# Patient Record
Sex: Female | Born: 1950 | Race: White | Hispanic: No | Marital: Married | Smoking: Former smoker
Health system: Southern US, Community
[De-identification: ages and names within clinical notes are randomized; demographics above are authoritative.]

## PROBLEM LIST (undated history)

## (undated) DIAGNOSIS — J690 Pneumonitis due to inhalation of food and vomit: Secondary | ICD-10-CM

## (undated) DIAGNOSIS — J9601 Acute respiratory failure with hypoxia: Secondary | ICD-10-CM

## (undated) DIAGNOSIS — J309 Allergic rhinitis, unspecified: Secondary | ICD-10-CM

## (undated) DIAGNOSIS — R413 Other amnesia: Secondary | ICD-10-CM

## (undated) DIAGNOSIS — M8580 Other specified disorders of bone density and structure, unspecified site: Secondary | ICD-10-CM

## (undated) DIAGNOSIS — F329 Major depressive disorder, single episode, unspecified: Secondary | ICD-10-CM

## (undated) DIAGNOSIS — E079 Disorder of thyroid, unspecified: Secondary | ICD-10-CM

## (undated) DIAGNOSIS — F332 Major depressive disorder, recurrent severe without psychotic features: Secondary | ICD-10-CM

## (undated) DIAGNOSIS — F1011 Alcohol abuse, in remission: Secondary | ICD-10-CM

## (undated) DIAGNOSIS — D72829 Elevated white blood cell count, unspecified: Secondary | ICD-10-CM

## (undated) DIAGNOSIS — R519 Headache, unspecified: Secondary | ICD-10-CM

## (undated) DIAGNOSIS — K5792 Diverticulitis of intestine, part unspecified, without perforation or abscess without bleeding: Secondary | ICD-10-CM

## (undated) DIAGNOSIS — D649 Anemia, unspecified: Secondary | ICD-10-CM

## (undated) DIAGNOSIS — D62 Acute posthemorrhagic anemia: Secondary | ICD-10-CM

## (undated) HISTORY — DX: Allergic rhinitis, unspecified: J30.9

## (undated) HISTORY — DX: Elevated white blood cell count, unspecified: D72.829

## (undated) HISTORY — DX: Other specified disorders of bone density and structure, unspecified site: M85.80

## (undated) HISTORY — PX: APPENDECTOMY: SHX54

## (undated) HISTORY — DX: Diverticulitis of intestine, part unspecified, without perforation or abscess without bleeding: K57.92

## (undated) HISTORY — DX: Alcohol abuse, in remission: F10.11

## (undated) HISTORY — DX: Major depressive disorder, recurrent severe without psychotic features: F33.2

## (undated) HISTORY — DX: Other amnesia: R41.3

## (undated) HISTORY — PX: TONSILLECTOMY: SUR1361

## (undated) HISTORY — DX: Pneumonitis due to inhalation of food and vomit: J69.0

## (undated) HISTORY — PX: GANGLION CYST EXCISION: SHX1691

## (undated) HISTORY — DX: Acute respiratory failure with hypoxia: J96.01

## (undated) HISTORY — PX: COLON RESECTION: SHX5231

## (undated) HISTORY — DX: Headache, unspecified: R51.9

## (undated) HISTORY — DX: Disorder of thyroid, unspecified: E07.9

## (undated) HISTORY — PX: COLONOSCOPY: SHX174

## (undated) HISTORY — DX: Major depressive disorder, single episode, unspecified: F32.9

---

## 1997-12-21 ENCOUNTER — Ambulatory Visit (HOSPITAL_COMMUNITY): Admission: RE | Admit: 1997-12-21 | Discharge: 1997-12-21 | Payer: Self-pay | Admitting: *Deleted

## 1997-12-26 ENCOUNTER — Ambulatory Visit (HOSPITAL_COMMUNITY): Admission: RE | Admit: 1997-12-26 | Discharge: 1997-12-26 | Payer: Self-pay | Admitting: *Deleted

## 1999-01-05 ENCOUNTER — Ambulatory Visit (HOSPITAL_COMMUNITY): Admission: RE | Admit: 1999-01-05 | Discharge: 1999-01-05 | Payer: Self-pay | Admitting: *Deleted

## 1999-01-12 ENCOUNTER — Other Ambulatory Visit: Admission: RE | Admit: 1999-01-12 | Discharge: 1999-01-12 | Payer: Self-pay | Admitting: *Deleted

## 1999-08-17 ENCOUNTER — Ambulatory Visit (HOSPITAL_BASED_OUTPATIENT_CLINIC_OR_DEPARTMENT_OTHER): Admission: RE | Admit: 1999-08-17 | Discharge: 1999-08-17 | Payer: Self-pay | Admitting: Orthopedic Surgery

## 2000-01-07 ENCOUNTER — Encounter: Payer: Self-pay | Admitting: *Deleted

## 2000-01-07 ENCOUNTER — Ambulatory Visit (HOSPITAL_COMMUNITY): Admission: RE | Admit: 2000-01-07 | Discharge: 2000-01-07 | Payer: Self-pay | Admitting: *Deleted

## 2000-01-28 ENCOUNTER — Other Ambulatory Visit: Admission: RE | Admit: 2000-01-28 | Discharge: 2000-01-28 | Payer: Self-pay | Admitting: *Deleted

## 2001-01-08 ENCOUNTER — Ambulatory Visit (HOSPITAL_COMMUNITY): Admission: RE | Admit: 2001-01-08 | Discharge: 2001-01-08 | Payer: Self-pay | Admitting: *Deleted

## 2001-01-08 ENCOUNTER — Encounter: Payer: Self-pay | Admitting: *Deleted

## 2001-01-28 ENCOUNTER — Other Ambulatory Visit: Admission: RE | Admit: 2001-01-28 | Discharge: 2001-01-28 | Payer: Self-pay | Admitting: *Deleted

## 2001-01-29 ENCOUNTER — Encounter: Payer: Self-pay | Admitting: Family Medicine

## 2001-01-29 ENCOUNTER — Encounter: Admission: RE | Admit: 2001-01-29 | Discharge: 2001-01-29 | Payer: Self-pay | Admitting: Family Medicine

## 2002-02-02 ENCOUNTER — Encounter: Payer: Self-pay | Admitting: *Deleted

## 2002-02-02 ENCOUNTER — Ambulatory Visit (HOSPITAL_COMMUNITY): Admission: RE | Admit: 2002-02-02 | Discharge: 2002-02-02 | Payer: Self-pay | Admitting: *Deleted

## 2002-03-25 ENCOUNTER — Other Ambulatory Visit: Admission: RE | Admit: 2002-03-25 | Discharge: 2002-03-25 | Payer: Self-pay | Admitting: *Deleted

## 2002-03-31 ENCOUNTER — Encounter: Admission: RE | Admit: 2002-03-31 | Discharge: 2002-03-31 | Payer: Self-pay | Admitting: *Deleted

## 2002-03-31 ENCOUNTER — Encounter: Payer: Self-pay | Admitting: *Deleted

## 2002-07-21 ENCOUNTER — Encounter: Payer: Self-pay | Admitting: Gastroenterology

## 2002-07-21 ENCOUNTER — Encounter: Admission: RE | Admit: 2002-07-21 | Discharge: 2002-07-21 | Payer: Self-pay | Admitting: Gastroenterology

## 2003-02-04 ENCOUNTER — Ambulatory Visit (HOSPITAL_COMMUNITY): Admission: RE | Admit: 2003-02-04 | Discharge: 2003-02-04 | Payer: Self-pay | Admitting: *Deleted

## 2003-02-04 ENCOUNTER — Encounter: Payer: Self-pay | Admitting: *Deleted

## 2003-04-06 ENCOUNTER — Other Ambulatory Visit: Admission: RE | Admit: 2003-04-06 | Discharge: 2003-04-06 | Payer: Self-pay | Admitting: Family Medicine

## 2004-03-14 ENCOUNTER — Ambulatory Visit (HOSPITAL_COMMUNITY): Admission: RE | Admit: 2004-03-14 | Discharge: 2004-03-14 | Payer: Self-pay | Admitting: Family Medicine

## 2004-04-11 ENCOUNTER — Other Ambulatory Visit: Admission: RE | Admit: 2004-04-11 | Discharge: 2004-04-11 | Payer: Self-pay | Admitting: Family Medicine

## 2004-05-21 ENCOUNTER — Emergency Department (HOSPITAL_COMMUNITY): Admission: EM | Admit: 2004-05-21 | Discharge: 2004-05-21 | Payer: Self-pay | Admitting: Emergency Medicine

## 2004-06-19 ENCOUNTER — Ambulatory Visit (HOSPITAL_COMMUNITY): Admission: RE | Admit: 2004-06-19 | Discharge: 2004-06-19 | Payer: Self-pay | Admitting: Gastroenterology

## 2005-03-19 ENCOUNTER — Ambulatory Visit (HOSPITAL_COMMUNITY): Admission: RE | Admit: 2005-03-19 | Discharge: 2005-03-19 | Payer: Self-pay | Admitting: Family Medicine

## 2005-05-01 ENCOUNTER — Other Ambulatory Visit: Admission: RE | Admit: 2005-05-01 | Discharge: 2005-05-01 | Payer: Self-pay | Admitting: Family Medicine

## 2005-08-09 ENCOUNTER — Ambulatory Visit (HOSPITAL_COMMUNITY): Admission: RE | Admit: 2005-08-09 | Discharge: 2005-08-09 | Payer: Self-pay | Admitting: Family Medicine

## 2005-12-11 ENCOUNTER — Encounter: Payer: Self-pay | Admitting: *Deleted

## 2006-03-24 ENCOUNTER — Ambulatory Visit (HOSPITAL_COMMUNITY): Admission: RE | Admit: 2006-03-24 | Discharge: 2006-03-24 | Payer: Self-pay | Admitting: Family Medicine

## 2006-09-03 ENCOUNTER — Other Ambulatory Visit: Admission: RE | Admit: 2006-09-03 | Discharge: 2006-09-03 | Payer: Self-pay | Admitting: Family Medicine

## 2006-09-09 ENCOUNTER — Ambulatory Visit (HOSPITAL_COMMUNITY): Admission: RE | Admit: 2006-09-09 | Discharge: 2006-09-09 | Payer: Self-pay | Admitting: Family Medicine

## 2007-03-27 ENCOUNTER — Ambulatory Visit (HOSPITAL_COMMUNITY): Admission: RE | Admit: 2007-03-27 | Discharge: 2007-03-27 | Payer: Self-pay | Admitting: Family Medicine

## 2007-04-02 ENCOUNTER — Encounter: Admission: RE | Admit: 2007-04-02 | Discharge: 2007-04-02 | Payer: Self-pay | Admitting: Family Medicine

## 2007-09-10 ENCOUNTER — Other Ambulatory Visit: Admission: RE | Admit: 2007-09-10 | Discharge: 2007-09-10 | Payer: Self-pay | Admitting: Family Medicine

## 2008-04-05 ENCOUNTER — Ambulatory Visit (HOSPITAL_COMMUNITY): Admission: RE | Admit: 2008-04-05 | Discharge: 2008-04-05 | Payer: Self-pay | Admitting: Family Medicine

## 2008-04-15 ENCOUNTER — Encounter: Admission: RE | Admit: 2008-04-15 | Discharge: 2008-04-15 | Payer: Self-pay | Admitting: Family Medicine

## 2008-10-26 ENCOUNTER — Other Ambulatory Visit: Admission: RE | Admit: 2008-10-26 | Discharge: 2008-10-26 | Payer: Self-pay | Admitting: Family Medicine

## 2009-05-16 ENCOUNTER — Ambulatory Visit (HOSPITAL_COMMUNITY): Admission: RE | Admit: 2009-05-16 | Discharge: 2009-05-16 | Payer: Self-pay | Admitting: Family Medicine

## 2010-06-11 ENCOUNTER — Ambulatory Visit (HOSPITAL_COMMUNITY): Admission: RE | Admit: 2010-06-11 | Discharge: 2010-06-11 | Payer: Self-pay | Admitting: Family Medicine

## 2010-09-01 ENCOUNTER — Encounter: Payer: Self-pay | Admitting: Sports Medicine

## 2010-09-02 ENCOUNTER — Encounter: Payer: Self-pay | Admitting: Family Medicine

## 2010-09-03 ENCOUNTER — Encounter: Payer: Self-pay | Admitting: Family Medicine

## 2010-12-28 NOTE — Op Note (Signed)
Courtney Beard, Courtney Beard          ACCOUNT NO.:  [id number]   MEDICAL RECORD NO.:  [id number]          PATIENT TYPE:  AMB   LOCATION:  ENDO                         FACILITY:  [facility]   PHYSICIAN:  Petra Kuba, M.D.    DATE OF BIRTH:  03/02/1951   DATE OF PROCEDURE:  06/19/2004  DATE OF DISCHARGE:                                 OPERATIVE REPORT   PROCEDURE:  Colonoscopy.   INDICATIONS:  Screening. Consent was signed after risks, benefits, methods,  and options thoroughly discussed in the office.   MEDICINES USED:  Fentanyl 100 mcg, Versed 10 mg.   PROCEDURE NOTE:  Rectal inspection pertinent for external hemorrhoids.  Digital exam was negative. Pediatric video adjustable colonoscope was  inserted and with lots of difficulty due to tortuous sigmoid. Once through  this area, was easily to advance around the colon to the colon end. 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 No other ways of  advancing were seen. We rolled her on her back and even on her right side to  try to see if that would open up the terminal ileum but were unsuccessful,  although there was no obvious other loop or curve to advance to. The scope  was slowly withdrawn. No abnormalities were seen other than a right left  sided diverticula. We slowly withdrew back to the rectum, and there was no  signs of any other anastomosis. She did have a very tortuous sigmoid which  made visualization difficult in the area. Anorectal pull through and  retroflexion confirmed some small hemorrhoids. The scope was straightened.  Air was suctioned. Scope removed. The patient tolerated the procedure well.  There were no obvious immediate complications.   ENDOSCOPIC DIAGNOSES:  1.  Internal and external hemorrhoids.  2.  Rare left sided diverticula.  3.  Tortuous sigmoid.  4.  Otherwise within normal limits to the colon end, we  believe, probably      was anastomosis, seemed to be some small bowel mucosa.   PLAN:  Yearly rectals and guaiacs per Dr. Katrinka Blazing. Happy to see back p.r.n.  Repeat screening needed in 5 years. Might consider a virtual colonoscopy at  that junction to better delineate the curves.      MEM/MEDQ  D:  06/19/2004  T:  06/19/2004  Job:  [id number]   cc:   Dario Guardian, M.D.  [address]  [place]  [place] [address]  Fax: [phone]

## 2011-05-31 ENCOUNTER — Other Ambulatory Visit (HOSPITAL_COMMUNITY): Payer: Self-pay | Admitting: Family Medicine

## 2011-05-31 DIAGNOSIS — Z1231 Encounter for screening mammogram for malignant neoplasm of breast: Secondary | ICD-10-CM

## 2011-06-26 ENCOUNTER — Ambulatory Visit (HOSPITAL_COMMUNITY): Payer: Self-pay

## 2011-06-27 ENCOUNTER — Ambulatory Visit (HOSPITAL_COMMUNITY): Payer: Self-pay

## 2012-10-06 ENCOUNTER — Other Ambulatory Visit (HOSPITAL_COMMUNITY): Payer: Self-pay | Admitting: Family Medicine

## 2012-10-06 DIAGNOSIS — Z1231 Encounter for screening mammogram for malignant neoplasm of breast: Secondary | ICD-10-CM

## 2012-10-14 ENCOUNTER — Ambulatory Visit (HOSPITAL_COMMUNITY)
Admission: RE | Admit: 2012-10-14 | Discharge: 2012-10-14 | Disposition: A | Discharge status: Home / Self Care | Payer: 59 | Source: Ambulatory Visit | Attending: Family Medicine | Admitting: Family Medicine

## 2012-10-14 DIAGNOSIS — Z1231 Encounter for screening mammogram for malignant neoplasm of breast: Secondary | ICD-10-CM

## 2013-08-16 ENCOUNTER — Other Ambulatory Visit: Payer: Self-pay | Admitting: Family Medicine

## 2013-08-16 ENCOUNTER — Other Ambulatory Visit (HOSPITAL_COMMUNITY)
Admission: RE | Admit: 2013-08-16 | Discharge: 2013-08-16 | Disposition: A | Discharge status: Home / Self Care | Payer: Managed Care, Other (non HMO) | Source: Ambulatory Visit | Attending: Family Medicine | Admitting: Family Medicine

## 2013-08-16 DIAGNOSIS — Z0000 Encounter for general adult medical examination without abnormal findings: Secondary | ICD-10-CM | POA: Insufficient documentation

## 2013-12-06 ENCOUNTER — Other Ambulatory Visit (HOSPITAL_COMMUNITY): Payer: Self-pay | Admitting: Family Medicine

## 2013-12-06 DIAGNOSIS — Z1231 Encounter for screening mammogram for malignant neoplasm of breast: Secondary | ICD-10-CM

## 2013-12-15 ENCOUNTER — Ambulatory Visit (HOSPITAL_COMMUNITY)
Admission: RE | Admit: 2013-12-15 | Discharge: 2013-12-15 | Disposition: A | Discharge status: Home / Self Care | Payer: Managed Care, Other (non HMO) | Source: Ambulatory Visit | Attending: Family Medicine | Admitting: Family Medicine

## 2013-12-15 ENCOUNTER — Other Ambulatory Visit (HOSPITAL_COMMUNITY): Payer: Self-pay | Admitting: Family Medicine

## 2013-12-15 DIAGNOSIS — Z1231 Encounter for screening mammogram for malignant neoplasm of breast: Secondary | ICD-10-CM

## 2014-11-30 ENCOUNTER — Other Ambulatory Visit (HOSPITAL_COMMUNITY): Payer: Self-pay | Admitting: Family Medicine

## 2014-11-30 DIAGNOSIS — Z1231 Encounter for screening mammogram for malignant neoplasm of breast: Secondary | ICD-10-CM

## 2014-12-20 ENCOUNTER — Ambulatory Visit (HOSPITAL_COMMUNITY)
Admission: RE | Admit: 2014-12-20 | Discharge: 2014-12-20 | Disposition: A | Discharge status: Home / Self Care | Payer: Managed Care, Other (non HMO) | Source: Ambulatory Visit | Attending: Family Medicine | Admitting: Family Medicine

## 2014-12-20 DIAGNOSIS — Z1231 Encounter for screening mammogram for malignant neoplasm of breast: Secondary | ICD-10-CM

## 2015-12-07 ENCOUNTER — Other Ambulatory Visit: Payer: Self-pay

## 2015-12-07 DIAGNOSIS — Z1231 Encounter for screening mammogram for malignant neoplasm of breast: Secondary | ICD-10-CM

## 2015-12-21 ENCOUNTER — Ambulatory Visit
Admission: RE | Admit: 2015-12-21 | Discharge: 2015-12-21 | Disposition: A | Discharge status: Home / Self Care | Payer: Managed Care, Other (non HMO) | Source: Ambulatory Visit

## 2015-12-21 DIAGNOSIS — Z1231 Encounter for screening mammogram for malignant neoplasm of breast: Secondary | ICD-10-CM

## 2016-12-10 ENCOUNTER — Other Ambulatory Visit (HOSPITAL_COMMUNITY)
Admission: RE | Admit: 2016-12-10 | Discharge: 2016-12-10 | Disposition: A | Discharge status: Home / Self Care | Payer: Managed Care, Other (non HMO) | Source: Ambulatory Visit | Attending: Family Medicine | Admitting: Family Medicine

## 2016-12-10 ENCOUNTER — Other Ambulatory Visit: Payer: Self-pay | Admitting: Family Medicine

## 2016-12-10 DIAGNOSIS — Z124 Encounter for screening for malignant neoplasm of cervix: Secondary | ICD-10-CM | POA: Insufficient documentation

## 2016-12-12 LAB — CYTOLOGY - PAP: Diagnosis: NEGATIVE

## 2016-12-20 ENCOUNTER — Other Ambulatory Visit: Payer: Self-pay | Admitting: Family Medicine

## 2016-12-20 DIAGNOSIS — Z1231 Encounter for screening mammogram for malignant neoplasm of breast: Secondary | ICD-10-CM

## 2016-12-25 ENCOUNTER — Ambulatory Visit
Admission: RE | Admit: 2016-12-25 | Discharge: 2016-12-25 | Disposition: A | Discharge status: Home / Self Care | Payer: Managed Care, Other (non HMO) | Source: Ambulatory Visit | Attending: Family Medicine | Admitting: Family Medicine

## 2016-12-25 DIAGNOSIS — Z1231 Encounter for screening mammogram for malignant neoplasm of breast: Secondary | ICD-10-CM

## 2017-12-04 ENCOUNTER — Other Ambulatory Visit: Payer: Self-pay | Admitting: Family Medicine

## 2017-12-04 DIAGNOSIS — Z1231 Encounter for screening mammogram for malignant neoplasm of breast: Secondary | ICD-10-CM

## 2017-12-30 ENCOUNTER — Ambulatory Visit
Admission: RE | Admit: 2017-12-30 | Discharge: 2017-12-30 | Disposition: A | Discharge status: Home / Self Care | Payer: Medicare HMO | Source: Ambulatory Visit | Attending: Family Medicine | Admitting: Family Medicine

## 2017-12-30 DIAGNOSIS — Z1231 Encounter for screening mammogram for malignant neoplasm of breast: Secondary | ICD-10-CM | POA: Diagnosis not present

## 2018-01-20 DIAGNOSIS — F419 Anxiety disorder, unspecified: Secondary | ICD-10-CM | POA: Diagnosis not present

## 2018-01-20 DIAGNOSIS — R69 Illness, unspecified: Secondary | ICD-10-CM | POA: Diagnosis not present

## 2018-04-02 DIAGNOSIS — R69 Illness, unspecified: Secondary | ICD-10-CM | POA: Diagnosis not present

## 2018-04-02 DIAGNOSIS — Z1389 Encounter for screening for other disorder: Secondary | ICD-10-CM | POA: Diagnosis not present

## 2018-04-02 DIAGNOSIS — Z23 Encounter for immunization: Secondary | ICD-10-CM | POA: Diagnosis not present

## 2018-04-02 DIAGNOSIS — E7800 Pure hypercholesterolemia, unspecified: Secondary | ICD-10-CM | POA: Diagnosis not present

## 2018-04-02 DIAGNOSIS — M859 Disorder of bone density and structure, unspecified: Secondary | ICD-10-CM | POA: Diagnosis not present

## 2018-04-02 DIAGNOSIS — Z0000 Encounter for general adult medical examination without abnormal findings: Secondary | ICD-10-CM | POA: Diagnosis not present

## 2018-05-19 DIAGNOSIS — R69 Illness, unspecified: Secondary | ICD-10-CM | POA: Diagnosis not present

## 2018-05-23 ENCOUNTER — Inpatient Hospital Stay (HOSPITAL_COMMUNITY): Payer: Medicare HMO

## 2018-05-23 ENCOUNTER — Emergency Department (HOSPITAL_COMMUNITY): Payer: Medicare HMO

## 2018-05-23 ENCOUNTER — Other Ambulatory Visit: Payer: Self-pay

## 2018-05-23 ENCOUNTER — Inpatient Hospital Stay (HOSPITAL_COMMUNITY)
Admission: EM | Admit: 2018-05-23 | Discharge: 2018-06-19 | DRG: 917 | Disposition: A | Discharge status: Rehabilitation Facility | Payer: Medicare HMO | Attending: Internal Medicine | Admitting: Internal Medicine

## 2018-05-23 DIAGNOSIS — F13239 Sedative, hypnotic or anxiolytic dependence with withdrawal, unspecified: Secondary | ICD-10-CM | POA: Diagnosis not present

## 2018-05-23 DIAGNOSIS — A419 Sepsis, unspecified organism: Secondary | ICD-10-CM | POA: Diagnosis not present

## 2018-05-23 DIAGNOSIS — G92 Toxic encephalopathy: Secondary | ICD-10-CM | POA: Diagnosis present

## 2018-05-23 DIAGNOSIS — F419 Anxiety disorder, unspecified: Secondary | ICD-10-CM | POA: Diagnosis present

## 2018-05-23 DIAGNOSIS — J9600 Acute respiratory failure, unspecified whether with hypoxia or hypercapnia: Secondary | ICD-10-CM | POA: Diagnosis not present

## 2018-05-23 DIAGNOSIS — E874 Mixed disorder of acid-base balance: Secondary | ICD-10-CM | POA: Diagnosis present

## 2018-05-23 DIAGNOSIS — E875 Hyperkalemia: Secondary | ICD-10-CM | POA: Diagnosis present

## 2018-05-23 DIAGNOSIS — J690 Pneumonitis due to inhalation of food and vomit: Secondary | ICD-10-CM

## 2018-05-23 DIAGNOSIS — F329 Major depressive disorder, single episode, unspecified: Secondary | ICD-10-CM | POA: Diagnosis not present

## 2018-05-23 DIAGNOSIS — J984 Other disorders of lung: Secondary | ICD-10-CM | POA: Diagnosis not present

## 2018-05-23 DIAGNOSIS — Z781 Physical restraint status: Secondary | ICD-10-CM

## 2018-05-23 DIAGNOSIS — G9341 Metabolic encephalopathy: Secondary | ICD-10-CM | POA: Diagnosis not present

## 2018-05-23 DIAGNOSIS — T50902A Poisoning by unspecified drugs, medicaments and biological substances, intentional self-harm, initial encounter: Secondary | ICD-10-CM | POA: Diagnosis not present

## 2018-05-23 DIAGNOSIS — Z0189 Encounter for other specified special examinations: Secondary | ICD-10-CM

## 2018-05-23 DIAGNOSIS — J9690 Respiratory failure, unspecified, unspecified whether with hypoxia or hypercapnia: Secondary | ICD-10-CM | POA: Diagnosis not present

## 2018-05-23 DIAGNOSIS — R404 Transient alteration of awareness: Secondary | ICD-10-CM | POA: Diagnosis not present

## 2018-05-23 DIAGNOSIS — T510X2A Toxic effect of ethanol, intentional self-harm, initial encounter: Secondary | ICD-10-CM | POA: Diagnosis present

## 2018-05-23 DIAGNOSIS — I469 Cardiac arrest, cause unspecified: Secondary | ICD-10-CM

## 2018-05-23 DIAGNOSIS — R0689 Other abnormalities of breathing: Secondary | ICD-10-CM | POA: Diagnosis not present

## 2018-05-23 DIAGNOSIS — R1310 Dysphagia, unspecified: Secondary | ICD-10-CM | POA: Diagnosis not present

## 2018-05-23 DIAGNOSIS — R4020 Unspecified coma: Secondary | ICD-10-CM | POA: Diagnosis not present

## 2018-05-23 DIAGNOSIS — Z452 Encounter for adjustment and management of vascular access device: Secondary | ICD-10-CM

## 2018-05-23 DIAGNOSIS — Z4682 Encounter for fitting and adjustment of non-vascular catheter: Secondary | ICD-10-CM | POA: Diagnosis not present

## 2018-05-23 DIAGNOSIS — I468 Cardiac arrest due to other underlying condition: Secondary | ICD-10-CM | POA: Diagnosis present

## 2018-05-23 DIAGNOSIS — R6521 Severe sepsis with septic shock: Secondary | ICD-10-CM | POA: Diagnosis present

## 2018-05-23 DIAGNOSIS — T17908S Unspecified foreign body in respiratory tract, part unspecified causing other injury, sequela: Secondary | ICD-10-CM

## 2018-05-23 DIAGNOSIS — R69 Illness, unspecified: Secondary | ICD-10-CM | POA: Diagnosis not present

## 2018-05-23 DIAGNOSIS — R918 Other nonspecific abnormal finding of lung field: Secondary | ICD-10-CM | POA: Diagnosis not present

## 2018-05-23 DIAGNOSIS — T428X2A Poisoning by antiparkinsonism drugs and other central muscle-tone depressants, intentional self-harm, initial encounter: Secondary | ICD-10-CM | POA: Diagnosis not present

## 2018-05-23 DIAGNOSIS — G931 Anoxic brain damage, not elsewhere classified: Secondary | ICD-10-CM | POA: Diagnosis not present

## 2018-05-23 DIAGNOSIS — R1312 Dysphagia, oropharyngeal phase: Secondary | ICD-10-CM | POA: Diagnosis not present

## 2018-05-23 DIAGNOSIS — D72829 Elevated white blood cell count, unspecified: Secondary | ICD-10-CM | POA: Diagnosis not present

## 2018-05-23 DIAGNOSIS — R000 Tachycardia, unspecified: Secondary | ICD-10-CM | POA: Diagnosis not present

## 2018-05-23 DIAGNOSIS — Y92003 Bedroom of unspecified non-institutional (private) residence as the place of occurrence of the external cause: Secondary | ICD-10-CM | POA: Diagnosis not present

## 2018-05-23 DIAGNOSIS — F10239 Alcohol dependence with withdrawal, unspecified: Secondary | ICD-10-CM | POA: Diagnosis not present

## 2018-05-23 DIAGNOSIS — E1165 Type 2 diabetes mellitus with hyperglycemia: Secondary | ICD-10-CM | POA: Diagnosis not present

## 2018-05-23 DIAGNOSIS — T424X2A Poisoning by benzodiazepines, intentional self-harm, initial encounter: Secondary | ICD-10-CM | POA: Diagnosis not present

## 2018-05-23 DIAGNOSIS — J95851 Ventilator associated pneumonia: Secondary | ICD-10-CM | POA: Diagnosis not present

## 2018-05-23 DIAGNOSIS — F1010 Alcohol abuse, uncomplicated: Secondary | ICD-10-CM | POA: Diagnosis not present

## 2018-05-23 DIAGNOSIS — D649 Anemia, unspecified: Secondary | ICD-10-CM | POA: Diagnosis present

## 2018-05-23 DIAGNOSIS — Y903 Blood alcohol level of 60-79 mg/100 ml: Secondary | ICD-10-CM | POA: Diagnosis present

## 2018-05-23 DIAGNOSIS — T1491XA Suicide attempt, initial encounter: Secondary | ICD-10-CM | POA: Diagnosis not present

## 2018-05-23 DIAGNOSIS — J189 Pneumonia, unspecified organism: Secondary | ICD-10-CM | POA: Diagnosis not present

## 2018-05-23 DIAGNOSIS — T50904A Poisoning by unspecified drugs, medicaments and biological substances, undetermined, initial encounter: Secondary | ICD-10-CM | POA: Diagnosis not present

## 2018-05-23 DIAGNOSIS — F332 Major depressive disorder, recurrent severe without psychotic features: Secondary | ICD-10-CM | POA: Diagnosis not present

## 2018-05-23 DIAGNOSIS — E878 Other disorders of electrolyte and fluid balance, not elsewhere classified: Secondary | ICD-10-CM | POA: Diagnosis present

## 2018-05-23 DIAGNOSIS — Z9114 Patient's other noncompliance with medication regimen: Secondary | ICD-10-CM

## 2018-05-23 DIAGNOSIS — E870 Hyperosmolality and hypernatremia: Secondary | ICD-10-CM | POA: Diagnosis not present

## 2018-05-23 DIAGNOSIS — T17908A Unspecified foreign body in respiratory tract, part unspecified causing other injury, initial encounter: Secondary | ICD-10-CM | POA: Diagnosis not present

## 2018-05-23 DIAGNOSIS — D62 Acute posthemorrhagic anemia: Secondary | ICD-10-CM

## 2018-05-23 DIAGNOSIS — R197 Diarrhea, unspecified: Secondary | ICD-10-CM | POA: Diagnosis not present

## 2018-05-23 DIAGNOSIS — R0602 Shortness of breath: Secondary | ICD-10-CM | POA: Diagnosis not present

## 2018-05-23 DIAGNOSIS — Z4659 Encounter for fitting and adjustment of other gastrointestinal appliance and device: Secondary | ICD-10-CM

## 2018-05-23 DIAGNOSIS — F19939 Other psychoactive substance use, unspecified with withdrawal, unspecified: Secondary | ICD-10-CM | POA: Diagnosis not present

## 2018-05-23 DIAGNOSIS — J80 Acute respiratory distress syndrome: Secondary | ICD-10-CM

## 2018-05-23 DIAGNOSIS — Z9911 Dependence on respirator [ventilator] status: Secondary | ICD-10-CM

## 2018-05-23 DIAGNOSIS — R0989 Other specified symptoms and signs involving the circulatory and respiratory systems: Secondary | ICD-10-CM

## 2018-05-23 DIAGNOSIS — E876 Hypokalemia: Secondary | ICD-10-CM | POA: Diagnosis present

## 2018-05-23 DIAGNOSIS — J9601 Acute respiratory failure with hypoxia: Secondary | ICD-10-CM

## 2018-05-23 DIAGNOSIS — R4189 Other symptoms and signs involving cognitive functions and awareness: Secondary | ICD-10-CM | POA: Diagnosis not present

## 2018-05-23 DIAGNOSIS — G4700 Insomnia, unspecified: Secondary | ICD-10-CM | POA: Diagnosis not present

## 2018-05-23 DIAGNOSIS — J181 Lobar pneumonia, unspecified organism: Secondary | ICD-10-CM | POA: Diagnosis not present

## 2018-05-23 DIAGNOSIS — K5900 Constipation, unspecified: Secondary | ICD-10-CM | POA: Diagnosis not present

## 2018-05-23 DIAGNOSIS — F1011 Alcohol abuse, in remission: Secondary | ICD-10-CM

## 2018-05-23 DIAGNOSIS — G9340 Encephalopathy, unspecified: Secondary | ICD-10-CM | POA: Diagnosis not present

## 2018-05-23 DIAGNOSIS — T50902S Poisoning by unspecified drugs, medicaments and biological substances, intentional self-harm, sequela: Secondary | ICD-10-CM | POA: Diagnosis not present

## 2018-05-23 DIAGNOSIS — T6591XA Toxic effect of unspecified substance, accidental (unintentional), initial encounter: Secondary | ICD-10-CM | POA: Diagnosis present

## 2018-05-23 DIAGNOSIS — T50901A Poisoning by unspecified drugs, medicaments and biological substances, accidental (unintentional), initial encounter: Secondary | ICD-10-CM | POA: Diagnosis not present

## 2018-05-23 DIAGNOSIS — Z881 Allergy status to other antibiotic agents status: Secondary | ICD-10-CM

## 2018-05-23 DIAGNOSIS — Z79899 Other long term (current) drug therapy: Secondary | ICD-10-CM

## 2018-05-23 HISTORY — DX: Acute posthemorrhagic anemia: D62

## 2018-05-23 HISTORY — DX: Alcohol abuse, in remission: F10.11

## 2018-05-23 HISTORY — DX: Anemia, unspecified: D64.9

## 2018-05-23 HISTORY — DX: Major depressive disorder, single episode, unspecified: F32.9

## 2018-05-23 HISTORY — DX: Poisoning by unspecified drugs, medicaments and biological substances, accidental (unintentional), initial encounter: T50.901A

## 2018-05-23 LAB — POCT I-STAT 3, ART BLOOD GAS (G3+)
Acid-base deficit: 10 mmol/L — ABNORMAL HIGH (ref 0.0–2.0)
Bicarbonate: 18.1 mmol/L — ABNORMAL LOW (ref 20.0–28.0)
O2 Saturation: 96 %
PH ART: 7.213 — AB (ref 7.350–7.450)
TCO2: 20 mmol/L — AB (ref 22–32)
pCO2 arterial: 43.8 mmHg (ref 32.0–48.0)
pO2, Arterial: 88 mmHg (ref 83.0–108.0)

## 2018-05-23 LAB — URINALYSIS, ROUTINE W REFLEX MICROSCOPIC
BILIRUBIN URINE: NEGATIVE
Glucose, UA: NEGATIVE mg/dL
Ketones, ur: NEGATIVE mg/dL
Leukocytes, UA: NEGATIVE
NITRITE: NEGATIVE
PH: 5 (ref 5.0–8.0)
Protein, ur: NEGATIVE mg/dL
SPECIFIC GRAVITY, URINE: 1.016 (ref 1.005–1.030)

## 2018-05-23 LAB — I-STAT ARTERIAL BLOOD GAS, ED
ACID-BASE DEFICIT: 10 mmol/L — AB (ref 0.0–2.0)
Bicarbonate: 17.7 mmol/L — ABNORMAL LOW (ref 20.0–28.0)
O2 SAT: 100 %
TCO2: 19 mmol/L — AB (ref 22–32)
pCO2 arterial: 44.5 mmHg (ref 32.0–48.0)
pH, Arterial: 7.201 — ABNORMAL LOW (ref 7.350–7.450)
pO2, Arterial: 208 mmHg — ABNORMAL HIGH (ref 83.0–108.0)

## 2018-05-23 LAB — I-STAT TROPONIN, ED: TROPONIN I, POC: 0.04 ng/mL (ref 0.00–0.08)

## 2018-05-23 LAB — COMPREHENSIVE METABOLIC PANEL
ALT: 17 U/L (ref 0–44)
AST: 28 U/L (ref 15–41)
Albumin: 2.5 g/dL — ABNORMAL LOW (ref 3.5–5.0)
Alkaline Phosphatase: 50 U/L (ref 38–126)
Anion gap: 10 (ref 5–15)
BILIRUBIN TOTAL: 0.4 mg/dL (ref 0.3–1.2)
BUN: 13 mg/dL (ref 8–23)
CO2: 20 mmol/L — ABNORMAL LOW (ref 22–32)
Calcium: 7.5 mg/dL — ABNORMAL LOW (ref 8.9–10.3)
Chloride: 112 mmol/L — ABNORMAL HIGH (ref 98–111)
Creatinine, Ser: 0.81 mg/dL (ref 0.44–1.00)
Glucose, Bld: 111 mg/dL — ABNORMAL HIGH (ref 70–99)
POTASSIUM: 2.4 mmol/L — AB (ref 3.5–5.1)
Sodium: 142 mmol/L (ref 135–145)
TOTAL PROTEIN: 4.5 g/dL — AB (ref 6.5–8.1)

## 2018-05-23 LAB — CBC WITH DIFFERENTIAL/PLATELET
Abs Immature Granulocytes: 0 10*3/uL (ref 0.00–0.07)
Basophils Absolute: 0 10*3/uL (ref 0.0–0.1)
Basophils Relative: 0 %
EOS PCT: 1 %
Eosinophils Absolute: 0 10*3/uL (ref 0.0–0.5)
HEMATOCRIT: 30.9 % — AB (ref 36.0–46.0)
Hemoglobin: 9.7 g/dL — ABNORMAL LOW (ref 12.0–15.0)
Immature Granulocytes: 0 %
LYMPHS ABS: 0.5 10*3/uL — AB (ref 0.7–4.0)
Lymphocytes Relative: 17 %
MCH: 25.9 pg — AB (ref 26.0–34.0)
MCHC: 31.4 g/dL (ref 30.0–36.0)
MCV: 82.4 fL (ref 80.0–100.0)
MONO ABS: 0 10*3/uL — AB (ref 0.1–1.0)
MONOS PCT: 1 %
Neutro Abs: 2.3 10*3/uL (ref 1.7–7.7)
Neutrophils Relative %: 81 %
Platelets: 245 10*3/uL (ref 150–400)
RBC: 3.75 MIL/uL — ABNORMAL LOW (ref 3.87–5.11)
RDW: 16.7 % — AB (ref 11.5–15.5)
WBC: 2.8 10*3/uL — ABNORMAL LOW (ref 4.0–10.5)
nRBC: 0 % (ref 0.0–0.2)

## 2018-05-23 LAB — BASIC METABOLIC PANEL
ANION GAP: 6 (ref 5–15)
BUN: 13 mg/dL (ref 8–23)
CALCIUM: 7.1 mg/dL — AB (ref 8.9–10.3)
CO2: 22 mmol/L (ref 22–32)
CREATININE: 0.84 mg/dL (ref 0.44–1.00)
Chloride: 113 mmol/L — ABNORMAL HIGH (ref 98–111)
Glucose, Bld: 129 mg/dL — ABNORMAL HIGH (ref 70–99)
Potassium: 4.3 mmol/L (ref 3.5–5.1)
SODIUM: 141 mmol/L (ref 135–145)

## 2018-05-23 LAB — LACTIC ACID, PLASMA
Lactic Acid, Venous: 2.5 mmol/L (ref 0.5–1.9)
Lactic Acid, Venous: 1.4 mmol/L (ref 0.5–1.9)

## 2018-05-23 LAB — HIV ANTIBODY (ROUTINE TESTING W REFLEX): HIV SCREEN 4TH GENERATION: NONREACTIVE

## 2018-05-23 LAB — AMMONIA: Ammonia: 36 umol/L — ABNORMAL HIGH (ref 9–35)

## 2018-05-23 LAB — RAPID URINE DRUG SCREEN, HOSP PERFORMED
Amphetamines: NOT DETECTED
BARBITURATES: NOT DETECTED
Benzodiazepines: POSITIVE — AB
Cocaine: NOT DETECTED
Opiates: NOT DETECTED
Tetrahydrocannabinol: NOT DETECTED

## 2018-05-23 LAB — GLUCOSE, CAPILLARY
GLUCOSE-CAPILLARY: 89 mg/dL (ref 70–99)
GLUCOSE-CAPILLARY: 110 mg/dL — AB (ref 70–99)
Glucose-Capillary: 137 mg/dL — ABNORMAL HIGH (ref 70–99)
Glucose-Capillary: 137 mg/dL — ABNORMAL HIGH (ref 70–99)

## 2018-05-23 LAB — TROPONIN I
TROPONIN I: 0.03 ng/mL — AB (ref <0.03)
Troponin I: <0.03 ng/mL (ref <0.03)
Troponin I: <0.03 ng/mL (ref <0.03)

## 2018-05-23 LAB — I-STAT CG4 LACTIC ACID, ED: LACTIC ACID, VENOUS: 3.21 mmol/L — AB (ref 0.5–1.9)

## 2018-05-23 LAB — PROTIME-INR
INR: 1.06
PROTHROMBIN TIME: 13.7 s (ref 11.4–15.2)

## 2018-05-23 LAB — PROCALCITONIN

## 2018-05-23 LAB — ETHANOL: Alcohol, Ethyl (B): 63 mg/dL — ABNORMAL HIGH (ref <10)

## 2018-05-23 LAB — CK: CK TOTAL: 508 U/L — AB (ref 38–234)

## 2018-05-23 LAB — CORTISOL: Cortisol, Plasma: 17.2 ug/dL

## 2018-05-23 LAB — ACETAMINOPHEN LEVEL: Acetaminophen (Tylenol), Serum: <10 ug/mL — ABNORMAL LOW (ref 10–30)

## 2018-05-23 LAB — SALICYLATE LEVEL

## 2018-05-23 LAB — ECHOCARDIOGRAM COMPLETE
HEIGHTINCHES: 62 in
Weight: 2080 oz

## 2018-05-23 LAB — MRSA PCR SCREENING: MRSA by PCR: NEGATIVE

## 2018-05-23 LAB — MAGNESIUM: Magnesium: 1.4 mg/dL — ABNORMAL LOW (ref 1.7–2.4)

## 2018-05-23 MED ORDER — SUCCINYLCHOLINE CHLORIDE 20 MG/ML IJ SOLN
100.0000 mg | Freq: Once | INTRAMUSCULAR | Status: AC
  Administered 2018-05-23: 100 mg via INTRAVENOUS

## 2018-05-23 MED ORDER — HEPARIN SODIUM (PORCINE) 5000 UNIT/ML IJ SOLN
5000.0000 [IU] | Freq: Three times a day (TID) | INTRAMUSCULAR | Status: DC
  Administered 2018-05-23 – 2018-06-19 (×81): 5000 [IU] via SUBCUTANEOUS
  Filled 2018-05-23 (×82): qty 1

## 2018-05-23 MED ORDER — ACETAMINOPHEN 160 MG/5ML PO SOLN
650.0000 mg | Freq: Once | ORAL | Status: AC
  Administered 2018-05-23: 650 mg
  Filled 2018-05-23: qty 20.3

## 2018-05-23 MED ORDER — SODIUM BICARBONATE 8.4 % IV SOLN
50.0000 meq | Freq: Once | INTRAVENOUS | Status: AC
  Administered 2018-05-23: 50 meq via INTRAVENOUS

## 2018-05-23 MED ORDER — SODIUM BICARBONATE 8.4 % IV SOLN
INTRAVENOUS | Status: DC
  Administered 2018-05-23: 10:00:00 via INTRAVENOUS
  Filled 2018-05-23 (×2): qty 150

## 2018-05-23 MED ORDER — VECURONIUM BROMIDE 10 MG IV SOLR: 5.0000 mg | Freq: Once | INTRAVENOUS | Status: DC

## 2018-05-23 MED ORDER — ORAL CARE MOUTH RINSE
15.0000 mL | OROMUCOSAL | Status: DC
  Administered 2018-05-23 – 2018-06-08 (×152): 15 mL via OROMUCOSAL

## 2018-05-23 MED ORDER — VECURONIUM BROMIDE 10 MG IV SOLR: 20.0000 mg | Freq: Once | INTRAVENOUS | Status: DC

## 2018-05-23 MED ORDER — ETOMIDATE 2 MG/ML IV SOLN
20.0000 mg | Freq: Once | INTRAVENOUS | Status: AC
  Administered 2018-05-23: 20 mg via INTRAVENOUS

## 2018-05-23 MED ORDER — SODIUM CHLORIDE 0.9 % IV SOLN
INTRAVENOUS | Status: DC | PRN
  Administered 2018-05-27 – 2018-05-28 (×2): via INTRA_ARTERIAL

## 2018-05-23 MED ORDER — POTASSIUM CHLORIDE 20 MEQ/15ML (10%) PO SOLN: 40.0000 meq | Freq: Once | ORAL | Status: DC

## 2018-05-23 MED ORDER — SODIUM CHLORIDE 0.9 % IV BOLUS
500.0000 mL | Freq: Once | INTRAVENOUS | Status: AC
  Administered 2018-05-23: 500 mL via INTRAVENOUS

## 2018-05-23 MED ORDER — THIAMINE HCL 100 MG/ML IJ SOLN
100.0000 mg | Freq: Every day | INTRAMUSCULAR | Status: DC
  Administered 2018-05-23 – 2018-05-26 (×4): 100 mg via INTRAVENOUS
  Filled 2018-05-23 (×4): qty 2

## 2018-05-23 MED ORDER — CHLORHEXIDINE GLUCONATE 0.12% ORAL RINSE (MEDLINE KIT)
15.0000 mL | Freq: Two times a day (BID) | OROMUCOSAL | Status: DC
  Administered 2018-05-23 – 2018-06-08 (×32): 15 mL via OROMUCOSAL

## 2018-05-23 MED ORDER — MIDAZOLAM HCL 2 MG/2ML IJ SOLN
INTRAMUSCULAR | Status: AC
  Administered 2018-05-23: 2 mg via INTRAVENOUS
  Filled 2018-05-23: qty 2

## 2018-05-23 MED ORDER — PANTOPRAZOLE SODIUM 40 MG PO PACK
40.0000 mg | PACK | Freq: Every day | ORAL | Status: DC
  Administered 2018-05-23 – 2018-06-08 (×17): 40 mg
  Filled 2018-05-23 (×18): qty 20

## 2018-05-23 MED ORDER — VITAL HIGH PROTEIN PO LIQD: 1000.0000 mL | ORAL | Status: DC

## 2018-05-23 MED ORDER — SODIUM CHLORIDE 0.9 % IV BOLUS
1000.0000 mL | Freq: Once | INTRAVENOUS | Status: AC
  Administered 2018-05-23: 1000 mL via INTRAVENOUS

## 2018-05-23 MED ORDER — SODIUM CHLORIDE 0.9 % IV SOLN
250.0000 mL | INTRAVENOUS | Status: DC
  Administered 2018-05-23 – 2018-05-27 (×2): 250 mL via INTRAVENOUS

## 2018-05-23 MED ORDER — VITAL AF 1.2 CAL PO LIQD
1000.0000 mL | ORAL | Status: DC
  Administered 2018-05-23 – 2018-05-24 (×2): 1000 mL
  Administered 2018-05-29: 1500 mL
  Administered 2018-05-30: 55 mL
  Administered 2018-05-31 – 2018-06-07 (×4): 1000 mL
  Filled 2018-05-23 (×3): qty 1000

## 2018-05-23 MED ORDER — ETOMIDATE 2 MG/ML IV SOLN
INTRAVENOUS | Status: AC | PRN
  Administered 2018-05-23: 20 mg via INTRAVENOUS

## 2018-05-23 MED ORDER — SODIUM BICARBONATE 8.4 % IV SOLN
INTRAVENOUS | Status: AC
  Filled 2018-05-23: qty 50

## 2018-05-23 MED ORDER — NOREPINEPHRINE 4 MG/250ML-% IV SOLN
0.0000 ug/min | INTRAVENOUS | Status: DC
  Administered 2018-05-23: 10 ug/min via INTRAVENOUS

## 2018-05-23 MED ORDER — POTASSIUM CHLORIDE 10 MEQ/100ML IV SOLN
10.0000 meq | INTRAVENOUS | Status: AC
  Administered 2018-05-23 (×2): 10 meq via INTRAVENOUS
  Filled 2018-05-23 (×2): qty 100

## 2018-05-23 MED ORDER — FOLIC ACID 5 MG/ML IJ SOLN
1.0000 mg | Freq: Every day | INTRAMUSCULAR | Status: DC
  Administered 2018-05-23 – 2018-05-26 (×4): 1 mg via INTRAVENOUS
  Filled 2018-05-23 (×5): qty 0.2

## 2018-05-23 MED ORDER — POTASSIUM CHLORIDE 20 MEQ/15ML (10%) PO SOLN
40.0000 meq | ORAL | Status: AC
  Administered 2018-05-23 (×2): 40 meq
  Filled 2018-05-23 (×2): qty 30

## 2018-05-23 MED ORDER — MIDAZOLAM HCL 2 MG/2ML IJ SOLN: 1.0000 mg | INTRAMUSCULAR | Status: DC | PRN

## 2018-05-23 MED ORDER — MIDAZOLAM HCL 2 MG/2ML IJ SOLN
2.0000 mg | Freq: Once | INTRAMUSCULAR | Status: AC
  Administered 2018-05-23: 2 mg via INTRAVENOUS

## 2018-05-23 MED ORDER — MIDAZOLAM HCL 2 MG/2ML IJ SOLN
1.0000 mg | INTRAMUSCULAR | Status: DC | PRN
  Administered 2018-05-23 (×2): 1 mg via INTRAVENOUS
  Filled 2018-05-23: qty 2

## 2018-05-23 MED ORDER — NOREPINEPHRINE 4 MG/250ML-% IV SOLN
2.0000 ug/min | INTRAVENOUS | Status: DC
  Administered 2018-05-23 (×3): 10 ug/min via INTRAVENOUS
  Filled 2018-05-23 (×2): qty 250

## 2018-05-23 MED ORDER — SODIUM CHLORIDE 0.9 % IV BOLUS
2000.0000 mL | Freq: Once | INTRAVENOUS | Status: AC
  Administered 2018-05-23: 2000 mL via INTRAVENOUS

## 2018-05-23 MED ORDER — PIPERACILLIN-TAZOBACTAM 3.375 G IVPB
3.3750 g | Freq: Three times a day (TID) | INTRAVENOUS | Status: DC
  Administered 2018-05-23 – 2018-05-26 (×10): 3.375 g via INTRAVENOUS
  Filled 2018-05-23 (×11): qty 50

## 2018-05-23 MED ORDER — NOREPINEPHRINE 4 MG/250ML-% IV SOLN
0.0000 ug/min | Freq: Once | INTRAVENOUS | Status: AC
  Administered 2018-05-23: 2 ug/min via INTRAVENOUS

## 2018-05-23 MED ORDER — MAGNESIUM SULFATE 2 GM/50ML IV SOLN
2.0000 g | Freq: Once | INTRAVENOUS | Status: AC
  Administered 2018-05-23: 2 g via INTRAVENOUS
  Filled 2018-05-23: qty 50

## 2018-05-23 MED ORDER — FENTANYL 2500MCG IN NS 250ML (10MCG/ML) PREMIX INFUSION
0.0000 ug/h | INTRAVENOUS | Status: DC
  Administered 2018-05-23: 25 ug/h via INTRAVENOUS
  Administered 2018-05-24: 225 ug/h via INTRAVENOUS
  Administered 2018-05-25: 150 ug/h via INTRAVENOUS
  Administered 2018-05-26: 50 ug/h via INTRAVENOUS
  Administered 2018-05-27: 150 ug/h via INTRAVENOUS
  Administered 2018-05-28: 250 ug/h via INTRAVENOUS
  Administered 2018-05-28: 275 ug/h via INTRAVENOUS
  Filled 2018-05-23 (×7): qty 250

## 2018-05-23 MED ORDER — SODIUM CHLORIDE 0.9 % IV SOLN
INTRAVENOUS | Status: DC
  Administered 2018-05-23 – 2018-05-25 (×5): via INTRAVENOUS

## 2018-05-23 MED ORDER — LACTATED RINGERS IV BOLUS
1000.0000 mL | Freq: Once | INTRAVENOUS | Status: AC
  Administered 2018-05-23: 1000 mL via INTRAVENOUS

## 2018-05-23 MED ORDER — NOREPINEPHRINE 4 MG/250ML-% IV SOLN
0.0000 ug/min | INTRAVENOUS | Status: DC
  Administered 2018-05-23: 15 ug/min via INTRAVENOUS
  Administered 2018-05-24: 29 ug/min via INTRAVENOUS
  Administered 2018-05-24: 40 ug/min via INTRAVENOUS
  Administered 2018-05-24: 16 ug/min via INTRAVENOUS
  Filled 2018-05-23 (×2): qty 250

## 2018-05-23 MED ORDER — FENTANYL CITRATE (PF) 100 MCG/2ML IJ SOLN
200.0000 ug | Freq: Once | INTRAMUSCULAR | Status: AC
  Administered 2018-05-23: 200 ug via INTRAVENOUS
  Filled 2018-05-23: qty 4

## 2018-05-23 MED ORDER — SUCCINYLCHOLINE CHLORIDE 20 MG/ML IJ SOLN
INTRAMUSCULAR | Status: AC | PRN
  Administered 2018-05-23: 100 mg via INTRAVENOUS

## 2018-05-23 MED ORDER — MIDAZOLAM HCL 2 MG/2ML IJ SOLN
1.0000 mg | INTRAMUSCULAR | Status: DC | PRN
  Administered 2018-05-23 – 2018-05-24 (×6): 2 mg via INTRAVENOUS
  Filled 2018-05-23 (×8): qty 2

## 2018-05-23 NOTE — ED Triage Notes (Addendum)
[facility] reports the pt had no pulses on arrival by fire. Fire started CPR and fire got pulses back. EMS advised the husband states pt has suicidal ideations and drank ETOH this date. EMS had empty pill bottles with them, zenaflex and ativan, both recently filled. There were (30) 0.5mg  Clonazepam and (60) 4mg  Zanaflex missing from their bottles, both filled within the last day. EMS IV's as noted. EMS reports vital signs of 80's/40's. Pt has copious amounts of vomitus about her.

## 2018-05-23 NOTE — H&P (Addendum)
NAME:  Courtney Beard, MRN:  [id number], DOB:  [birth year], LOS: 0 ADMISSION DATE:  05/23/2018, CONSULTATION DATE:  10/12 REFERRING MD:  Dr. Blinda Leatherwood, CHIEF COMPLAINT:  Overdose    History Present Illness   67 year old female presents s/p cardiac arrest. Husband reports that patient went to the gym at 0830 10/11, he then went to work, after work he came home had a few shots and then went upstairs around 0200 when he found the patient covered in emesis unresponsive with no palpable pulse with empty bottles of Klonopin and Zanaflex. Husband got patient out of bed to the floor (reports she hit her head during this time) and started CPR. When EMS arrived patient had a pulse. BP 80/40. On arrival to ED patient remained unresponsive, was intubated with difficulty, swelling noted around vocal cords. Given 1L Bolus. Started on Levophed gtt. PCCM asked to admit.    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 To his knowledge she has never attempted or wanted to attempt to harm herself. However since retiring she has voiced feeling un-needed and useless.   Past Medical History  Anxiety, Depression, ETOH   Significant Hospital Events   10-12 > Presents to ED   Consults: date of consult/date signed off & final recs:  PCCM  Procedures (surgical and bedside):  ETT 10/12 >>  Significant Diagnostic Tests:  CXR 10/12 > 1. Endotracheal tube tip in the proximal right mainstem bronchus. Retraction by 4 cm recommended. 2. Nasogastric tube side port below the diaphragm but not clearly visualized. Dedicated abdominal radiograph may be helpful. 3. Large area of right parahilar and medial right lung apex consolidation which may indicate infection or neoplastic process. Chest CT or Followup PA and lateral chest X-ray is recommended in 3-4 weeks following trial of antibiotic therapy to ensure  resolution and exclude underlying malignancy. CT Head/C-Spine 10/12 > 1. No acute intracranial abnormality. 2. No acute fracture of the cervical spine. 3. Large area of right upper lobe consolidation, possibly secondary to aspiration.  Micro Data:  Blood 10/12 >> Sputum 10/12 >>  U/A 10/12 >>   Antimicrobials:  Zosyn 10/12 >>    Subjective:    Objective   Blood pressure 102/62, pulse 87, resp. rate (!) 30, height 5\' 2"  (1.575 m), weight 59 kg, SpO2 93 %.    Vent Mode: PRVC FiO2 (%):  [50 %-100 %] 50 % Set Rate:  [18 bmp-20 bmp] 20 bmp Vt Set:  [400 mL] 400 mL PEEP:  [5 cmH20] 5 cmH20   Intake/Output Summary (Last 24 hours) at 05/23/2018 0631 Last data filed at 05/23/2018 0600 Gross per 24 hour  Intake 3000 ml  Output 260 ml  Net 2740 ml   Filed Weights   05/23/18 0520  Weight: 59 kg    Examination: General: Adult female, no distress  HENT: ETT in place  Lungs: Coarse breath sounds to right, no wheeze/crackles  Cardiovascular: RRR, no MRG  Abdomen: soft, non-distended  Extremities: -edema  Neuro: Alert, following commands GU: Foley in place   Resolved Hospital Problem list     Assessment & Plan:   Acute Hypoxic Respiratory Distress in setting of Aspiration PNA CXR with large area of right parahilar and medial right lung apex consolidation  Plan  -Vent Support -Trend ABG/CXR -Pulmonary Hygiene  -VAP Bundle   Septic Shock  Questionable Cardiac Arrest due to Overdose  Prolonged QTC -Husband was unable to palpate pulse, when EMS arrived patient was hypotension with pulse  Plan  -Cardiac Monitoring  -Trend Troponin  -ECHO pending  -Titrate Levophed to maintain MAP >65  -Trend EKG   Hypokalemia  Non-Gap Metabolic Acidosis with Lactic Acidosis Plan  -Trend BMP -Trend LA  -Replace electrolytes as indicated  -Given 2L Bolus, Giving additional 1L Now, followed by NS @ 100 ml/hr   Aspiration PNA  Plan  -Trend WBC and Fever Curve -Trend PCT and  LA -PAN Culture -Zosyn   Overdose  -Acetaminophen <10 -Salicylate <7 -ETOH 63  -CT Head/C-Spine negative  H/O Anxiety/Depression, ETOH  Plan  -On arrival to ICU patient alert and following commands -Will need psych consult and safety sitter once extubated  -Follow up with poison control  -UDS pending  -Folic Acid/Thimaine  -Titrate Fentanyl gtt to achieve RASS 0/-1, PRN Versed   Disposition / Summary of Today's Plan 05/23/18   Follow up on labs. Wean Vent as able.     Diet: NPO Pain/Anxiety/Delirium protocol  VAP protocol DVT prophylaxis: Heparin  GI prophylaxis: PPI Hyperglycemia protocol Mobility: Bedrest  Code Status: Full Code  Family Communication: Husband updated at bedside   Labs   CBC: Recent Labs  Lab 05/23/18 0555  WBC 2.8*  NEUTROABS 2.3  HGB 9.7*  HCT 30.9*  MCV 82.4  PLT 245    Basic Metabolic Panel: No results for input(s): NA, K, CL, CO2, GLUCOSE, BUN, CREATININE, CALCIUM, MG, PHOS in the last 168 hours. GFR: CrCl cannot be calculated (No successful lab value found.). Recent Labs  Lab 05/23/18 0502 05/23/18 0555  WBC  --  2.8*  LATICACIDVEN 3.21*  --     Liver Function Tests: No results for input(s): AST, ALT, ALKPHOS, BILITOT, PROT, ALBUMIN in the last 168 hours. No results for input(s): LIPASE, AMYLASE in the last 168 hours. No results for input(s): AMMONIA in the last 168 hours.  ABG    Component Value Date/Time   PHART 7.201 (L) 05/23/2018 0513   PCO2ART 44.5 05/23/2018 0513   PO2ART 208.0 (H) 05/23/2018 0513   HCO3 17.7 (L) 05/23/2018 0513   TCO2 19 (L) 05/23/2018 0513   ACIDBASEDEF 10.0 (H) 05/23/2018 0513   O2SAT 100.0 05/23/2018 0513     Coagulation Profile: Recent Labs  Lab 05/23/18 0555  INR 1.06    Cardiac Enzymes: No results for input(s): CKTOTAL, CKMB, CKMBINDEX, TROPONINI in the last 168 hours.  HbA1C: No results found for: HGBA1C  CBG: No results for input(s): GLUCAP in the last 168  hours.  Admitting History of Present Illness.   As above   Review of Systems:   Unable to review as patient is intubated/sedated   Past Medical History  She,  has no past medical history on file.   Surgical History   No past surgical history on file.   Social History   Social History   Socioeconomic History  . Marital status: Married    Spouse name: Not on file  . Number of children: Not on file  . Years of education: Not on file  . Highest education level: Not on file  Occupational History  . Not on file  Social Needs  . Financial resource strain: Not on file  . Food insecurity:    Worry: Not on file    Inability: Not on file  . Transportation needs:    Medical: Not on file    Non-medical: Not on file  Tobacco Use  .  Smoking status: Not on file  Substance and Sexual Activity  . Alcohol use: Not on file  . Drug use: Not on file  . Sexual activity: Not on file  Lifestyle  . Physical activity:    Days per week: Not on file    Minutes per session: Not on file  . Stress: Not on file  Relationships  . Social connections:    Talks on phone: Not on file    Gets together: Not on file    Attends religious service: Not on file    Active member of club or organization: Not on file    Attends meetings of clubs or organizations: Not on file    Relationship status: Not on file  . Intimate partner violence:    Fear of current or ex partner: Not on file    Emotionally abused: Not on file    Physically abused: Not on file    Forced sexual activity: Not on file  Other Topics Concern  . Not on file  Social History Narrative  . Not on file  ,     Family History   Her family history is not on file.   Allergies Not on File   Home Medications  Prior to Admission medications   Not on File     Critical care time: 62 minutes     Jovita Kussmaul, AGACNP-BC [facility]  PCCM Pgr: [phone]

## 2018-05-23 NOTE — Progress Notes (Signed)
  Echocardiogram 2D Echocardiogram has been performed.  Courtney Beard 05/23/2018, 8:50 AM

## 2018-05-23 NOTE — Progress Notes (Signed)
Pharmacy Antibiotic Note  Courtney Beard is a 67 y.o. female admitted on 05/23/2018 with Overdose/possible aspiration PNA.  Pharmacy has been consulted for Zosyn dosing. WBC 2.8. Renal function OK.   Plan: Zosyn 3.375G IV q8h to be infused over 4 hours Trend WBC, temp, renal function  F/U infectious work-up  Height: 5\' 2"  (157.5 cm) Weight: 130 lb (59 kg) IBW/kg (Calculated) : 50.1  No data recorded.  Recent Labs  Lab 05/23/18 0502  LATICACIDVEN 3.21*    CrCl cannot be calculated (No successful lab value found.).    Allergies not on file   Abran Duke 05/23/2018 6:04 AM

## 2018-05-23 NOTE — ED Provider Notes (Signed)
[name] Provider Note   CSN: [id number] Arrival date & time: 05/23/18  0411     History   Chief Complaint Chief Complaint  Patient presents with  . Post Arrest    HPI Courtney Beard is a 67 y.o. female.  Patient brought to the emergency department after CPR arrest.  Patient's husband came home from work and found her in the bed, covered in vomit.  He had difficulty finding pulses.  He called 911 and they told him to initiate CPR.  First responders continued CPR at arrival and then EMS arrived 2 minutes later and found that she was spontaneously breathing and had return of circulation.  Downtime was estimated 5 to 10 minutes at most.  EMS attempted intubations multiple times.  Patient had vomited prior to their arrival and continued to vomit during transport.  Patient's husband husband was on the scene.  He checked the medications and found that her Zanaflex and Klonopin bottles were empty.  He estimates that they should have each had 25 to 30 tablets.   Level V Caveat due to acuity.     No past medical history on file.  Patient Active Problem List   Diagnosis Date Noted  . Overdose 05/23/2018    No past surgical history on file.   OB History   None      Home Medications    Prior to Admission medications   Not on File    Family History No family history on file.  Social History Social History   Tobacco Use  . Smoking status: Not on file  Substance Use Topics  . Alcohol use: Not on file  . Drug use: Not on file     Allergies   Patient has no allergy information on record.   Review of Systems Review of Systems  Unable to perform ROS: Acuity of condition     Physical Exam Updated Vital Signs BP 95/66 (BP Location: Right Arm)   Pulse 89   Temp (!) 94.3 F (34.6 C) (Bladder)   Resp (!) 28   Ht 5\' 2"  (1.575 m)   Wt 59 kg   SpO2 97%   BMI 23.78 kg/m   Physical Exam  Constitutional: She appears  well-developed and well-nourished.  HENT:  Head: Atraumatic.  Neck: Neck supple.  Cardiovascular: Normal rate and regular rhythm.  Pulmonary/Chest:  Patient being bagged via facemask.  Abdominal: Soft.  Musculoskeletal: She exhibits no edema or deformity.  Neurological: GCS eye subscore is 1. GCS verbal subscore is 1. GCS motor subscore is 4.  Skin: Skin is intact.  Psychiatric: She is noncommunicative.     ED Treatments / Results  Labs (all labs ordered are listed, but only abnormal results are displayed) Labs Reviewed  CBC WITH DIFFERENTIAL/PLATELET - Abnormal; Notable for the following components:      Result Value   WBC 2.8 (*)    RBC 3.75 (*)    Hemoglobin 9.7 (*)    HCT 30.9 (*)    MCH 25.9 (*)    RDW 16.7 (*)    Lymphs Abs 0.5 (*)    Monocytes Absolute 0.0 (*)    All other components within normal limits  COMPREHENSIVE METABOLIC PANEL - Abnormal; Notable for the following components:   Potassium 2.4 (*)    Chloride 112 (*)    CO2 20 (*)    Glucose, Bld 111 (*)    Calcium 7.5 (*)    Total Protein 4.5 (*)  Albumin 2.5 (*)    All other components within normal limits  ETHANOL - Abnormal; Notable for the following components:   Alcohol, Ethyl (B) 63 (*)    All other components within normal limits  AMMONIA - Abnormal; Notable for the following components:   Ammonia 36 (*)    All other components within normal limits  ACETAMINOPHEN LEVEL - Abnormal; Notable for the following components:   Acetaminophen (Tylenol), Serum <10 (*)    All other components within normal limits  LACTIC ACID, PLASMA - Abnormal; Notable for the following components:   Lactic Acid, Venous 2.5 (*)    All other components within normal limits  I-STAT CG4 LACTIC ACID, ED - Abnormal; Notable for the following components:   Lactic Acid, Venous 3.21 (*)    All other components within normal limits  I-STAT ARTERIAL BLOOD GAS, ED - Abnormal; Notable for the following components:   pH,  Arterial 7.201 (*)    pO2, Arterial 208.0 (*)    Bicarbonate 17.7 (*)    TCO2 19 (*)    Acid-base deficit 10.0 (*)    All other components within normal limits  CULTURE, RESPIRATORY  CULTURE, BLOOD (ROUTINE X 2)  CULTURE, BLOOD (ROUTINE X 2)  MRSA PCR SCREENING  SALICYLATE LEVEL  PROTIME-INR  TROPONIN I  URINALYSIS, ROUTINE W REFLEX MICROSCOPIC  RAPID URINE DRUG SCREEN, HOSP PERFORMED  HIV ANTIBODY (ROUTINE TESTING W REFLEX)  TROPONIN I  TROPONIN I  LACTIC ACID, PLASMA  PROCALCITONIN  CORTISOL  BLOOD GAS, ARTERIAL  I-STAT TROPONIN, ED    EKG None  Radiology Ct Head Wo Contrast  Result Date: 05/23/2018 CLINICAL DATA:  Pulseless arrest, status post CPR. EXAM: CT HEAD WITHOUT CONTRAST CT CERVICAL SPINE WITHOUT CONTRAST TECHNIQUE: Multidetector CT imaging of the head and cervical spine was performed following the standard protocol without intravenous contrast. Multiplanar CT image reconstructions of the cervical spine were also generated. COMPARISON:  None. FINDINGS: CT HEAD FINDINGS Brain: There is no mass, hemorrhage or extra-axial collection. The size and configuration of the ventricles and extra-axial CSF spaces are normal. There is no acute or chronic infarction. The brain parenchyma is normal. Vascular: No abnormal hyperdensity of the major intracranial arteries or dural venous sinuses. No intracranial atherosclerosis. Skull: The visualized skull base, calvarium and extracranial soft tissues are normal. Sinuses/Orbits: Fluid layering in the maxillary and sphenoid sinuses. The orbits are normal. CT CERVICAL SPINE FINDINGS Alignment: There is grade 1 anterolisthesis at C3-4, C4-5 and C5-6 secondary to facet hypertrophy. There is normal facet alignment. Lateral masses of C1 and C2 and the occipital condyles are aligned. Skull base and vertebrae: No acute fracture. Soft tissues and spinal canal: No prevertebral fluid or swelling. No visible canal hematoma. Disc levels: No advanced  spinal canal or neural foraminal stenosis. Upper chest: Large area of consolidation in the right upper lobe. Other: Patient is intubated with the endotracheal tube tip beyond the field of view. IMPRESSION: 1. No acute intracranial abnormality. 2. No acute fracture of the cervical spine. 3. Large area of right upper lobe consolidation, possibly secondary to aspiration. Electronically Signed   By: Deatra Robinson M.D.   On: 05/23/2018 06:34   Ct Cervical Spine Wo Contrast  Result Date: 05/23/2018 CLINICAL DATA:  Pulseless arrest, status post CPR. EXAM: CT HEAD WITHOUT CONTRAST CT CERVICAL SPINE WITHOUT CONTRAST TECHNIQUE: Multidetector CT imaging of the head and cervical spine was performed following the standard protocol without intravenous contrast. Multiplanar CT image reconstructions of the cervical spine  were also generated. COMPARISON:  None. FINDINGS: CT HEAD FINDINGS Brain: There is no mass, hemorrhage or extra-axial collection. The size and configuration of the ventricles and extra-axial CSF spaces are normal. There is no acute or chronic infarction. The brain parenchyma is normal. Vascular: No abnormal hyperdensity of the major intracranial arteries or dural venous sinuses. No intracranial atherosclerosis. Skull: The visualized skull base, calvarium and extracranial soft tissues are normal. Sinuses/Orbits: Fluid layering in the maxillary and sphenoid sinuses. The orbits are normal. CT CERVICAL SPINE FINDINGS Alignment: There is grade 1 anterolisthesis at C3-4, C4-5 and C5-6 secondary to facet hypertrophy. There is normal facet alignment. Lateral masses of C1 and C2 and the occipital condyles are aligned. Skull base and vertebrae: No acute fracture. Soft tissues and spinal canal: No prevertebral fluid or swelling. No visible canal hematoma. Disc levels: No advanced spinal canal or neural foraminal stenosis. Upper chest: Large area of consolidation in the right upper lobe. Other: Patient is intubated with  the endotracheal tube tip beyond the field of view. IMPRESSION: 1. No acute intracranial abnormality. 2. No acute fracture of the cervical spine. 3. Large area of right upper lobe consolidation, possibly secondary to aspiration. Electronically Signed   By: Deatra Robinson M.D.   On: 05/23/2018 06:34   Dg Chest Portable 1 View  Result Date: 05/23/2018 CLINICAL DATA:  Intubation EXAM: PORTABLE CHEST 1 VIEW COMPARISON:  None. FINDINGS: Endotracheal tube tip is within the proximal right mainstem bronchus. This should be retracted by 4.5 cm to place it at the level of the clavicular heads. There is a large area of right parahilar consolidation. The left lung is clear. No pleural effusion or pneumothorax. The nasogastric tube side port is below the diaphragm but not clearly visualized. A dedicated abdominal radiograph may be helpful. IMPRESSION: 1. Endotracheal tube tip in the proximal right mainstem bronchus. Retraction by 4 cm recommended. 2. Nasogastric tube side port below the diaphragm but not clearly visualized. Dedicated abdominal radiograph may be helpful. 3. Large area of right parahilar and medial right lung apex consolidation which may indicate infection or neoplastic process. Chest CT or Followup PA and lateral chest X-ray is recommended in 3-4 weeks following trial of antibiotic therapy to ensure resolution and exclude underlying malignancy. These results were called by telephone at the time of interpretation on 05/23/2018 at 5:18 am to Dr. Jaci Carrel , who verbally acknowledged these results. Electronically Signed   By: Deatra Robinson M.D.   On: 05/23/2018 05:18    Procedures .Critical Care Performed by: Gilda Crease, MD Authorized by: Gilda Crease, MD   Critical care provider statement:    Critical care time (minutes):  35   Critical care time was exclusive of:  Separately billable procedures and treating other patients   Critical care was necessary to treat or  prevent imminent or life-threatening deterioration of the following conditions:  CNS failure or compromise, respiratory failure and toxidrome   Critical care was time spent personally by me on the following activities:  Ordering and performing treatments and interventions, ordering and review of laboratory studies, ordering and review of radiographic studies, discussions with consultants, pulse oximetry, re-evaluation of patient's condition, review of old charts, evaluation of patient's response to treatment and examination of patient   (including critical care time)  Medications Ordered in ED Medications  fentaNYL in NS (43mcg/ml) infusion-PREMIX (100 mcg/hr Intravenous Transfusing/Transfer 05/23/18 0608)  heparin injection 5,000 Units (has no administration in time range)  pantoprazole sodium (  PROTONIX) 40 mg/20 mL oral suspension 40 mg (has no administration in time range)  folic acid injection 1 mg (has no administration in time range)  thiamine (B-1) injection 100 mg (has no administration in time range)  0.9 %  sodium chloride infusion ( Intravenous New Bag/Given 05/23/18 0655)  0.9 %  sodium chloride infusion (has no administration in time range)  midazolam (VERSED) injection 1 mg (has no administration in time range)  midazolam (VERSED) injection 1 mg (has no administration in time range)  sodium chloride 0.9 % bolus 1,000 mL (1,000 mLs Intravenous New Bag/Given 05/23/18 0630)  0.9 %  sodium chloride infusion (has no administration in time range)  norepinephrine (LEVOPHED) 4mg  in D5W premix infusion (10 mcg/min Intravenous New Bag/Given 05/23/18 0645)  etomidate (AMIDATE) injection (20 mg Intravenous Given 05/23/18 0418)  succinylcholine (ANECTINE) injection (100 mg Intravenous Given 05/23/18 0418)  norepinephrine (LEVOPHED) 4mg  in D5W premix infusion (0 mcg/min Intravenous Stopped 05/23/18 0534)  fentaNYL (SUBLIMAZE) injection 200 mcg (200 mcg Intravenous  Given 05/23/18 0441)  etomidate (AMIDATE) injection 20 mg (20 mg Intravenous Given 05/23/18 0418)  succinylcholine (ANECTINE) injection 100 mg (100 mg Intravenous Given 05/23/18 0418)  sodium chloride 0.9 % bolus 2,000 mL (0 mLs Intravenous Stopped 05/23/18 0600)  sodium bicarbonate injection 50 mEq (50 mEq Intravenous Given 05/23/18 0540)  sodium bicarbonate 1 mEq/mL injection (  Duplicate 05/23/18 0626)  midazolam (VERSED) injection 2 mg (2 mg Intravenous Given 05/23/18 1610)     Initial Impression / Assessment and Plan / ED Course  I have reviewed the triage vital signs and the nursing notes.  Pertinent labs & imaging results that were available during my care of the patient were reviewed by me and considered in my medical decision making (see chart for details).     Patient presented to the emergency department from home.  Patient was found in bed, unresponsive.  She had vomited and was in respiratory distress.  Multiple prescription medications with bottles were empty, presumed suicide attempt.  She had not been heard from by her husband in many hours prior to him coming home from work and finding her.  She did receive CPR briefly because her husband and first responders could not find pulses.  EMS reported ROSC at their arrival.  She was intubated at arrival to the ER.  Patient was hypotensive for EMS, they started an epinephrine drip.  Patient was switched to levo fed here in the ER and blood pressure has normalized.  She did get somewhat agitated after intubation, was moving all 4 extremities.  She was placed on fentanyl for sedation.  Critical care medicine was consulted and patient will be admitted to the ICU.  Final Clinical Impressions(s) / ED Diagnoses   Final diagnoses:  Drug overdose, undetermined intent, initial encounter  Acute respiratory failure, unspecified whether with hypoxia or hypercapnia [facility])    ED Discharge Orders    None       Gilda Crease,  MD 05/23/18 [phone]

## 2018-05-23 NOTE — Procedures (Signed)
Central Venous Catheter Insertion Procedure Note CALEDONIA ZOU [id number] February 16, 1951  Procedure: Insertion of Central Venous Catheter Indications: Assessment of intravascular volume, Drug and/or fluid administration and Frequent blood sampling  Procedure Details Consent: Risks of procedure as well as the alternatives and risks of each were explained to the (patient/caregiver).  Consent for procedure obtained. Time Out: Verified patient identification, verified procedure, site/side was marked, verified correct patient position, special equipment/implants available, medications/allergies/relevent history reviewed, required imaging and test results available.  Performed  Maximum sterile technique was used including antiseptics, cap, gloves, gown, hand hygiene, mask and sheet. Skin prep: Chlorhexidine; local anesthetic administered A antimicrobial bonded/coated triple lumen catheter was placed in the right internal jugular vein using the Seldinger technique.  Evaluation Blood flow good Complications: No apparent complications Patient did tolerate procedure well. Chest X-ray ordered to verify placement.  CXR: pending.  Jovita Kussmaul, AGACNP-BC [facility]  Pgr: [phone]  PCCM Pgr: [phone]

## 2018-05-23 NOTE — ED Notes (Signed)
Called main lab to inquire about lab results.  Per lab they did not receive any blood.  Per main lab blood may be lost.  I notified NP and recollected all labs.

## 2018-05-23 NOTE — Progress Notes (Signed)
Critical ABG results RBV by Kellie Shropshire, RN at [phone] on 05/23/2018 by Gertie Fey, RRT who stated that she will call MD.

## 2018-05-23 NOTE — Procedures (Signed)
Arterial Catheter Insertion Procedure Note Courtney Beard [id number] 1950-09-09  Procedure: Insertion of Arterial Catheter  Indications: Blood pressure monitoring and Frequent blood sampling  Procedure Details Consent: Risks of procedure as well as the alternatives and risks of each were explained to the (patient/caregiver).  Consent for procedure obtained. and Unable to obtain consent because of altered level of consciousness. Time Out: Verified patient identification, verified procedure, site/side was marked, verified correct patient position, special equipment/implants available, medications/allergies/relevent history reviewed, required imaging and test results available.  Performed  Maximum sterile technique was used including antiseptics, cap, gloves, gown, mask and sheet. Skin prep: Chlorhexidine; local anesthetic administered 20 gauge catheter was inserted into left radial artery using the Seldinger technique. ULTRASOUND GUIDANCE USED: YES Evaluation Blood flow good; BP tracing good. Complications: No apparent complications.   Courtney Beard 05/23/2018

## 2018-05-23 NOTE — Progress Notes (Signed)
Initial Nutrition Assessment  DOCUMENTATION CODES:  Not applicable  INTERVENTION:  Initiate TF via OGT with Vital AF 1.2 at goal rate of 55 ml/h (1320 ml per day)  to provide 1584 kcals, 99 gm protein, 1071 ml free water daily.  NUTRITION DIAGNOSIS:  Inadequate oral intake related to inability to eat as evidenced by NPO status.  GOAL:  Patient will meet greater than or equal to 90% of their needs  MONITOR:  Labs, Weight trends, Vent status, I & O's, TF tolerance  REASON FOR ASSESSMENT:  Ventilator    ASSESSMENT:  67 y/o female PMHx anxiety, depression, etoh use. Presents after husband found pt on floor, covered in emesis w/ empty bottles of klonopin and zanaflex. Questionable cardiac arrest. Intubated on arrival to ED. Pt developed septic shock w/ CXR showing aspiration PNA.    Patient intubated, responds to touch, but unable to communicate. No historians present. Unfortunately, there is also no prior chart history to review. Bed weight was 56.4 kg  Received V.O. To begin TF once placement of ogt confirmed.   Physical exam-thin, skin wrinkled. Largely no palpable areas of depletion though.   Patient is currently intubated on ventilator support MV: 10.5 L/min Temp (24hrs), Avg:97.6 F (36.4 C), Min:94.3 F (34.6 C), Max:100.9 F (38.3 C) Propofol: None  Labs: k:2.4, Glu:90-110, Albumin: 2.5, LA: 2.5 Meds: Folate, KCL, ppi, thiamin Pressor support: Levophed Sedation/analgesia: Fentanyl Other infusions: IV abx, IVF, bicarb  Recent Labs  Lab 05/23/18 0555  NA 142  K 2.4*  CL 112*  CO2 20*  BUN 13  CREATININE 0.81  CALCIUM 7.5*  GLUCOSE 111*   NUTRITION - FOCUSED PHYSICAL EXAM:   Most Recent Value  Orbital Region  No depletion  Upper Arm Region  No depletion  Thoracic and Lumbar Region  Mild depletion  Buccal Region  No depletion  Temple Region  No depletion  Clavicle Bone Region  Mild depletion  Clavicle and Acromion Bone Region  No depletion  Scapular  Bone Region  Unable to assess  Dorsal Hand  Unable to assess  Patellar Region  No depletion  Anterior Thigh Region  Unable to assess  Posterior Calf Region  No depletion     Diet Order:   Diet Order            Diet NPO time specified  Diet effective now             EDUCATION NEEDS:  No education needs have been identified at this time  Skin:  Skin Assessment: Reviewed RN Assessment  Last BM:  Unknown  Height:  Ht Readings from Last 1 Encounters:  05/23/18 5\' 2"  (1.575 m)   Weight:  Wt Readings from Last 1 Encounters:  05/23/18 56.4 kg  No further wt hx in chart  Ideal Body Weight:  50 kg  BMI:  Body mass index is 22.74 kg/m.  Estimated Nutritional Needs:  Kcal:  1535 kcals (psu 2003 b) Protein:  85-100g Pro (1.5-1.8g/kg bw) Fluid:  Per MD goals  Christophe Louis RD, LDN, CNSC Clinical Nutrition Available Tues-Sat via Pager: [phone] 05/23/2018 10:52 AM

## 2018-05-23 NOTE — Progress Notes (Signed)
   05/23/18 0500  Clinical Encounter Type  Visited With Family  Visit Type Initial  Referral From Nurse  Spiritual Encounters  Spiritual Needs Prayer;Emotional  Stress Factors  Family Stress Factors Loss of control;Exhausted;Family relationships   Responded to page from Nurse. PT was unresponsive and being cared for. I offered Ministry of presence. Nurse asked me to console husband in ER waiting room. Husband was tearful and very thankful for my visit with him. I offered husband a listening ear, words of encouragement and prayer. Chaplain available as needed.   Chaplain Orest Dikes  [phone]

## 2018-05-23 NOTE — ED Notes (Signed)
Chaplain paged for family in [place].

## 2018-05-23 NOTE — ED Provider Notes (Signed)
  Physical Exam    ED Course/Procedures     Procedure Name: Intubation Date/Time: 05/23/2018 5:57 AM Performed by: Roxy Horseman, PA-C Pre-anesthesia Checklist: Patient identified, Emergency Drugs available, Suction available, Patient being monitored and Timeout performed Oxygen Delivery Method: Ambu bag Preoxygenation: Pre-oxygenation with 100% oxygen Induction Type: IV induction Ventilation: Mask ventilation with difficulty Laryngoscope Size: Glidescope and 3 Grade View: Grade I Tube size: 7.0 mm Number of attempts: 2 (attempt 1 with 7.14mm failed) Airway Equipment and Method: Video-laryngoscopy Placement Confirmation: ETT inserted through vocal cords under direct vision,  Positive ETCO2,  CO2 detector and Breath sounds checked- equal and bilateral Tube secured with: ETT holder Dental Injury: Bloody posterior oropharynx  Difficulty Due To: Difficulty was anticipated, Difficult Airway- due to cervical collar, Difficult Airway- due to reduced neck mobility and Difficult Airway-  due to edematous airway          Roxy Horseman, PA-C 05/23/18 0600    Gilda Crease, MD 05/23/18 [phone]

## 2018-05-23 NOTE — Progress Notes (Signed)
NAME:  Courtney Beard, MRN:  [id number], DOB:  1950-11-21, LOS: 0 ADMISSION DATE:  05/23/2018, CONSULTATION DATE:  10/12 REFERRING MD:  Dr. Blinda Leatherwood, CHIEF COMPLAINT:  Overdose    History Present Illness   67 year old female presents s/p cardiac arrest. Husband reports that patient went to the gym at 0830 10/11, he then went to work, after work he came home had a few shots and then went upstairs around 0200 when he found the patient covered in emesis unresponsive with no palpable pulse with empty bottles of Klonopin and Zanaflex. Husband got patient out of bed to the floor (reports she hit her head during this time) and started CPR. When EMS arrived patient had a pulse. BP 80/40. On arrival to ED patient remained unresponsive, was intubated with difficulty, swelling noted around vocal cords. Given 1L Bolus. Started on Levophed gtt. PCCM asked to admit.    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 To his knowledge she has never attempted or wanted to attempt to harm herself. However since retiring she has voiced feeling un-needed and useless.   Past Medical History  Anxiety, Depression, ETOH   Significant Hospital Events   10-12 > Presents to ED   Consults: date of consult/date signed off & final recs:  PCCM  Procedures (surgical and bedside):  ETT 10/12 >>  Significant Diagnostic Tests:  CXR 10/12 > 1. Endotracheal tube tip in the proximal right mainstem bronchus. Retraction by 4 cm recommended. 2. Nasogastric tube side port below the diaphragm but not clearly visualized. Dedicated abdominal radiograph may be helpful. 3. Large area of right parahilar and medial right lung apex consolidation which may indicate infection or neoplastic process. Chest CT or Followup PA and lateral chest X-ray is recommended in 3-4 weeks following trial of antibiotic therapy to ensure  resolution and exclude underlying malignancy. CT Head/C-Spine 10/12 > 1. No acute intracranial abnormality. 2. No acute fracture of the cervical spine. 3. Large area of right upper lobe consolidation, possibly secondary to aspiration.  Micro Data:  Blood 10/12 >> Sputum 10/12 >>  U/A 10/12 >>  Resp 10/12 >>   Antimicrobials:  Zosyn 10/12 >>    Subjective:  Hemodynamically improved, remains on norepinephrine 7 Wakes and interacts on fentanyl 100  Objective   Blood pressure (!) 143/119, pulse (!) 109, temperature (!) 97 F (36.1 C), resp. rate (!) 30, height 5\' 2"  (1.575 m), weight 59 kg, SpO2 97 %.    Vent Mode: PRVC FiO2 (%):  [50 %-100 %] 80 % Set Rate:  [18 bmp-20 bmp] 20 bmp Vt Set:  [400 mL] 400 mL PEEP:  [5 cmH20] 5 cmH20   Intake/Output Summary (Last 24 hours) at 05/23/2018 0931 Last data filed at 05/23/2018 0900 Gross per 24 hour  Intake 3931.93 ml  Output 260 ml  Net 3671.93 ml   Filed Weights   05/23/18 0520  Weight: 59 kg    Examination: General: Ill-appearing adult woman, intubated HENT: ET tube in place, mild icterus Lungs: Coarse bilateral breath sounds, especially on the right, no wheezing Cardiovascular: Tachycardic, no murmur Abdomen: Soft, nondistended, positive bowel sounds Extremities: No significant edema Neuro: Wakes to voice, nods to questions, attempt to communicate, moves all extremities GU: Foley in place  Resolved Hospital Problem list     Assessment & Plan:   Acute Hypoxic Respiratory Distress in setting of toxic ingestion, aspiration  PNA CXR with large area of right parahilar and medial right lung apex consolidation  Plan  -Continue PRVC 8 cc/kg, wean FiO2 as able -Follow x-ray, ABG intermittently -VAP prevention orders  Septic Shock, improving Questionable Cardiac Arrest (PEA) due to Overdose  Prolonged QTC -Husband was unable to palpate pulse, when EMS arrived patient was hypotension with pulse  Plan  -Continue  telemetry monitoring -Wean norepinephrine, should be able to come to off.  Goal MAP > 65 -Follow troponin -Echocardiogram ordered and pending -Follow ECG  Hypokalemia  Non-Gap Metabolic Acidosis with coexisting lactic Acidosis-clearing Plan  -Follow BMP -Follow lactic acid for clearance -Start low-dose bicarbonate drip given the non-gap component of her acidosis, etiology unclear but consider ingested medications -Replace potassium and other electrolytes as indicated  Aspiration PNA  Plan  -Zosyn as ordered -Obtain respiratory culture 10/12, follow other cultures -Consider bronchoscopy if progression of right upper lobe volume loss  Overdose, benzo and Zanaflex plus alcohol -Acetaminophen <10 -Salicylate <7 -ETOH 63  -CT Head/C-Spine negative  -UDS >> benzos H/O Anxiety/Depression, ETOH  Plan  -Currently on fentanyl drip, may need to add low-dose sedation or Precedex -Will require psychiatry consult and safety sitter once extubated -Continue folic acid, thiamine   Disposition / Summary of Today's Plan 05/23/18   Add bicarbonate Wean norepinephrine Continue antibiotics for right upper lobe pneumonia Wean FiO2    Diet: NPO Pain/Anxiety/Delirium protocol : Fentanyl drip, Versed as needed VAP protocol: In place DVT prophylaxis: Heparin  GI prophylaxis: PPI Hyperglycemia protocol Mobility: Bedrest  Code Status: Full Code  Family Communication: Status and plans with husband at bedside on 10/12  Labs   CBC: Recent Labs  Lab 05/23/18 0555  WBC 2.8*  NEUTROABS 2.3  HGB 9.7*  HCT 30.9*  MCV 82.4  PLT 245    Basic Metabolic Panel: Recent Labs  Lab 05/23/18 0555  NA 142  K 2.4*  CL 112*  CO2 20*  GLUCOSE 111*  BUN 13  CREATININE 0.81  CALCIUM 7.5*   GFR: Estimated Creatinine Clearance: 54.8 mL/min (by C-G formula based on SCr of 0.81 mg/dL). Recent Labs  Lab 05/23/18 0502 05/23/18 0555  PROCALCITON  --  <0.10  WBC  --  2.8*  LATICACIDVEN 3.21*  2.5*    Liver Function Tests: Recent Labs  Lab 05/23/18 0555  AST 28  ALT 17  ALKPHOS 50  BILITOT 0.4  PROT 4.5*  ALBUMIN 2.5*   No results for input(s): LIPASE, AMYLASE in the last 168 hours. Recent Labs  Lab 05/23/18 0555  AMMONIA 36*    ABG    Component Value Date/Time   PHART 7.213 (L) 05/23/2018 0749   PCO2ART 43.8 05/23/2018 0749   PO2ART 88.0 05/23/2018 0749   HCO3 18.1 (L) 05/23/2018 0749   TCO2 20 (L) 05/23/2018 0749   ACIDBASEDEF 10.0 (H) 05/23/2018 0749   O2SAT 96.0 05/23/2018 0749     Coagulation Profile: Recent Labs  Lab 05/23/18 0555  INR 1.06    Cardiac Enzymes: Recent Labs  Lab 05/23/18 0555  TROPONINI <0.03    HbA1C: No results found for: HGBA1C  CBG: Recent Labs  Lab 05/23/18 0750  GLUCAP 89   Independent CC time 32 minutes  Levy Pupa, MD, PhD 05/23/2018, 9:40 AM [facility] [phone] or if no answer [phone]

## 2018-05-24 DIAGNOSIS — T50902S Poisoning by unspecified drugs, medicaments and biological substances, intentional self-harm, sequela: Secondary | ICD-10-CM

## 2018-05-24 LAB — CBC
HCT: 33.2 % — ABNORMAL LOW (ref 36.0–46.0)
Hemoglobin: 10.1 g/dL — ABNORMAL LOW (ref 12.0–15.0)
MCH: 25.6 pg — AB (ref 26.0–34.0)
MCHC: 30.4 g/dL (ref 30.0–36.0)
MCV: 84.1 fL (ref 80.0–100.0)
PLATELETS: 243 10*3/uL (ref 150–400)
RBC: 3.95 MIL/uL (ref 3.87–5.11)
RDW: 17.4 % — AB (ref 11.5–15.5)
WBC: 2.4 10*3/uL — ABNORMAL LOW (ref 4.0–10.5)
nRBC: 0 % (ref 0.0–0.2)

## 2018-05-24 LAB — BASIC METABOLIC PANEL
ANION GAP: 4 — AB (ref 5–15)
Anion gap: 5 (ref 5–15)
BUN: 13 mg/dL (ref 8–23)
BUN: 15 mg/dL (ref 8–23)
CALCIUM: 7.1 mg/dL — AB (ref 8.9–10.3)
CO2: 26 mmol/L (ref 22–32)
CO2: 26 mmol/L (ref 22–32)
CREATININE: 0.69 mg/dL (ref 0.44–1.00)
Calcium: 6.7 mg/dL — ABNORMAL LOW (ref 8.9–10.3)
Chloride: 109 mmol/L (ref 98–111)
Chloride: 105 mmol/L (ref 98–111)
Creatinine, Ser: 0.79 mg/dL (ref 0.44–1.00)
GFR calc Af Amer: >60 mL/min (ref >60)
GFR calc non Af Amer: >60 mL/min (ref >60)
GLUCOSE: 145 mg/dL — AB (ref 70–99)
Glucose, Bld: 185 mg/dL — ABNORMAL HIGH (ref 70–99)
POTASSIUM: 4.9 mmol/L (ref 3.5–5.1)
Potassium: 5.1 mmol/L (ref 3.5–5.1)
SODIUM: 136 mmol/L (ref 135–145)
Sodium: 139 mmol/L (ref 135–145)

## 2018-05-24 LAB — POCT I-STAT 3, ART BLOOD GAS (G3+)
ACID-BASE DEFICIT: 4 mmol/L — AB (ref 0.0–2.0)
ACID-BASE DEFICIT: 1 mmol/L (ref 0.0–2.0)
Acid-Base Excess: 1 mmol/L (ref 0.0–2.0)
Acid-base deficit: 2 mmol/L (ref 0.0–2.0)
BICARBONATE: 26.9 mmol/L (ref 20.0–28.0)
BICARBONATE: 25.3 mmol/L (ref 20.0–28.0)
Bicarbonate: 27.5 mmol/L (ref 20.0–28.0)
Bicarbonate: 27.8 mmol/L (ref 20.0–28.0)
O2 SAT: 98 %
O2 SAT: 90 %
O2 Saturation: 81 %
O2 Saturation: 97 %
PCO2 ART: 86.1 mmHg — AB (ref 32.0–48.0)
PCO2 ART: 58.4 mmHg — AB (ref 32.0–48.0)
PH ART: 7.216 — AB (ref 7.350–7.450)
PO2 ART: 62 mmHg — AB (ref 83.0–108.0)
PO2 ART: 126 mmHg — AB (ref 83.0–108.0)
Patient temperature: 98
Patient temperature: 37.8
Patient temperature: 37.7
TCO2: 29 mmol/L (ref 22–32)
TCO2: 27 mmol/L (ref 22–32)
TCO2: 30 mmol/L (ref 22–32)
TCO2: 29 mmol/L (ref 22–32)
pCO2 arterial: 68.3 mmHg (ref 32.0–48.0)
pCO2 arterial: 53.4 mmHg — ABNORMAL HIGH (ref 32.0–48.0)
pH, Arterial: 7.1 — CL (ref 7.350–7.450)
pH, Arterial: 7.248 — ABNORMAL LOW (ref 7.350–7.450)
pH, Arterial: 7.328 — ABNORMAL LOW (ref 7.350–7.450)
pO2, Arterial: 111 mmHg — ABNORMAL HIGH (ref 83.0–108.0)
pO2, Arterial: 67 mmHg — ABNORMAL LOW (ref 83.0–108.0)

## 2018-05-24 LAB — GLUCOSE, CAPILLARY
GLUCOSE-CAPILLARY: 145 mg/dL — AB (ref 70–99)
GLUCOSE-CAPILLARY: 155 mg/dL — AB (ref 70–99)
GLUCOSE-CAPILLARY: 155 mg/dL — AB (ref 70–99)
Glucose-Capillary: 130 mg/dL — ABNORMAL HIGH (ref 70–99)
Glucose-Capillary: 169 mg/dL — ABNORMAL HIGH (ref 70–99)
Glucose-Capillary: 119 mg/dL — ABNORMAL HIGH (ref 70–99)

## 2018-05-24 LAB — BLOOD GAS, ARTERIAL
ACID-BASE DEFICIT: 3.4 mmol/L — AB (ref 0.0–2.0)
BICARBONATE: 25.1 mmol/L (ref 20.0–28.0)
Drawn by: 51702
FIO2: 100
LHR: 20 {breaths}/min
O2 SAT: 96.3 %
PATIENT TEMPERATURE: 98.6
PCO2 ART: 82.8 mmHg — AB (ref 32.0–48.0)
PEEP/CPAP: 5 cmH2O
PH ART: 7.109 — AB (ref 7.350–7.450)
VT: 400 mL
pO2, Arterial: 93.4 mmHg (ref 83.0–108.0)

## 2018-05-24 LAB — PHOSPHORUS: Phosphorus: 3.8 mg/dL (ref 2.5–4.6)

## 2018-05-24 LAB — MAGNESIUM: Magnesium: 2.1 mg/dL (ref 1.7–2.4)

## 2018-05-24 LAB — PROCALCITONIN: Procalcitonin: 21.72 ng/mL

## 2018-05-24 MED ORDER — VASOPRESSIN 20 UNIT/ML IV SOLN
0.0300 [IU]/min | INTRAVENOUS | Status: DC
  Administered 2018-05-24 – 2018-05-25 (×2): 0.03 [IU]/min via INTRAVENOUS
  Filled 2018-05-24 (×3): qty 2

## 2018-05-24 MED ORDER — SODIUM CHLORIDE 0.9 % IV SOLN
3.0000 ug/kg/min | INTRAVENOUS | Status: DC
  Administered 2018-05-24: 3 ug/kg/min via INTRAVENOUS
  Administered 2018-05-25 (×2): 3.5 ug/kg/min via INTRAVENOUS
  Administered 2018-05-26: 3 ug/kg/min via INTRAVENOUS
  Administered 2018-05-27: 4 ug/kg/min via INTRAVENOUS
  Filled 2018-05-24 (×5): qty 20

## 2018-05-24 MED ORDER — ACETAMINOPHEN 160 MG/5ML PO SOLN: 650.0000 mg | Freq: Once | ORAL | Status: AC

## 2018-05-24 MED ORDER — SODIUM BICARBONATE 8.4 % IV SOLN
INTRAVENOUS | Status: DC
  Administered 2018-05-24: 07:00:00 via INTRAVENOUS
  Filled 2018-05-24 (×2): qty 100

## 2018-05-24 MED ORDER — ACETAMINOPHEN 325 MG PO TABS
650.0000 mg | ORAL_TABLET | Freq: Four times a day (QID) | ORAL | Status: DC | PRN
  Administered 2018-05-24 – 2018-06-19 (×24): 650 mg via ORAL
  Filled 2018-05-24 (×25): qty 2

## 2018-05-24 MED ORDER — MIDAZOLAM BOLUS VIA INFUSION
2.0000 mg | INTRAVENOUS | Status: DC | PRN
  Administered 2018-05-24 – 2018-05-28 (×11): 2 mg via INTRAVENOUS
  Filled 2018-05-24: qty 2

## 2018-05-24 MED ORDER — CISATRACURIUM BOLUS VIA INFUSION
2.5000 mg | Freq: Once | INTRAVENOUS | Status: AC
  Administered 2018-05-24: 2.5 mg via INTRAVENOUS
  Filled 2018-05-24: qty 3

## 2018-05-24 MED ORDER — SODIUM CHLORIDE 0.9 % IV SOLN
2.0000 mg/h | INTRAVENOUS | Status: DC
  Administered 2018-05-24 – 2018-05-25 (×2): 2 mg/h via INTRAVENOUS
  Administered 2018-05-25 – 2018-05-26 (×2): 3 mg/h via INTRAVENOUS
  Administered 2018-05-27: 2 mg/h via INTRAVENOUS
  Administered 2018-05-28: 5 mg/h via INTRAVENOUS
  Administered 2018-05-28 (×2): 6 mg/h via INTRAVENOUS
  Administered 2018-05-28: 8 mg/h via INTRAVENOUS
  Administered 2018-05-29: 7 mg/h via INTRAVENOUS
  Administered 2018-05-29: 6 mg/h via INTRAVENOUS
  Administered 2018-05-29 – 2018-05-30 (×2): 7 mg/h via INTRAVENOUS
  Filled 2018-05-24 (×13): qty 10

## 2018-05-24 MED ORDER — MIDAZOLAM HCL 2 MG/2ML IJ SOLN: 2.0000 mg | Freq: Once | INTRAMUSCULAR | Status: AC

## 2018-05-24 MED ORDER — VANCOMYCIN HCL IN DEXTROSE 1-5 GM/200ML-% IV SOLN
1000.0000 mg | Freq: Once | INTRAVENOUS | Status: AC
  Administered 2018-05-24: 1000 mg via INTRAVENOUS
  Filled 2018-05-24: qty 200

## 2018-05-24 MED ORDER — SODIUM BICARBONATE 8.4 % IV SOLN
INTRAVENOUS | Status: DC
  Administered 2018-05-24 – 2018-05-25 (×2): via INTRAVENOUS
  Filled 2018-05-24 (×4): qty 150

## 2018-05-24 MED ORDER — ARTIFICIAL TEARS OPHTHALMIC OINT
1.0000 "application " | TOPICAL_OINTMENT | Freq: Three times a day (TID) | OPHTHALMIC | Status: DC
  Administered 2018-05-24 – 2018-05-27 (×10): 1 via OPHTHALMIC
  Filled 2018-05-24: qty 3.5

## 2018-05-24 MED ORDER — SODIUM BICARBONATE 8.4 % IV SOLN
100.0000 meq | Freq: Once | INTRAVENOUS | Status: AC
  Administered 2018-05-24: 100 meq via INTRAVENOUS
  Filled 2018-05-24: qty 50

## 2018-05-24 MED ORDER — VANCOMYCIN HCL 500 MG IV SOLR
500.0000 mg | Freq: Two times a day (BID) | INTRAVENOUS | Status: DC
  Administered 2018-05-24 – 2018-05-25 (×2): 500 mg via INTRAVENOUS
  Filled 2018-05-24 (×2): qty 500

## 2018-05-24 MED ORDER — MIDAZOLAM HCL 2 MG/2ML IJ SOLN
2.0000 mg | Freq: Once | INTRAMUSCULAR | Status: DC | PRN
  Filled 2018-05-24: qty 2

## 2018-05-24 MED ORDER — NOREPINEPHRINE 16 MG/250ML-% IV SOLN
0.0000 ug/min | INTRAVENOUS | Status: DC
  Administered 2018-05-24: 21.333 ug/min via INTRAVENOUS
  Administered 2018-05-24: 2 ug/min via INTRAVENOUS
  Administered 2018-05-25: 30 ug/min via INTRAVENOUS
  Filled 2018-05-24 (×4): qty 250

## 2018-05-24 NOTE — Progress Notes (Signed)
Critical abg results RBV by Lynetta Mare, RN at 1135 on 05/24/2018 by Gertie Fey, RRT

## 2018-05-24 NOTE — Progress Notes (Signed)
NAME:  Courtney Beard, MRN:  [id number], DOB:  11/19/1950, LOS: 1 ADMISSION DATE:  05/23/2018, CONSULTATION DATE:  10/12 REFERRING MD:  Dr. Blinda Leatherwood, CHIEF COMPLAINT:  Overdose    History Present Illness   67 year old female presents s/p cardiac arrest. Husband reports that patient went to the gym at 0830 10/11, he then went to work, after work he came home had a few shots and then went upstairs around 0200 when he found the patient covered in emesis unresponsive with no palpable pulse with empty bottles of Klonopin and Zanaflex. Husband got patient out of bed to the floor (reports she hit her head during this time) and started CPR. When EMS arrived patient had a pulse. BP 80/40. On arrival to ED patient remained unresponsive, was intubated with difficulty, swelling noted around vocal cords. Given 1L Bolus. Started on Levophed gtt. PCCM asked to admit.    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 To his knowledge she has never attempted or wanted to attempt to harm herself. However since retiring she has voiced feeling un-needed and useless.   Past Medical History  Anxiety, Depression, ETOH   Significant Hospital Events   10-12 > Presents to ED   Consults: date of consult/date signed off & final recs:  PCCM  Procedures (surgical and bedside):  ETT 10/12 >> CVC 10/12 >>   Significant Diagnostic Tests:  CXR 10/12 > 1. Endotracheal tube tip in the proximal right mainstem bronchus. Retraction by 4 cm recommended. 2. Nasogastric tube side port below the diaphragm but not clearly visualized. Dedicated abdominal radiograph may be helpful. 3. Large area of right parahilar and medial right lung apex consolidation which may indicate infection or neoplastic process. Chest CT or Followup PA and lateral chest X-ray is recommended in 3-4 weeks following trial of antibiotic therapy to  ensure resolution and exclude underlying malignancy. CT Head/C-Spine 10/12 > 1. No acute intracranial abnormality. 2. No acute fracture of the cervical spine. 3. Large area of right upper lobe consolidation, possibly secondary to aspiration.  TTE 11/12 >> EF 60 to 65%, normal RV, estimated PASP 44 mmHg  Micro Data:  Blood 10/12 >> U/A 10/12 >>  Resp 10/12 >> GPC >>   Antimicrobials:  Zosyn 10/12 >>   Vanco 10/13 >>   Subjective:  Progressive hypoxemia and shock over the last 4 hours Note respiratory acidosis overnight, required single dose paralytic Vasopressin added, norepinephrine dose currently 33 Fentanyl up to 175  Objective   Blood pressure 94/70, pulse (!) 118, temperature 100.2 F (37.9 C), resp. rate (!) 30, height 5\' 2"  (1.575 m), weight 57.8 kg, SpO2 98 %. CVP:  [14 mmHg] 14 mmHg  Vent Mode: PRVC FiO2 (%):  [80 %-100 %] 80 % Set Rate:  [20 bmp-30 bmp] 30 bmp Vt Set:  [400 mL-500 mL] 500 mL PEEP:  [5 cmH20] 5 cmH20 Plateau Pressure:  [25 cmH20] 25 cmH20   Intake/Output Summary (Last 24 hours) at 05/24/2018 0838 Last data filed at 05/24/2018 0500 Gross per 24 hour  Intake 5082.09 ml  Output 1760 ml  Net 3322.09 ml   Filed Weights   05/23/18 0520 05/23/18 1048 05/24/18 0425  Weight: 59 kg 56.4 kg 57.8 kg    Examination: General: Acutely ill-appearing woman, intubated HENT: Mild icterus, ET tube in place, no oral lesions Lungs: Coarse bilaterally, crackles on the right, no wheeze Cardiovascular:  Tachycardic, no murmur Abdomen: Soft, nondistended with positive bowel sounds Extremities: No significant edema Neuro: Grimace with stimulation, attempt to open eyes, moves all extremities spontaneously but did not follow commands GU: Foley in place  Resolved Hospital Problem list     Assessment & Plan:   Acute Hypoxic Respiratory Distress in setting of toxic ingestion, aspiration PNA High risk ARDS although currently infiltrates are principally  unilateral CXR with large area of right parahilar and medial right lung apex consolidation  Plan  -Continue PRVC.  Her tidal volumes were increased to 500 cc 9/12 due to inability to adequately ventilate.  I believe we need to get her back down to at least 8 cc/kg.  She is at risk for ARDS and we may need to try and decrease further if ventilation will allow. -Will need to add PEEP in order to wean FiO2, 10/13 -Initiate paralysis protocol at least for 24 hours to improve ventilation and oxygenation, hopefully wean PEEP -Follow chest x-ray, ABG -VAP prevention orders  Septic Shock, progressively worse on 10/12 Questionable Cardiac Arrest (PEA) due to Overdose  Prolonged QTC -Husband was unable to palpate pulse, when EMS arrived patient was hypotension with pulse  -Echocardiogram reassuring Plan  -Continue telemetry monitoring -Norepinephrine needs worsened on 10/12, CBC placed, current dose -Should be room for further volume resuscitation -Follow ECG, QTC  Leukopenia, suspect ALPS - supportive care; unfortunately poor prognostic sign   Hypokalemia, improved actually a bit high 5.1 Non-Gap Metabolic Acidosis with coexisting lactic Acidosis-clearing, improving Plan  -Continue bicarbonate drip for now, non-gap metabolic and respiratory acidosis persist.  Attempt to decrease 10/14 -Follow BMP and replace electrolytes as indicated  Aspiration PNA, GPC on respiratory culture Plan  -Continue Zosyn -Add vancomycin 10/13 based on culture data -Follow respiratory cultures to completion and tailor antibiotics accordingly  Overdose, benzo and Zanaflex plus alcohol -Acetaminophen <10 -Salicylate <7 -ETOH 63  -CT Head/C-Spine negative  -UDS >> benzos H/O Anxiety/Depression, ETOH  Plan  -Continue fentanyl for sedation as ordered -Will require psychiatry consultation, safety sitter once she is progressing -Continue folic acid, thiamine   Disposition / Summary of Today's Plan 05/24/18    Add bicarbonate Wean norepinephrine Continue antibiotics for right upper lobe pneumonia Wean FiO2    Diet: NPO Pain/Anxiety/Delirium protocol : Fentanyl drip, Versed drip, add paralysis 10/13 VAP protocol: In place DVT prophylaxis: Heparin  GI prophylaxis: PPI Hyperglycemia protocol Mobility: Bedrest  Code Status: Full Code  Family Communication: Status and plans discussed with son, husband at bedside 10/13  Labs   CBC: Recent Labs  Lab 05/23/18 0555 05/24/18 0238  WBC 2.8* 2.4*  NEUTROABS 2.3  --   HGB 9.7* 10.1*  HCT 30.9* 33.2*  MCV 82.4 84.1  PLT 245 243    Basic Metabolic Panel: Recent Labs  Lab 05/23/18 0555 05/23/18 1052 05/23/18 1807 05/24/18 0238  NA 142  --  141 139  K 2.4*  --  4.3 5.1  CL 112*  --  113* 109  CO2 20*  --  22 26  GLUCOSE 111*  --  129* 145*  BUN 13  --  13 13  CREATININE 0.81  --  0.84 0.79  CALCIUM 7.5*  --  7.1* 7.1*  MG  --  1.4*  --  2.1  PHOS  --   --   --  3.8   GFR: Estimated Creatinine Clearance: 55.4 mL/min (by C-G formula based on SCr of 0.79 mg/dL). Recent Labs  Lab 05/23/18 0502 05/23/18 0555 05/23/18  1052 05/24/18 0238  PROCALCITON  --  <0.10  --  21.72  WBC  --  2.8*  --  2.4*  LATICACIDVEN 3.21* 2.5* 1.4  --     Liver Function Tests: Recent Labs  Lab 05/23/18 0555  AST 28  ALT 17  ALKPHOS 50  BILITOT 0.4  PROT 4.5*  ALBUMIN 2.5*   No results for input(s): LIPASE, AMYLASE in the last 168 hours. Recent Labs  Lab 05/23/18 0555  AMMONIA 36*    ABG    Component Value Date/Time   PHART 7.248 (L) 05/24/2018 0725   PCO2ART 58.4 (H) 05/24/2018 0725   PO2ART 126.0 (H) 05/24/2018 0725   HCO3 25.3 05/24/2018 0725   TCO2 27 05/24/2018 0725   ACIDBASEDEF 2.0 05/24/2018 0725   O2SAT 98.0 05/24/2018 0725     Coagulation Profile: Recent Labs  Lab 05/23/18 0555  INR 1.06    Cardiac Enzymes: Recent Labs  Lab 05/23/18 0555 05/23/18 1052 05/23/18 1807  CKTOTAL  --  508*  --   TROPONINI  <0.03 <0.03 0.03*    HbA1C: No results found for: HGBA1C  CBG: Recent Labs  Lab 05/23/18 1230 05/23/18 1756 05/23/18 2003 05/24/18 0103 05/24/18 0755  GLUCAP 110* 137* 137* 130* 169*   Independent CC time 35 minutes  Levy Pupa, MD, PhD 05/24/2018, 8:38 AM  [facility] [phone] or if no answer [phone]

## 2018-05-24 NOTE — Progress Notes (Addendum)
Pharmacy Antibiotic Note  Courtney Beard is a 67 y.o. female admitted on 05/23/2018 with suspected overdose and possible aspiration PNA. Currently on Zosyn with TA cx now growing few GPC. Pharmacy has now been consulted for vancomycin dosing. WBC down 2.4, current temp 100.4 (tmax 101.3). Scr stable < 1, estimated CrCl ~55 mL/min.  Plan: Continue Zosyn 3.375g IV q8h Vancomycin 1000mg  IV x1, followed by vancomycin 500mg  IV q12h Given negative MRSA PCR, MRSA PNA is unlikely - f/u cx results and de-escalate antibiotics as appropriate F/u clinical status, renal function, LOT, vancomycin levels as appropriate  Height: 5\' 2"  (157.5 cm) Weight: 127 lb 6.8 oz (57.8 kg) IBW/kg (Calculated) : 50.1  Temp (24hrs), Avg:99.5 F (37.5 C), Min:98.1 F (36.7 C), Max:101.3 F (38.5 C)  Recent Labs  Lab 05/23/18 0502 05/23/18 0555 05/23/18 1052 05/23/18 1807 05/24/18 0238  WBC  --  2.8*  --   --  2.4*  CREATININE  --  0.81  --  0.84 0.79  LATICACIDVEN 3.21* 2.5* 1.4  --   --     Estimated Creatinine Clearance: 55.4 mL/min (by C-G formula based on SCr of 0.79 mg/dL).   Not on File  Antimicrobials this admission: Zosyn 10/12 >> Vancomycin 10/13 >>  Microbiology results: 10/12 Bcx: NG x1 10/12 TA cx: GPC 10/12 MRSA PCR: neg  Thank you for allowing pharmacy to be a part of this patient's care.  Roderic Scarce Zigmund Daniel, PharmD PGY2 Infectious Diseases Pharmacy Resident Phone: [phone] 05/24/2018 9:46 AM

## 2018-05-24 NOTE — Progress Notes (Signed)
Dr. Delton Coombes was called with ABG results. Dr. Delton Coombes ordered to increase the RR to 35, wean fio2 for pao2 of 60 and repeat ABG at 1700.

## 2018-05-24 NOTE — Progress Notes (Signed)
eLink Physician-Brief Progress Note Patient Name: Courtney Beard DOB: 13-Jun-1951 MRN: [id number]   Date of Service  05/24/2018  HPI/Events of Note  Resp acidosis  eICU Interventions  Increase in TV and RR Since pH less than 7.2 additional bicarb and increase in bicarb gtt rate Recheck ABG in 2 hours     Intervention Category Major Interventions: Acid-Base disturbance - evaluation and management  Jaqulyn Chancellor 05/24/2018, 4:37 AM

## 2018-05-25 ENCOUNTER — Inpatient Hospital Stay (HOSPITAL_COMMUNITY): Payer: Medicare HMO

## 2018-05-25 DIAGNOSIS — T50902A Poisoning by unspecified drugs, medicaments and biological substances, intentional self-harm, initial encounter: Secondary | ICD-10-CM

## 2018-05-25 LAB — BASIC METABOLIC PANEL
ANION GAP: 6 (ref 5–15)
BUN: 14 mg/dL (ref 8–23)
CALCIUM: 6.9 mg/dL — AB (ref 8.9–10.3)
CHLORIDE: 104 mmol/L (ref 98–111)
CO2: 28 mmol/L (ref 22–32)
Creatinine, Ser: 0.76 mg/dL (ref 0.44–1.00)
GFR calc Af Amer: >60 mL/min (ref >60)
GFR calc non Af Amer: >60 mL/min (ref >60)
GLUCOSE: 164 mg/dL — AB (ref 70–99)
Potassium: 4.1 mmol/L (ref 3.5–5.1)
Sodium: 138 mmol/L (ref 135–145)

## 2018-05-25 LAB — POCT I-STAT 3, ART BLOOD GAS (G3+)
Acid-Base Excess: 7 mmol/L — ABNORMAL HIGH (ref 0.0–2.0)
Bicarbonate: 31.6 mmol/L — ABNORMAL HIGH (ref 20.0–28.0)
O2 SAT: 94 %
PCO2 ART: 49.8 mmHg — AB (ref 32.0–48.0)
PH ART: 7.415 (ref 7.350–7.450)
PO2 ART: 77 mmHg — AB (ref 83.0–108.0)
Patient temperature: 100.7
TCO2: 33 mmol/L — ABNORMAL HIGH (ref 22–32)

## 2018-05-25 LAB — CULTURE, RESPIRATORY: CULTURE: NORMAL

## 2018-05-25 LAB — PROCALCITONIN: Procalcitonin: 12.24 ng/mL

## 2018-05-25 LAB — MAGNESIUM: Magnesium: 2 mg/dL (ref 1.7–2.4)

## 2018-05-25 LAB — CBC
HCT: 26.1 % — ABNORMAL LOW (ref 36.0–46.0)
HEMOGLOBIN: 8.4 g/dL — AB (ref 12.0–15.0)
MCH: 26 pg (ref 26.0–34.0)
MCHC: 32.2 g/dL (ref 30.0–36.0)
MCV: 80.8 fL (ref 80.0–100.0)
Platelets: 208 10*3/uL (ref 150–400)
RBC: 3.23 MIL/uL — ABNORMAL LOW (ref 3.87–5.11)
RDW: 17.9 % — ABNORMAL HIGH (ref 11.5–15.5)
WBC: 6.8 10*3/uL (ref 4.0–10.5)
nRBC: 0 % (ref 0.0–0.2)

## 2018-05-25 LAB — PHOSPHORUS: Phosphorus: 2 mg/dL — ABNORMAL LOW (ref 2.5–4.6)

## 2018-05-25 LAB — GLUCOSE, CAPILLARY
GLUCOSE-CAPILLARY: 160 mg/dL — AB (ref 70–99)
GLUCOSE-CAPILLARY: 145 mg/dL — AB (ref 70–99)
GLUCOSE-CAPILLARY: 115 mg/dL — AB (ref 70–99)
Glucose-Capillary: 158 mg/dL — ABNORMAL HIGH (ref 70–99)
Glucose-Capillary: 114 mg/dL — ABNORMAL HIGH (ref 70–99)

## 2018-05-25 LAB — CULTURE, RESPIRATORY W GRAM STAIN

## 2018-05-25 MED ORDER — VANCOMYCIN HCL IN DEXTROSE 750-5 MG/150ML-% IV SOLN: 750.0000 mg | Freq: Two times a day (BID) | INTRAVENOUS | Status: DC

## 2018-05-25 MED ORDER — SODIUM CHLORIDE 0.9 % IV BOLUS
1000.0000 mL | Freq: Once | INTRAVENOUS | Status: AC
  Administered 2018-05-26: 1000 mL via INTRAVENOUS

## 2018-05-25 MED ORDER — LACTATED RINGERS IV SOLN
INTRAVENOUS | Status: DC
  Administered 2018-05-25: 11:00:00 via INTRAVENOUS

## 2018-05-25 MED ORDER — POTASSIUM & SODIUM PHOSPHATES 280-160-250 MG PO PACK
1.0000 | PACK | Freq: Three times a day (TID) | ORAL | Status: AC
  Administered 2018-05-25 (×2): 1 via ORAL
  Filled 2018-05-25 (×2): qty 1

## 2018-05-25 MED ORDER — SODIUM CHLORIDE 3 % IN NEBU
4.0000 mL | INHALATION_SOLUTION | Freq: Two times a day (BID) | RESPIRATORY_TRACT | Status: AC
  Administered 2018-05-25 – 2018-05-27 (×6): 4 mL via RESPIRATORY_TRACT
  Filled 2018-05-25 (×6): qty 4

## 2018-05-25 NOTE — Progress Notes (Signed)
RT NOTE: RT administered hypertonic saline via metaneb as ordered: patient did not tolerate well and had increase in HR and decrease in sats. Per MD metaneb will be discontinued for now. FIO2 increased to 80% as sats dropped to around 88-89%. RT will decrease FIO2 as patient recovers from derecruitment. Vitals are stable. RT will continue to monitor.

## 2018-05-25 NOTE — Progress Notes (Signed)
Baseline TOF unknown. Reassessed TOF "baseline" by starting with 15 amps receiving 0 twitches. Upped amps to 20 with 0 twitches. Received 4/4 twitches with 25 amps. We also repositioned the electrodes to the appropriate position according to clinical skills nursing procedures.  Darrold Span, RN   Precepting with Roslynn Amble.

## 2018-05-25 NOTE — Progress Notes (Signed)
Pharmacy Antibiotic Note  Courtney Beard is a 67 y.o. female admitted on 05/23/2018 with suspected overdose and possible aspiration PNA. Currently on Zosyn and vancomycin. WBC up 6.8, current temp 99.9 (tmax 101.3). Scr stable < 1, estimated CrCl ~62.5 mL/min, urine output increasing over the past 24 hours.   Plan: Continue Zosyn 3.375g IV q8h Increase vancomycin to 750 mg IV q12h Given negative MRSA PCR, MRSA PNA is unlikely - f/u cx results and de-escalate antibiotics as appropriate F/u clinical status, renal function, LOT, vancomycin levels as appropriate  Height: 5\' 2"  (157.5 cm) Weight: 145 lb 11.6 oz (66.1 kg) IBW/kg (Calculated) : 50.1  Temp (24hrs), Avg:100.2 F (37.9 C), Min:99.7 F (37.6 C), Max:101.1 F (38.4 C)  Recent Labs  Lab 05/23/18 0502 05/23/18 0555 05/23/18 1052 05/23/18 1807 05/24/18 0238 05/24/18 1743 05/25/18 0410  WBC  --  2.8*  --   --  2.4*  --  6.8  CREATININE  --  0.81  --  0.84 0.79 0.69 0.76  LATICACIDVEN 3.21* 2.5* 1.4  --   --   --   --     Estimated Creatinine Clearance: 62.5 mL/min (by C-G formula based on SCr of 0.76 mg/dL).   Not on File  Antimicrobials this admission: Zosyn 10/12 >> Vancomycin 10/13 >>  Microbiology results: 10/12 Bcx: NG x1 10/12 TA cx: GPC 10/12 MRSA PCR: neg  Thank you for allowing pharmacy to be a part of this patient's care.  Chauncey Mann, Pharmacy Student

## 2018-05-25 NOTE — Progress Notes (Signed)
NAME:  Courtney Beard, MRN:  [id number], DOB:  [birth year], LOS: 2 ADMISSION DATE:  05/23/2018, CONSULTATION DATE:  10/12 REFERRING MD:  Dr. Blinda Leatherwood, CHIEF COMPLAINT:  Overdose    History Present Illness   67 year old female presents s/p cardiac arrest on 10/12 following aspiration event found unresponsive, covered in emesis, empty bottle of Klonopin and Zanaflex.  Was pulseless, CPR initiated by husband, had blood pressure on EMS arrival  Past Medical History  Anxiety, Depression, ETOH   Significant Hospital Events   10-12: Presents to ED, mechanically ventilated, central access placed, placed on vasoactive drips, antibiotics initiated 10/13: Placed on neuromuscular blockade given escalating PEEP/FiO2 requirements.  Norepinephrine needs worsened.  Placed on bicarbonate infusion 10/14: Still on high FiO2 and PEEP.  Left a little better when reviewing chest x-ray but right side still with dense consolidation.  Still on high pressor requirements.  Hyperchloremia and acidosis improved.  Stopping bicarbonate infusion.  Adding hypertonic saline neb as well as MetaNeb  Consults: date of consult/date signed off & final recs:  PCCM  Procedures (surgical and bedside):  ETT 10/12 >> CVC 10/12 >>   Significant Diagnostic Tests:  CXR 10/12 > 1. Endotracheal tube tip in the proximal right mainstem bronchus. Retraction by 4 cm recommended. 2. Nasogastric tube side port below the diaphragm but not clearly visualized. Dedicated abdominal radiograph may be helpful. 3. Large area of right parahilar and medial right lung apex consolidation which may indicate infection or neoplastic process. Chest CT or Followup PA and lateral chest X-ray is recommended in 3-4 weeks following trial of antibiotic therapy to ensure resolution and exclude underlying malignancy. CT Head/C-Spine 10/12 > 1. No acute intracranial abnormality.2. No acute fracture of the cervical spine. 3. Large area of right upper lobe  consolidation, possibly secondary to aspiration. TTE 11/12 >> EF 60 to 65%, normal RV, estimated PASP 44 mmHg  Micro Data:  Blood 10/12 >> U/A 10/12 >>  Resp 10/12 >> GPC >>   Antimicrobials:  Zosyn 10/12 >>   Vanco 10/13 >>   Subjective:  Remains on full ventilator support.  Sedated so not able to get subjective assessment  Objective   Blood pressure 100/72, pulse (Abnormal) 103, temperature 100 F (37.8 C), resp. rate (Abnormal) 35, height 5\' 2"  (1.575 m), weight 66.1 kg, SpO2 94 %. CVP:  [14 mmHg-18 mmHg] 15 mmHg  Vent Mode: PRVC FiO2 (%):  [60 %-80 %] 60 % Set Rate:  [35 bmp] 35 bmp Vt Set:  [400 mL] 400 mL PEEP:  [12 cmH20] 12 cmH20 Plateau Pressure:  [21 cmH20-28 cmH20] 28 cmH20   Intake/Output Summary (Last 24 hours) at 05/25/2018 1105 Last data filed at 05/25/2018 1000 Gross per 24 hour  Intake 5394.29 ml  Output 1120 ml  Net 4274.29 ml   Filed Weights   05/23/18 1048 05/24/18 0425 05/25/18 0530  Weight: 56.4 kg 57.8 kg 66.1 kg    Examination: General: 67 year old critically ill female currently on full ventilator support heavily sedated while on neuromuscular blockade HEENT normocephalic atraumatic no jugular venous distention mucous membranes are moist Pulmonary: Scattered rhonchi right greater than left no accessory use Cardiac: Regular rate and rhythm Abdomen: Soft nontender Extremities: Diffuse anasarca strong pulses brisk capillary refill GU: Clear yellow Neuro: Heavily sedated GCS 3 on heavily sedating medications  Resolved Hospital Problem list   Lactic acidosis Hyperkalemia Non-anion gap metabolic acidosis  Assessment & Plan:   Acute Hypoxic Respiratory Distress in setting of toxic ingestion, aspiration PNA High  risk ARDS although currently infiltrates are principally unilateral  portable chest x-ray personally reviewed: Endotracheal tube and central line are in satisfactory position.  She has a predominantly right sided airspace disease with  dense consolidation on the right side which persists Plan  Continue full ventilator support VAP bundle Day #3 antibiotics, currently on vancomycin and Zosyn, likely discontinue vancomycin on 10/15 given clear MRSA PCR.  Reluctant to stop currently given ongoing shock state Continue another 24 hours of neuromuscular blockade Add MetaNeb and hypertonic saline Repeat chest x-ray in a.m. Continue RASS goal -5 given ongoing neuromuscular blockade  Septic shock Questionable Cardiac Arrest (PEA) due to Overdose  Prolonged QTC -Husband was unable to palpate pulse, when EMS arrived patient was hypotension with pulse  -Echocardiogram reassuring -Remains on high dose pressors Plan  Keep euvolemic at this point Antibiotics as above Wean norepinephrine and vasopressin as tolerated Holding stress dose steroids currently given IV steroids  Relative adrenal deficiency Plan If remains on high pressor dosing on 10/15 we will add stress dose steroids, planning on discontinuing neuromuscular blockade at that point  Leukopenia, suspect ALPS Plan Continue to monitor  At risk for fluid and electrolyte imbalance -non-Anion gap metabolic acidosis is resolved Plan Trend chemistry  Discontinue sodium bicarb infusion  Acute toxic metabolic encephalopathy following overdose, benzo and Zanaflex plus alcohol H/O Anxiety/Depression, ETOH  -Not clear if this was intentional or not Plan  PAD protocol as indicated for ventilatory needs Will need psychiatric evaluation after extubation Continue folic acid and thiamine   Disposition / Summary of Today's Plan 05/25/18      Diet: NPO now on full dose tube feeding Pain/Anxiety/Delirium protocol : Fentanyl drip, Versed drip, add paralysis 10/13 VAP protocol: In place DVT prophylaxis: Heparin  GI prophylaxis: PPI Hyperglycemia protocol Mobility: Bedrest  Code Status: Full Code  Family Communication: Son and husband updated at bedside  Labs    CBC: Recent Labs  Lab 05/23/18 0555 05/24/18 0238 05/25/18 0410  WBC 2.8* 2.4* 6.8  NEUTROABS 2.3  --   --   HGB 9.7* 10.1* 8.4*  HCT 30.9* 33.2* 26.1*  MCV 82.4 84.1 80.8  PLT 245 243 208    Basic Metabolic Panel: Recent Labs  Lab 05/23/18 0555 05/23/18 1052 05/23/18 1807 05/24/18 0238 05/24/18 1743 05/25/18 0410  NA 142  --  141 139 136 138  K 2.4*  --  4.3 5.1 4.9 4.1  CL 112*  --  113* 109 105 104  CO2 20*  --  22 26 26 28   GLUCOSE 111*  --  129* 145* 185* 164*  BUN 13  --  13 13 15 14   CREATININE 0.81  --  0.84 0.79 0.69 0.76  CALCIUM 7.5*  --  7.1* 7.1* 6.7* 6.9*  MG  --  1.4*  --  2.1  --  2.0  PHOS  --   --   --  3.8  --  2.0*   GFR: Estimated Creatinine Clearance: 62.5 mL/min (by C-G formula based on SCr of 0.76 mg/dL). Recent Labs  Lab 05/23/18 0502 05/23/18 0555 05/23/18 1052 05/24/18 0238 05/25/18 0410  PROCALCITON  --  <0.10  --  21.72 12.24  WBC  --  2.8*  --  2.4* 6.8  LATICACIDVEN 3.21* 2.5* 1.4  --   --     Liver Function Tests: Recent Labs  Lab 05/23/18 0555  AST 28  ALT 17  ALKPHOS 50  BILITOT 0.4  PROT 4.5*  ALBUMIN 2.5*  No results for input(s): LIPASE, AMYLASE in the last 168 hours. Recent Labs  Lab 05/23/18 0555  AMMONIA 36*    ABG    Component Value Date/Time   PHART 7.415 05/25/2018 0421   PCO2ART 49.8 (H) 05/25/2018 0421   PO2ART 77.0 (L) 05/25/2018 0421   HCO3 31.6 (H) 05/25/2018 0421   TCO2 33 (H) 05/25/2018 0421   ACIDBASEDEF 1.0 05/24/2018 1130   O2SAT 94.0 05/25/2018 0421     Coagulation Profile: Recent Labs  Lab 05/23/18 0555  INR 1.06    Cardiac Enzymes: Recent Labs  Lab 05/23/18 0555 05/23/18 1052 05/23/18 1807  CKTOTAL  --  508*  --   TROPONINI <0.03 <0.03 0.03*    HbA1C: No results found for: HGBA1C  CBG: Recent Labs  Lab 05/24/18 1549 05/24/18 1944 05/24/18 2331 05/25/18 0403 05/25/18 0722  GLUCAP 145* 155* 155* 160* 158*   Critical care x36 minutes Simonne Martinet  ACNP-BC [facility] Pulmonary/Critical Care Pager # [phone] OR # [phone] if no answer

## 2018-05-26 ENCOUNTER — Inpatient Hospital Stay (HOSPITAL_COMMUNITY): Payer: Medicare HMO

## 2018-05-26 DIAGNOSIS — R6521 Severe sepsis with septic shock: Secondary | ICD-10-CM

## 2018-05-26 DIAGNOSIS — J80 Acute respiratory distress syndrome: Secondary | ICD-10-CM

## 2018-05-26 DIAGNOSIS — A419 Sepsis, unspecified organism: Secondary | ICD-10-CM

## 2018-05-26 LAB — CBC
HEMATOCRIT: 27.2 % — AB (ref 36.0–46.0)
HEMOGLOBIN: 8.4 g/dL — AB (ref 12.0–15.0)
MCH: 25.1 pg — AB (ref 26.0–34.0)
MCHC: 30.9 g/dL (ref 30.0–36.0)
MCV: 81.4 fL (ref 80.0–100.0)
Platelets: 197 10*3/uL (ref 150–400)
RBC: 3.34 MIL/uL — ABNORMAL LOW (ref 3.87–5.11)
RDW: 18.1 % — ABNORMAL HIGH (ref 11.5–15.5)
WBC: 7.3 10*3/uL (ref 4.0–10.5)
nRBC: 0.3 % — ABNORMAL HIGH (ref 0.0–0.2)

## 2018-05-26 LAB — GLUCOSE, CAPILLARY
GLUCOSE-CAPILLARY: 103 mg/dL — AB (ref 70–99)
GLUCOSE-CAPILLARY: 116 mg/dL — AB (ref 70–99)
GLUCOSE-CAPILLARY: 133 mg/dL — AB (ref 70–99)
Glucose-Capillary: 113 mg/dL — ABNORMAL HIGH (ref 70–99)
Glucose-Capillary: 118 mg/dL — ABNORMAL HIGH (ref 70–99)
Glucose-Capillary: 120 mg/dL — ABNORMAL HIGH (ref 70–99)
Glucose-Capillary: 91 mg/dL (ref 70–99)

## 2018-05-26 LAB — BASIC METABOLIC PANEL
Anion gap: 8 (ref 5–15)
BUN: 20 mg/dL (ref 8–23)
CHLORIDE: 103 mmol/L (ref 98–111)
CO2: 29 mmol/L (ref 22–32)
Calcium: 8.2 mg/dL — ABNORMAL LOW (ref 8.9–10.3)
Creatinine, Ser: 0.59 mg/dL (ref 0.44–1.00)
GFR calc non Af Amer: >60 mL/min (ref >60)
GLUCOSE: 114 mg/dL — AB (ref 70–99)
Potassium: 3.8 mmol/L (ref 3.5–5.1)
Sodium: 140 mmol/L (ref 135–145)

## 2018-05-26 LAB — MAGNESIUM: MAGNESIUM: 1.9 mg/dL (ref 1.7–2.4)

## 2018-05-26 LAB — PHOSPHORUS: PHOSPHORUS: 2.3 mg/dL — AB (ref 2.5–4.6)

## 2018-05-26 MED ORDER — POTASSIUM CHLORIDE 20 MEQ/15ML (10%) PO SOLN
40.0000 meq | Freq: Once | ORAL | Status: AC
  Administered 2018-05-26: 40 meq
  Filled 2018-05-26: qty 30

## 2018-05-26 MED ORDER — FUROSEMIDE 10 MG/ML IJ SOLN
40.0000 mg | Freq: Once | INTRAMUSCULAR | Status: AC
  Administered 2018-05-26: 40 mg via INTRAVENOUS
  Filled 2018-05-26: qty 4

## 2018-05-26 MED ORDER — SODIUM CHLORIDE 0.9 % IV SOLN
1.0000 g | INTRAVENOUS | Status: DC
  Administered 2018-05-26 – 2018-05-30 (×5): 1 g via INTRAVENOUS
  Filled 2018-05-26 (×6): qty 10

## 2018-05-26 MED ORDER — POTASSIUM & SODIUM PHOSPHATES 280-160-250 MG PO PACK
1.0000 | PACK | Freq: Three times a day (TID) | ORAL | Status: AC
  Administered 2018-05-26 (×2): 1 via ORAL
  Filled 2018-05-26 (×2): qty 1

## 2018-05-26 MED ORDER — LABETALOL HCL 5 MG/ML IV SOLN
20.0000 mg | INTRAVENOUS | Status: DC | PRN
  Administered 2018-05-26: 20 mg via INTRAVENOUS
  Filled 2018-05-26 (×2): qty 4

## 2018-05-26 NOTE — Progress Notes (Addendum)
NAME:  Courtney Beard, MRN:  [id number], DOB:  23-Oct-1950, LOS: 3 ADMISSION DATE:  05/23/2018, CONSULTATION DATE:  10/12 REFERRING MD:  Dr. Blinda Leatherwood, CHIEF COMPLAINT:  Overdose    History Present Illness   67 year old female presents s/p cardiac arrest on 10/12 following aspiration event found unresponsive, covered in emesis, empty bottle of Klonopin and Zanaflex.  Was pulseless, CPR initiated by husband, had blood pressure on EMS arrival  Past Medical History  Anxiety, Depression, ETOH   Significant Hospital Events   10-12: Presents to ED, mechanically ventilated, central access placed, placed on vasoactive drips, antibiotics initiated 10/13: Placed on neuromuscular blockade given escalating PEEP/FiO2 requirements.  Norepinephrine needs worsened.  Placed on bicarbonate infusion 10/14: Still on high FiO2 and PEEP.  Left a little better when reviewing chest x-ray but right side still with dense consolidation.  Still on high pressor requirements.  Hyperchloremia and acidosis improved.  Stopping bicarbonate infusion.  Adding hypertonic saline neb as well as MetaNeb 10/15: She de-recruited during MetaNeb attempts so this was discontinued.  Shock resolved, BP elevated endotracheal tube at carina required repositioning chest x-ray worse requiring increased FiO2  Consults: date of consult/date signed off & final recs:  PCCM  Procedures (surgical and bedside):  ETT 10/12 >> CVC 10/12 >>   Significant Diagnostic Tests:  CXR 10/12 > 1. Endotracheal tube tip in the proximal right mainstem bronchus. Retraction by 4 cm recommended. 2. Nasogastric tube side port below the diaphragm but not clearly visualized. Dedicated abdominal radiograph may be helpful. 3. Large area of right parahilar and medial right lung apex consolidation which may indicate infection or neoplastic process. Chest CT or Followup PA and lateral chest X-ray is recommended in 3-4 weeks following trial of antibiotic therapy  to ensure resolution and exclude underlying malignancy. CT Head/C-Spine 10/12 > 1. No acute intracranial abnormality.2. No acute fracture of the cervical spine. 3. Large area of right upper lobe consolidation, possibly secondary to aspiration. TTE 11/12 >> EF 60 to 65%, normal RV, estimated PASP 44 mmHg  Micro Data:  Blood 10/12 >> U/A 10/12 >>  Resp 10/12: Normal flora  Antimicrobials:  Zosyn 10/12 >>   Vanco 10/13 >> 10/14  Subjective:  Still on full ventilator support, remains heavily sedated and on neuromuscular blockade  Objective   Blood pressure (Abnormal) 146/90, pulse (Abnormal) 112, temperature 98.1 F (36.7 C), resp. rate (Abnormal) 35, height 5\' 2"  (1.575 m), weight 69.1 kg, SpO2 96 %. CVP:  [12 mmHg-20 mmHg] 20 mmHg  Vent Mode: PRVC FiO2 (%):  [60 %-100 %] 80 % Set Rate:  [35 bmp] 35 bmp Vt Set:  [400 mL] 400 mL PEEP:  [12 cmH20] 12 cmH20 Plateau Pressure:  [26 cmH20-30 cmH20] 30 cmH20   Intake/Output Summary (Last 24 hours) at 05/26/2018 0932 Last data filed at 05/26/2018 0800 Gross per 24 hour  Intake 4197.32 ml  Output 1655 ml  Net 2542.32 ml   Filed Weights   05/24/18 0425 05/25/18 0530 05/26/18 0500  Weight: 57.8 kg 66.1 kg 69.1 kg    Examination: General: 67 year old female currently sedated and on neuromuscular blockade HEENT normocephalic atraumatic does have some scleral edema mucous membranes are moist orally intubated no JVD Pulmonary: Coarse, right greater than left.  Plateau pressures 30 cmH2O Cardiac: Regular rate and rhythm tachycardic Abdomen: Soft, nontender no organomegaly positive bowel sounds Extremities: Generalized anasarca some weeping at insertion catheter sites brisk capillary refill, strong pulses Neuro remains heavily sedated  Resolved Hospital Problem list  Lactic acidosis Hyperkalemia Non-anion gap metabolic acidosis Septic shock resolved 10/15 Assessment & Plan:   Acute Hypoxic Respiratory Distress in setting of  toxic ingestion, aspiration PNA Portable chest x-ray personally reviewed: This demonstrates slight worsening of the right side with increased consolidation and right-sided airspace disease accounting for rotation.  Also worsening left-sided airspace disease.  Her endotracheal tube is at the level of the carina, close to right mainstem.  This needs to be retracted. Plan  Pull endotracheal tube back 2 cm  Continue full ventilator support with ARDS protocol  Lasix today  We will reassess later this afternoon we may need to consider prone inpatient  We will continue another 24 hours of neuromuscular blockade given her high FiO2 requirements.   #4 antibiotics, as we have not isolated any organism we can change to monotherapy with Rocephin Continue VAP bundle Continue hypertonic saline Repeat chest x-ray in a.m. RASS goal negative for to -5 given neuromuscular blockade   Questionable Cardiac Arrest (PEA) due to Overdose  Prolonged QTC -Husband was unable to palpate pulse, when EMS arrived patient was hypotension with pulse  -Echocardiogram reassuring -Remains on high dose pressors Plan  Continue telemetry monitoring Aiming for negative would balance  Relative adrenal deficiency Plan Did not require stress dose steroids  Leukopenia, suspect ALPS Plan Continue to monitor  At risk for fluid and electrolyte imbalance: hypophosphatemia  -non-Anion gap metabolic acidosis is resolved Plan Trend chemistry Lasix today Replace phos   Acute toxic metabolic encephalopathy following overdose, benzo and Zanaflex plus alcohol H/O Anxiety/Depression, ETOH  -Not clear if this was intentional or not Plan  ED protocol as indicated for ventilatory needs  Continue thiamine and folic acid    Disposition / Summary of Today's Plan 05/26/18   Still requires high PEEP and FiO2.  On a good note she is off vasoactive drips.  Sputum cultures are negative, will change to monotherapy with ceftriaxone.  We  will initiate Lasix today.  We will see how she looks later this afternoon we may need to consider providing patient for 24 to 48 hours   Diet: NPO now on full dose tube feeding Pain/Anxiety/Delirium protocol : Fentanyl drip, Versed drip, add paralysis 10/13 VAP protocol: In place DVT prophylaxis: Heparin  GI prophylaxis: PPI Hyperglycemia protocol Mobility: Bedrest  Code Status: Full Code  Family Communication: Son and husband updated at bedside  Labs   CBC: Recent Labs  Lab 05/23/18 0555 05/24/18 0238 05/25/18 0410 05/26/18 0445  WBC 2.8* 2.4* 6.8 7.3  NEUTROABS 2.3  --   --   --   HGB 9.7* 10.1* 8.4* 8.4*  HCT 30.9* 33.2* 26.1* 27.2*  MCV 82.4 84.1 80.8 81.4  PLT 245 243 208 197    Basic Metabolic Panel: Recent Labs  Lab 05/23/18 1052 05/23/18 1807 05/24/18 0238 05/24/18 1743 05/25/18 0410 05/26/18 0445  NA  --  141 139 136 138 140  K  --  4.3 5.1 4.9 4.1 3.8  CL  --  113* 109 105 104 103  CO2  --  22 26 26 28 29   GLUCOSE  --  129* 145* 185* 164* 114*  BUN  --  13 13 15 14 20   CREATININE  --  0.84 0.79 0.69 0.76 0.59  CALCIUM  --  7.1* 7.1* 6.7* 6.9* 8.2*  MG 1.4*  --  2.1  --  2.0 1.9  PHOS  --   --  3.8  --  2.0* 2.3*   GFR: Estimated Creatinine Clearance:  63.9 mL/min (by C-G formula based on SCr of 0.59 mg/dL). Recent Labs  Lab 05/23/18 0502 05/23/18 0555 05/23/18 1052 05/24/18 0238 05/25/18 0410 05/26/18 0445  PROCALCITON  --  <0.10  --  21.72 12.24  --   WBC  --  2.8*  --  2.4* 6.8 7.3  LATICACIDVEN 3.21* 2.5* 1.4  --   --   --     Liver Function Tests: Recent Labs  Lab 05/23/18 0555  AST 28  ALT 17  ALKPHOS 50  BILITOT 0.4  PROT 4.5*  ALBUMIN 2.5*   No results for input(s): LIPASE, AMYLASE in the last 168 hours. Recent Labs  Lab 05/23/18 0555  AMMONIA 36*    ABG    Component Value Date/Time   PHART 7.415 05/25/2018 0421   PCO2ART 49.8 (H) 05/25/2018 0421   PO2ART 77.0 (L) 05/25/2018 0421   HCO3 31.6 (H) 05/25/2018 0421    TCO2 33 (H) 05/25/2018 0421   ACIDBASEDEF 1.0 05/24/2018 1130   O2SAT 94.0 05/25/2018 0421     Coagulation Profile: Recent Labs  Lab 05/23/18 0555  INR 1.06    Cardiac Enzymes: Recent Labs  Lab 05/23/18 0555 05/23/18 1052 05/23/18 1807  CKTOTAL  --  508*  --   TROPONINI <0.03 <0.03 0.03*    HbA1C: No results found for: HGBA1C  CBG: Recent Labs  Lab 05/25/18 1533 05/25/18 1944 05/26/18 0029 05/26/18 0455 05/26/18 0750  GLUCAP 115* 114* 118* 103* 116*  Care x35 minutes Simonne Martinet ACNP-BC [facility] Pulmonary/Critical Care Pager # [phone] OR # [phone] if no answer

## 2018-05-26 NOTE — Progress Notes (Signed)
eLink Physician-Brief Progress Note Patient Name: Courtney Beard DOB: Jun 17, 1951 MRN: [id number]   Date of Service  05/26/2018  HPI/Events of Note  Elevated HR - Rate = 131. Looks like sinus tachycardia on beside monitor. Not currently febrile. On NMB and sedation increased without improvement. Hgb = 8.4. CVP = 13 and LVEF = 60-65%.  eICU Interventions  Will bolus with 0.9 NaCl 1 liter IV over 1 hour now.      Intervention Category Major Interventions: Arrhythmia - evaluation and management  Sommer,Steven Eugene 05/26/2018, 12:00 AM

## 2018-05-26 NOTE — Progress Notes (Signed)
Pharmacy Antibiotic Note  Courtney Beard is a 67 y.o. female admitted on 05/23/2018 with suspected overdose and possible aspiration PNA. Patient was started on Zosyn and vancomycin. Vanc was d/c'ed yesterday. Now de-escalating from Zosyn to ceftriaxone.   WBC wnl, Afebrile. D#4 of antibiotics. Culture negative so far   Plan: Stop Zosyn. Start ceftriaxone 1 gm IV Q 24 hours Monitor cultures and clinical progress Pharmacy to sign off and follow peripherally since no further dosage adjustments necessary    Height: 5\' 2"  (157.5 cm) Weight: 152 lb 5.4 oz (69.1 kg) IBW/kg (Calculated) : 50.1  Temp (24hrs), Avg:99.2 F (37.3 C), Min:97.7 F (36.5 C), Max:100 F (37.8 C)  Recent Labs  Lab 05/23/18 0502  05/23/18 0555 05/23/18 1052 05/23/18 1807 05/24/18 0238 05/24/18 1743 05/25/18 0410 05/26/18 0445  WBC  --   --  2.8*  --   --  2.4*  --  6.8 7.3  CREATININE  --    < > 0.81  --  0.84 0.79 0.69 0.76 0.59  LATICACIDVEN 3.21*  --  2.5* 1.4  --   --   --   --   --    < > = values in this interval not displayed.    Estimated Creatinine Clearance: 63.9 mL/min (by C-G formula based on SCr of 0.59 mg/dL).   Not on File  Antimicrobials this admission: Zosyn 10/12 >> Vancomycin 10/13 >>  Microbiology results: 10/12 Bcx: NG x2 10/12 TA cx: GPC 10/12 MRSA PCR: neg  Thank you for allowing pharmacy to be a part of this patient's care.  Vinnie Level, PharmD., BCPS Clinical Pharmacist Clinical phone for 05/26/18 until 3:30pm: [phone] If after 3:30pm, please refer to [facility] for unit-specific pharmacist

## 2018-05-26 NOTE — Care Management Note (Signed)
Case Management Note Donn Pierini RN,BSN Transitions of Care Unit 85M - RN Case Manager [phone]  Patient Details  Name: Courtney Beard MRN: [id number] Date of Birth: 1950-12-29  Subjective/Objective:   Pt admitted s/p cardiac arrest with overdose after being found down at home. 10/15 remains on vent with acute hypoxic resp. Distress in setting of toxic ingestion and asp. Pna.                 Action/Plan: PTA pt lived at home with spouse, independent- CM and CSW to follow for transition of care needs  Expected Discharge Date:                  Expected Discharge Plan:     In-House Referral:  Clinical Social Work  Discharge planning Services  CM Consult  Post Acute Care Choice:    Choice offered to:     DME Arranged:    DME Agency:     HH Arranged:    HH Agency:     Status of Service:  In process, will continue to follow  If discussed at Long Length of Stay Meetings, dates discussed:    Discharge Disposition:   Additional Comments:  Darrold Span, RN 05/26/2018, 11:20 AM

## 2018-05-27 ENCOUNTER — Inpatient Hospital Stay (HOSPITAL_COMMUNITY): Payer: Medicare HMO

## 2018-05-27 LAB — CBC
HCT: 25.2 % — ABNORMAL LOW (ref 36.0–46.0)
HEMOGLOBIN: 8 g/dL — AB (ref 12.0–15.0)
MCH: 24.9 pg — AB (ref 26.0–34.0)
MCHC: 31.7 g/dL (ref 30.0–36.0)
MCV: 78.5 fL — ABNORMAL LOW (ref 80.0–100.0)
PLATELETS: 184 10*3/uL (ref 150–400)
RBC: 3.21 MIL/uL — AB (ref 3.87–5.11)
RDW: 17.4 % — ABNORMAL HIGH (ref 11.5–15.5)
WBC: 9.1 10*3/uL (ref 4.0–10.5)
nRBC: 0.7 % — ABNORMAL HIGH (ref 0.0–0.2)

## 2018-05-27 LAB — COMPREHENSIVE METABOLIC PANEL
ALBUMIN: 1.6 g/dL — AB (ref 3.5–5.0)
ALK PHOS: 80 U/L (ref 38–126)
ALT: 19 U/L (ref 0–44)
AST: 31 U/L (ref 15–41)
Anion gap: 7 (ref 5–15)
BUN: 23 mg/dL (ref 8–23)
CALCIUM: 8.6 mg/dL — AB (ref 8.9–10.3)
CHLORIDE: 102 mmol/L (ref 98–111)
CO2: 33 mmol/L — AB (ref 22–32)
CREATININE: 0.62 mg/dL (ref 0.44–1.00)
GFR calc Af Amer: >60 mL/min (ref >60)
GFR calc non Af Amer: >60 mL/min (ref >60)
GLUCOSE: 111 mg/dL — AB (ref 70–99)
Potassium: 3.8 mmol/L (ref 3.5–5.1)
SODIUM: 142 mmol/L (ref 135–145)
Total Bilirubin: 0.3 mg/dL (ref 0.3–1.2)
Total Protein: 4.8 g/dL — ABNORMAL LOW (ref 6.5–8.1)

## 2018-05-27 LAB — POCT I-STAT 3, ART BLOOD GAS (G3+)
Acid-Base Excess: 8 mmol/L — ABNORMAL HIGH (ref 0.0–2.0)
Bicarbonate: 33.1 mmol/L — ABNORMAL HIGH (ref 20.0–28.0)
O2 SAT: 97 %
PCO2 ART: 49.7 mmHg — AB (ref 32.0–48.0)
PO2 ART: 86 mmHg (ref 83.0–108.0)
Patient temperature: 98.6
TCO2: 35 mmol/L — AB (ref 22–32)
pH, Arterial: 7.432 (ref 7.350–7.450)

## 2018-05-27 LAB — GLUCOSE, CAPILLARY
GLUCOSE-CAPILLARY: 120 mg/dL — AB (ref 70–99)
Glucose-Capillary: 102 mg/dL — ABNORMAL HIGH (ref 70–99)
Glucose-Capillary: 89 mg/dL (ref 70–99)
Glucose-Capillary: 92 mg/dL (ref 70–99)
Glucose-Capillary: 94 mg/dL (ref 70–99)
Glucose-Capillary: 95 mg/dL (ref 70–99)

## 2018-05-27 LAB — PHOSPHORUS: PHOSPHORUS: 1.9 mg/dL — AB (ref 2.5–4.6)

## 2018-05-27 LAB — MAGNESIUM
Magnesium: 1.6 mg/dL — ABNORMAL LOW (ref 1.7–2.4)
Magnesium: 2.1 mg/dL (ref 1.7–2.4)

## 2018-05-27 MED ORDER — POLYETHYLENE GLYCOL 3350 17 G PO PACK
17.0000 g | PACK | Freq: Every day | ORAL | Status: DC
  Administered 2018-05-27 – 2018-05-29 (×3): 17 g
  Filled 2018-05-27 (×3): qty 1

## 2018-05-27 MED ORDER — VITAMIN B-1 100 MG PO TABS
100.0000 mg | ORAL_TABLET | Freq: Every day | ORAL | Status: DC
  Administered 2018-05-27 – 2018-06-19 (×24): 100 mg
  Filled 2018-05-27 (×24): qty 1

## 2018-05-27 MED ORDER — FUROSEMIDE 10 MG/ML IJ SOLN
40.0000 mg | Freq: Once | INTRAMUSCULAR | Status: AC
  Administered 2018-05-27: 40 mg via INTRAVENOUS
  Filled 2018-05-27: qty 4

## 2018-05-27 MED ORDER — POLYETHYLENE GLYCOL 3350 17 G PO PACK: 17.0000 g | PACK | Freq: Every day | ORAL | Status: DC

## 2018-05-27 MED ORDER — FOLIC ACID 1 MG PO TABS
1.0000 mg | ORAL_TABLET | Freq: Every day | ORAL | Status: DC
  Administered 2018-05-27 – 2018-06-19 (×24): 1 mg
  Filled 2018-05-27 (×24): qty 1

## 2018-05-27 MED ORDER — POTASSIUM PHOSPHATES 15 MMOLE/5ML IV SOLN
30.0000 mmol | Freq: Once | INTRAVENOUS | Status: AC
  Administered 2018-05-27: 30 mmol via INTRAVENOUS
  Filled 2018-05-27: qty 10

## 2018-05-27 MED ORDER — MAGNESIUM SULFATE 2 GM/50ML IV SOLN
2.0000 g | Freq: Once | INTRAVENOUS | Status: AC
  Administered 2018-05-27: 2 g via INTRAVENOUS
  Filled 2018-05-27: qty 50

## 2018-05-27 NOTE — Progress Notes (Signed)
NAME:  Courtney Beard, MRN:  [id number], DOB:  04/29/1951, LOS: 4 ADMISSION DATE:  05/23/2018, CONSULTATION DATE:  10/12 REFERRING MD:  Dr. Blinda Leatherwood, CHIEF COMPLAINT:  Overdose    History Present Illness   67 year old female presents s/p cardiac arrest on 10/12 following aspiration event found unresponsive, covered in emesis, empty bottle of Klonopin and Zanaflex.  Was pulseless, CPR initiated by husband, had blood pressure on EMS arrival  Past Medical History  Anxiety, Depression, ETOH   Significant Hospital Events   10-12: Presents to ED, mechanically ventilated, central access placed, placed on vasoactive drips, antibiotics initiated 10/13: Placed on neuromuscular blockade given escalating PEEP/FiO2 requirements.  Norepinephrine needs worsened.  Placed on bicarbonate infusion 10/14: Still on high FiO2 and PEEP.  Left a little better when reviewing chest x-ray but right side still with dense consolidation.  Still on high pressor requirements.  Hyperchloremia and acidosis improved.  Stopping bicarbonate infusion.  Adding hypertonic saline neb as well as MetaNeb 10/15: She de-recruited during MetaNeb attempts so this was discontinued.  Shock resolved, BP elevated endotracheal tube at carina required repositioning chest x-ray worse requiring increased FiO2 10/16>> continue attempts at diuresis.  Tolerating weaning peep/fiO2   Consults: date of consult/date signed off & final recs:  PCCM  Procedures (surgical and bedside):  ETT 10/12 >> CVC 10/12 >>   Significant Diagnostic Tests:  CXR 10/12 > 1. Endotracheal tube tip in the proximal right mainstem bronchus. Retraction by 4 cm recommended. 2. Nasogastric tube side port below the diaphragm but not clearly visualized. Dedicated abdominal radiograph may be helpful. 3. Large area of right parahilar and medial right lung apex consolidation which may indicate infection or neoplastic process. Chest CT or Followup PA and lateral chest  X-ray is recommended in 3-4 weeks following trial of antibiotic therapy to ensure resolution and exclude underlying malignancy. CT Head/C-Spine 10/12 > 1. No acute intracranial abnormality.2. No acute fracture of the cervical spine. 3. Large area of right upper lobe consolidation, possibly secondary to aspiration. TTE 11/12 >> EF 60 to 65%, normal RV, estimated PASP 44 mmHg  Micro Data:  Blood 10/12 >> U/A 10/12 >>  Resp 10/12>>Normal flora  Antimicrobials:  Zosyn 10/12 >> 10/15 Rocephin 10/15>>> Vanco 10/13 >> 10/14  Subjective/interval hx:  No acute change overnight.  Diuresed well yesterday  Peep 10, 40%   Objective   Blood pressure (!) 158/75, pulse (!) 101, temperature 100.2 F (37.9 C), resp. rate (!) 35, height 5\' 2"  (1.575 m), weight 67.2 kg, SpO2 (!) 89 %. CVP:  [14 mmHg-17 mmHg] 17 mmHg  Vent Mode: PRVC FiO2 (%):  [40 %-80 %] 40 % Set Rate:  [35 bmp] 35 bmp Vt Set:  [400 mL] 400 mL PEEP:  [12 cmH20] 12 cmH20 Plateau Pressure:  [28 cmH20-29 cmH20] 28 cmH20   Intake/Output Summary (Last 24 hours) at 05/27/2018 0910 Last data filed at 05/27/2018 0800 Gross per 24 hour  Intake 2216.89 ml  Output 3355 ml  Net -1138.11 ml   Filed Weights   05/25/18 0530 05/26/18 0500 05/27/18 0449  Weight: 66.1 kg 69.1 kg 67.2 kg    Examination: General: wdwn female, NAD on vent, sedated on NMB  HEENT mm moist, ETT, scleredema  Pulmonary: resps even non labored on vent on NMB, coarse R>L, few fine crackles  Cardiac: Regular rate and rhythm tachycardic Abdomen: Soft, nontender no organomegaly positive bowel sounds Extremities: warm and dry, generalized edema  Neuro remains heavily sedated on NMB  Resolved Hospital Problem list   Lactic acidosis Hyperkalemia Non-anion gap metabolic acidosis Septic shock resolved 10/15 Relative adrenal deficiency Assessment & Plan:   Acute Hypoxic Respiratory Distress in setting of toxic ingestion, aspiration PNA Portable chest x-ray  personally reviewed: slightly improved aeration R with ongoing diffuse airspace disease  Plan  Vent support - 8cc/kg  F/u CXR  F/u ABG ARDS protocol  If continues to tolerate less peep/Fi02 can consider trial off NMB  Repeat lasix today  D5/x abx - narrowed to rocephin 10/15 RASS goal negative for to -5 given neuromuscular blockade   Questionable Cardiac Arrest (PEA) due to Overdose  Prolonged QTC -Husband was unable to palpate pulse, when EMS arrived patient was hypotension with pulse  -Echocardiogram reassuring -shock resolved  Plan  Continue telemetry monitoring Repeat diuresis as above    Leukopenia, suspect ALPS Plan Continue to monitor  At risk for fluid and electrolyte imbalance: hypophosphatemia  -non-Anion gap metabolic acidosis is resolved Plan Trend chemistry Repeat lasix today  Replete K, Mg, Phos   Acute toxic metabolic encephalopathy following overdose, benzo and Zanaflex plus alcohol H/O Anxiety/Depression, ETOH  -Not clear if this was intentional or not Plan  ED protocol as indicated for ventilatory needs  Continue thiamine and folic acid    Disposition / Summary of Today's Plan 05/27/18   Vent needs improving slowly.  Much better than yesterday.  Remains off pressors.  Sputum culture negative.  Will repeat diuresis.  Consider trial off NMB.     Diet: NPO now on full dose tube feeding Pain/Anxiety/Delirium protocol : Fentanyl drip, Versed drip, add paralysis 10/13 VAP protocol: In place DVT prophylaxis: Heparin  GI prophylaxis: PPI Hyperglycemia protocol Mobility: Bedrest  Code Status: Full Code  Family Communication: Son and husband updated at bedside 10/16  Labs   CBC: Recent Labs  Lab 05/23/18 0555 05/24/18 0238 05/25/18 0410 05/26/18 0445 05/27/18 0450  WBC 2.8* 2.4* 6.8 7.3 9.1  NEUTROABS 2.3  --   --   --   --   HGB 9.7* 10.1* 8.4* 8.4* 8.0*  HCT 30.9* 33.2* 26.1* 27.2* 25.2*  MCV 82.4 84.1 80.8 81.4 78.5*  PLT 245 243 208 197  184    Basic Metabolic Panel: Recent Labs  Lab 05/23/18 1052  05/24/18 0238 05/24/18 1743 05/25/18 0410 05/26/18 0445 05/27/18 0450  NA  --    < > 139 136 138 140 142  K  --    < > 5.1 4.9 4.1 3.8 3.8  CL  --    < > 109 105 104 103 102  CO2  --    < > 26 26 28 29  33*  GLUCOSE  --    < > 145* 185* 164* 114* 111*  BUN  --    < > 13 15 14 20 23   CREATININE  --    < > 0.79 0.69 0.76 0.59 0.62  CALCIUM  --    < > 7.1* 6.7* 6.9* 8.2* 8.6*  MG 1.4*  --  2.1  --  2.0 1.9 1.6*  PHOS  --   --  3.8  --  2.0* 2.3* 1.9*   < > = values in this interval not displayed.   GFR: Estimated Creatinine Clearance: 63 mL/min (by C-G formula based on SCr of 0.62 mg/dL). Recent Labs  Lab 05/23/18 0502  05/23/18 0555 05/23/18 1052 05/24/18 0238 05/25/18 0410 05/26/18 0445 05/27/18 0450  PROCALCITON  --   --  <0.10  --  21.72  12.24  --   --   WBC  --    < > 2.8*  --  2.4* 6.8 7.3 9.1  LATICACIDVEN 3.21*  --  2.5* 1.4  --   --   --   --    < > = values in this interval not displayed.    Liver Function Tests: Recent Labs  Lab 05/23/18 0555 05/27/18 0450  AST 28 31  ALT 17 19  ALKPHOS 50 80  BILITOT 0.4 0.3  PROT 4.5* 4.8*  ALBUMIN 2.5* 1.6*   No results for input(s): LIPASE, AMYLASE in the last 168 hours. Recent Labs  Lab 05/23/18 0555  AMMONIA 36*    ABG    Component Value Date/Time   PHART 7.432 05/27/2018 0427   PCO2ART 49.7 (H) 05/27/2018 0427   PO2ART 86.0 05/27/2018 0427   HCO3 33.1 (H) 05/27/2018 0427   TCO2 35 (H) 05/27/2018 0427   ACIDBASEDEF 1.0 05/24/2018 1130   O2SAT 97.0 05/27/2018 0427     Coagulation Profile: Recent Labs  Lab 05/23/18 0555  INR 1.06    Cardiac Enzymes: Recent Labs  Lab 05/23/18 0555 05/23/18 1052 05/23/18 1807  CKTOTAL  --  508*  --   TROPONINI <0.03 <0.03 0.03*    HbA1C: No results found for: HGBA1C  CBG: Recent Labs  Lab 05/26/18 1634 05/26/18 1935 05/26/18 2327 05/27/18 0347 05/27/18 0721  GLUCAP 113* 120* 91  102* 95   Cc time   Dirk Dress, NP 05/27/2018  9:10 AM Pager: [phone] or [phone]

## 2018-05-27 NOTE — Progress Notes (Signed)
[facility] ADULT ICU REPLACEMENT PROTOCOL FOR AM LAB REPLACEMENT ONLY  The patient does apply for the [facility] Adult ICU Electrolyte Replacment Protocol based on the criteria listed below:   1. Is GFR >/= 40 ml/min? Yes.    Patient's GFR today is >60 2. Is urine output >/= 0.5 ml/kg/hr for the last 6 hours? Yes.   Patient's UOP is 1.26ml/kg/hr 3. Is BUN < 60 mg/dL? Yes.    Patient's BUN today is 23 4. Abnormal electrolyte(s): Mag -1.6 5. Ordered repletion with: per protocol 6. If a panic level lab has been reported, has the CCM MD in charge been notified? Yes.  .   Physician:  Dr. Janne Lab, Dixon Boos 05/27/2018 6:25 AM

## 2018-05-28 ENCOUNTER — Inpatient Hospital Stay (HOSPITAL_COMMUNITY): Payer: Medicare HMO

## 2018-05-28 DIAGNOSIS — T17908A Unspecified foreign body in respiratory tract, part unspecified causing other injury, initial encounter: Secondary | ICD-10-CM

## 2018-05-28 DIAGNOSIS — T50901A Poisoning by unspecified drugs, medicaments and biological substances, accidental (unintentional), initial encounter: Secondary | ICD-10-CM

## 2018-05-28 LAB — CBC
HCT: 21.9 % — ABNORMAL LOW (ref 36.0–46.0)
Hemoglobin: 7.4 g/dL — ABNORMAL LOW (ref 12.0–15.0)
MCH: 24.9 pg — ABNORMAL LOW (ref 26.0–34.0)
MCHC: 33.8 g/dL (ref 30.0–36.0)
MCV: 73.7 fL — ABNORMAL LOW (ref 80.0–100.0)
NRBC: 1.1 % — AB (ref 0.0–0.2)
Platelets: 183 10*3/uL (ref 150–400)
RBC: 2.97 MIL/uL — ABNORMAL LOW (ref 3.87–5.11)
RDW: 16.3 % — ABNORMAL HIGH (ref 11.5–15.5)
WBC: 11.4 10*3/uL — ABNORMAL HIGH (ref 4.0–10.5)

## 2018-05-28 LAB — BASIC METABOLIC PANEL
Anion gap: 5 (ref 5–15)
BUN: 24 mg/dL — AB (ref 8–23)
CO2: 33 mmol/L — ABNORMAL HIGH (ref 22–32)
CREATININE: 0.4 mg/dL — AB (ref 0.44–1.00)
Calcium: 8.1 mg/dL — ABNORMAL LOW (ref 8.9–10.3)
Chloride: 103 mmol/L (ref 98–111)
GFR calc Af Amer: >60 mL/min (ref >60)
GLUCOSE: 100 mg/dL — AB (ref 70–99)
POTASSIUM: 3 mmol/L — AB (ref 3.5–5.1)
SODIUM: 141 mmol/L (ref 135–145)

## 2018-05-28 LAB — CULTURE, BLOOD (ROUTINE X 2)
CULTURE: NO GROWTH
CULTURE: NO GROWTH
SPECIAL REQUESTS: ADEQUATE

## 2018-05-28 LAB — GLUCOSE, CAPILLARY
GLUCOSE-CAPILLARY: 97 mg/dL (ref 70–99)
Glucose-Capillary: 100 mg/dL — ABNORMAL HIGH (ref 70–99)
Glucose-Capillary: 104 mg/dL — ABNORMAL HIGH (ref 70–99)
Glucose-Capillary: 105 mg/dL — ABNORMAL HIGH (ref 70–99)
Glucose-Capillary: 108 mg/dL — ABNORMAL HIGH (ref 70–99)
Glucose-Capillary: 130 mg/dL — ABNORMAL HIGH (ref 70–99)

## 2018-05-28 LAB — MAGNESIUM: MAGNESIUM: 1.8 mg/dL (ref 1.7–2.4)

## 2018-05-28 LAB — PHOSPHORUS: Phosphorus: 2.6 mg/dL (ref 2.5–4.6)

## 2018-05-28 LAB — PROCALCITONIN: Procalcitonin: 2.22 ng/mL

## 2018-05-28 MED ORDER — FENTANYL CITRATE (PF) 2500 MCG/50ML IJ SOLN
0.0000 ug/h | Status: DC
  Administered 2018-05-28: 50 ug/h via INTRAVENOUS
  Administered 2018-05-29: 250 ug/h via INTRAVENOUS
  Administered 2018-05-30: 350 ug/h via INTRAVENOUS
  Administered 2018-05-30: 225 ug/h via INTRAVENOUS
  Administered 2018-05-31: 350 ug/h via INTRAVENOUS
  Administered 2018-05-31 – 2018-06-02 (×4): 400 ug/h via INTRAVENOUS
  Filled 2018-05-28 (×9): qty 100

## 2018-05-28 MED ORDER — QUETIAPINE FUMARATE 50 MG PO TABS
50.0000 mg | ORAL_TABLET | Freq: Two times a day (BID) | ORAL | Status: DC
  Administered 2018-05-28 – 2018-06-01 (×9): 50 mg
  Filled 2018-05-28 (×9): qty 1

## 2018-05-28 MED ORDER — FUROSEMIDE 10 MG/ML IJ SOLN
40.0000 mg | Freq: Two times a day (BID) | INTRAMUSCULAR | Status: DC
  Administered 2018-05-28 – 2018-05-30 (×6): 40 mg via INTRAVENOUS
  Filled 2018-05-28 (×6): qty 4

## 2018-05-28 MED ORDER — POTASSIUM CHLORIDE 20 MEQ/15ML (10%) PO SOLN
40.0000 meq | ORAL | Status: AC
  Administered 2018-05-28 (×3): 40 meq
  Filled 2018-05-28 (×3): qty 30

## 2018-05-28 NOTE — Progress Notes (Signed)
Nutrition Follow-up  DOCUMENTATION CODES:   Not applicable  INTERVENTION:   Increase TF via OGT with Vital AF 1.2 at goal rate of 60 ml/h (1440 ml per day) to provide 1728 kcals, 108 gm protein, 1168 ml free water daily.  NUTRITION DIAGNOSIS:   Inadequate oral intake related to inability to eat as evidenced by NPO status.  Ongoing  GOAL:   Patient will meet greater than or equal to 90% of their needs  Progressing  MONITOR:   Labs, Weight trends, Vent status, I & O's, TF tolerance  REASON FOR ASSESSMENT:   Ventilator    ASSESSMENT:   67 y/o female PMHx anxiety, depression, etoh use. Presents after husband found pt on floor, covered in emesis w/ empty bottles of klonopin and zanaflex. Questionable cardiac arrest. Intubated on arrival to ED. Pt developed septic shock w/ CXR showing aspiration PNA.   Patient remains intubated on ventilator support MV: 14.2 L/min Temp (24hrs), Avg:100.2 F (37.9 C), Min:98.2 F (36.8 C), Max:101.8 F (38.8 C)  Reviewed I/O's: -792 ml x 24 hours and +13 L since admission  Case discussed with RN, who reports pt is tolerating TF well. Vital AF 1.2 @ 55 ml/hr via OGT providing 1584 kcals, 99 grams protein, and 1071 ml free water daily (meeting 87% of estimated kcal needs and 100% of estimated protein needs)  Per MD notes, Chest x-ray personally reviewed shows mildly improved aeration of the right and persistent left-sided infiltrate. She will need a psych evaluation once medically improved.   Pt husband present at bedside. PTA pt had a good appetite. He describes pt as a very health-conscious person- she ate very healthfully (lots of fruits, vegetables, and yogurt, but has a weakness for ice cream and sweets). Pt is very active and goes to the gym or cross fit class 6 days per week. Pt husband denies that pt has lost weight.   Discussed with pt husband how pt is currently receiving nutrition.   Labs reviewed: K: 3.0, CBGS: 94-130.    NUTRITION - FOCUSED PHYSICAL EXAM:    Most Recent Value  Orbital Region  No depletion  Upper Arm Region  No depletion  Thoracic and Lumbar Region  Mild depletion  Buccal Region  No depletion  Temple Region  No depletion  Clavicle Bone Region  Mild depletion  Clavicle and Acromion Bone Region  No depletion  Scapular Bone Region  Unable to assess  Dorsal Hand  Unable to assess  Patellar Region  No depletion  Anterior Thigh Region  Unable to assess  Posterior Calf Region  No depletion       Diet Order:   Diet Order            Diet NPO time specified  Diet effective now              EDUCATION NEEDS:   No education needs have been identified at this time  Skin:  Skin Assessment: Reviewed RN Assessment  Last BM:  05/28/18  Height:   Ht Readings from Last 1 Encounters:  05/23/18 5\' 2"  (1.575 m)    Weight:   Wt Readings from Last 1 Encounters:  05/28/18 64.8 kg    Ideal Body Weight:  50 kg  BMI:  Body mass index is 26.13 kg/m.  Estimated Nutritional Needs:   Kcal:  1813  Protein:  95-110 grams  Fluid:  per MD    Millie Forde A. Mayford Knife, RD, LDN, CDE Pager: [phone] After hours Pager: [phone]

## 2018-05-28 NOTE — Progress Notes (Signed)
67 year old admitted 10/12 after being found unresponsive, presumed overdose with alcohol and benzodiazepines with severe aspiration pneumonia and ARDS  She remains critically ill and deeply sedated, paralysis was discontinued 10/16 and she seems to be tolerating.  Oxygenation is still poor, requiring FiO2 50% PEEP of 12, peak pressures 30 range. Decreased breath sounds bilateral, 1+ edema, soft nontender abdomen, S1-S2 distant.  Chest x-ray personally reviewed shows mildly improved aeration of the right and persistent left-sided infiltrate.  Labs show hypokalemia, no leukocytosis.  Impression/plan  Severe ARDS-keep PEEP at 12, lower FiO2 to 40% if saturations permit We will try to avoid proning here unless oxygenation worsens. Continue diuresis with Lasix for negative balance. Maintain deep sedation goal RA SS -5.  Septic shock resolved Aspiration pneumonia-cultures negative, continue ceftriaxone, persistent fevers but no leukocytosis, low threshold to broaden antibiotic.  Hypokalemia will be repleted.  Husband updated at bedside to expect prolonged course and guarded prognosis given.  She will need psychiatric evaluation eventually once medically improved.  My critical care time x 60m  Cyril Mourning MD. FCCP. [place] [facility] Pager [phone] If no response call 319 [phone]   05/28/2018

## 2018-05-28 NOTE — Progress Notes (Addendum)
NAME:  Courtney Beard, MRN:  [id number], DOB:  1951-07-08, LOS: 5 ADMISSION DATE:  05/23/2018, CONSULTATION DATE:  10/12 REFERRING MD:  Dr. Blinda Leatherwood, CHIEF COMPLAINT:  Overdose    History Present Illness   67 year old female presents s/p cardiac arrest on 10/12 following aspiration event found unresponsive, covered in emesis, empty bottle of Klonopin and Zanaflex.  Was pulseless, CPR initiated by husband, had blood pressure on EMS arrival  Past Medical History  Anxiety, Depression, ETOH   Significant Hospital Events   10-12: Presents to ED, mechanically ventilated, central access placed, placed on vasoactive drips, antibiotics initiated 10/13: Placed on neuromuscular blockade given escalating PEEP/FiO2 requirements.  Norepinephrine needs worsened.  Placed on bicarbonate infusion 10/14: Still on high FiO2 and PEEP.  Left a little better when reviewing chest x-ray but right side still with dense consolidation.  Still on high pressor requirements.  Hyperchloremia and acidosis improved.  Stopping bicarbonate infusion.  Adding hypertonic saline neb as well as MetaNeb 10/15: She de-recruited during MetaNeb attempts so this was discontinued.  Shock resolved, BP elevated endotracheal tube at carina required repositioning chest x-ray worse requiring increased FiO2 10/16>> continue attempts at diuresis.  Tolerating weaning peep/fiO2. neuromuscular blockade discontinued 10/17: Tolerating move from neuromuscular blockade.  Still on 12 of PEEP but room to diuresis, Lasix escalated to every 12.  Having low-grade fever, sputum culture sent.  Consults: date of consult/date signed off & final recs:  PCCM  Procedures (surgical and bedside):  ETT 10/12 >> CVC 10/12 >>   Significant Diagnostic Tests:  CXR 10/12 > 1. Endotracheal tube tip in the proximal right mainstem bronchus. Retraction by 4 cm recommended. 2. Nasogastric tube side port below the diaphragm but not clearly visualized. Dedicated  abdominal radiograph may be helpful. 3. Large area of right parahilar and medial right lung apex consolidation which may indicate infection or neoplastic process. Chest CT or Followup PA and lateral chest X-ray is recommended in 3-4 weeks following trial of antibiotic therapy to ensure resolution and exclude underlying malignancy. CT Head/C-Spine 10/12 > 1. No acute intracranial abnormality.2. No acute fracture of the cervical spine. 3. Large area of right upper lobe consolidation, possibly secondary to aspiration. TTE 11/12 >> EF 60 to 65%, normal RV, estimated PASP 44 mmHg  Micro Data:  Blood 10/12 >> U/A 10/12 >> negative Resp 10/12>>Normal flora Urine culture 10/17 >>> Antimicrobials:  Zosyn 10/12 >> 10/15 Rocephin 10/15>>> Vanco 10/13 >> 10/14  Subjective/interval hx:  Appears comfortable Objective   Blood pressure 131/62, pulse (Abnormal) 127, temperature (Abnormal) 101.7 F (38.7 C), resp. rate (Abnormal) 37, height 5\' 2"  (1.575 m), weight 64.8 kg, SpO2 92 %. CVP:  [7 mmHg-17 mmHg] 17 mmHg  Vent Mode: PRVC FiO2 (%):  [40 %-70 %] 50 % Set Rate:  [35 bmp] 35 bmp Vt Set:  [40 mL-400 mL] 400 mL PEEP:  [10 cmH20-12 cmH20] 12 cmH20 Plateau Pressure:  [23 cmH20-30 cmH20] 27 cmH20   Intake/Output Summary (Last 24 hours) at 05/28/2018 1040 Last data filed at 05/28/2018 1000 Gross per 24 hour  Intake 3161.7 ml  Output 3095 ml  Net 66.7 ml   Filed Weights   05/26/18 0500 05/27/18 0449 05/28/18 0500  Weight: 69.1 kg 67.2 kg 64.8 kg    Examination: General: 67 year old female currently sedated on ventilator HEENT normocephalic a traumatic she does have scleral edema Pulmonary: Coarse scattered rhonchi right greater than left Cardiac: Tachycardic regular rate and rhythm Abdomen:, Nontender.  Positive bowel sounds GU: Clear  yellow Extremities: Brisk capillary refill diffuse anasarca warm to touch strong pulses Neuro: Sedated however responds to stimulus, not following  commands, does attempt to localize  Resolved Hospital Problem list   Lactic acidosis Hyperkalemia Non-anion gap metabolic acidosis Septic shock resolved 10/15 Relative adrenal deficiency Leukopenia  Assessment & Plan:   Acute Hypoxic Respiratory Distress in setting of toxic ingestion, aspiration PNA Portable chest x-ray personally reviewed: Shows some improvement particularly on the left but also right base persistent right greater than left airspace disease support tubes and lines are in satisfactory position  Plan  Continue full ventilator support  PAD protocol  Continue to wean PEEP/FiO2  Aggressive diuresis  Antibiotic day #6 of 8 had been placed on Rocephin on the 15th; see below    Fever with mild leukocytosis Plan Sputum culture Procalcitonin algorithm We will hold off from broadening antibiotics as clinically I think she is improving  Questionable Cardiac Arrest (PEA) due to Overdose  Prolonged QTC -Husband was unable to palpate pulse, when EMS arrived patient was hypotension with pulse  -Echocardiogram reassuring -shock resolved  Plan  Telemetry   At risk for fluid and electrolyte imbalance: Hypokalemia -non-Anion gap metabolic acidosis is resolved Plan Continuing aggressive diuresis Electrolyte replacement as indicated  Acute toxic metabolic encephalopathy following overdose, benzo and Zanaflex plus alcohol H/O Anxiety/Depression, ETOH  -Not clear if this was intentional or not Plan  Continuing PAD protocol RASS goal -2 Will need psychiatric evaluation following extubation   Disposition / Summary of Today's Plan 05/28/18   Vent needs improving slowly.  Much better than yesterday.  Remains off pressors.  Sputum culture negative.  Will repeat diuresis.  Consider trial off NMB.     Diet: NPO now on full dose tube feeding Pain/Anxiety/Delirium protocol : Fentanyl drip, Versed drip RASS goal -2 VAP protocol: In place DVT prophylaxis: Heparin  GI  prophylaxis: PPI Hyperglycemia protocol Mobility: Bedrest  Code Status: Full Code  Family Communication: Son and husband updated at bedside 10/ 17  Labs   CBC: Recent Labs  Lab 05/23/18 0555 05/24/18 0238 05/25/18 0410 05/26/18 0445 05/27/18 0450 05/28/18 0426  WBC 2.8* 2.4* 6.8 7.3 9.1 11.4*  NEUTROABS 2.3  --   --   --   --   --   HGB 9.7* 10.1* 8.4* 8.4* 8.0* 7.4*  HCT 30.9* 33.2* 26.1* 27.2* 25.2* 21.9*  MCV 82.4 84.1 80.8 81.4 78.5* 73.7*  PLT 245 243 208 197 184 183    Basic Metabolic Panel: Recent Labs  Lab 05/24/18 0238 05/24/18 1743 05/25/18 0410 05/26/18 0445 05/27/18 0450 05/27/18 0828 05/28/18 0426  NA 139 136 138 140 142  --  141  K 5.1 4.9 4.1 3.8 3.8  --  3.0*  CL 109 105 104 103 102  --  103  CO2 26 26 28 29  33*  --  33*  GLUCOSE 145* 185* 164* 114* 111*  --  100*  BUN 13 15 14 20 23   --  24*  CREATININE 0.79 0.69 0.76 0.59 0.62  --  0.40*  CALCIUM 7.1* 6.7* 6.9* 8.2* 8.6*  --  8.1*  MG 2.1  --  2.0 1.9 1.6* 2.1 1.8  PHOS 3.8  --  2.0* 2.3* 1.9*  --  2.6   GFR: Estimated Creatinine Clearance: 62 mL/min (A) (by C-G formula based on SCr of 0.4 mg/dL (L)). Recent Labs  Lab 05/23/18 0502  05/23/18 0555 05/23/18 1052 05/24/18 0238 05/25/18 0410 05/26/18 0445 05/27/18 0450 05/28/18 0426  PROCALCITON  --   --  <  0.10  --  21.72 12.24  --   --   --   WBC  --    < > 2.8*  --  2.4* 6.8 7.3 9.1 11.4*  LATICACIDVEN 3.21*  --  2.5* 1.4  --   --   --   --   --    < > = values in this interval not displayed.    Liver Function Tests: Recent Labs  Lab 05/23/18 0555 05/27/18 0450  AST 28 31  ALT 17 19  ALKPHOS 50 80  BILITOT 0.4 0.3  PROT 4.5* 4.8*  ALBUMIN 2.5* 1.6*   No results for input(s): LIPASE, AMYLASE in the last 168 hours. Recent Labs  Lab 05/23/18 0555  AMMONIA 36*    ABG    Component Value Date/Time   PHART 7.432 05/27/2018 0427   PCO2ART 49.7 (H) 05/27/2018 0427   PO2ART 86.0 05/27/2018 0427   HCO3 33.1 (H) 05/27/2018  0427   TCO2 35 (H) 05/27/2018 0427   ACIDBASEDEF 1.0 05/24/2018 1130   O2SAT 97.0 05/27/2018 0427     Coagulation Profile: Recent Labs  Lab 05/23/18 0555  INR 1.06    Cardiac Enzymes: Recent Labs  Lab 05/23/18 0555 05/23/18 1052 05/23/18 1807  CKTOTAL  --  508*  --   TROPONINI <0.03 <0.03 0.03*    HbA1C: No results found for: HGBA1C  CBG: Recent Labs  Lab 05/27/18 1516 05/27/18 1935 05/27/18 2353 05/28/18 0338 05/28/18 0753  GLUCAP 89 92 94 100* 130*   Critical care x35 minutes  Simonne Martinet ACNP-BC [facility] Pulmonary/Critical Care Pager # [phone] OR # [phone] if no answer

## 2018-05-29 ENCOUNTER — Inpatient Hospital Stay (HOSPITAL_COMMUNITY): Payer: Medicare HMO

## 2018-05-29 LAB — POCT I-STAT 3, ART BLOOD GAS (G3+)
ACID-BASE EXCESS: 11 mmol/L — AB (ref 0.0–2.0)
BICARBONATE: 35.5 mmol/L — AB (ref 20.0–28.0)
O2 Saturation: 92 %
PCO2 ART: 50.8 mmHg — AB (ref 32.0–48.0)
PO2 ART: 65 mmHg — AB (ref 83.0–108.0)
Patient temperature: 38.1
TCO2: 37 mmol/L — AB (ref 22–32)
pH, Arterial: 7.456 — ABNORMAL HIGH (ref 7.350–7.450)

## 2018-05-29 LAB — COMPREHENSIVE METABOLIC PANEL
ALBUMIN: 1.5 g/dL — AB (ref 3.5–5.0)
ALK PHOS: 105 U/L (ref 38–126)
ALT: 23 U/L (ref 0–44)
AST: 28 U/L (ref 15–41)
Anion gap: 9 (ref 5–15)
BILIRUBIN TOTAL: 0.5 mg/dL (ref 0.3–1.2)
BUN: 25 mg/dL — ABNORMAL HIGH (ref 8–23)
CALCIUM: 8.6 mg/dL — AB (ref 8.9–10.3)
CO2: 33 mmol/L — AB (ref 22–32)
CREATININE: 0.67 mg/dL (ref 0.44–1.00)
Chloride: 100 mmol/L (ref 98–111)
GFR calc non Af Amer: >60 mL/min (ref >60)
GLUCOSE: 104 mg/dL — AB (ref 70–99)
Potassium: 3.8 mmol/L (ref 3.5–5.1)
Sodium: 142 mmol/L (ref 135–145)
TOTAL PROTEIN: 4.8 g/dL — AB (ref 6.5–8.1)

## 2018-05-29 LAB — GLUCOSE, CAPILLARY
GLUCOSE-CAPILLARY: 111 mg/dL — AB (ref 70–99)
Glucose-Capillary: 108 mg/dL — ABNORMAL HIGH (ref 70–99)
Glucose-Capillary: 103 mg/dL — ABNORMAL HIGH (ref 70–99)
Glucose-Capillary: 98 mg/dL (ref 70–99)
Glucose-Capillary: 102 mg/dL — ABNORMAL HIGH (ref 70–99)

## 2018-05-29 LAB — PHOSPHORUS: Phosphorus: 4.4 mg/dL (ref 2.5–4.6)

## 2018-05-29 LAB — CBC
HCT: 24.8 % — ABNORMAL LOW (ref 36.0–46.0)
Hemoglobin: 8.2 g/dL — ABNORMAL LOW (ref 12.0–15.0)
MCH: 24.7 pg — AB (ref 26.0–34.0)
MCHC: 33.1 g/dL (ref 30.0–36.0)
MCV: 74.7 fL — AB (ref 80.0–100.0)
PLATELETS: 247 10*3/uL (ref 150–400)
RBC: 3.32 MIL/uL — ABNORMAL LOW (ref 3.87–5.11)
RDW: 16.8 % — AB (ref 11.5–15.5)
WBC: 13.9 10*3/uL — ABNORMAL HIGH (ref 4.0–10.5)
nRBC: 0.6 % — ABNORMAL HIGH (ref 0.0–0.2)

## 2018-05-29 LAB — MAGNESIUM: MAGNESIUM: 2 mg/dL (ref 1.7–2.4)

## 2018-05-29 MED ORDER — POTASSIUM CHLORIDE 20 MEQ/15ML (10%) PO SOLN
40.0000 meq | ORAL | Status: AC
  Administered 2018-05-29 (×3): 40 meq
  Filled 2018-05-29 (×3): qty 30

## 2018-05-29 NOTE — Progress Notes (Signed)
240 cc fentanyl IV wasted; witnessed by Darlis Loan.

## 2018-05-29 NOTE — Progress Notes (Signed)
67 year old admitted 10/12 after being found unresponsive, presumed overdose with alcohol and benzodiazepines with severe aspiration pneumonia and ARDS  Paralytic was discontinued 10/16, she remains deeply sedated critically ill with severe ARDS with Versed and fentanyl drip. Oxygenation is marginally improved now down to 50%/PEEP of 10, peak pressures in the high 20s.  Exam decreased breath sounds bilateral, soft nontender abdomen, S1-S2 tacky, 1+ edema.  Chest x-ray personally reviewed by me shows dense right lower lobe infiltrate and slight improvement of aeration in the left side.  Labs show improved ventilation and ABG with compensated hypercarbia, normal electrolytes, mild leukocytosis and decreasing procalcitonin.  Impression/plan  Severe ARDS-ventilator settings were reviewed and adjusted, continue lung protective ventilation, lower PEEP to 10, does not need patrolling at present. Continue aggressive diuresis with Lasix to goal CVP 4-8 range. Maintain deep sedation until PEEP lowered further with Versed and fentanyl.  Aspiration pneumonia-continue ceftriaxone, cultures negative so far. Septic shock resolved  Intentional overdose of benzodiazepines-will need psychiatric evaluation eventually.  Husband updated at bedside and guarded prognosis given that she has made improvement over the last few days so I remain optimistic for a full recovery.  My critical care time x 9m  Cyril Mourning MD. FCCP. [place] [facility] Pager [phone] If no response call 319 [phone]   05/29/2018

## 2018-05-29 NOTE — Progress Notes (Addendum)
NAME:  Courtney Beard, MRN:  [id number], DOB:  04/25/1951, LOS: 6 ADMISSION DATE:  05/23/2018, CONSULTATION DATE:  10/12 REFERRING MD:  Dr. Blinda Leatherwood, CHIEF COMPLAINT:  Overdose    History Present Illness   67 year old female presents s/p cardiac arrest on 10/12 following aspiration event found unresponsive, covered in emesis, empty bottle of Klonopin and Zanaflex.  Was pulseless, CPR initiated by husband, had blood pressure on EMS arrival  Past Medical History  Anxiety, Depression, ETOH   Significant Hospital Events   10-12: Presents to ED, mechanically ventilated, central access placed, placed on vasoactive drips, antibiotics initiated 10/13: Placed on neuromuscular blockade given escalating PEEP/FiO2 requirements.  Norepinephrine needs worsened.  Placed on bicarbonate infusion 10/14: Still on high FiO2 and PEEP.  Left a little better when reviewing chest x-ray but right side still with dense consolidation.  Still on high pressor requirements.  Hyperchloremia and acidosis improved.  Stopping bicarbonate infusion.  Adding hypertonic saline neb as well as MetaNeb 10/15: She de-recruited during MetaNeb attempts so this was discontinued.  Shock resolved, BP elevated endotracheal tube at [place]ina required repositioning chest x-ray worse requiring increased FiO2 10/16>> continue attempts at diuresis.  Tolerating weaning peep/fiO2. neuromuscular blockade discontinued 10/17: Tolerating move from neuromuscular blockade.  Still on 12 of PEEP but room to diuresis, Lasix escalated to every 12.  Having low-grade fever, sputum culture sent. She 18: Ongoing diuresis, chest x-ray and PEEP/FiO2 needs continue to improve.  Continuing diuresis, awaiting sputum culture however procalcitonin continues to improve so continuing ceftriaxone for now  Consults: date of consult/date signed off & final recs:  PCCM  Procedures (surgical and bedside):  ETT 10/12 >> CVC 10/12 >>   Significant Diagnostic Tests:    CXR 10/12 > 1. Endotracheal tube tip in the proximal right mainstem bronchus. Retraction by 4 cm recommended. 2. Nasogastric tube side port below the diaphragm but not clearly visualized. Dedicated abdominal radiograph may be helpful. 3. Large area of right parahilar and medial right lung apex consolidation which may indicate infection or neoplastic process. Chest CT or Followup PA and lateral chest X-ray is recommended in 3-4 weeks following trial of antibiotic therapy to ensure resolution and exclude underlying malignancy. CT Head/C-Spine 10/12 > 1. No acute intracranial abnormality.2. No acute fracture of the cervical spine. 3. Large area of right upper lobe consolidation, possibly secondary to aspiration. TTE 11/12 >> EF 60 to 65%, normal RV, estimated PASP 44 mmHg  Micro Data:  Blood 10/12 >> U/A 10/12 >> negative Resp 10/12>>Normal flora Urine culture 10/17 >>> negative Sputum culture 10/17:>>> Antimicrobials:  Zosyn 10/12 >> 10/15 Rocephin 10/15>>> Vanco 10/13 >> 10/14  Subjective/interval hx:  Appears comfortable Objective   Blood pressure 117/66, pulse (Abnormal) 112, temperature 99.7 F (37.6 C), resp. rate (Abnormal) 35, height 5\' 2"  (1.575 m), weight 63.8 kg, SpO2 93 %. CVP:  [10 mmHg-17 mmHg] 12 mmHg  Vent Mode: PRVC FiO2 (%):  [40 %-50 %] 40 % Set Rate:  [35 bmp] 35 bmp Vt Set:  [400 mL] 400 mL PEEP:  [10 cmH20-12 cmH20] 10 cmH20 Plateau Pressure:  [21 cmH20-31 cmH20] 26 cmH20   Intake/Output Summary (Last 24 hours) at 05/29/2018 0955 Last data filed at 05/29/2018 0900 Gross per 24 hour  Intake 2052.71 ml  Output 4835 ml  Net -2782.29 ml   Filed Weights   05/27/18 0449 05/28/18 0500 05/29/18 0449  Weight: 67.2 kg 64.8 kg 63.8 kg    Examination: General: 67 year old female currently sedated on mechanical  ventilation HEENT normocephalic atraumatic no jugular venous distention orally intubated Pulmonary: Coarse scattered rhonchi right greater than  left Cardiac: Regular rate and rhythm Abdomen: Soft nontender Extremities: Diffuse anasarca but this is improving daily warm, dry, capillary refill GU: Clear yellow urine Neuro: Agitated at times but moves all extremities no focal deficits  Resolved Hospital Problem list   Lactic acidosis Hyperkalemia Non-anion gap metabolic acidosis Septic shock resolved 10/15 Relative adrenal deficiency Leukopenia  Assessment & Plan:   Acute Hypoxic Respiratory Distress in setting of toxic ingestion, aspiration PNA Portable chest x-ray personally reviewed: This shows support tubes and lines in satisfactory position, bilateral airspace disease right greater than left, she continues to demonstrate interval improvement in aeration -Down to 10 Plan  Continuing full ventilator support weaning PEEP and FiO2 for saturations greater than 88% Continue Lasix again today Day #7 of 8 antibiotics, Rocephin was started on the 15th, we are awaiting sputum culture given worsening leukocytosis and isolated fever however currently this remains pending Follow-up ABG and chest x-ray in a.m. PAD protocol RASS goal -2 Hopefully initiate weaning in the next 48 hours or so   Fever with mild leukocytosis -Procalcitonin continues to improve on current antimicrobial regimen Plan Follow-up sputum culture Hold off on widening antibiotics  Questionable Cardiac Arrest (PEA) due to Overdose  Prolonged QTC -Husband was unable to palpate pulse, when EMS arrived patient was hypotension with pulse  -Echocardiogram reassuring -shock resolved  Plan  Telemetry monitoring   At risk for fluid and electrolyte imbalance Plan Continuing aggressive diuresis Electrolyte replacement as indicated  Acute toxic metabolic encephalopathy following overdose, benzo and Zanaflex plus alcohol H/O Anxiety/Depression, ETOH  -Not clear if this was intentional or not Plan  Continue PAD protocol RASS goal -2 Once we have PEEP down to 8,  we will transition off Versed, and attempt to use Precedex with PRN fentanyl Will need psychiatry evaluation pending extubation   Disposition / Summary of Today's Plan 05/29/18   She continues to improve daily.  X-ray improved, aeration acceptable, will continue aggressive diuresis and wean PEEP/FiO2 for saturations greater than 88%    Diet: NPO now on full dose tube feeding Pain/Anxiety/Delirium protocol : Fentanyl drip, Versed drip RASS goal -2 VAP protocol: In place DVT prophylaxis: Heparin  GI prophylaxis: PPI Hyperglycemia protocol Mobility: Bedrest  Code Status: Full Code  Family Communication: Son and husband updated at bedside 10/ 17  Labs   CBC: Recent Labs  Lab 05/23/18 0555  05/25/18 0410 05/26/18 0445 05/27/18 0450 05/28/18 0426 05/29/18 0502  WBC 2.8*   < > 6.8 7.3 9.1 11.4* 13.9*  NEUTROABS 2.3  --   --   --   --   --   --   HGB 9.7*   < > 8.4* 8.4* 8.0* 7.4* 8.2*  HCT 30.9*   < > 26.1* 27.2* 25.2* 21.9* 24.8*  MCV 82.4   < > 80.8 81.4 78.5* 73.7* 74.7*  PLT 245   < > 208 197 184 183 247   < > = values in this interval not displayed.    Basic Metabolic Panel: Recent Labs  Lab 05/25/18 0410 05/26/18 0445 05/27/18 0450 05/27/18 0828 05/28/18 0426 05/29/18 0502  NA 138 140 142  --  141 142  K 4.1 3.8 3.8  --  3.0* 3.8  CL 104 103 102  --  103 100  CO2 28 29 33*  --  33* 33*  GLUCOSE 164* 114* 111*  --  100* 104*  BUN  14 20 23   --  24* 25*  CREATININE 0.76 0.59 0.62  --  0.40* 0.67  CALCIUM 6.9* 8.2* 8.6*  --  8.1* 8.6*  MG 2.0 1.9 1.6* 2.1 1.8 2.0  PHOS 2.0* 2.3* 1.9*  --  2.6 4.4   GFR: Estimated Creatinine Clearance: 61.5 mL/min (by C-G formula based on SCr of 0.67 mg/dL). Recent Labs  Lab 05/23/18 0502  05/23/18 0555 05/23/18 1052 05/24/18 0238 05/25/18 0410 05/26/18 0445 05/27/18 0450 05/28/18 0426 05/28/18 1300 05/29/18 0502  PROCALCITON  --   --  <0.10  --  21.72 12.24  --   --   --  2.22  --   WBC  --    < > 2.8*  --  2.4* 6.8  7.3 9.1 11.4*  --  13.9*  LATICACIDVEN 3.21*  --  2.5* 1.4  --   --   --   --   --   --   --    < > = values in this interval not displayed.    Liver Function Tests: Recent Labs  Lab 05/23/18 0555 05/27/18 0450 05/29/18 0502  AST 28 31 28   ALT 17 19 23   ALKPHOS 50 80 105  BILITOT 0.4 0.3 0.5  PROT 4.5* 4.8* 4.8*  ALBUMIN 2.5* 1.6* 1.5*   No results for input(s): LIPASE, AMYLASE in the last 168 hours. Recent Labs  Lab 05/23/18 0555  AMMONIA 36*    ABG    Component Value Date/Time   PHART 7.456 (H) 05/29/2018 0412   PCO2ART 50.8 (H) 05/29/2018 0412   PO2ART 65.0 (L) 05/29/2018 0412   HCO3 35.5 (H) 05/29/2018 0412   TCO2 37 (H) 05/29/2018 0412   ACIDBASEDEF 1.0 05/24/2018 1130   O2SAT 92.0 05/29/2018 0412     Coagulation Profile: Recent Labs  Lab 05/23/18 0555  INR 1.06    Cardiac Enzymes: Recent Labs  Lab 05/23/18 0555 05/23/18 1052 05/23/18 1807  CKTOTAL  --  508*  --   TROPONINI <0.03 <0.03 0.03*    HbA1C: No results found for: HGBA1C  CBG: Recent Labs  Lab 05/28/18 1541 05/28/18 2000 05/28/18 2341 05/29/18 0326 05/29/18 0815  GLUCAP 108* 105* 104* 108* 103*   Critical care x33 minutes  Simonne Martinet ACNP-BC [facility] Pulmonary/Critical Care Pager # [phone] OR # [phone] if no answer

## 2018-05-30 ENCOUNTER — Inpatient Hospital Stay (HOSPITAL_COMMUNITY): Payer: Medicare HMO

## 2018-05-30 LAB — GLUCOSE, CAPILLARY
GLUCOSE-CAPILLARY: 106 mg/dL — AB (ref 70–99)
GLUCOSE-CAPILLARY: 112 mg/dL — AB (ref 70–99)
GLUCOSE-CAPILLARY: 112 mg/dL — AB (ref 70–99)
Glucose-Capillary: 107 mg/dL — ABNORMAL HIGH (ref 70–99)
Glucose-Capillary: 112 mg/dL — ABNORMAL HIGH (ref 70–99)
Glucose-Capillary: 97 mg/dL (ref 70–99)

## 2018-05-30 LAB — BASIC METABOLIC PANEL
ANION GAP: 7 (ref 5–15)
ANION GAP: 9 (ref 5–15)
BUN: 32 mg/dL — ABNORMAL HIGH (ref 8–23)
BUN: 32 mg/dL — AB (ref 8–23)
CO2: 35 mmol/L — ABNORMAL HIGH (ref 22–32)
CO2: 34 mmol/L — ABNORMAL HIGH (ref 22–32)
Calcium: 8.5 mg/dL — ABNORMAL LOW (ref 8.9–10.3)
Calcium: 8.6 mg/dL — ABNORMAL LOW (ref 8.9–10.3)
Chloride: 99 mmol/L (ref 98–111)
Chloride: 99 mmol/L (ref 98–111)
Creatinine, Ser: 0.68 mg/dL (ref 0.44–1.00)
Creatinine, Ser: 0.69 mg/dL (ref 0.44–1.00)
GFR calc Af Amer: >60 mL/min (ref >60)
Glucose, Bld: 103 mg/dL — ABNORMAL HIGH (ref 70–99)
Glucose, Bld: 120 mg/dL — ABNORMAL HIGH (ref 70–99)
POTASSIUM: 6.2 mmol/L — AB (ref 3.5–5.1)
POTASSIUM: 4.5 mmol/L (ref 3.5–5.1)
SODIUM: 142 mmol/L (ref 135–145)
Sodium: 141 mmol/L (ref 135–145)

## 2018-05-30 LAB — CBC
HEMATOCRIT: 24.1 % — AB (ref 36.0–46.0)
HEMOGLOBIN: 7.8 g/dL — AB (ref 12.0–15.0)
MCH: 24.1 pg — ABNORMAL LOW (ref 26.0–34.0)
MCHC: 32.4 g/dL (ref 30.0–36.0)
MCV: 74.6 fL — ABNORMAL LOW (ref 80.0–100.0)
Platelets: 299 10*3/uL (ref 150–400)
RBC: 3.23 MIL/uL — ABNORMAL LOW (ref 3.87–5.11)
RDW: 16.7 % — AB (ref 11.5–15.5)
WBC: 13.9 10*3/uL — AB (ref 4.0–10.5)
nRBC: 0 % (ref 0.0–0.2)

## 2018-05-30 LAB — POCT I-STAT 3, ART BLOOD GAS (G3+)
Acid-Base Excess: 11 mmol/L — ABNORMAL HIGH (ref 0.0–2.0)
Bicarbonate: 36.7 mmol/L — ABNORMAL HIGH (ref 20.0–28.0)
O2 Saturation: 87 %
PCO2 ART: 53.8 mmHg — AB (ref 32.0–48.0)
PH ART: 7.442 (ref 7.350–7.450)
PO2 ART: 53 mmHg — AB (ref 83.0–108.0)
Patient temperature: 98.6
TCO2: 38 mmol/L — AB (ref 22–32)

## 2018-05-30 LAB — PHOSPHORUS: Phosphorus: 5 mg/dL — ABNORMAL HIGH (ref 2.5–4.6)

## 2018-05-30 LAB — MAGNESIUM: Magnesium: 2.4 mg/dL (ref 1.7–2.4)

## 2018-05-30 MED ORDER — DEXMEDETOMIDINE HCL IN NACL 400 MCG/100ML IV SOLN
0.4000 ug/kg/h | INTRAVENOUS | Status: DC
  Administered 2018-05-30: 0.5 ug/kg/h via INTRAVENOUS
  Administered 2018-05-30: 0.4 ug/kg/h via INTRAVENOUS
  Administered 2018-05-31: 1.2 ug/kg/h via INTRAVENOUS
  Administered 2018-05-31: 0.8 ug/kg/h via INTRAVENOUS
  Filled 2018-05-30 (×4): qty 100

## 2018-05-30 NOTE — Progress Notes (Signed)
67 year old admitted 10/12 after being found unresponsive, presumed overdose with alcohol and benzodiazepines with severe aspiration pneumonia and ARDS  Paralytic was discontinued 10/16, but she remains critically ill under deep sedation with RA SS -3, on 50%/PEEP of 10 , anasarca, decreased breath sounds bilateral, S1-S2 tacky,, 1+ edema.  Labs reviewed which shows hyperkalemia 6.2, mild leukocytosis and anemia.  ABG shows compensated hypercarbia with mild hypoxia. Chest x-ray personally reviewed which shows better aeration in the right lower lobe with consolidation.  Impression/plan  ARDS -marginal improvement in the chest x-ray and oxygenation compared to yesterday although much improved compared to admission, keep PEEP at 10 and continue to lower FiO2 to 40% range. She may have reached limit of diuresis with Lasix since BUN is now rising.  Hyperkalemia-recheck and treat only if persistently high.  Aspiration pneumonia-continue ceftriaxone, septic shock is resolved, cultures negative so far.  Intentional overdose of benzodiazepines-eventually need psychiatry evaluation.  Husband updated at bedside on a daily basis  My critical care timex 32 minutes  Cyril Mourning MD. FCCP. [place] [facility] Pager [phone] If no response call [phone]   05/30/2018

## 2018-05-30 NOTE — Progress Notes (Signed)
Wasted 22cc of Versed in "stericycle" container with [employer]

## 2018-05-30 NOTE — Progress Notes (Signed)
NAME:  Courtney Beard, MRN:  [id number], DOB:  08-17-1950, LOS: 7 ADMISSION DATE:  05/23/2018, CONSULTATION DATE:  10/12 REFERRING MD:  Dr. Blinda Leatherwood, CHIEF COMPLAINT:  Overdose    History Present Illness   67 year old female presents s/p cardiac arrest on 10/12 following aspiration event found unresponsive, covered in emesis, empty bottle of Klonopin and Zanaflex.  Was pulseless, CPR initiated by husband, had blood pressure on EMS arrival  Past Medical History  Anxiety, Depression, ETOH   Significant Hospital Events   10-12: Presents to ED, mechanically ventilated, central access placed, placed on vasoactive drips, antibiotics initiated 10/13: Placed on neuromuscular blockade given escalating PEEP/FiO2 requirements.  Norepinephrine needs worsened.  Placed on bicarbonate infusion 10/14: Still on high FiO2 and PEEP.  Left a little better when reviewing chest x-ray but right side still with dense consolidation.  Still on high pressor requirements.  Hyperchloremia and acidosis improved.  Stopping bicarbonate infusion.  Adding hypertonic saline neb as well as MetaNeb 10/15: She de-recruited during MetaNeb attempts so this was discontinued.  Shock resolved, BP elevated endotracheal tube at [place] required repositioning chest x-ray worse requiring increased FiO2 10/16>> continue attempts at diuresis.  Tolerating weaning peep/fiO2. neuromuscular blockade discontinued 10/17: Tolerating move from neuromuscular blockade.  Still on 12 of PEEP but room to diuresis, Lasix escalated to every 12.  Having low-grade fever, sputum culture sent. 10/18: Ongoing diuresis, chest x-ray and PEEP/FiO2 needs continue to improve.  Continuing diuresis, awaiting sputum culture however procalcitonin continues to improve so continuing ceftriaxone for now 10/19: Continues to improve, chest x-ray improving.  Stopping Versed infusion and starting Precedex  Consults: date of consult/date signed off & final recs:   PCCM  Procedures (surgical and bedside):  ETT 10/12 >> CVC 10/12 >>   Significant Diagnostic Tests:  CXR 10/12 > 1. Endotracheal tube tip in the proximal right mainstem bronchus. Retraction by 4 cm recommended. 2. Nasogastric tube side port below the diaphragm but not clearly visualized. Dedicated abdominal radiograph may be helpful. 3. Large area of right parahilar and medial right lung apex consolidation which may indicate infection or neoplastic process. Chest CT or Followup PA and lateral chest X-ray is recommended in 3-4 weeks following trial of antibiotic therapy to ensure resolution and exclude underlying malignancy. CT Head/C-Spine 10/12 > 1. No acute intracranial abnormality.2. No acute fracture of the cervical spine. 3. Large area of right upper lobe consolidation, possibly secondary to aspiration. TTE 11/12 >> EF 60 to 65%, normal RV, estimated PASP 44 mmHg  Micro Data:  Blood 10/12 >> U/A 10/12 >> negative Resp 10/12>>Normal flora Urine culture 10/17 >>> negative Sputum culture 10/17:>>> Antimicrobials:  Zosyn 10/12 >> 10/15 Rocephin 10/15>>> Vanco 10/13 >> 10/14  Subjective/interval hx:  Appears comfortable  Objective   Blood pressure 117/61, pulse (Abnormal) 118, temperature (Abnormal) 100.8 F (38.2 C), resp. rate (Abnormal) 31, height 5\' 2"  (1.575 m), weight 62.1 kg, SpO2 92 %. CVP:  [3 mmHg-14 mmHg] 3 mmHg  Vent Mode: PRVC FiO2 (%):  [40 %-50 %] 50 % Set Rate:  [30 bmp] 30 bmp Vt Set:  [400 mL] 400 mL PEEP:  [10 cmH20] 10 cmH20 Plateau Pressure:  [22 cmH20-25 cmH20] 22 cmH20   Intake/Output Summary (Last 24 hours) at 05/30/2018 0945 Last data filed at 05/30/2018 0800 Gross per 24 hour  Intake 1759.6 ml  Output 5140 ml  Net -3380.4 ml   Filed Weights   05/28/18 0500 05/29/18 0449 05/30/18 0353  Weight: 64.8 kg 63.8 kg  62.1 kg    Examination:  General: Sedated on ventilator no acute distress HEENT normocephalic atraumatic decreasing scleral  edema orally intubated mucous membranes moist no JVD right IJ catheter unremarkable Pulmonary: Scattered rhonchi diminished on the right no accessory use FiO2 and PEEP requirements continue to improve Cardiac: Tachycardia no murmur rub or gallop Abdomen: Soft nontender is having liquid stools today GU: Clear yellow Extremities: Decreased swelling/edema Neuro: Sedated on ventilator  Resolved Hospital Problem list   Lactic acidosis Hyperkalemia Non-anion gap metabolic acidosis Septic shock resolved 10/15 Relative adrenal deficiency Leukopenia   Assessment & Plan:   Acute Hypoxic Respiratory Distress in setting of toxic ingestion, aspiration PNA Audible chest x-ray reviewed: Continued improvement in aeration, does have right greater than left airspace disease but serial chest x-rays continue to improve Plan  Continuing full ventilator support weaning PEEP and FiO2 as able  Repeating Lasix again today as long as BUN and creatinine tolerate  Day #8 antibiotics, changed to ceftriaxone on the 15th, will hold off on discontinuing as we have culture still pending  PAD protocol changing RASS: -1 to -2 VAP bundle We will repeat chest x-ray on the 21st   Fever with mild leukocytosis -Procalcitonin continues to improve on current antimicrobial regimen Plan Follow-up pending sputum culture  Questionable Cardiac Arrest (PEA) due to Overdose  Prolonged QTC -Husband was unable to palpate pulse, when EMS arrived patient was hypotension with pulse  -Echocardiogram reassuring -shock resolved  Plan  Telemetry monitoring   At risk for fluid and electrolyte imbalance: Hyperkalemia, suspect hemolyzed Plan Continue serial chemistries replace as indicated  Acute toxic metabolic encephalopathy following overdose, benzo and Zanaflex plus alcohol H/O Anxiety/Depression, ETOH  -Not clear if this was intentional or not Plan  Changing RASS goal to -1 to -2 Stopping Versed, adding Precedex,  ideally transition to PRN fentanyl with continuous Precedex Will need psychiatric evaluation post extubation    Disposition / Summary of Today's Plan 05/30/18   Continues to improve, continue diuresis, hope to start weaning in the next 24-48 hrs.    Diet: NPO now on full dose tube feeding Pain/Anxiety/Delirium protocol : Fentanyl drip, ranging Versed to Precedex on 10/19, RASS goal -1 VAP protocol: In place DVT prophylaxis: Heparin  GI prophylaxis: PPI Hyperglycemia protocol Mobility: Bedrest  Code Status: Full Code  Family Communication: Son and husband updated at bedside 10/ 17  Labs   CBC: Recent Labs  Lab 05/26/18 0445 05/27/18 0450 05/28/18 0426 05/29/18 0502 05/30/18 0359  WBC 7.3 9.1 11.4* 13.9* 13.9*  HGB 8.4* 8.0* 7.4* 8.2* 7.8*  HCT 27.2* 25.2* 21.9* 24.8* 24.1*  MCV 81.4 78.5* 73.7* 74.7* 74.6*  PLT 197 184 183 247 299    Basic Metabolic Panel: Recent Labs  Lab 05/26/18 0445 05/27/18 0450 05/27/18 0828 05/28/18 0426 05/29/18 0502 05/30/18 0515  NA 140 142  --  141 142 141  K 3.8 3.8  --  3.0* 3.8 6.2*  CL 103 102  --  103 100 99  CO2 29 33*  --  33* 33* 35*  GLUCOSE 114* 111*  --  100* 104* 103*  BUN 20 23  --  24* 25* 32*  CREATININE 0.59 0.62  --  0.40* 0.67 0.68  CALCIUM 8.2* 8.6*  --  8.1* 8.6* 8.5*  MG 1.9 1.6* 2.1 1.8 2.0 2.4  PHOS 2.3* 1.9*  --  2.6 4.4 5.0*   GFR: Estimated Creatinine Clearance: 60.8 mL/min (by C-G formula based on SCr of 0.68 mg/dL). Recent Labs  Lab 05/23/18 1052  05/24/18 0238 05/25/18 0410  05/27/18 0450 05/28/18 0426 05/28/18 1300 05/29/18 0502 05/30/18 0359  PROCALCITON  --   --  21.72 12.24  --   --   --  2.22  --   --   WBC  --    < > 2.4* 6.8   < > 9.1 11.4*  --  13.9* 13.9*  LATICACIDVEN 1.4  --   --   --   --   --   --   --   --   --    < > = values in this interval not displayed.    Liver Function Tests: Recent Labs  Lab 05/27/18 0450 05/29/18 0502  AST 31 28  ALT 19 23  ALKPHOS 80 105   BILITOT 0.3 0.5  PROT 4.8* 4.8*  ALBUMIN 1.6* 1.5*   No results for input(s): LIPASE, AMYLASE in the last 168 hours. No results for input(s): AMMONIA in the last 168 hours.  ABG    Component Value Date/Time   PHART 7.442 05/30/2018 0409   PCO2ART 53.8 (H) 05/30/2018 0409   PO2ART 53.0 (L) 05/30/2018 0409   HCO3 36.7 (H) 05/30/2018 0409   TCO2 38 (H) 05/30/2018 0409   ACIDBASEDEF 1.0 05/24/2018 1130   O2SAT 87.0 05/30/2018 0409     Coagulation Profile: No results for input(s): INR, PROTIME in the last 168 hours.  Cardiac Enzymes: Recent Labs  Lab 05/23/18 1052 05/23/18 1807  CKTOTAL 508*  --   TROPONINI <0.03 0.03*    HbA1C: No results found for: HGBA1C  CBG: Recent Labs  Lab 05/29/18 1521 05/29/18 2047 05/30/18 0037 05/30/18 0408 05/30/18 0753  GLUCAP 102* 111* 107* 106* 112*   cct 37 min   Simonne Martinet ACNP-BC [facility] Pulmonary/Critical Care Pager # [phone] OR # [phone] if no answer

## 2018-05-30 NOTE — Progress Notes (Signed)
eLink Physician-Brief Progress Note Patient Name: RHIANON ZABAWA DOB: November 25, 1950 MRN: [id number]   Date of Service  05/30/2018  HPI/Events of Note  Copious diarrhoea  eICU Interventions  Order to insert [employer]gan 05/30/2018, 4:42 AM

## 2018-05-30 NOTE — Progress Notes (Signed)
Due to desat to 82%, RT bag lavaged patient and suctioned out a moderate amount of thick tan secretions with a few small plugs.  Pt placed back on full vent support, sats recovered to 92%.  Spouse and Rn at bedside.

## 2018-05-31 LAB — BLOOD GAS, ARTERIAL
Acid-Base Excess: 11.7 mmol/L — ABNORMAL HIGH (ref 0.0–2.0)
BICARBONATE: 36.5 mmol/L — AB (ref 20.0–28.0)
Drawn by: 24513
FIO2: 0.4
LHR: 25 {breaths}/min
MECHVT: 400 mL
O2 Saturation: 92.8 %
PATIENT TEMPERATURE: 98.6
PCO2 ART: 55.2 mmHg — AB (ref 32.0–48.0)
PEEP: 10 cmH2O
PH ART: 7.436 (ref 7.350–7.450)
PO2 ART: 69 mmHg — AB (ref 83.0–108.0)

## 2018-05-31 LAB — CBC
HEMATOCRIT: 25.6 % — AB (ref 36.0–46.0)
HEMOGLOBIN: 8.5 g/dL — AB (ref 12.0–15.0)
MCH: 24.6 pg — ABNORMAL LOW (ref 26.0–34.0)
MCHC: 33.2 g/dL (ref 30.0–36.0)
MCV: 74 fL — ABNORMAL LOW (ref 80.0–100.0)
NRBC: 0.1 % (ref 0.0–0.2)
Platelets: 468 10*3/uL — ABNORMAL HIGH (ref 150–400)
RBC: 3.46 MIL/uL — ABNORMAL LOW (ref 3.87–5.11)
RDW: 16.3 % — ABNORMAL HIGH (ref 11.5–15.5)
WBC: 17.2 10*3/uL — AB (ref 4.0–10.5)

## 2018-05-31 LAB — GLUCOSE, CAPILLARY
GLUCOSE-CAPILLARY: 127 mg/dL — AB (ref 70–99)
GLUCOSE-CAPILLARY: 120 mg/dL — AB (ref 70–99)
Glucose-Capillary: 109 mg/dL — ABNORMAL HIGH (ref 70–99)
Glucose-Capillary: 111 mg/dL — ABNORMAL HIGH (ref 70–99)
Glucose-Capillary: 123 mg/dL — ABNORMAL HIGH (ref 70–99)
Glucose-Capillary: 125 mg/dL — ABNORMAL HIGH (ref 70–99)
Glucose-Capillary: 155 mg/dL — ABNORMAL HIGH (ref 70–99)

## 2018-05-31 LAB — BASIC METABOLIC PANEL
Anion gap: 8 (ref 5–15)
BUN: 35 mg/dL — AB (ref 8–23)
CHLORIDE: 97 mmol/L — AB (ref 98–111)
CO2: 36 mmol/L — ABNORMAL HIGH (ref 22–32)
Calcium: 8.9 mg/dL (ref 8.9–10.3)
Creatinine, Ser: 0.63 mg/dL (ref 0.44–1.00)
GFR calc Af Amer: >60 mL/min (ref >60)
GFR calc non Af Amer: >60 mL/min (ref >60)
Glucose, Bld: 117 mg/dL — ABNORMAL HIGH (ref 70–99)
POTASSIUM: 4.3 mmol/L (ref 3.5–5.1)
SODIUM: 141 mmol/L (ref 135–145)

## 2018-05-31 MED ORDER — MIDAZOLAM HCL 2 MG/2ML IJ SOLN
1.0000 mg | INTRAMUSCULAR | Status: DC | PRN
  Administered 2018-05-31 – 2018-06-02 (×10): 2 mg via INTRAVENOUS
  Filled 2018-05-31: qty 2

## 2018-05-31 MED ORDER — MIDAZOLAM HCL 2 MG/2ML IJ SOLN
INTRAMUSCULAR | Status: AC
  Filled 2018-05-31: qty 2

## 2018-05-31 MED ORDER — FENTANYL BOLUS VIA INFUSION
50.0000 ug | INTRAVENOUS | Status: DC | PRN
  Administered 2018-05-31 – 2018-06-02 (×4): 50 ug via INTRAVENOUS
  Filled 2018-05-31: qty 50

## 2018-05-31 MED ORDER — SODIUM CHLORIDE 0.9 % IV SOLN
2.0000 mg/h | INTRAVENOUS | Status: DC
  Administered 2018-05-31: 2 mg/h via INTRAVENOUS
  Administered 2018-06-01: 4.5 mg/h via INTRAVENOUS
  Administered 2018-06-01 – 2018-06-02 (×2): 7 mg/h via INTRAVENOUS
  Filled 2018-05-31 (×5): qty 10

## 2018-05-31 MED ORDER — SODIUM CHLORIDE 0.9 % IV SOLN
2.0000 g | Freq: Two times a day (BID) | INTRAVENOUS | Status: DC
  Administered 2018-05-31 – 2018-06-05 (×11): 2 g via INTRAVENOUS
  Filled 2018-05-31 (×11): qty 2

## 2018-05-31 MED ORDER — FUROSEMIDE 10 MG/ML IJ SOLN
40.0000 mg | Freq: Every day | INTRAMUSCULAR | Status: DC
  Administered 2018-05-31 – 2018-06-06 (×7): 40 mg via INTRAVENOUS
  Filled 2018-05-31 (×7): qty 4

## 2018-05-31 MED ORDER — POTASSIUM CHLORIDE 20 MEQ/15ML (10%) PO SOLN
40.0000 meq | Freq: Once | ORAL | Status: AC
  Administered 2018-05-31: 40 meq
  Filled 2018-05-31: qty 30

## 2018-05-31 MED ORDER — DEXMEDETOMIDINE HCL IN NACL 400 MCG/100ML IV SOLN
0.4000 ug/kg/h | INTRAVENOUS | Status: DC
  Administered 2018-05-31 – 2018-06-04 (×15): 1.2 ug/kg/h via INTRAVENOUS
  Administered 2018-06-05: 1 ug/kg/h via INTRAVENOUS
  Administered 2018-06-05 (×2): 1.2 ug/kg/h via INTRAVENOUS
  Filled 2018-05-31 (×21): qty 100

## 2018-05-31 MED ORDER — VANCOMYCIN HCL 500 MG IV SOLR
500.0000 mg | Freq: Two times a day (BID) | INTRAVENOUS | Status: DC
  Administered 2018-05-31 – 2018-06-02 (×5): 500 mg via INTRAVENOUS
  Filled 2018-05-31 (×5): qty 500

## 2018-05-31 NOTE — Progress Notes (Signed)
67 year old admitted 10/12 after being found unresponsive, presumed overdose with alcohol and benzodiazepines with severe aspiration pneumonia and ARDS  She required paralysis for 72 hours and this was discontinued 10/16. She remains critically ill on 50%/PEEP of 10 with saturation around 89%.  She appears tachypneic to 35 on Precedex and fentanyl drip with RA SS -2 eyes half open but not following commands. Anasarca has decreased, decreased breath sounds bilateral , mild secretions mild pattern, S1-S2 tacky, no pallor or icterus.  Chest x-ray 10/19 personally reviewed which shows right lower lobe with consolidation with slightly better aeration and improved aeration on the left.  Labs reviewed which show normal electrolytes, mild anemia and increasing leukocytosis.  Impression/plan ARDS -has improved since admit although not months since yesterday.  Continue PEEP of 10 and lung protective ventilation. Continue diuresis with Lasix 42 maintain equal to slight negative balance.  Aspiration pneumonia -was on ceftriaxone alone but given fevers and new leukocytosis , will broaden to cefepime and vancomycin and repeat cultures for HCAP  Intentional overdose of benzodiazepines-she will need psychiatric evaluation once acute issues resolved.  But I feel that she will need extensive rehab to recover from this episode. Husband and son updated at the bedside.  My critical care time x 70m  Cyril Mourning MD. FCCP. [place] [facility] Pager [phone] If no response call [phone]   05/31/2018

## 2018-05-31 NOTE — Progress Notes (Signed)
NAME:  Courtney Beard, MRN:  [id number], DOB:  [birth year], LOS: 8 ADMISSION DATE:  05/23/2018, CONSULTATION DATE:  10/12 REFERRING MD:  Dr. Blinda Leatherwood, CHIEF COMPLAINT:  Overdose    History Present Illness   67 year old female presents s/p cardiac arrest on 10/12 following aspiration event found unresponsive, covered in emesis, empty bottle of Klonopin and Zanaflex.  Was pulseless, CPR initiated by husband, had blood pressure on EMS arrival  Past Medical History  Anxiety, Depression, ETOH   Significant Hospital Events   10-12: Presents to ED, mechanically ventilated, central access placed, placed on vasoactive drips, antibiotics initiated 10/13: Placed on neuromuscular blockade given escalating PEEP/FiO2 requirements.  Norepinephrine needs worsened.  Placed on bicarbonate infusion 10/14: Still on high FiO2 and PEEP.  Left a little better when reviewing chest x-ray but right side still with dense consolidation.  Still on high pressor requirements.  Hyperchloremia and acidosis improved.  Stopping bicarbonate infusion.  Adding hypertonic saline neb as well as MetaNeb 10/15: She de-recruited during MetaNeb attempts so this was discontinued.  Shock resolved, BP elevated endotracheal tube at carina required repositioning chest x-ray worse requiring increased  10/16 through 10/19>> continue attempts at diuresis.  Tolerating weaning peep/fiO2. neuromuscular blockade discontinued 17th.  Ongoing aggressive diuresis resulting in decreasing PEEP and FiO2 requirements and improving serial chest x-rays.  From the 17th to the 19th she went from almost 12 L positive down to just 4 L positive. 10/20: PEEP and FiO2 stuck at 10 and 40 respectively.  Spiking fever, white blood cell count up to 17, vancomycin and cefepime added for nosocomial coverage diuresis continued  Consults: date of consult/date signed off & final recs:  PCCM  Procedures (surgical and bedside):  ETT 10/12 >> CVC 10/12 >>   Significant  Diagnostic Tests:  CXR 10/12 > 1. Endotracheal tube tip in the proximal right mainstem bronchus. Retraction by 4 cm recommended. 2. Nasogastric tube side port below the diaphragm but not clearly visualized. Dedicated abdominal radiograph may be helpful. 3. Large area of right parahilar and medial right lung apex consolidation which may indicate infection or neoplastic process. Chest CT or Followup PA and lateral chest X-ray is recommended in 3-4 weeks following trial of antibiotic therapy to ensure resolution and exclude underlying malignancy. CT Head/C-Spine 10/12 > 1. No acute intracranial abnormality.2. No acute fracture of the cervical spine. 3. Large area of right upper lobe consolidation, possibly secondary to aspiration. TTE 11/12 >> EF 60 to 65%, normal RV, estimated PASP 44 mmHg  Micro Data:  Blood 10/12 >> U/A 10/12 >> negative Resp 10/12>>Normal flora Urine culture 10/17 >>> negative Sputum culture 10/17:>>> Few GPC few yeast>>> Repeat sputum culture 10/20>>> Antimicrobials:  Zosyn 10/12 >> 10/15 Rocephin 10/15>>> 10/20 Vanco 10/13 >> 10/14 Cefepime 10/20 Vancomycin 10/20  Subjective/interval hx:  Awake a little tachypneic Objective   Blood pressure (Abnormal) 168/74, pulse (Abnormal) 119, temperature 100.2 F (37.9 C), resp. rate (Abnormal) 27, height 5\' 2"  (1.575 m), weight 56.9 kg, SpO2 91 %. CVP:  [3 mmHg-8 mmHg] 7 mmHg  Vent Mode: PRVC FiO2 (%):  [40 %] 40 % Set Rate:  [25 bmp] 25 bmp Vt Set:  [400 mL] 400 mL PEEP:  [10 cmH20] 10 cmH20 Plateau Pressure:  [14 cmH20-23 cmH20] 14 cmH20   Intake/Output Summary (Last 24 hours) at 05/31/2018 0828 Last data filed at 05/31/2018 0700 Gross per 24 hour  Intake 1072.87 ml  Output 4665 ml  Net -3592.13 ml   [employer]  05/29/18 0449 05/30/18 0353 05/31/18 0401  Weight: 63.8 kg 62.1 kg 56.9 kg    Examination:  General: 67 year old female patient currently on full ventilator support HEENT normocephalic  atraumatic no jugular venous distention mucous membranes moist tongue is moist Pulmonary: Scattered rhonchi diminished on the right side Cardiac: Tachycardic regular rate and rhythm Abdomen: Soft nontender no organomegaly Extremities: Decreasing generalized edema brisk capillary refill strong pulses Neuro: More awake, nonfocal, moves extremities GU: Clear yellow  Resolved Hospital Problem list   Lactic acidosis Hyperkalemia Non-anion gap metabolic acidosis Septic shock resolved 10/15 Relative adrenal deficiency Leukopenia   Assessment & Plan:   Acute Hypoxic Respiratory Distress in setting of toxic ingestion, aspiration PNA -Rising white blood cell count, brown/yellow sputum and fever raising concern for hcap Plan  Continuing full ventilator support, holding PEEP at 10 today  Repeat Lasix again, we can change this to daily  PAD protocol as mentioned for RASS goal -1  VAP bundle  A.m. chest x-ray  Antibiotics for nosocomial coverage   Fever with mild leukocytosis, rule out hcap -White blood cell count and fever curve increased again Plan Add nosocomial coverage with vancomycin and cefepime Sputum culture  Questionable Cardiac Arrest (PEA) due to Overdose  Prolonged QTC -Husband was unable to palpate pulse, when EMS arrived patient was hypotension with pulse  -Echocardiogram reassuring -shock resolved  Plan  Telemetry   At risk for fluid and electrolyte imbalance: Hyperkalemia, suspect hemolyzed Plan Daily chemistries with diuresis  Acute toxic metabolic encephalopathy following overdose, benzo and Zanaflex plus alcohol H/O Anxiety/Depression, ETOH  -Not clear if this was intentional or not Plan  RASS goal -1  Check 12 lead; if < 0.5 will increase Seroquel to 75 bid  Continue Precedex and fentanyl  Plan for psychiatric evaluation post extubation    Disposition / Summary of Today's Plan 05/31/18   Seem to hit a plateau, a little concerned about hcap Will repeat  sputum, wide antibiotics, continue care as outlined above    Diet: NPO now on full dose tube feeding Pain/Anxiety/Delirium protocol : Fentanyl drip, ranging Versed to Precedex on 10/19, RASS goal -1 VAP protocol: In place DVT prophylaxis: Heparin  GI prophylaxis: PPI Hyperglycemia protocol Mobility: Bedrest  Code Status: Full Code  Family Communication: Son and husband updated at bedside 10/ 17  Labs   CBC: Recent Labs  Lab 05/27/18 0450 05/28/18 0426 05/29/18 0502 05/30/18 0359 05/31/18 0406  WBC 9.1 11.4* 13.9* 13.9* 17.2*  HGB 8.0* 7.4* 8.2* 7.8* 8.5*  HCT 25.2* 21.9* 24.8* 24.1* 25.6*  MCV 78.5* 73.7* 74.7* 74.6* 74.0*  PLT 184 183 247 299 468*    Basic Metabolic Panel: Recent Labs  Lab 05/26/18 0445 05/27/18 0450 05/27/18 0828 05/28/18 0426 05/29/18 0502 05/30/18 0515 05/30/18 1023 05/31/18 0406  NA 140 142  --  141 142 141 142 141  K 3.8 3.8  --  3.0* 3.8 6.2* 4.5 4.3  CL 103 102  --  103 100 99 99 97*  CO2 29 33*  --  33* 33* 35* 34* 36*  GLUCOSE 114* 111*  --  100* 104* 103* 120* 117*  BUN 20 23  --  24* 25* 32* 32* 35*  CREATININE 0.59 0.62  --  0.40* 0.67 0.68 0.69 0.63  CALCIUM 8.2* 8.6*  --  8.1* 8.6* 8.5* 8.6* 8.9  MG 1.9 1.6* 2.1 1.8 2.0 2.4  --   --   PHOS 2.3* 1.9*  --  2.6 4.4 5.0*  --   --  GFR: Estimated Creatinine Clearance: 55.4 mL/min (by C-G formula based on SCr of 0.63 mg/dL). Recent Labs  Lab 05/25/18 0410  05/28/18 0426 05/28/18 1300 05/29/18 0502 05/30/18 0359 05/31/18 0406  PROCALCITON 12.24  --   --  2.22  --   --   --   WBC 6.8   < > 11.4*  --  13.9* 13.9* 17.2*   < > = values in this interval not displayed.    Liver Function Tests: Recent Labs  Lab 05/27/18 0450 05/29/18 0502  AST 31 28  ALT 19 23  ALKPHOS 80 105  BILITOT 0.3 0.5  PROT 4.8* 4.8*  ALBUMIN 1.6* 1.5*   No results for input(s): LIPASE, AMYLASE in the last 168 hours. No results for input(s): AMMONIA in the last 168 hours.  ABG    Component  Value Date/Time   PHART 7.436 05/31/2018 0352   PCO2ART 55.2 (H) 05/31/2018 0352   PO2ART 69.0 (L) 05/31/2018 0352   HCO3 36.5 (H) 05/31/2018 0352   TCO2 38 (H) 05/30/2018 0409   ACIDBASEDEF 1.0 05/24/2018 1130   O2SAT 92.8 05/31/2018 0352     Coagulation Profile: No results for input(s): INR, PROTIME in the last 168 hours.  Cardiac Enzymes: No results for input(s): CKTOTAL, CKMB, CKMBINDEX, TROPONINI in the last 168 hours.  HbA1C: No results found for: HGBA1C  CBG: Recent Labs  Lab 05/30/18 1615 05/30/18 2029 05/31/18 0039 05/31/18 0414 05/31/18 0744  GLUCAP 112* 112* 111* 109* 123*   cct 37 min   Simonne Martinet ACNP-BC [facility] Pulmonary/Critical Care Pager # [phone] OR # [phone] if no answer

## 2018-05-31 NOTE — Progress Notes (Signed)
eLink Physician-Brief Progress Note Patient Name: Courtney Beard DOB: March 11, 1951 MRN: [id number]   Date of Service  05/31/2018  HPI/Events of Note  Severe agitation from ETOH and drug withdrawal.  Not controlled on precedex and fentanyl drips  eICU Interventions  Begin versed drip and attempt to titrate precedex off      Intervention Category Minor Interventions: Agitation / anxiety - evaluation and management  Henry Russel, P 05/31/2018, 9:01 PM

## 2018-05-31 NOTE — Progress Notes (Signed)
Pharmacy Antibiotic Note  Courtney Beard is a 67 y.o. female with concern for HCAP.  Plan: Dc ceftriaxone Add cefepime 2 g q12h Add vanc 500 q12h Monitor renal fx cx vt prn  Height: 5\' 2"  (157.5 cm) Weight: 125 lb 7.1 oz (56.9 kg) IBW/kg (Calculated) : 50.1  Temp (24hrs), Avg:100 F (37.8 C), Min:96.8 F (36 C), Max:101.5 F (38.6 C)  Recent Labs  Lab 05/27/18 0450 05/28/18 0426 05/29/18 0502 05/30/18 0359 05/30/18 0515 05/30/18 1023 05/31/18 0406  WBC 9.1 11.4* 13.9* 13.9*  --   --  17.2*  CREATININE 0.62 0.40* 0.67  --  0.68 0.69 0.63    Estimated Creatinine Clearance: 55.4 mL/min (by C-G formula based on SCr of 0.63 mg/dL).    Not on File  Isaac Bliss, PharmD, BCPS, BCCCP Clinical Pharmacist [phone]  Please check AMION for all [facility] numbers  05/31/2018 9:04 AM

## 2018-06-01 ENCOUNTER — Inpatient Hospital Stay: Payer: Self-pay

## 2018-06-01 ENCOUNTER — Inpatient Hospital Stay (HOSPITAL_COMMUNITY): Payer: Medicare HMO

## 2018-06-01 LAB — CBC
HEMATOCRIT: 22.8 % — AB (ref 36.0–46.0)
HEMOGLOBIN: 7.9 g/dL — AB (ref 12.0–15.0)
MCH: 25.5 pg — AB (ref 26.0–34.0)
MCHC: 34.6 g/dL (ref 30.0–36.0)
MCV: 73.5 fL — ABNORMAL LOW (ref 80.0–100.0)
Platelets: 529 10*3/uL — ABNORMAL HIGH (ref 150–400)
RBC: 3.1 MIL/uL — AB (ref 3.87–5.11)
RDW: 16.3 % — AB (ref 11.5–15.5)
WBC: 20.8 10*3/uL — ABNORMAL HIGH (ref 4.0–10.5)
nRBC: 0 % (ref 0.0–0.2)

## 2018-06-01 LAB — CULTURE, RESPIRATORY

## 2018-06-01 LAB — COMPREHENSIVE METABOLIC PANEL
ALBUMIN: 1.5 g/dL — AB (ref 3.5–5.0)
ALT: 40 U/L (ref 0–44)
ANION GAP: 4 — AB (ref 5–15)
AST: 40 U/L (ref 15–41)
Alkaline Phosphatase: 119 U/L (ref 38–126)
BILIRUBIN TOTAL: 0.5 mg/dL (ref 0.3–1.2)
BUN: 35 mg/dL — ABNORMAL HIGH (ref 8–23)
CHLORIDE: 102 mmol/L (ref 98–111)
CO2: 34 mmol/L — AB (ref 22–32)
Calcium: 8.3 mg/dL — ABNORMAL LOW (ref 8.9–10.3)
Creatinine, Ser: 0.52 mg/dL (ref 0.44–1.00)
GFR calc Af Amer: >60 mL/min (ref >60)
GFR calc non Af Amer: >60 mL/min (ref >60)
GLUCOSE: 119 mg/dL — AB (ref 70–99)
POTASSIUM: 3.7 mmol/L (ref 3.5–5.1)
SODIUM: 140 mmol/L (ref 135–145)
TOTAL PROTEIN: 5.1 g/dL — AB (ref 6.5–8.1)

## 2018-06-01 LAB — GLUCOSE, CAPILLARY
GLUCOSE-CAPILLARY: 100 mg/dL — AB (ref 70–99)
GLUCOSE-CAPILLARY: 103 mg/dL — AB (ref 70–99)
GLUCOSE-CAPILLARY: 109 mg/dL — AB (ref 70–99)
GLUCOSE-CAPILLARY: 113 mg/dL — AB (ref 70–99)
GLUCOSE-CAPILLARY: 98 mg/dL (ref 70–99)
GLUCOSE-CAPILLARY: 99 mg/dL (ref 70–99)

## 2018-06-01 LAB — PHOSPHORUS: Phosphorus: 3.3 mg/dL (ref 2.5–4.6)

## 2018-06-01 LAB — MAGNESIUM: Magnesium: 2.4 mg/dL (ref 1.7–2.4)

## 2018-06-01 LAB — CULTURE, RESPIRATORY W GRAM STAIN

## 2018-06-01 MED ORDER — QUETIAPINE FUMARATE 100 MG PO TABS
100.0000 mg | ORAL_TABLET | Freq: Two times a day (BID) | ORAL | Status: DC
  Administered 2018-06-01 – 2018-06-09 (×16): 100 mg
  Filled 2018-06-01 (×16): qty 1

## 2018-06-01 MED ORDER — SODIUM CHLORIDE 0.9% FLUSH
10.0000 mL | INTRAVENOUS | Status: DC | PRN
  Administered 2018-06-18 – 2018-06-19 (×2): 10 mL
  Filled 2018-06-01 (×2): qty 40

## 2018-06-01 MED ORDER — SODIUM CHLORIDE 0.9% FLUSH
10.0000 mL | Freq: Two times a day (BID) | INTRAVENOUS | Status: DC
  Administered 2018-06-01 – 2018-06-14 (×19): 10 mL
  Administered 2018-06-15: 20 mL
  Administered 2018-06-15: 10 mL
  Administered 2018-06-17: 20 mL
  Administered 2018-06-18: 10 mL

## 2018-06-01 MED ORDER — CLONAZEPAM 0.5 MG PO TBDP
0.5000 mg | ORAL_TABLET | Freq: Three times a day (TID) | ORAL | Status: DC
  Administered 2018-06-01 – 2018-06-06 (×16): 0.5 mg via ORAL
  Filled 2018-06-01 (×16): qty 1

## 2018-06-01 MED ORDER — CLONAZEPAM 0.5 MG PO TBDP: 1.0000 mg | ORAL_TABLET | Freq: Three times a day (TID) | ORAL | Status: DC

## 2018-06-01 MED ORDER — CHLORHEXIDINE GLUCONATE CLOTH 2 % EX PADS
6.0000 | MEDICATED_PAD | Freq: Every day | CUTANEOUS | Status: DC
  Administered 2018-06-01 – 2018-06-19 (×16): 6 via TOPICAL

## 2018-06-01 NOTE — Progress Notes (Signed)
Peripherally Inserted Central Catheter/Midline Placement  The IV Nurse has discussed with the patient and/or persons authorized to consent for the patient, the purpose of this procedure and the potential benefits and risks involved with this procedure.  The benefits include less needle sticks, lab draws from the catheter, and the patient may be discharged home with the catheter. Risks include, but not limited to, infection, bleeding, blood clot (thrombus formation), and puncture of an artery; nerve damage and irregular heartbeat and possibility to perform a PICC exchange if needed/ordered by physician.  Alternatives to this procedure were also discussed.  [employer] PICC patient education guide, fact sheet on infection prevention and patient information card has been provided to patient /or left at bedside.    PICC/Midline Placement Documentation  PICC Double Lumen 06/01/18 PICC Right Basilic 38 cm (Active)  Indication for Insertion or Continuance of Line Prolonged intravenous therapies 06/01/2018  2:00 PM  Site Assessment Clean;Dry;Intact 06/01/2018  2:00 PM  Lumen #1 Status Flushed;Blood return noted 06/01/2018  2:00 PM  Lumen #2 Status Flushed;Blood return noted 06/01/2018  2:00 PM  Dressing Type Transparent 06/01/2018  2:00 PM  Dressing Status Clean;Dry;Intact;Antimicrobial disc in place 06/01/2018  2:00 PM  Dressing Change Due 06/08/18 06/01/2018  2:00 PM       Stacie Glaze Horton 06/01/2018, 2:52 PM

## 2018-06-01 NOTE — Progress Notes (Signed)
Cori,RN is aware of the central line removal order and will remove the IJ CVC after the PICC is placed.  Penni Bombard Ruven Corradi,RN-VAST

## 2018-06-01 NOTE — Progress Notes (Signed)
Paged Dr. Kendrick Fries regarding his mention of a PICC insertion and he requests a double lumen PICC. We are to DC the right IJ CVC once PICC is placed. Verbal orders/consults have been placed in EPIC. Patient still receiving hyperosmolar solutions through IV.   Darrold Span, RN

## 2018-06-01 NOTE — Progress Notes (Signed)
NAME:  Courtney Beard, MRN:  [id number], DOB:  1951-04-28, LOS: 9 ADMISSION DATE:  05/23/2018, CONSULTATION DATE:  05/23/2018 REFERRING MD:  Blinda Leatherwood, CHIEF COMPLAINT:  Overdose   Brief History   67 y/o female admitted post cardiac arrest in setting of presumed intentional overdose with benzodiazepine and muscle relaxants.  Bystander CPR initiated, has aspiration pneumonia and ARDS.   Past Medical History  Anxiety, depression, EtOH  Significant Hospital Events   10-12: Presents to ED, mechanically ventilated, central access placed, placed on vasoactive drips, antibiotics initiated 10/13: Placed on neuromuscular blockade given escalating PEEP/FiO2 requirements.  Norepinephrine needs worsened.  Placed on bicarbonate infusion 10/14: Still on high FiO2 and PEEP.  Left a little better when reviewing chest x-ray but right side still with dense consolidation.  Still on high pressor requirements.  Hyperchloremia and acidosis improved.  Stopping bicarbonate infusion.  Adding hypertonic saline neb as well as MetaNeb 10/15: She de-recruited during MetaNeb attempts so this was discontinued.  Shock resolved, BP elevated endotracheal tube at carina required repositioning chest x-ray worse requiring increased  10/16 through 10/19>> continue attempts at diuresis.  Tolerating weaning peep/fiO2. neuromuscular blockade discontinued 17th.  Ongoing aggressive diuresis resulting in decreasing PEEP and FiO2 requirements and improving serial chest x-rays.  From the 17th to the 19th she went from almost 12 L positive down to just 4 L positive. 10/20: PEEP and FiO2 stuck at 10 and 40 respectively.  Spiking fever, white blood cell count up to 17, vancomycin and cefepime added for nosocomial coverage diuresis continued  Consults:     Procedures (surgical and bedside):  ETT 10/12 >> CVC 10/12 >>   Significant Diagnostic Tests:  CXR 10/12 > 1. Endotracheal tube tip in the proximal right mainstem  bronchus. Retraction by 4 cm recommended. 2. Nasogastric tube side port below the diaphragm but not clearly visualized. Dedicated abdominal radiograph may be helpful. 3. Large area of right parahilar and medial right lung apex consolidation which may indicate infection or neoplastic process. Chest CT or Followup PA and lateral chest X-ray is recommended in 3-4 weeks following trial of antibiotic therapy to ensure resolution and exclude underlying malignancy. CT Head/C-Spine 10/12 > 1. No acute intracranial abnormality.2. No acute fracture of the cervical spine. 3. Large area of right upper lobe consolidation, possibly secondary to aspiration. TTE 11/12 >> EF 60 to 65%, normal RV, estimated PASP 44 mmHg  Micro Data:  Blood 10/12 >> U/A 10/12 >> negative Resp 10/12>>Normal flora Urine culture 10/17 >>> negative Sputum culture 10/17:>>> Few GPC few yeast>>> Repeat sputum culture 10/20>>> GPC pairs, GNR  Antimicrobials:  Zosyn 10/12 >> 10/15 Rocephin 10/15>>> 10/20 Vanco 10/13 >> 10/14 Cefepime 10/20 Vancomycin 10/20   Subjective:  No acute events Oxygenation remains about the same   Objective   Blood pressure (!) 101/56, pulse 92, temperature 98.8 F (37.1 C), resp. rate (!) 32, height 5\' 2"  (1.575 m), weight 54.8 kg, SpO2 96 %. CVP:  [2 mmHg-11 mmHg] 11 mmHg  Vent Mode: PRVC FiO2 (%):  [40 %] 40 % Set Rate:  [32 bmp] 32 bmp Vt Set:  [400 mL] 400 mL PEEP:  [10 cmH20] 10 cmH20 Plateau Pressure:  [17 cmH20-24 cmH20] 17 cmH20   Intake/Output Summary (Last 24 hours) at 06/01/2018 0853 Last data filed at 06/01/2018 0800 Gross per 24 hour  Intake 2374.2 ml  Output 3830 ml  Net -1455.8 ml   [employer]   05/30/18 0353 05/31/18 0401 06/01/18 0112  Weight: 62.1 kg 56.9  kg 54.8 kg    Examination:  General:  In bed on vent HENT: NCAT ETT in place PULM: CTA B, vent supported breathing CV: RRR, no mgr GI: BS+, soft, nontender MSK: normal bulk and tone Neuro: sedated  on vent    Resolved Hospital Problem list   Septic shock 10/15  Assessment & Plan:  ARDS: oxygenation about the same, I think the fever and leukocytosis are due to the fibrotic phase of ARDS, suspect oxygenation will take weeks to recover Continue full vent support Goal paO2 55-65, titrate PEEP/FiO2 for that ABG in AM VAP prevention Continue lasix  HCAP:  Continue broad spectrum antibiotics, f/u culture  PEA arrest: continue current management  Acute toxic metabolic encephalopathy For now needs high dose sedation for vent synchrony, wean as her oxygenation improves this week   Disposition / Summary of Today's Plan 06/01/18   Read above     Diet: tube feeding Pain/Anxiety/Delirium protocol (if indicated):fentanyl, precedex, versed per PAD protocol VAP protocol (if indicated): yes DVT prophylaxis: sub q hep GI prophylaxis: famotidine Hyperglycemia protocol: monitor glucose Mobility: hold PT consult until oxygenation improves Code Status: full Family Communication: updated husband at length on 10/21  Labs   CBC: Recent Labs  Lab 05/28/18 0426 05/29/18 0502 05/30/18 0359 05/31/18 0406 06/01/18 0349  WBC 11.4* 13.9* 13.9* 17.2* 20.8*  HGB 7.4* 8.2* 7.8* 8.5* 7.9*  HCT 21.9* 24.8* 24.1* 25.6* 22.8*  MCV 73.7* 74.7* 74.6* 74.0* 73.5*  PLT 183 247 299 468* 529*    Basic Metabolic Panel: Recent Labs  Lab 05/27/18 0450 05/27/18 0828 05/28/18 0426 05/29/18 0502 05/30/18 0515 05/30/18 1023 05/31/18 0406 06/01/18 0349  NA 142  --  141 142 141 142 141 140  K 3.8  --  3.0* 3.8 6.2* 4.5 4.3 3.7  CL 102  --  103 100 99 99 97* 102  CO2 33*  --  33* 33* 35* 34* 36* 34*  GLUCOSE 111*  --  100* 104* 103* 120* 117* 119*  BUN 23  --  24* 25* 32* 32* 35* 35*  CREATININE 0.62  --  0.40* 0.67 0.68 0.69 0.63 0.52  CALCIUM 8.6*  --  8.1* 8.6* 8.5* 8.6* 8.9 8.3*  MG 1.6* 2.1 1.8 2.0 2.4  --   --  2.4  PHOS 1.9*  --  2.6 4.4 5.0*  --   --  3.3   GFR: Estimated  Creatinine Clearance: 55.4 mL/min (by C-G formula based on SCr of 0.52 mg/dL). Recent Labs  Lab 05/28/18 1300 05/29/18 0502 05/30/18 0359 05/31/18 0406 06/01/18 0349  PROCALCITON 2.22  --   --   --   --   WBC  --  13.9* 13.9* 17.2* 20.8*    Liver Function Tests: Recent Labs  Lab 05/27/18 0450 05/29/18 0502 06/01/18 0349  AST 31 28 40  ALT 19 23 40  ALKPHOS 80 105 119  BILITOT 0.3 0.5 0.5  PROT 4.8* 4.8* 5.1*  ALBUMIN 1.6* 1.5* 1.5*   No results for input(s): LIPASE, AMYLASE in the last 168 hours. No results for input(s): AMMONIA in the last 168 hours.  ABG    Component Value Date/Time   PHART 7.436 05/31/2018 0352   PCO2ART 55.2 (H) 05/31/2018 0352   PO2ART 69.0 (L) 05/31/2018 0352   HCO3 36.5 (H) 05/31/2018 0352   TCO2 38 (H) 05/30/2018 0409   ACIDBASEDEF 1.0 05/24/2018 1130   O2SAT 92.8 05/31/2018 0352     Coagulation Profile: No results for input(s): INR,  PROTIME in the last 168 hours.  Cardiac Enzymes: No results for input(s): CKTOTAL, CKMB, CKMBINDEX, TROPONINI in the last 168 hours.  HbA1C: No results found for: HGBA1C  CBG: Recent Labs  Lab 05/31/18 1618 05/31/18 1945 05/31/18 2312 06/01/18 0357 06/01/18 0746  GLUCAP 127* 120* 125* 99 109*   My cc time 35 minutes  Heber [place], MD [place] P[facility]M Pager: [phone] Cell: [phone] After 3pm or if no response, call [phone]

## 2018-06-01 NOTE — Progress Notes (Signed)
Called in reference to sedation needs.  Pt currently maxed out on fentanyl 455mcg/hr, precedex 1.2 mcg/kg/hr, and versed 7mg /hr and continues to intermittently bite ETT with agitation.   She is currently synchronous with vent.  QTc 0.39  P:  Increased seroquel from 50 to 100mg  BID klonopin 0.5mg  TID started in attempts to wean precedex/ versed.     Courtney Beard, AGACNP-BC [place] Pulmonary & Critical Care Pgr: [phone] or if no answer [phone] 06/01/2018, 8:01 PM

## 2018-06-01 NOTE — Progress Notes (Signed)
Spoke with RN Denyse Amass about the placement of a PICC line. At this time, a PIV will be put on hold until PICC is placed and then if the patient still needs a PIV then one will be placed.

## 2018-06-01 NOTE — Progress Notes (Signed)
Concern for aspiration of tube feedings, increased secretions suctioned from ETT, appear to be possible tube feed. Tube feedings stopped and E link notified, MD to place orders. Will continue to closely monitor.

## 2018-06-01 NOTE — Progress Notes (Signed)
   06/01/18 1200  Clinical Encounter Type  Visited With Patient and family together  Visit Type Follow-up  Referral From Other (Comment)  Spiritual Encounters  Spiritual Needs Emotional  Stress Factors  Family Stress Factors None identified   Followed up on initial visit. PT was not able to talk and husband was at bedside. Husband was thankful for my visit and for the time I spend with them both times. I offered spiritual care with ministry of presence and words of encouragement. Chaplain available as needed.   Chaplain Orest Dikes  [phone]

## 2018-06-01 NOTE — Progress Notes (Signed)
VAST RN to unit to speak with pt RN as phone system is malfunctioning. Spoke with Kandee Keen, RN who stated pt needs 3 separate lines and physician wants IJ dc'd and PICC placed. Cory to speak to physician about a triple lumen PICC and will place appropriate order/consult once decision made.

## 2018-06-02 ENCOUNTER — Inpatient Hospital Stay (HOSPITAL_COMMUNITY): Payer: Medicare HMO

## 2018-06-02 LAB — POCT I-STAT 3, ART BLOOD GAS (G3+)
ACID-BASE EXCESS: 9 mmol/L — AB (ref 0.0–2.0)
Acid-Base Excess: 10 mmol/L — ABNORMAL HIGH (ref 0.0–2.0)
BICARBONATE: 32.9 mmol/L — AB (ref 20.0–28.0)
BICARBONATE: 34.3 mmol/L — AB (ref 20.0–28.0)
O2 SAT: 96 %
O2 SAT: 95 %
PCO2 ART: 15.3 mmHg — AB (ref 32.0–48.0)
PO2 ART: 16 mmHg — AB (ref 83.0–108.0)
Patient temperature: 13
Pressure control: 1 cmH2O
TCO2: 34 mmol/L — AB (ref 22–32)
TCO2: 36 mmol/L — AB (ref 22–32)
pCO2 arterial: 48 mmHg (ref 32.0–48.0)
pH, Arterial: 7.849 (ref 7.350–7.450)
pH, Arterial: 7.463 — ABNORMAL HIGH (ref 7.350–7.450)
pO2, Arterial: 75 mmHg — ABNORMAL LOW (ref 83.0–108.0)

## 2018-06-02 LAB — BASIC METABOLIC PANEL
ANION GAP: 8 (ref 5–15)
BUN: 31 mg/dL — AB (ref 8–23)
CHLORIDE: 101 mmol/L (ref 98–111)
CO2: 31 mmol/L (ref 22–32)
Calcium: 8.3 mg/dL — ABNORMAL LOW (ref 8.9–10.3)
Creatinine, Ser: 0.49 mg/dL (ref 0.44–1.00)
Glucose, Bld: 106 mg/dL — ABNORMAL HIGH (ref 70–99)
POTASSIUM: 3.2 mmol/L — AB (ref 3.5–5.1)
SODIUM: 140 mmol/L (ref 135–145)

## 2018-06-02 LAB — GLUCOSE, CAPILLARY
GLUCOSE-CAPILLARY: 127 mg/dL — AB (ref 70–99)
GLUCOSE-CAPILLARY: 99 mg/dL (ref 70–99)
Glucose-Capillary: 102 mg/dL — ABNORMAL HIGH (ref 70–99)
Glucose-Capillary: 88 mg/dL (ref 70–99)
Glucose-Capillary: 95 mg/dL (ref 70–99)
Glucose-Capillary: 93 mg/dL (ref 70–99)

## 2018-06-02 LAB — CULTURE, RESPIRATORY W GRAM STAIN

## 2018-06-02 LAB — CULTURE, RESPIRATORY

## 2018-06-02 MED ORDER — LORAZEPAM 2 MG/ML IJ SOLN
0.5000 mg/h | INTRAVENOUS | Status: DC
  Administered 2018-06-02: 0.5 mg/h via INTRAVENOUS
  Administered 2018-06-02: 2.75 mg/h via INTRAVENOUS
  Administered 2018-06-04 (×2): 2.25 mg/h via INTRAVENOUS
  Filled 2018-06-02 (×4): qty 25

## 2018-06-02 MED ORDER — POTASSIUM CHLORIDE 20 MEQ/15ML (10%) PO SOLN
40.0000 meq | Freq: Two times a day (BID) | ORAL | Status: DC
  Administered 2018-06-03 – 2018-06-18 (×31): 40 meq
  Filled 2018-06-02 (×34): qty 30

## 2018-06-02 MED ORDER — POTASSIUM CHLORIDE 20 MEQ/15ML (10%) PO SOLN
30.0000 meq | ORAL | Status: AC
  Administered 2018-06-02 (×2): 30 meq
  Filled 2018-06-02 (×2): qty 30

## 2018-06-02 MED ORDER — LORAZEPAM 2 MG/ML IJ SOLN
INTRAMUSCULAR | Status: AC
  Administered 2018-06-02: 2 mg via INTRAVENOUS
  Filled 2018-06-02: qty 1

## 2018-06-02 MED ORDER — HYDROMORPHONE HCL 2 MG/ML IJ SOLN
4.0000 mg | Freq: Once | INTRAMUSCULAR | Status: AC
  Administered 2018-06-02: 4 mg via INTRAVENOUS
  Filled 2018-06-02: qty 2

## 2018-06-02 MED ORDER — POLYETHYLENE GLYCOL 3350 17 G PO PACK
17.0000 g | PACK | Freq: Every day | ORAL | Status: DC | PRN
  Administered 2018-06-02: 17 g via ORAL
  Filled 2018-06-02 (×2): qty 1

## 2018-06-02 MED ORDER — LORAZEPAM 2 MG/ML IJ SOLN
2.0000 mg | Freq: Once | INTRAMUSCULAR | Status: AC
  Administered 2018-06-02: 2 mg via INTRAVENOUS

## 2018-06-02 MED ORDER — SODIUM CHLORIDE 0.9 % IV SOLN
0.5000 mg/h | INTRAVENOUS | Status: DC
  Administered 2018-06-02: 0.5 mg/h via INTRAVENOUS
  Administered 2018-06-03: 3 mg/h via INTRAVENOUS
  Administered 2018-06-04: 2 mg/h via INTRAVENOUS
  Filled 2018-06-02 (×3): qty 5

## 2018-06-02 MED ORDER — HYDROMORPHONE HCL 2 MG/ML IJ SOLN: 4.0000 mg | Freq: Once | INTRAMUSCULAR | Status: DC

## 2018-06-02 MED ORDER — SENNOSIDES 8.8 MG/5ML PO SYRP
5.0000 mL | ORAL_SOLUTION | Freq: Every day | ORAL | Status: DC | PRN
  Administered 2018-06-03: 5 mL
  Filled 2018-06-02: qty 5

## 2018-06-02 MED ORDER — BISACODYL 10 MG RE SUPP
10.0000 mg | Freq: Every day | RECTAL | Status: DC | PRN
  Administered 2018-06-03: 10 mg via RECTAL
  Filled 2018-06-02: qty 1

## 2018-06-02 NOTE — Progress Notes (Signed)
Spoke with MD, Dr. Kendrick Fries, regarding soft BPs, systolic 83 and MAP 62 for the past 1-2 hrs. Attempting to decrease sedation medications to help stabilize BP. MD verbalized to contact if MAP <55. Continue to monitor.   Darrold Span, RN

## 2018-06-02 NOTE — Progress Notes (Signed)
10 mg (5 mL) of IV lorazepam were left over from the compounding of the IV drip. This was wasted in the cleanroom's sharps container. Courtney Cox, CPhT, was witness.

## 2018-06-02 NOTE — Progress Notes (Signed)
Wasted 100 mL of quad strength fentanyl & 20 mL of versed with Saralyn Pilar in waste disposal.  Darrold Span, RN

## 2018-06-02 NOTE — Progress Notes (Signed)
..  [facility] ADULT ICU REPLACEMENT PROTOCOL FOR AM LAB REPLACEMENT ONLY  The patient does apply for the [facility] Adult ICU Electrolyte Replacment Protocol based on the criteria listed below:   1. Is GFR >/= 40 ml/min? Yes.    Patient's GFR today is >60 2. Is urine output >/= 0.5 ml/kg/hr for the last 6 hours? Yes.   Patient's UOP is 1.14 ml/kg/hr 3. Is BUN < 60 mg/dL? Yes.    Patient's BUN today is 31 4. Abnormal electrolyte(s): K 3.2  5. Ordered repletion with: protocol 6. If a panic level lab has been reported, has the CCM MD in charge been notified? Yes.  .   Physician:  Ples Specter 06/02/2018 6:22 AM

## 2018-06-02 NOTE — Progress Notes (Signed)
RT pulled ABG and ran on ISTAT.  RT put in the wrong temp for pt causing the result to be incorrect.  Form sent to POC for correction. Oncoming RT and RN aware.

## 2018-06-02 NOTE — Progress Notes (Signed)
CPT held at this time as pt is very agitated. RN made aware

## 2018-06-02 NOTE — Progress Notes (Signed)
7 mL (14 mg) of IV lorazepam was wasted in the cleanroom's sharps container during the compounding of the ptnt's IV. Samiria Swinton, CPhT, was witness.

## 2018-06-02 NOTE — Progress Notes (Signed)
NAME:  Courtney Beard, MRN:  [id number], DOB:  [birth year], LOS: 10 ADMISSION DATE:  05/23/2018, CONSULTATION DATE:  05/23/2018 REFERRING MD:  Blinda Leatherwood, CHIEF COMPLAINT:  Overdose   Brief History   67 y/o female admitted post cardiac arrest in setting of presumed intentional overdose with benzodiazepine and muscle relaxants.  Bystander CPR initiated, has aspiration pneumonia and ARDS.   Past Medical History  Anxiety, depression, EtOH  Significant Hospital Events   10-12: Presents to ED, mechanically ventilated, central access placed, placed on vasoactive drips, antibiotics initiated 10/13: Placed on neuromuscular blockade given escalating PEEP/FiO2 requirements.  Norepinephrine needs worsened.  Placed on bicarbonate infusion 10/14: Still on high FiO2 and PEEP.  Left a little better when reviewing chest x-ray but right side still with dense consolidation.  Still on high pressor requirements.  Hyperchloremia and acidosis improved.  Stopping bicarbonate infusion.  Adding hypertonic saline neb as well as MetaNeb 10/15: She de-recruited during MetaNeb attempts so this was discontinued.  Shock resolved, BP elevated endotracheal tube at [place] required repositioning chest x-ray worse requiring increased  10/16 through 10/19>> continue attempts at diuresis.  Tolerating weaning peep/fiO2. neuromuscular blockade discontinued 17th.  Ongoing aggressive diuresis resulting in decreasing PEEP and FiO2 requirements and improving serial chest x-rays.  From the 17th to the 19th she went from almost 12 L positive down to just 4 L positive. 10/20: PEEP and FiO2 stuck at 10 and 40 respectively.  Spiking fever, white blood cell count up to 17, vancomycin and cefepime added for nosocomial coverage diuresis continued  Consults:     Procedures (surgical and bedside):  ETT 10/12 >> CVC 10/12 >> 10/21 PICC 10/21 >>  Significant Diagnostic Tests:  CXR 10/12 > 1. Endotracheal tube tip in the proximal right  mainstem bronchus. Retraction by 4 cm recommended. 2. Nasogastric tube side port below the diaphragm but not clearly visualized. Dedicated abdominal radiograph may be helpful. 3. Large area of right parahilar and medial right lung apex consolidation which may indicate infection or neoplastic process. Chest CT or Followup PA and lateral chest X-ray is recommended in 3-4 weeks following trial of antibiotic therapy to ensure resolution and exclude underlying malignancy. CT Head/C-Spine 10/12 > 1. No acute intracranial abnormality.2. No acute fracture of the cervical spine. 3. Large area of right upper lobe consolidation, possibly secondary to aspiration. TTE 11/12 >> EF 60 to 65%, normal RV, estimated PASP 44 mmHg  Micro Data:  Blood 10/12 >> U/A 10/12 >> negative Resp 10/12>>Normal flora Urine culture 10/17 >>> negative Sputum culture 10/17:>>> Few GPC few yeast>>> Repeat sputum culture 10/20>>> GPC pairs, GNR  Antimicrobials:  Zosyn 10/12 >> 10/15 Rocephin 10/15>>> 10/20 Vanco 10/13 >> 10/14 Cefepime 10/20 Vancomycin 10/20 > 10/22  Subjective:  Concern for suctioning tube feedings from ETT overnight, tube feedings held Some agitation, seroquel increased   Objective   Blood pressure 97/63, pulse 86, temperature 100 F (37.8 C), resp. rate (!) 32, height 5\' 2"  (1.575 m), weight 55.8 kg, SpO2 93 %. CVP:  [12 mmHg-20 mmHg] 20 mmHg  Vent Mode: PRVC FiO2 (%):  [40 %] 40 % Set Rate:  [32 bmp] 32 bmp Vt Set:  [400 mL] 400 mL PEEP:  [10 cmH20] 10 cmH20 Plateau Pressure:  [22 cmH20-24 cmH20] 24 cmH20   Intake/Output Summary (Last 24 hours) at 06/02/2018 1017 Last data filed at 06/02/2018 0900 Gross per 24 hour  Intake 2109.99 ml  Output 2825 ml  Net -715.01 ml   Filed Weights   05/31/18  0401 06/01/18 0112 06/02/18 0259  Weight: 56.9 kg 54.8 kg 55.8 kg    Examination:  General:  In bed on vent HENT: NCAT ETT in place PULM: Crackles bases B, vent supported  breathing CV: RRR, no mgr GI: BS+, soft, nontender MSK: normal bulk and tone Neuro: sedated on vent   Resolved Hospital Problem list   Septic shock 10/15  Assessment & Plan:  ARDS: oxygenation slightly better, mildly alkalotic, improving dead space > decrease PEEP 8, RR 25 > hopeful for SBT on 10/23 > VAP prevention > continue full fent support > goal paO2 55-65 > continue lasix  Belly distension/constipation > start miralax, dulcolax and senna  HCAP:  > stop vanc > set stop date for cefepime for 5 days  PEA arrest:  > continue current managment  Acute toxic metabolic encephalopathy/drug tachyphylaxis on 10/22 > change versed/fentanyl to dilaudid/ativan > RASS goal -3 > monitor QTc check 12 lead > continue clonazepam > continue seroquel  Disposition / Summary of Today's Plan 06/02/18   Read above     Diet: tube feeding Pain/Anxiety/Delirium protocol (if indicated):dilaudid, ativan, precedex VAP protocol (if indicated): yes DVT prophylaxis: sub q hep GI prophylaxis: famotidine Hyperglycemia protocol: monitor glucose Mobility: hold PT consult until oxygenation improves Code Status: full Family Communication: updated husband at length on 10/22  Labs   CBC: Recent Labs  Lab 05/28/18 0426 05/29/18 0502 05/30/18 0359 05/31/18 0406 06/01/18 0349  WBC 11.4* 13.9* 13.9* 17.2* 20.8*  HGB 7.4* 8.2* 7.8* 8.5* 7.9*  HCT 21.9* 24.8* 24.1* 25.6* 22.8*  MCV 73.7* 74.7* 74.6* 74.0* 73.5*  PLT 183 247 299 468* 529*    Basic Metabolic Panel: Recent Labs  Lab 05/27/18 0450 05/27/18 0828 05/28/18 0426 05/29/18 0502 05/30/18 0515 05/30/18 1023 05/31/18 0406 06/01/18 0349 06/02/18 0436  NA 142  --  141 142 141 142 141 140 140  K 3.8  --  3.0* 3.8 6.2* 4.5 4.3 3.7 3.2*  CL 102  --  103 100 99 99 97* 102 101  CO2 33*  --  33* 33* 35* 34* 36* 34* 31  GLUCOSE 111*  --  100* 104* 103* 120* 117* 119* 106*  BUN 23  --  24* 25* 32* 32* 35* 35* 31*  CREATININE  0.62  --  0.40* 0.67 0.68 0.69 0.63 0.52 0.49  CALCIUM 8.6*  --  8.1* 8.6* 8.5* 8.6* 8.9 8.3* 8.3*  MG 1.6* 2.1 1.8 2.0 2.4  --   --  2.4  --   PHOS 1.9*  --  2.6 4.4 5.0*  --   --  3.3  --    GFR: Estimated Creatinine Clearance: 55.4 mL/min (by C-G formula based on SCr of 0.49 mg/dL). Recent Labs  Lab 05/28/18 1300 05/29/18 0502 05/30/18 0359 05/31/18 0406 06/01/18 0349  PROCALCITON 2.22  --   --   --   --   WBC  --  13.9* 13.9* 17.2* 20.8*    Liver Function Tests: Recent Labs  Lab 05/27/18 0450 05/29/18 0502 06/01/18 0349  AST 31 28 40  ALT 19 23 40  ALKPHOS 80 105 119  BILITOT 0.3 0.5 0.5  PROT 4.8* 4.8* 5.1*  ALBUMIN 1.6* 1.5* 1.5*   No results for input(s): LIPASE, AMYLASE in the last 168 hours. No results for input(s): AMMONIA in the last 168 hours.  ABG    Component Value Date/Time   PHART 7.463 (H) 06/02/2018 0654   PCO2ART 48.0 06/02/2018 0654   PO2ART 75.0 (L)  06/02/2018 0654   HCO3 34.3 (H) 06/02/2018 0654   TCO2 36 (H) 06/02/2018 0654   ACIDBASEDEF 1.0 05/24/2018 1130   O2SAT 95.0 06/02/2018 0654     Coagulation Profile: No results for input(s): INR, PROTIME in the last 168 hours.  Cardiac Enzymes: No results for input(s): CKTOTAL, CKMB, CKMBINDEX, TROPONINI in the last 168 hours.  HbA1C: No results found for: HGBA1C  CBG: Recent Labs  Lab 06/01/18 1517 06/01/18 1951 06/01/18 2333 06/02/18 0408 06/02/18 0837  GLUCAP 113* 98 100* 102* 88   My cc time 32 minutes  Heber Floris, MD [facility] Pager: [phone] Cell: [phone] After 3pm or if no response, call [phone]

## 2018-06-03 ENCOUNTER — Inpatient Hospital Stay (HOSPITAL_COMMUNITY): Payer: Medicare HMO

## 2018-06-03 LAB — BASIC METABOLIC PANEL
ANION GAP: 9 (ref 5–15)
BUN: 35 mg/dL — AB (ref 8–23)
CHLORIDE: 104 mmol/L (ref 98–111)
CO2: 30 mmol/L (ref 22–32)
Calcium: 8.6 mg/dL — ABNORMAL LOW (ref 8.9–10.3)
Creatinine, Ser: 0.69 mg/dL (ref 0.44–1.00)
GFR calc Af Amer: >60 mL/min (ref >60)
GFR calc non Af Amer: >60 mL/min (ref >60)
GLUCOSE: 132 mg/dL — AB (ref 70–99)
POTASSIUM: 3.9 mmol/L (ref 3.5–5.1)
Sodium: 143 mmol/L (ref 135–145)

## 2018-06-03 LAB — GLUCOSE, CAPILLARY
GLUCOSE-CAPILLARY: 106 mg/dL — AB (ref 70–99)
GLUCOSE-CAPILLARY: 113 mg/dL — AB (ref 70–99)
GLUCOSE-CAPILLARY: 124 mg/dL — AB (ref 70–99)
GLUCOSE-CAPILLARY: 90 mg/dL (ref 70–99)
GLUCOSE-CAPILLARY: 126 mg/dL — AB (ref 70–99)
Glucose-Capillary: 115 mg/dL — ABNORMAL HIGH (ref 70–99)

## 2018-06-03 LAB — CBC WITH DIFFERENTIAL/PLATELET
ABS IMMATURE GRANULOCYTES: 0.72 10*3/uL — AB (ref 0.00–0.07)
BASOS PCT: 0 %
Basophils Absolute: 0.1 10*3/uL (ref 0.0–0.1)
Eosinophils Absolute: 0.2 10*3/uL (ref 0.0–0.5)
Eosinophils Relative: 1 %
HCT: 22.9 % — ABNORMAL LOW (ref 36.0–46.0)
HEMOGLOBIN: 7.3 g/dL — AB (ref 12.0–15.0)
IMMATURE GRANULOCYTES: 4 %
LYMPHS ABS: 1.6 10*3/uL (ref 0.7–4.0)
LYMPHS PCT: 8 %
MCH: 24.9 pg — ABNORMAL LOW (ref 26.0–34.0)
MCHC: 31.9 g/dL (ref 30.0–36.0)
MCV: 78.2 fL — ABNORMAL LOW (ref 80.0–100.0)
MONOS PCT: 4 %
Monocytes Absolute: 0.9 10*3/uL (ref 0.1–1.0)
NEUTROS ABS: 15.8 10*3/uL — AB (ref 1.7–7.7)
NEUTROS PCT: 83 %
Platelets: 750 10*3/uL — ABNORMAL HIGH (ref 150–400)
RBC: 2.93 MIL/uL — ABNORMAL LOW (ref 3.87–5.11)
RDW: 16.7 % — ABNORMAL HIGH (ref 11.5–15.5)
WBC: 19.2 10*3/uL — ABNORMAL HIGH (ref 4.0–10.5)
nRBC: 0 % (ref 0.0–0.2)

## 2018-06-03 NOTE — Progress Notes (Signed)
NAME:  Courtney Beard, MRN:  [id number], DOB:  1951/05/26, LOS: 11 ADMISSION DATE:  05/23/2018, CONSULTATION DATE:  05/23/2018 REFERRING MD:  Blinda Leatherwood, CHIEF COMPLAINT:  Overdose   Brief History   67 y/o female admitted post cardiac arrest in setting of presumed intentional overdose with benzodiazepine and muscle relaxants.  Bystander CPR initiated, has aspiration pneumonia and ARDS.   Past Medical History  Anxiety, depression, EtOH  Significant Hospital Events   10-12: Presents to ED, mechanically ventilated, central access placed, placed on vasoactive drips, antibiotics initiated 10/13: Placed on neuromuscular blockade given escalating PEEP/FiO2 requirements.  Norepinephrine needs worsened.  Placed on bicarbonate infusion 10/14: Still on high FiO2 and PEEP.  Left a little better when reviewing chest x-ray but right side still with dense consolidation.  Still on high pressor requirements.  Hyperchloremia and acidosis improved.  Stopping bicarbonate infusion.  Adding hypertonic saline neb as well as MetaNeb 10/15: She de-recruited during MetaNeb attempts so this was discontinued.  Shock resolved, BP elevated endotracheal tube at [place] required repositioning chest x-ray worse requiring increased  10/16 through 10/19>> continue attempts at diuresis.  Tolerating weaning peep/fiO2. neuromuscular blockade discontinued 17th.  Ongoing aggressive diuresis resulting in decreasing PEEP and FiO2 requirements and improving serial chest x-rays.  From the 17th to the 19th she went from almost 12 L positive down to just 4 L positive. 10/20: PEEP and FiO2 stuck at 10 and 40 respectively.  Spiking fever, white blood cell count up to 17, vancomycin and cefepime added for nosocomial coverage diuresis continued  Consults:     Procedures (surgical and bedside):  ETT 10/12 >> CVC 10/12 >> 10/21 PICC 10/21 >>  Significant Diagnostic Tests:  CXR 10/12 > 1. Endotracheal tube tip in the proximal right  mainstem bronchus. Retraction by 4 cm recommended. 2. Nasogastric tube side port below the diaphragm but not clearly visualized. Dedicated abdominal radiograph may be helpful. 3. Large area of right parahilar and medial right lung apex consolidation which may indicate infection or neoplastic process. Chest CT or Followup PA and lateral chest X-ray is recommended in 3-4 weeks following trial of antibiotic therapy to ensure resolution and exclude underlying malignancy. CT Head/C-Spine 10/12 > 1. No acute intracranial abnormality.2. No acute fracture of the cervical spine. 3. Large area of right upper lobe consolidation, possibly secondary to aspiration. TTE 11/12 >> EF 60 to 65%, normal RV, estimated PASP 44 mmHg  Micro Data:  Blood 10/12 >> U/A 10/12 >> negative Resp 10/12>>Normal flora Urine culture 10/17 >>> negative Sputum culture 10/17:>>> Few GPC few yeast>>> Repeat sputum culture 10/20>>> GPC pairs, GNR  Antimicrobials:  Zosyn 10/12 >> 10/15 Rocephin 10/15>>> 10/20 Vanco 10/13 >> 10/14 Cefepime 10/20 Vancomycin 10/20 > 10/22  Subjective:  Sedation is decreased Tolerated spontaneous breathing trial   Objective   Blood pressure (!) 87/51, pulse 89, temperature 99.7 F (37.6 C), resp. rate (!) 25, height 5\' 2"  (1.575 m), weight 57.6 kg, SpO2 93 %.    Vent Mode: PRVC FiO2 (%):  [40 %] 40 % Set Rate:  [25 bmp] 25 bmp Vt Set:  [400 mL] 400 mL PEEP:  [4 cmH20-8 cmH20] 5 cmH20 Plateau Pressure:  [11 cmH20-23 cmH20] 11 cmH20   Intake/Output Summary (Last 24 hours) at 06/03/2018 0937 Last data filed at 06/03/2018 0800 Gross per 24 hour  Intake 1039.39 ml  Output 2350 ml  Net -1310.61 ml   Filed Weights   06/01/18 0112 06/02/18 0259 06/03/18 0500  Weight: 54.8 kg 55.8 kg  57.6 kg    Examination:  General: Frail female who is on moderate sedation opens eyes but does not follow commands HEENT: Tracheal tube in place orogastric tube in place with tube feedings Neuro:  Opens eyes withdrawals CV: s1s2 rrr, no m/r/g, QTC on 1022 is 450 PULM: even/non-labored, lungs bilaterally rhonchi GI: Soft, positive bowel sounds, rectocele in place with some liquid drainage Extremities: warm/dry, negative edema  Skin: no rashes or lesions    Resolved Hospital Problem list   Septic shock 10/15  Assessment & Plan:  ARDS: oxygenation slightly better, mildly alkalotic, improving dead space.  Tolerating pressure support ventilation. Currently on pressure support 10 5 of PEEP 40% FiO2 moving tidal volumes of 610 cc with a rate of 18.  Continue to wean as tolerated Continue spontaneous breathing trial She may be weanable Continue negative I&O as tolerated  Belly distension/constipation Bowel regimen Discontinue Flexi-Seal  HCAP:  Vancomycin discontinued 06/02/2018 Continue cefepime for 4 more days  PEA arrest:  Continue current treatment  Acute toxic metabolic encephalopathy/drug tachyphylaxis  Continue Dilaudid Ativan and Precedex and wean as tolerated RA SS goal 0 to -1  Disposition / Summary of Today's Plan 06/03/18   Continue wean sedation as tolerated Continue spontaneous breathing trial as tolerated We may need to wean and extubate on sedation. Continue antibiotics for presumed right lower lobe pneumonia     Diet: tube feeding Pain/Anxiety/Delirium protocol (if indicated):dilaudid, ativan, precedex VAP protocol (if indicated): yes DVT prophylaxis: sub q hep GI prophylaxis: famotidine Hyperglycemia protocol: monitor glucose Mobility: hold PT consult until oxygenation improves Code Status: full Family Communication: 06/03/2018 is been updated at bedside.  Labs   CBC: Recent Labs  Lab 05/29/18 0502 05/30/18 0359 05/31/18 0406 06/01/18 0349 06/03/18 0443  WBC 13.9* 13.9* 17.2* 20.8* 19.2*  NEUTROABS  --   --   --   --  15.8*  HGB 8.2* 7.8* 8.5* 7.9* 7.3*  HCT 24.8* 24.1* 25.6* 22.8* 22.9*  MCV 74.7* 74.6* 74.0* 73.5* 78.2*  PLT 247 299  468* 529* 750*    Basic Metabolic Panel: Recent Labs  Lab 05/28/18 0426 05/29/18 0502 05/30/18 0515 05/30/18 1023 05/31/18 0406 06/01/18 0349 06/02/18 0436 06/03/18 0443  NA 141 142 141 142 141 140 140 143  K 3.0* 3.8 6.2* 4.5 4.3 3.7 3.2* 3.9  CL 103 100 99 99 97* 102 101 104  CO2 33* 33* 35* 34* 36* 34* 31 30  GLUCOSE 100* 104* 103* 120* 117* 119* 106* 132*  BUN 24* 25* 32* 32* 35* 35* 31* 35*  CREATININE 0.40* 0.67 0.68 0.69 0.63 0.52 0.49 0.69  CALCIUM 8.1* 8.6* 8.5* 8.6* 8.9 8.3* 8.3* 8.6*  MG 1.8 2.0 2.4  --   --  2.4  --   --   PHOS 2.6 4.4 5.0*  --   --  3.3  --   --    GFR: Estimated Creatinine Clearance: 55.4 mL/min (by C-G formula based on SCr of 0.69 mg/dL). Recent Labs  Lab 05/28/18 1300  05/30/18 0359 05/31/18 0406 06/01/18 0349 06/03/18 0443  PROCALCITON 2.22  --   --   --   --   --   WBC  --    < > 13.9* 17.2* 20.8* 19.2*   < > = values in this interval not displayed.    Liver Function Tests: Recent Labs  Lab 05/29/18 0502 06/01/18 0349  AST 28 40  ALT 23 40  ALKPHOS 105 119  BILITOT 0.5 0.5  PROT 4.8*  5.1*  ALBUMIN 1.5* 1.5*   No results for input(s): LIPASE, AMYLASE in the last 168 hours. No results for input(s): AMMONIA in the last 168 hours.  ABG    Component Value Date/Time   PHART 7.463 (H) 06/02/2018 0654   PCO2ART 48.0 06/02/2018 0654   PO2ART 75.0 (L) 06/02/2018 0654   HCO3 34.3 (H) 06/02/2018 0654   TCO2 36 (H) 06/02/2018 0654   ACIDBASEDEF 1.0 05/24/2018 1130   O2SAT 95.0 06/02/2018 0654     Coagulation Profile: No results for input(s): INR, PROTIME in the last 168 hours.  Cardiac Enzymes: No results for input(s): CKTOTAL, CKMB, CKMBINDEX, TROPONINI in the last 168 hours.  HbA1C: No results found for: HGBA1C  CBG: Recent Labs  Lab 06/02/18 1528 06/02/18 1942 06/02/18 2341 06/03/18 0325 06/03/18 0749  GLUCAP 93 99 127* 106* 113*   App cct 30 min  Brett Canales Malik Paar ACNP [facility] Pager [phone] till 1  pm If no answer page [phone] 06/03/2018, 9:37 AM

## 2018-06-03 NOTE — Progress Notes (Addendum)
Pharmacy Antibiotic Note  Courtney Beard is a 67 y.o. female with concern for HCAP. Pharmacy has been consulted for cefepime dosing.   Currently, patient is afebrile, but temperature did peak to 102F overnight. Renal function is stable and WBC is down to 19.2.  Plan: Continue cefepime 2 g IV q12h Monitor renal fx, cx, LOT, fever curve   Height: 5\' 2"  (157.5 cm) Weight: 126 lb 15.8 oz (57.6 kg) IBW/kg (Calculated) : 50.1  Temp (24hrs), Avg:99.8 F (37.7 C), Min:97.9 F (36.6 C), Max:102 F (38.9 C)  Recent Labs  Lab 05/29/18 0502 05/30/18 0359  05/30/18 1023 05/31/18 0406 06/01/18 0349 06/02/18 0436 06/03/18 0443  WBC 13.9* 13.9*  --   --  17.2* 20.8*  --  19.2*  CREATININE 0.67  --    < > 0.69 0.63 0.52 0.49 0.69   < > = values in this interval not displayed.    Estimated Creatinine Clearance: 55.4 mL/min (by C-G formula based on SCr of 0.69 mg/dL).    Microbiology Results  10/12 Bcx: NGF 10/12 TA cx: normal flora  10/12 MRSA PCR: neg 10/17: TA Cx: few candida albicans 10/20 TA cx: few candida albicans   Antibiotics This Admission Zosyn 10/12 >> 10/15 Vanc 10/13 >> 10/14; 10/20 >> 10/22 Ceftriaxone 10/15 >> 10/20 Cefepime 10/20 >>    Chauncey Mann, Pharmacy Student   Chelsea Aus D. Laney Potash, PharmD, BCPS, BCCCP 06/03/2018, 11:50 AM

## 2018-06-04 ENCOUNTER — Encounter (HOSPITAL_COMMUNITY): Payer: Self-pay

## 2018-06-04 ENCOUNTER — Other Ambulatory Visit: Payer: Self-pay

## 2018-06-04 ENCOUNTER — Inpatient Hospital Stay (HOSPITAL_COMMUNITY): Payer: Medicare HMO

## 2018-06-04 LAB — CBC WITH DIFFERENTIAL/PLATELET
Abs Immature Granulocytes: 0.48 10*3/uL — ABNORMAL HIGH (ref 0.00–0.07)
BASOS ABS: 0.1 10*3/uL (ref 0.0–0.1)
Basophils Relative: 0 %
EOS PCT: 1 %
Eosinophils Absolute: 0.3 10*3/uL (ref 0.0–0.5)
HEMATOCRIT: 22.8 % — AB (ref 36.0–46.0)
Hemoglobin: 7.2 g/dL — ABNORMAL LOW (ref 12.0–15.0)
Immature Granulocytes: 2 %
LYMPHS ABS: 1.7 10*3/uL (ref 0.7–4.0)
Lymphocytes Relative: 7 %
MCH: 25 pg — AB (ref 26.0–34.0)
MCHC: 31.6 g/dL (ref 30.0–36.0)
MCV: 79.2 fL — ABNORMAL LOW (ref 80.0–100.0)
MONO ABS: 1 10*3/uL (ref 0.1–1.0)
Monocytes Relative: 4 %
NRBC: 0 % (ref 0.0–0.2)
Neutro Abs: 19.2 10*3/uL — ABNORMAL HIGH (ref 1.7–7.7)
Neutrophils Relative %: 86 %
Platelets: 798 10*3/uL — ABNORMAL HIGH (ref 150–400)
RBC: 2.88 MIL/uL — ABNORMAL LOW (ref 3.87–5.11)
RDW: 16.3 % — AB (ref 11.5–15.5)
WBC: 22.7 10*3/uL — ABNORMAL HIGH (ref 4.0–10.5)

## 2018-06-04 LAB — BASIC METABOLIC PANEL
Anion gap: 6 (ref 5–15)
BUN: 30 mg/dL — AB (ref 8–23)
CALCIUM: 8.7 mg/dL — AB (ref 8.9–10.3)
CO2: 33 mmol/L — AB (ref 22–32)
CREATININE: 0.59 mg/dL (ref 0.44–1.00)
Chloride: 106 mmol/L (ref 98–111)
GFR calc non Af Amer: >60 mL/min (ref >60)
Glucose, Bld: 111 mg/dL — ABNORMAL HIGH (ref 70–99)
Potassium: 4.3 mmol/L (ref 3.5–5.1)
Sodium: 145 mmol/L (ref 135–145)

## 2018-06-04 LAB — GLUCOSE, CAPILLARY
GLUCOSE-CAPILLARY: 110 mg/dL — AB (ref 70–99)
GLUCOSE-CAPILLARY: 136 mg/dL — AB (ref 70–99)
Glucose-Capillary: 117 mg/dL — ABNORMAL HIGH (ref 70–99)
Glucose-Capillary: 124 mg/dL — ABNORMAL HIGH (ref 70–99)
Glucose-Capillary: 128 mg/dL — ABNORMAL HIGH (ref 70–99)

## 2018-06-04 LAB — PHOSPHORUS: PHOSPHORUS: 3.8 mg/dL (ref 2.5–4.6)

## 2018-06-04 LAB — MAGNESIUM: Magnesium: 2.3 mg/dL (ref 1.7–2.4)

## 2018-06-04 MED ORDER — LORAZEPAM 2 MG/ML IJ SOLN
0.5000 mg | Freq: Two times a day (BID) | INTRAMUSCULAR | Status: DC | PRN
  Administered 2018-06-04 – 2018-06-07 (×4): 1 mg via INTRAVENOUS
  Administered 2018-06-08: 0.5 mg via INTRAVENOUS
  Administered 2018-06-09 – 2018-06-12 (×3): 1 mg via INTRAVENOUS
  Administered 2018-06-13: 0.5 mg via INTRAVENOUS
  Administered 2018-06-13 – 2018-06-15 (×2): 1 mg via INTRAVENOUS
  Filled 2018-06-04 (×12): qty 1

## 2018-06-04 MED ORDER — HYDROMORPHONE HCL 1 MG/ML IJ SOLN
1.0000 mg | INTRAMUSCULAR | Status: DC | PRN
  Administered 2018-06-04 – 2018-06-10 (×25): 2 mg via INTRAVENOUS
  Filled 2018-06-04 (×13): qty 2
  Filled 2018-06-04: qty 1
  Filled 2018-06-04 (×12): qty 2

## 2018-06-04 NOTE — Progress Notes (Signed)
NAME:  Courtney Beard, MRN:  [id number], DOB:  [birth year], LOS: 12 ADMISSION DATE:  05/23/2018, CONSULTATION DATE:  05/23/2018 REFERRING MD:  Blinda Leatherwood, CHIEF COMPLAINT:  Overdose   Brief History   67 y/o female admitted post cardiac arrest in setting of presumed intentional overdose with benzodiazepine and muscle relaxants.  Bystander CPR initiated, has aspiration pneumonia and ARDS.   Past Medical History  Anxiety, depression, EtOH  Significant Hospital Events   10-12: Presents to ED, mechanically ventilated, central access placed, placed on vasoactive drips, antibiotics initiated 10/13: Placed on neuromuscular blockade given escalating PEEP/FiO2 requirements.  Norepinephrine needs worsened.  Placed on bicarbonate infusion 10/14: Still on high FiO2 and PEEP.  Left a little better when reviewing chest x-ray but right side still with dense consolidation.  Still on high pressor requirements.  Hyperchloremia and acidosis improved.  Stopping bicarbonate infusion.  Adding hypertonic saline neb as well as MetaNeb 10/15: She de-recruited during MetaNeb attempts so this was discontinued.  Shock resolved, BP elevated endotracheal tube at [place] required repositioning chest x-ray worse requiring increased  10/16 through 10/19>> continue attempts at diuresis.  Tolerating weaning peep/fiO2. neuromuscular blockade discontinued 17th.  Ongoing aggressive diuresis resulting in decreasing PEEP and FiO2 requirements and improving serial chest x-rays.  From the 17th to the 19th she went from almost 12 L positive down to just 4 L positive. 10/20: PEEP and FiO2 stuck at 10 and 40 respectively.  Spiking fever, white blood cell count up to 17, vancomycin and cefepime added for nosocomial coverage diuresis continued  Consults:     Procedures (surgical and bedside):  ETT 10/12 >> CVC 10/12 >> 10/21 PICC 10/21 >>  Significant Diagnostic Tests:  CXR 10/12 > 1. Endotracheal tube tip in the proximal right  mainstem bronchus. Retraction by 4 cm recommended. 2. Nasogastric tube side port below the diaphragm but not clearly visualized. Dedicated abdominal radiograph may be helpful. 3. Large area of right parahilar and medial right lung apex consolidation which may indicate infection or neoplastic process. Chest CT or Followup PA and lateral chest X-ray is recommended in 3-4 weeks following trial of antibiotic therapy to ensure resolution and exclude underlying malignancy. CT Head/C-Spine 10/12 > 1. No acute intracranial abnormality.2. No acute fracture of the cervical spine. 3. Large area of right upper lobe consolidation, possibly secondary to aspiration. TTE 11/12 >> EF 60 to 65%, normal RV, estimated PASP 44 mmHg  Micro Data:  Blood 10/12 >> U/A 10/12 >> negative Resp 10/12>>Normal flora Urine culture 10/17 >>> negative Sputum culture 10/17:>>> Few GPC few yeast>>> Repeat sputum culture 10/20>>> GPC pairs, GNR 06/04/2018 sputum culture>> 06/04/2018 blood cultures x2>>>   Antimicrobials:  Zosyn 10/12 >> 10/15 Rocephin 10/15>>> 10/20 Vanco 10/13 >> 10/14 Cefepime 10/20>> Vancomycin 10/20 > 10/22  Subjective:  Currently on pressure support ventilation Epley sedated   Objective   Blood pressure (!) 102/58, pulse 98, temperature 98.2 F (36.8 C), resp. rate 10, height 5\' 2"  (1.575 m), weight 58.6 kg, SpO2 98 %. CVP:  [19 mmHg] 19 mmHg  Vent Mode: PRVC FiO2 (%):  [40 %] 40 % Set Rate:  [25 bmp] 25 bmp Vt Set:  [400 mL] 400 mL PEEP:  [5 cmH20] 5 cmH20 Pressure Support:  [10 cmH20] 10 cmH20 Plateau Pressure:  [14 cmH20-21 cmH20] 21 cmH20   Intake/Output Summary (Last 24 hours) at 06/04/2018 1104 Last data filed at 06/04/2018 0800 Gross per 24 hour  Intake 1298.79 ml  Output 2970 ml  Net -1671.21 ml  Filed Weights   06/02/18 0259 06/03/18 0500 06/04/18 0500  Weight: 55.8 kg 57.6 kg 58.6 kg    Examination:  General: Frail female who is heavily sedated at time of  examination HEENT: Endotracheal tube in place Neuro: Fully sedated CV: s1s2 rrr, no m/r/g PULM: even/non-labored, lungs bilaterally creased to base RU:EAVW, non-tender, bsx4 active  Extremities: warm/dry, negative edema  Skin: no rashes or lesions     Resolved Hospital Problem list   Septic shock 10/15  Assessment & Plan:  ARDS: oxygenation slightly better, mildly alkalotic, improving dead space.  Tolerating pressure support ventilation. Tolerating pressure support of 8 Wean as tolerated Change sedation medications from continuous drips to IV as needed  Belly distension/constipation Continue bowel regimen  HCAP:  Vancomycin was DC'd on 1022 Spiked fever of 103 06/03/2018 Pancultured 06/04/2018 but did not resume vancomycin at this time as her temperature is 97  PEA arrest:  Continue current treatment  Acute toxic metabolic encephalopathy/drug tachyphylaxis  Wean Dilaudid Ativan and Precedex as able RA SS score of 0 to -1  Disposition / Summary of Today's Plan 06/04/18   Continue incentive sedation Attempt spontaneous breathing trial wean as tolerated Note spiked a fever 103 during the night plan to panculture 06/04/2018 but will not add vancomycin at this time.  Should she spike a fever we will pull the trigger on vancomycin.     Diet: tube feeding Pain/Anxiety/Delirium protocol (if indicated):dilaudid, ativan, precedex VAP protocol (if indicated): yes DVT prophylaxis: sub q hep GI prophylaxis: famotidine Hyperglycemia protocol: monitor glucose Mobility: hold PT consult until oxygenation improves Code Status: full Family Communication: 06/04/2018 husband updated at bedside  Labs   CBC: Recent Labs  Lab 05/30/18 0359 05/31/18 0406 06/01/18 0349 06/03/18 0443 06/04/18 0342  WBC 13.9* 17.2* 20.8* 19.2* 22.7*  NEUTROABS  --   --   --  15.8* 19.2*  HGB 7.8* 8.5* 7.9* 7.3* 7.2*  HCT 24.1* 25.6* 22.8* 22.9* 22.8*  MCV 74.6* 74.0* 73.5* 78.2* 79.2*  PLT 299  468* 529* 750* 798*    Basic Metabolic Panel: Recent Labs  Lab 05/29/18 0502 05/30/18 0515  05/31/18 0406 06/01/18 0349 06/02/18 0436 06/03/18 0443 06/04/18 0342  NA 142 141   < > 141 140 140 143 145  K 3.8 6.2*   < > 4.3 3.7 3.2* 3.9 4.3  CL 100 99   < > 97* 102 101 104 106  CO2 33* 35*   < > 36* 34* 31 30 33*  GLUCOSE 104* 103*   < > 117* 119* 106* 132* 111*  BUN 25* 32*   < > 35* 35* 31* 35* 30*  CREATININE 0.67 0.68   < > 0.63 0.52 0.49 0.69 0.59  CALCIUM 8.6* 8.5*   < > 8.9 8.3* 8.3* 8.6* 8.7*  MG 2.0 2.4  --   --  2.4  --   --  2.3  PHOS 4.4 5.0*  --   --  3.3  --   --  3.8   < > = values in this interval not displayed.   GFR: Estimated Creatinine Clearance: 55.4 mL/min (by C-G formula based on SCr of 0.59 mg/dL). Recent Labs  Lab 05/28/18 1300  05/31/18 0406 06/01/18 0349 06/03/18 0443 06/04/18 0342  PROCALCITON 2.22  --   --   --   --   --   WBC  --    < > 17.2* 20.8* 19.2* 22.7*   < > = values in this interval not displayed.  Liver Function Tests: Recent Labs  Lab 05/29/18 0502 06/01/18 0349  AST 28 40  ALT 23 40  ALKPHOS 105 119  BILITOT 0.5 0.5  PROT 4.8* 5.1*  ALBUMIN 1.5* 1.5*   No results for input(s): LIPASE, AMYLASE in the last 168 hours. No results for input(s): AMMONIA in the last 168 hours.  ABG    Component Value Date/Time   PHART 7.463 (H) 06/02/2018 0654   PCO2ART 48.0 06/02/2018 0654   PO2ART 75.0 (L) 06/02/2018 0654   HCO3 34.3 (H) 06/02/2018 0654   TCO2 36 (H) 06/02/2018 0654   ACIDBASEDEF 1.0 05/24/2018 1130   O2SAT 95.0 06/02/2018 0654     Coagulation Profile: No results for input(s): INR, PROTIME in the last 168 hours.  Cardiac Enzymes: No results for input(s): CKTOTAL, CKMB, CKMBINDEX, TROPONINI in the last 168 hours.  HbA1C: No results found for: HGBA1C  CBG: Recent Labs  Lab 06/03/18 1520 06/03/18 1932 06/03/18 2349 06/04/18 0329 06/04/18 0807  GLUCAP 90 126* 115* 110* 117*   App cct 30 min  Brett Canales  Andriea Hasegawa ACNP [facility] Pager [phone] till 1 pm If no answer page [phone] 06/04/2018, 11:04 AM

## 2018-06-04 NOTE — Progress Notes (Signed)
Nutrition Follow-up  DOCUMENTATION CODES:   Not applicable  INTERVENTION:   Tube Feeding:  Vital AF 1.2 @ 55 ml/hr Provide 99 g of protein, 1584 kcals, 1069 mL of free water Provides 96% calorie needs, 100% protein needs  NUTRITION DIAGNOSIS:   Inadequate oral intake related to inability to eat as evidenced by NPO status.  Being addressed via TF   GOAL:   Patient will meet greater than or equal to 90% of their needs  Met  MONITOR:   Labs, Weight trends, Vent status, I & O's, TF tolerance  REASON FOR ASSESSMENT:   Ventilator    ASSESSMENT:   67 y/o female PMHx anxiety, depression, etoh use. Presents after husband found pt on floor, covered in emesis w/ empty bottles of klonopin and zanaflex. Questionable cardiac arrest. Intubated on arrival to ED. Pt developed septic shock w/ CXR showing aspiration PNA.   Patient is currently intubated on ventilator support, on precedex only, not waking up, weaning MV: 9 L/min Temp (24hrs), Avg:99.2 F (37.3 C), Min:97.7 F (36.5 C), Max:102.9 F (39.4 C)  Vital AF 1.2 @ 55 ml/hr via OG tube  Utilizing 55 kg as EDW; net negative 2.8 L since admission, Current wt 58.6 kg  Labs: reviewed Meds: lasix, precedex, folic acid, lasix, KCl, thiamine  Diet Order:   Diet Order            Diet NPO time specified  Diet effective now              EDUCATION NEEDS:   No education needs have been identified at this time  Skin:  Skin Assessment: Skin Integrity Issues: Skin Integrity Issues:: Other (Comment) Other: MASD: sacrum  Last BM:  10/24  Height:   Ht Readings from Last 1 Encounters:  05/23/18 '5\' 2"'  (1.575 m)    Weight:   Wt Readings from Last 1 Encounters:  06/04/18 58.6 kg    Ideal Body Weight:  50 kg  BMI:  Body mass index is 23.63 kg/m.  Estimated Nutritional Needs:   Kcal:  1657 kcals   Protein:  85-105   Fluid:  per MD    Kerman Passey MS, RD, LDN, CNSC [phone] Pager  [phone]  Weekend/On-Call Pager

## 2018-06-05 ENCOUNTER — Inpatient Hospital Stay (HOSPITAL_COMMUNITY): Payer: Medicare HMO

## 2018-06-05 LAB — BASIC METABOLIC PANEL
Anion gap: 8 (ref 5–15)
BUN: 28 mg/dL — ABNORMAL HIGH (ref 8–23)
CALCIUM: 9.2 mg/dL (ref 8.9–10.3)
CO2: 31 mmol/L (ref 22–32)
Chloride: 105 mmol/L (ref 98–111)
Creatinine, Ser: 0.63 mg/dL (ref 0.44–1.00)
Glucose, Bld: 118 mg/dL — ABNORMAL HIGH (ref 70–99)
Potassium: 4.2 mmol/L (ref 3.5–5.1)
SODIUM: 144 mmol/L (ref 135–145)

## 2018-06-05 LAB — CBC WITH DIFFERENTIAL/PLATELET
Abs Immature Granulocytes: 0.48 10*3/uL — ABNORMAL HIGH (ref 0.00–0.07)
BASOS ABS: 0.1 10*3/uL (ref 0.0–0.1)
BASOS PCT: 0 %
EOS ABS: 0.3 10*3/uL (ref 0.0–0.5)
Eosinophils Relative: 1 %
HCT: 25.1 % — ABNORMAL LOW (ref 36.0–46.0)
Hemoglobin: 7.7 g/dL — ABNORMAL LOW (ref 12.0–15.0)
IMMATURE GRANULOCYTES: 2 %
LYMPHS ABS: 2.7 10*3/uL (ref 0.7–4.0)
Lymphocytes Relative: 10 %
MCH: 24.6 pg — ABNORMAL LOW (ref 26.0–34.0)
MCHC: 30.7 g/dL (ref 30.0–36.0)
MCV: 80.2 fL (ref 80.0–100.0)
Monocytes Absolute: 1.3 10*3/uL — ABNORMAL HIGH (ref 0.1–1.0)
Monocytes Relative: 5 %
NEUTROS PCT: 82 %
NRBC: 0 % (ref 0.0–0.2)
Neutro Abs: 23.1 10*3/uL — ABNORMAL HIGH (ref 1.7–7.7)
PLATELETS: 984 10*3/uL — AB (ref 150–400)
RBC: 3.13 MIL/uL — ABNORMAL LOW (ref 3.87–5.11)
RDW: 16.6 % — AB (ref 11.5–15.5)
WBC: 28 10*3/uL — ABNORMAL HIGH (ref 4.0–10.5)

## 2018-06-05 LAB — GLUCOSE, CAPILLARY
GLUCOSE-CAPILLARY: 126 mg/dL — AB (ref 70–99)
GLUCOSE-CAPILLARY: 135 mg/dL — AB (ref 70–99)
Glucose-Capillary: 108 mg/dL — ABNORMAL HIGH (ref 70–99)
Glucose-Capillary: 118 mg/dL — ABNORMAL HIGH (ref 70–99)
Glucose-Capillary: 128 mg/dL — ABNORMAL HIGH (ref 70–99)
Glucose-Capillary: 135 mg/dL — ABNORMAL HIGH (ref 70–99)

## 2018-06-05 LAB — MAGNESIUM: Magnesium: 2.4 mg/dL (ref 1.7–2.4)

## 2018-06-05 LAB — PHOSPHORUS: PHOSPHORUS: 3.7 mg/dL (ref 2.5–4.6)

## 2018-06-05 MED ORDER — DEXMEDETOMIDINE HCL IN NACL 400 MCG/100ML IV SOLN
0.0000 ug/kg/h | INTRAVENOUS | Status: DC
  Administered 2018-06-05 – 2018-06-06 (×3): 1.2 ug/kg/h via INTRAVENOUS
  Filled 2018-06-05 (×4): qty 100

## 2018-06-05 MED ORDER — CLONIDINE HCL 0.2 MG PO TABS
0.2000 mg | ORAL_TABLET | Freq: Four times a day (QID) | ORAL | Status: DC
  Administered 2018-06-05 – 2018-06-08 (×11): 0.2 mg
  Filled 2018-06-05 (×11): qty 1

## 2018-06-05 MED ORDER — ORAL CARE MOUTH RINSE: 15.0000 mL | OROMUCOSAL | Status: DC

## 2018-06-05 MED ORDER — CHLORHEXIDINE GLUCONATE 0.12% ORAL RINSE (MEDLINE KIT): 15.0000 mL | Freq: Two times a day (BID) | OROMUCOSAL | Status: DC

## 2018-06-05 MED ORDER — VANCOMYCIN HCL 500 MG IV SOLR
500.0000 mg | Freq: Two times a day (BID) | INTRAVENOUS | Status: DC
  Administered 2018-06-06 – 2018-06-07 (×3): 500 mg via INTRAVENOUS
  Filled 2018-06-05 (×4): qty 500

## 2018-06-05 MED ORDER — VANCOMYCIN HCL IN DEXTROSE 1-5 GM/200ML-% IV SOLN
1000.0000 mg | Freq: Once | INTRAVENOUS | Status: AC
  Administered 2018-06-05: 1000 mg via INTRAVENOUS
  Filled 2018-06-05: qty 200

## 2018-06-05 MED ORDER — SODIUM CHLORIDE 0.9 % IV SOLN
1.0000 g | Freq: Three times a day (TID) | INTRAVENOUS | Status: AC
  Administered 2018-06-05 – 2018-06-11 (×20): 1 g via INTRAVENOUS
  Filled 2018-06-05 (×20): qty 1

## 2018-06-05 NOTE — Progress Notes (Signed)
CRITICAL VALUE ALERT  Critical Value:  Platelets 984  Date & Time Notied:  10/25/219 0650   Provider Notified: Pola Corn  Orders Received/Actions taken: awaiting orders

## 2018-06-05 NOTE — Progress Notes (Signed)
90 mL of Dilaudid wasted in black box 40 mL of Ativan wasted in black box Witnessed by [employer]

## 2018-06-05 NOTE — Progress Notes (Signed)
NAME:  Courtney Beard, MRN:  [id number], DOB:  03-26-1951, LOS: 13 ADMISSION DATE:  05/23/2018, CONSULTATION DATE:  05/23/2018 REFERRING MD:  Blinda Leatherwood, CHIEF COMPLAINT:  Overdose   Brief History   67 y/o female admitted post cardiac arrest in setting of presumed intentional overdose with benzodiazepine and muscle relaxants.  Bystander CPR initiated, has aspiration pneumonia and ARDS.   Past Medical History  Anxiety, depression, EtOH  Significant Hospital Events   10-12: Presents to ED, mechanically ventilated, central access placed, placed on vasoactive drips, antibiotics initiated 10/13: Placed on neuromuscular blockade given escalating PEEP/FiO2 requirements.  Norepinephrine needs worsened.  Placed on bicarbonate infusion 10/14: Still on high FiO2 and PEEP.  Left a little better when reviewing chest x-ray but right side still with dense consolidation.  Still on high pressor requirements.  Hyperchloremia and acidosis improved.  Stopping bicarbonate infusion.  Adding hypertonic saline neb as well as MetaNeb 10/15: She de-recruited during MetaNeb attempts so this was discontinued.  Shock resolved, BP elevated endotracheal tube at carina required repositioning chest x-ray worse requiring increased  10/16 through 10/19>> continue attempts at diuresis.  Tolerating weaning peep/fiO2. neuromuscular blockade discontinued 17th.  Ongoing aggressive diuresis resulting in decreasing PEEP and FiO2 requirements and improving serial chest x-rays.  From the 17th to the 19th she went from almost 12 L positive down to just 4 L positive. 10/20: PEEP and FiO2 stuck at 10 and 40 respectively.  Spiking fever, white blood cell count up to 17, vancomycin and cefepime added for nosocomial coverage diuresis continued  Consults:     Procedures (surgical and bedside):  ETT 10/12 >> CVC 10/12 >> 10/21 PICC 10/21 >>  Significant Diagnostic Tests:  CXR 10/12 > 1. Endotracheal tube tip in the proximal right  mainstem bronchus. Retraction by 4 cm recommended. 2. Nasogastric tube side port below the diaphragm but not clearly visualized. Dedicated abdominal radiograph may be helpful. 3. Large area of right parahilar and medial right lung apex consolidation which may indicate infection or neoplastic process. Chest CT or Followup PA and lateral chest X-ray is recommended in 3-4 weeks following trial of antibiotic therapy to ensure resolution and exclude underlying malignancy. CT Head/C-Spine 10/12 > 1. No acute intracranial abnormality.2. No acute fracture of the cervical spine. 3. Large area of right upper lobe consolidation, possibly secondary to aspiration. TTE 11/12 >> EF 60 to 65%, normal RV, estimated PASP 44 mmHg  Micro Data:  Blood 10/12 >> U/A 10/12 >> negative Resp 10/12>>Normal flora Urine culture 10/17 >>> negative Sputum culture 10/17:>>> Few GPC few yeast>>> Repeat sputum culture 10/20>>> GPC pairs, GNR  Antimicrobials:  Zosyn 10/12 >> 10/15 Rocephin 10/15>>> 10/20 Vanco 10/13 >> 10/14 Cefepime 10/20 > 10/25 Vancomycin 10/20 > 10/22 Vanc 10/25 >  Mero 10/25 >   Subjective:  Fever Awake but not following commands  Objective   Blood pressure 140/71, pulse (!) 130, temperature (!) 97.2 F (36.2 C), temperature source Axillary, resp. rate (!) 30, height 5\' 2"  (1.575 m), weight 51.1 kg, SpO2 96 %. CVP:  [2 mmHg-6 mmHg] 6 mmHg  Vent Mode: CPAP;PSV FiO2 (%):  [40 %] 40 % Set Rate:  [25 bmp] 25 bmp Vt Set:  [400 mL] 400 mL PEEP:  [5 cmH20] 5 cmH20 Pressure Support:  [8 cmH20] 8 cmH20 Plateau Pressure:  [12 cmH20-16 cmH20] 13 cmH20   Intake/Output Summary (Last 24 hours) at 06/05/2018 1108 Last data filed at 06/05/2018 0700 Gross per 24 hour  Intake 1605.48 ml  Output 4135  ml  Net -2529.52 ml   Filed Weights   06/03/18 0500 06/04/18 0500 06/05/18 0500  Weight: 57.6 kg 58.6 kg 51.1 kg    Examination:  General:  In bed on vent HENT: NCAT ETT in place PULM: CTA  B, vent supported breathing CV: RRR, no mgr GI: BS+, soft, nontender MSK: normal bulk and tone Neuro: sedated on vent    Resolved Hospital Problem list   Septic shock 10/15  Assessment & Plan:  ARDS: oxygenation slightly better, mildly alkalotic, improving dead space > pressure support as long as tolerated > VAP prevention > diurese again   HCAP? I think fevers are due to ARDS but with rising WBC fill compelled to cover until repeat culture back > stop cefepime > add back vanc, mero  Fever, rising WBC with loose stool> C. Diff? > check c diff per protocol  Acute toxic metaboli encephalopathy/drug tachyphylaxis on 10/22; concern for precedex withdrawal > continue precedex > clonidine start today > conitnue prn dilaudid, ativan > continue seroquel   Disposition / Summary of Today's Plan 06/05/18   Read above     Diet: tube feeding Pain/Anxiety/Delirium protocol (if indicated):precedex, clonidine, ativan, dilaudid VAP protocol (if indicated): yes DVT prophylaxis: sub q hep GI prophylaxis: famotidine Hyperglycemia protocol: monitor glucose Mobility: hold PT consult until oxygenation improves Code Status: full Family Communication: updated husband at length on 10/22  Labs   CBC: Recent Labs  Lab 05/31/18 0406 06/01/18 0349 06/03/18 0443 06/04/18 0342 06/05/18 0530  WBC 17.2* 20.8* 19.2* 22.7* 28.0*  NEUTROABS  --   --  15.8* 19.2* 23.1*  HGB 8.5* 7.9* 7.3* 7.2* 7.7*  HCT 25.6* 22.8* 22.9* 22.8* 25.1*  MCV 74.0* 73.5* 78.2* 79.2* 80.2  PLT 468* 529* 750* 798* 984*    Basic Metabolic Panel: Recent Labs  Lab 05/30/18 0515  06/01/18 0349 06/02/18 0436 06/03/18 0443 06/04/18 0342 06/05/18 0530  NA 141   < > 140 140 143 145 144  K 6.2*   < > 3.7 3.2* 3.9 4.3 4.2  CL 99   < > 102 101 104 106 105  CO2 35*   < > 34* 31 30 33* 31  GLUCOSE 103*   < > 119* 106* 132* 111* 118*  BUN 32*   < > 35* 31* 35* 30* 28*  CREATININE 0.68   < > 0.52 0.49 0.69 0.59  0.63  CALCIUM 8.5*   < > 8.3* 8.3* 8.6* 8.7* 9.2  MG 2.4  --  2.4  --   --  2.3 2.4  PHOS 5.0*  --  3.3  --   --  3.8 3.7   < > = values in this interval not displayed.   GFR: Estimated Creatinine Clearance: 55.4 mL/min (by C-G formula based on SCr of 0.63 mg/dL). Recent Labs  Lab 06/01/18 0349 06/03/18 0443 06/04/18 0342 06/05/18 0530  WBC 20.8* 19.2* 22.7* 28.0*    Liver Function Tests: Recent Labs  Lab 06/01/18 0349  AST 40  ALT 40  ALKPHOS 119  BILITOT 0.5  PROT 5.1*  ALBUMIN 1.5*   No results for input(s): LIPASE, AMYLASE in the last 168 hours. No results for input(s): AMMONIA in the last 168 hours.  ABG    Component Value Date/Time   PHART 7.463 (H) 06/02/2018 0654   PCO2ART 48.0 06/02/2018 0654   PO2ART 75.0 (L) 06/02/2018 0654   HCO3 34.3 (H) 06/02/2018 0654   TCO2 36 (H) 06/02/2018 0654   ACIDBASEDEF 1.0 05/24/2018 1130  O2SAT 95.0 06/02/2018 0654     Coagulation Profile: No results for input(s): INR, PROTIME in the last 168 hours.  Cardiac Enzymes: No results for input(s): CKTOTAL, CKMB, CKMBINDEX, TROPONINI in the last 168 hours.  HbA1C: No results found for: HGBA1C  CBG: Recent Labs  Lab 06/04/18 1603 06/04/18 2001 06/05/18 0040 06/05/18 0449 06/05/18 0747  GLUCAP 136* 128* 128* 118* 108*   My cc time 32 minutes  Heber , MD [facility] Pager: [phone] Cell: [phone] After 3pm or if no response, call [phone]

## 2018-06-05 NOTE — Progress Notes (Addendum)
Pharmacy Antibiotic Note  Courtney Beard is a 67 y.o. female with concern for HCAP. Pharmacy has been consulted broaden antibiotics to vancomycin and meropenem.  Currently, patient is afebrile, but temperature did peak to 101.9 and WBC is up to 28. Renal function is stable.   Plan: Dc cefepime Initiate meropenem 1g IV q8h Initiate vancomycin 1000 mg IV once, then 500 mg IV q12h Monitor renal fx, cx, vt prn, and fever curve  Height: 5\' 2"  (157.5 cm) Weight: 112 lb 10.5 oz (51.1 kg) IBW/kg (Calculated) : 50.1  Temp (24hrs), Avg:100.2 F (37.9 C), Min:97.2 F (36.2 C), Max:102.4 F (39.1 C)  Recent Labs  Lab 05/31/18 0406 06/01/18 0349 06/02/18 0436 06/03/18 0443 06/04/18 0342 06/05/18 0530  WBC 17.2* 20.8*  --  19.2* 22.7* 28.0*  CREATININE 0.63 0.52 0.49 0.69 0.59 0.63    Estimated Creatinine Clearance: 55.4 mL/min (by C-G formula based on SCr of 0.63 mg/dL).     Microbiology Results 10/12 Bcx: NegF 10/12 TA cx: normal flora  10/12 MRSA PCR: neg 10/17: TA Cx: few candida albicans 10/20 TA cx: few candida albicans 10/24 Ta: 10/24 Bcx:   Antibiotics This Admission   Zosyn 10/12 >> 10/15 Vanc 10/13 >> 10/14; 10/20 >> 10/22; 10/25>> Ceftriaxone 10/15 >> 10/20 Cefepime 10/20 >> 10/25 Meropenem 10/25>>  Chauncey Mann, Pharmacy Student  Chelsea Aus D. Laney Potash, PharmD, BCPS, BCCCP 06/05/2018, 1:10 PM

## 2018-06-06 ENCOUNTER — Inpatient Hospital Stay (HOSPITAL_COMMUNITY): Payer: Medicare HMO

## 2018-06-06 LAB — CBC WITH DIFFERENTIAL/PLATELET
BASOS ABS: 0.2 10*3/uL — AB (ref 0.0–0.1)
BASOS PCT: 1 %
Eosinophils Absolute: 0 10*3/uL (ref 0.0–0.5)
Eosinophils Relative: 0 %
HCT: 24.8 % — ABNORMAL LOW (ref 36.0–46.0)
Hemoglobin: 7.6 g/dL — ABNORMAL LOW (ref 12.0–15.0)
Lymphocytes Relative: 9 %
Lymphs Abs: 2.2 10*3/uL (ref 0.7–4.0)
MCH: 24.2 pg — ABNORMAL LOW (ref 26.0–34.0)
MCHC: 30.6 g/dL (ref 30.0–36.0)
MCV: 79 fL — ABNORMAL LOW (ref 80.0–100.0)
Monocytes Absolute: 1 10*3/uL (ref 0.1–1.0)
Monocytes Relative: 4 %
Myelocytes: 1 %
NEUTROS PCT: 85 %
NRBC: 0 % (ref 0.0–0.2)
NRBC: 0 /100{WBCs}
Neutro Abs: 21.2 10*3/uL — ABNORMAL HIGH (ref 1.7–7.7)
PLATELETS: 1042 10*3/uL — AB (ref 150–400)
RBC: 3.14 MIL/uL — ABNORMAL LOW (ref 3.87–5.11)
RDW: 16.5 % — ABNORMAL HIGH (ref 11.5–15.5)
WBC: 24.7 10*3/uL — AB (ref 4.0–10.5)

## 2018-06-06 LAB — CULTURE, RESPIRATORY W GRAM STAIN: Special Requests: NORMAL

## 2018-06-06 LAB — BASIC METABOLIC PANEL
Anion gap: 10 (ref 5–15)
BUN: 35 mg/dL — ABNORMAL HIGH (ref 8–23)
CALCIUM: 9.5 mg/dL (ref 8.9–10.3)
CO2: 30 mmol/L (ref 22–32)
Chloride: 107 mmol/L (ref 98–111)
Creatinine, Ser: 0.6 mg/dL (ref 0.44–1.00)
Glucose, Bld: 122 mg/dL — ABNORMAL HIGH (ref 70–99)
Potassium: 4.1 mmol/L (ref 3.5–5.1)
Sodium: 147 mmol/L — ABNORMAL HIGH (ref 135–145)

## 2018-06-06 LAB — CULTURE, RESPIRATORY

## 2018-06-06 LAB — GLUCOSE, CAPILLARY
GLUCOSE-CAPILLARY: 113 mg/dL — AB (ref 70–99)
GLUCOSE-CAPILLARY: 132 mg/dL — AB (ref 70–99)
GLUCOSE-CAPILLARY: 139 mg/dL — AB (ref 70–99)
Glucose-Capillary: 121 mg/dL — ABNORMAL HIGH (ref 70–99)
Glucose-Capillary: 124 mg/dL — ABNORMAL HIGH (ref 70–99)
Glucose-Capillary: 125 mg/dL — ABNORMAL HIGH (ref 70–99)

## 2018-06-06 LAB — TRIGLYCERIDES: TRIGLYCERIDES: 556 mg/dL — AB (ref <150)

## 2018-06-06 MED ORDER — PROPOFOL 1000 MG/100ML IV EMUL
5.0000 ug/kg/min | INTRAVENOUS | Status: DC
  Administered 2018-06-06: 70 ug/kg/min via INTRAVENOUS
  Administered 2018-06-06: 5 ug/kg/min via INTRAVENOUS
  Administered 2018-06-07 – 2018-06-08 (×7): 80 ug/kg/min via INTRAVENOUS
  Filled 2018-06-06 (×9): qty 100

## 2018-06-06 MED ORDER — PROPOFOL 1000 MG/100ML IV EMUL
INTRAVENOUS | Status: AC
  Filled 2018-06-06: qty 100

## 2018-06-06 MED ORDER — VALPROATE SODIUM 500 MG/5ML IV SOLN
250.0000 mg | Freq: Two times a day (BID) | INTRAVENOUS | Status: DC
  Administered 2018-06-06 (×2): 250 mg via INTRAVENOUS
  Filled 2018-06-06 (×3): qty 2.5

## 2018-06-06 NOTE — Progress Notes (Signed)
NAME:  Courtney Beard, MRN:  [id number], DOB:  11/04/1950, LOS: 14 ADMISSION DATE:  05/23/2018, CONSULTATION DATE:  05/23/2018 REFERRING MD:  Blinda Leatherwood, CHIEF COMPLAINT:  Overdose   Brief History   67 y/o female admitted post cardiac arrest in setting of presumed intentional overdose with benzodiazepine and muscle relaxants.  Bystander CPR initiated, has aspiration pneumonia and ARDS.   Past Medical History  Anxiety, depression, EtOH  Significant Hospital Events   10-12: Presents to ED, mechanically ventilated, central access placed, placed on vasoactive drips, antibiotics initiated 10/13: Placed on neuromuscular blockade given escalating PEEP/FiO2 requirements.  Norepinephrine needs worsened.  Placed on bicarbonate infusion 10/14: Still on high FiO2 and PEEP.  Left a little better when reviewing chest x-ray but right side still with dense consolidation.  Still on high pressor requirements.  Hyperchloremia and acidosis improved.  Stopping bicarbonate infusion.  Adding hypertonic saline neb as well as MetaNeb 10/15: She de-recruited during MetaNeb attempts so this was discontinued.  Shock resolved, BP elevated endotracheal tube at carina required repositioning chest x-ray worse requiring increased  10/16 through 10/19>> continue attempts at diuresis.  Tolerating weaning peep/fiO2. neuromuscular blockade discontinued 17th.  Ongoing aggressive diuresis resulting in decreasing PEEP and FiO2 requirements and improving serial chest x-rays.  From the 17th to the 19th she went from almost 12 L positive down to just 4 L positive. 10/20: PEEP and FiO2 stuck at 10 and 40 respectively.  Spiking fever, white blood cell count up to 17, vancomycin and cefepime added for nosocomial coverage diuresis continued 10/22 PEEP/FiO2 down significantly, O2 improved, stopped vanc, weaning well but not waking up 10/23 weaning on pressure support several hours d/c continuous IV benzo/dilaudid infusion 10/24 weaning on  pressure support several hours 10/25 > fever to 103, added back vanc/change to mero, still weaning, started clonidine  Consults:     Procedures (surgical and bedside):  ETT 10/12 >> CVC 10/12 >> 10/21 PICC 10/21 >>  Significant Diagnostic Tests:  CXR 10/12 > 1. Endotracheal tube tip in the proximal right mainstem bronchus. Retraction by 4 cm recommended. 2. Nasogastric tube side port below the diaphragm but not clearly visualized. Dedicated abdominal radiograph may be helpful. 3. Large area of right parahilar and medial right lung apex consolidation which may indicate infection or neoplastic process. Chest CT or Followup PA and lateral chest X-ray is recommended in 3-4 weeks following trial of antibiotic therapy to ensure resolution and exclude underlying malignancy. CT Head/C-Spine 10/12 > 1. No acute intracranial abnormality.2. No acute fracture of the cervical spine. 3. Large area of right upper lobe consolidation, possibly secondary to aspiration. TTE 11/12 >> EF 60 to 65%, normal RV, estimated PASP 44 mmHg  Micro Data:  Blood 10/12 >> U/A 10/12 >> negative Resp 10/12>>Normal flora Urine culture 10/17 >>> negative Sputum culture 10/17:>>> Few GPC few yeast>>> candida Resp culture 10/20 > candida resp culture 10/24 >> Blood culture 10/24 >   Antimicrobials:  Zosyn 10/12 >> 10/15 Rocephin 10/15>>> 10/20 Vanco 10/13 >> 10/14 Cefepime 10/20 > 10/25 Vancomycin 10/20 > 10/22 Vanc 10/25 >  Mero 10/25 >   Subjective:  Temperature curve down some WBC down Started clonidine Weaned 6-8 hours Very agitated, no diarrhea    Objective   Blood pressure (!) 142/84, pulse (!) 119, temperature 98.5 F (36.9 C), temperature source Axillary, resp. rate (!) 30, height 5\' 2"  (1.575 m), weight 48.2 kg, SpO2 99 %. CVP:  [6 mmHg] 6 mmHg  Vent Mode: CPAP;PSV FiO2 (%):  [40 %]  40 % Set Rate:  [24 bmp] 24 bmp Vt Set:  [400 mL] 400 mL PEEP:  [5 cmH20] 5 cmH20 Pressure Support:  [8  cmH20] 8 cmH20 Plateau Pressure:  [18 cmH20] 18 cmH20   Intake/Output Summary (Last 24 hours) at 06/06/2018 0826 Last data filed at 06/06/2018 0600 Gross per 24 hour  Intake 2227.33 ml  Output 4000 ml  Net -1772.67 ml   Filed Weights   06/04/18 0500 06/05/18 0500 06/06/18 0500  Weight: 58.6 kg 51.1 kg 48.2 kg    Examination:  General:  In bed on vent HENT: NCAT ETT in place PULM: CTA B, vent supported breathing CV: RRR, no mgr GI: BS+, soft, nontender MSK: normal bulk and tone Neuro: sedated on vent, will wake up and follow commands   Resolved Hospital Problem list   Septic shock 10/15  Assessment & Plan:  ARDS: oxygenation slightly better, mildly alkalotic, improving dead space > pressure support as long as tolerate > need mental status to improve prior to extubation > lasix again  > repeat CXR in AM > VAP presention  Thrombocytosis> reactive > monitor  HCAP? I think fevers are due to ARDS but with rising WBC fill compelled to cover until repeat culture back > continue vanc/mero > monitor cultures  Acute toxic metabolic encephalopathy/drug tachyphylaxis on 10/22, improved with rotation of narcotics/benzo; concern for precedex withdrawal > continue precedex > add depakote > continue clonidine for ?precedex withdrawal > continue prn dilaudid and ativan > continue seroquel   Disposition / Summary of Today's Plan 06/06/18   Read above     Diet: tube feeding Pain/Anxiety/Delirium protocol (if indicated):precedex, clonidine, prn ativan, prn dilaudid VAP protocol (if indicated): yes DVT prophylaxis: sub q hep GI prophylaxis: famotidine Hyperglycemia protocol: monitor glucose Mobility: hold PT consult until oxygenation improves Code Status: full Family Communication: updated husband at length on 10/26  Labs   CBC: Recent Labs  Lab 06/01/18 0349 06/03/18 0443 06/04/18 0342 06/05/18 0530 06/06/18 0504  WBC 20.8* 19.2* 22.7* 28.0* 24.7*  NEUTROABS   --  15.8* 19.2* 23.1* 21.2*  HGB 7.9* 7.3* 7.2* 7.7* 7.6*  HCT 22.8* 22.9* 22.8* 25.1* 24.8*  MCV 73.5* 78.2* 79.2* 80.2 79.0*  PLT 529* 750* 798* 984* 1,042*    Basic Metabolic Panel: Recent Labs  Lab 06/01/18 0349 06/02/18 0436 06/03/18 0443 06/04/18 0342 06/05/18 0530 06/06/18 0504  NA 140 140 143 145 144 147*  K 3.7 3.2* 3.9 4.3 4.2 4.1  CL 102 101 104 106 105 107  CO2 34* 31 30 33* 31 30  GLUCOSE 119* 106* 132* 111* 118* 122*  BUN 35* 31* 35* 30* 28* 35*  CREATININE 0.52 0.49 0.69 0.59 0.63 0.60  CALCIUM 8.3* 8.3* 8.6* 8.7* 9.2 9.5  MG 2.4  --   --  2.3 2.4  --   PHOS 3.3  --   --  3.8 3.7  --    GFR: Estimated Creatinine Clearance: 53.3 mL/min (by C-G formula based on SCr of 0.6 mg/dL). Recent Labs  Lab 06/03/18 0443 06/04/18 0342 06/05/18 0530 06/06/18 0504  WBC 19.2* 22.7* 28.0* 24.7*    Liver Function Tests: Recent Labs  Lab 06/01/18 0349  AST 40  ALT 40  ALKPHOS 119  BILITOT 0.5  PROT 5.1*  ALBUMIN 1.5*   No results for input(s): LIPASE, AMYLASE in the last 168 hours. No results for input(s): AMMONIA in the last 168 hours.  ABG    Component Value Date/Time   PHART 7.463 (H)  06/02/2018 0654   PCO2ART 48.0 06/02/2018 0654   PO2ART 75.0 (L) 06/02/2018 0654   HCO3 34.3 (H) 06/02/2018 0654   TCO2 36 (H) 06/02/2018 0654   ACIDBASEDEF 1.0 05/24/2018 1130   O2SAT 95.0 06/02/2018 0654     Coagulation Profile: No results for input(s): INR, PROTIME in the last 168 hours.  Cardiac Enzymes: No results for input(s): CKTOTAL, CKMB, CKMBINDEX, TROPONINI in the last 168 hours.  HbA1C: No results found for: HGBA1C  CBG: Recent Labs  Lab 06/05/18 1541 06/05/18 2003 06/06/18 0012 06/06/18 0356 06/06/18 0753  GLUCAP 126* 135* 139* 121* 124*   My cc time 33 minutes  Heber [place], MD [facility] Pager: [phone] Cell: [phone] After 3pm or if no response, call [phone]

## 2018-06-06 NOTE — Plan of Care (Signed)
  Problem: Skin Integrity: Goal: Risk for impaired skin integrity will decrease 06/06/2018 0553 by Burnett Harry A, RN Outcome: Progressing Note:  Patient repeatedly knocking knees into railings and grinding heels into bed- prevalon boots ordered and placed to prevent skin damage. Also placed pillows surrounding patient to protect.  06/06/2018 0553 by Noe Gens, RN Outcome: Progressing   Problem: Coping: Goal: Level of anxiety will decrease 06/06/2018 0553 by Noe Gens, RN Outcome: Not Progressing Note:  Without any improvement in agitation/ comfort/ restlessness.  06/06/2018 0553 by Noe Gens, RN Outcome: Not Progressing   Problem: Pain Managment: Goal: General experience of comfort will improve 06/06/2018 0553 by Noe Gens, RN Outcome: Not Progressing Note:  Patient with order for Dilaudid 2mg  every hour as needed. Patient given Dilaudid on multiple occasions without any improvement in agitation/ comfort. 06/06/2018 0553 by Noe Gens, RN Outcome: Not Progressing

## 2018-06-06 NOTE — Progress Notes (Signed)
Pt with sustained agitation, all PRNs given. Patient continuously throwing all limbs around in bed, over the side of the bed, turning self side ways in bed. Ventilator tubing repeatedly coming disconnected.  Dr. Kendrick Fries paged. Orders received for propofol. Precedex discontinued. Patient tolerating propofol, desired therapeutic effects achieved. Will continue to monitor. Asher Muir Gomer [place],RN

## 2018-06-07 ENCOUNTER — Inpatient Hospital Stay (HOSPITAL_COMMUNITY): Payer: Medicare HMO

## 2018-06-07 DIAGNOSIS — G9340 Encephalopathy, unspecified: Secondary | ICD-10-CM

## 2018-06-07 LAB — CBC WITH DIFFERENTIAL/PLATELET
ABS IMMATURE GRANULOCYTES: 0.13 10*3/uL — AB (ref 0.00–0.07)
Abs Immature Granulocytes: 0.12 10*3/uL — ABNORMAL HIGH (ref 0.00–0.07)
BASOS ABS: 0.1 10*3/uL (ref 0.0–0.1)
BASOS ABS: 0.1 10*3/uL (ref 0.0–0.1)
BASOS PCT: 0 %
Basophils Relative: 0 %
EOS ABS: 0.5 10*3/uL (ref 0.0–0.5)
EOS PCT: 3 %
Eosinophils Absolute: 0.5 10*3/uL (ref 0.0–0.5)
Eosinophils Relative: 3 %
HCT: 22.8 % — ABNORMAL LOW (ref 36.0–46.0)
HEMATOCRIT: 23.5 % — AB (ref 36.0–46.0)
Hemoglobin: 7.6 g/dL — ABNORMAL LOW (ref 12.0–15.0)
Hemoglobin: 7.3 g/dL — ABNORMAL LOW (ref 12.0–15.0)
Immature Granulocytes: 1 %
Immature Granulocytes: 1 %
LYMPHS ABS: 1.8 10*3/uL (ref 0.7–4.0)
LYMPHS PCT: 11 %
Lymphocytes Relative: 13 %
Lymphs Abs: 1.9 10*3/uL (ref 0.7–4.0)
MCH: 25.9 pg — ABNORMAL LOW (ref 26.0–34.0)
MCH: 25.8 pg — ABNORMAL LOW (ref 26.0–34.0)
MCHC: 32.3 g/dL (ref 30.0–36.0)
MCHC: 32 g/dL (ref 30.0–36.0)
MCV: 79.9 fL — ABNORMAL LOW (ref 80.0–100.0)
MCV: 80.6 fL (ref 80.0–100.0)
Monocytes Absolute: 1.1 10*3/uL — ABNORMAL HIGH (ref 0.1–1.0)
Monocytes Absolute: 1 10*3/uL (ref 0.1–1.0)
Monocytes Relative: 6 %
Monocytes Relative: 7 %
NEUTROS ABS: 13.3 10*3/uL — AB (ref 1.7–7.7)
NRBC: 0 % (ref 0.0–0.2)
NRBC: 0 % (ref 0.0–0.2)
Neutro Abs: 11.3 10*3/uL — ABNORMAL HIGH (ref 1.7–7.7)
Neutrophils Relative %: 79 %
Neutrophils Relative %: 76 %
PLATELETS: 871 10*3/uL — AB (ref 150–400)
Platelets: 950 10*3/uL (ref 150–400)
RBC: 2.94 MIL/uL — AB (ref 3.87–5.11)
RBC: 2.83 MIL/uL — AB (ref 3.87–5.11)
RDW: 16.5 % — ABNORMAL HIGH (ref 11.5–15.5)
RDW: 16.5 % — AB (ref 11.5–15.5)
WBC: 16.8 10*3/uL — AB (ref 4.0–10.5)
WBC: 14.9 10*3/uL — AB (ref 4.0–10.5)

## 2018-06-07 LAB — BASIC METABOLIC PANEL
Anion gap: 9 (ref 5–15)
BUN: 38 mg/dL — AB (ref 8–23)
CHLORIDE: 104 mmol/L (ref 98–111)
CO2: 31 mmol/L (ref 22–32)
Calcium: 9.8 mg/dL (ref 8.9–10.3)
Creatinine, Ser: 0.59 mg/dL (ref 0.44–1.00)
GFR calc Af Amer: >60 mL/min (ref >60)
GFR calc non Af Amer: >60 mL/min (ref >60)
Glucose, Bld: 133 mg/dL — ABNORMAL HIGH (ref 70–99)
POTASSIUM: 3.4 mmol/L — AB (ref 3.5–5.1)
Sodium: 144 mmol/L (ref 135–145)

## 2018-06-07 LAB — GLUCOSE, CAPILLARY
GLUCOSE-CAPILLARY: 104 mg/dL — AB (ref 70–99)
GLUCOSE-CAPILLARY: 113 mg/dL — AB (ref 70–99)
GLUCOSE-CAPILLARY: 118 mg/dL — AB (ref 70–99)
GLUCOSE-CAPILLARY: 181 mg/dL — AB (ref 70–99)
Glucose-Capillary: 98 mg/dL (ref 70–99)
Glucose-Capillary: 112 mg/dL — ABNORMAL HIGH (ref 70–99)
Glucose-Capillary: 103 mg/dL — ABNORMAL HIGH (ref 70–99)

## 2018-06-07 LAB — VANCOMYCIN, TROUGH: VANCOMYCIN TR: 10 ug/mL — AB (ref 15–20)

## 2018-06-07 MED ORDER — VALPROATE SODIUM 500 MG/5ML IV SOLN
500.0000 mg | Freq: Two times a day (BID) | INTRAVENOUS | Status: DC
  Administered 2018-06-07 – 2018-06-17 (×22): 500 mg via INTRAVENOUS
  Filled 2018-06-07 (×25): qty 5

## 2018-06-07 MED ORDER — CLONAZEPAM 0.5 MG PO TBDP
1.0000 mg | ORAL_TABLET | Freq: Three times a day (TID) | ORAL | Status: DC
  Administered 2018-06-07 – 2018-06-08 (×4): 1 mg via ORAL
  Filled 2018-06-07 (×5): qty 2

## 2018-06-07 MED ORDER — METHADONE HCL 5 MG PO TABS
10.0000 mg | ORAL_TABLET | Freq: Two times a day (BID) | ORAL | Status: DC
  Administered 2018-06-07 – 2018-06-08 (×3): 10 mg
  Filled 2018-06-07 (×3): qty 2

## 2018-06-07 MED ORDER — VANCOMYCIN HCL IN DEXTROSE 750-5 MG/150ML-% IV SOLN
750.0000 mg | Freq: Two times a day (BID) | INTRAVENOUS | Status: AC
  Administered 2018-06-08 – 2018-06-11 (×8): 750 mg via INTRAVENOUS
  Filled 2018-06-07 (×8): qty 150

## 2018-06-07 NOTE — Progress Notes (Signed)
Pharmacy Antibiotic Note  Courtney Beard is a 67 y.o. female with concern for HCAP. On D#4 of vancomycin and meropenem. WBC down to 14.9. Afebrile.   A 10.5 hr vancomycin level was subtherapeutic at 10.   Plan: Continue meropenem 1g IV q8h Increase vancomycin to 750 mg IV Q 12 hours Monitor renal fx, cx, vt prn, and fever curve  Height: 5\' 2"  (157.5 cm) Weight: 106 lb 14.8 oz (48.5 kg) IBW/kg (Calculated) : 50.1  Temp (24hrs), Avg:97.8 F (36.6 C), Min:97.8 F (36.6 C), Max:97.9 F (36.6 C)  Recent Labs  Lab 06/03/18 0443 06/04/18 0342 06/05/18 0530 06/06/18 0504 06/07/18 0235 06/07/18 1157  WBC 19.2* 22.7* 28.0* 24.7* 16.8* 14.9*  CREATININE 0.69 0.59 0.63 0.60 0.59  --   VANCOTROUGH  --   --   --   --   --  10*    Estimated Creatinine Clearance: 53.7 mL/min (by C-G formula based on SCr of 0.59 mg/dL).     Microbiology Results 10/12 Bcx: NegF 10/12 TA cx: normal flora  10/12 MRSA PCR: neg 10/17: TA Cx: few candida albicans 10/20 TA cx: few candida albicans 10/24 Ta: few yeast 10/24 Bcx: ngtd   Antibiotics This Admission   Zosyn 10/12 >> 10/15 Vanc 10/13 >> 10/14; 10/20 >> 10/22; 10/25>> Ceftriaxone 10/15 >> 10/20 Cefepime 10/20 >> 10/25 Meropenem 10/25>>  Vinnie Level, PharmD., BCPS Clinical Pharmacist Clinical phone for 06/07/18 until 3:30pm: [phone] If after 3:30pm, please refer to [facility] for unit-specific pharmacist

## 2018-06-07 NOTE — Progress Notes (Signed)
CRITICAL VALUE ALERT  Critical Value: Platelets    Date & Time Notied: 06/07/2018   Provider Notified: Pola Corn RN will notify MD   Orders Received/Actions taken: continue to monitor.

## 2018-06-07 NOTE — Progress Notes (Signed)
NAME:  Courtney Beard, MRN:  [id number], DOB:  [birth year], LOS: 15 ADMISSION DATE:  05/23/2018, CONSULTATION DATE:  05/23/2018 REFERRING MD:  Blinda Leatherwood, CHIEF COMPLAINT:  Overdose   Brief History   67 y/o female admitted post cardiac arrest in setting of presumed intentional overdose with benzodiazepine and muscle relaxants.  Bystander CPR initiated, has aspiration pneumonia and ARDS.   Past Medical History  Anxiety, depression, EtOH  Significant Hospital Events   10-12: Presents to ED, mechanically ventilated, central access placed, placed on vasoactive drips, antibiotics initiated 10/13: Placed on neuromuscular blockade given escalating PEEP/FiO2 requirements.  Norepinephrine needs worsened.  Placed on bicarbonate infusion 10/14: Still on high FiO2 and PEEP.  Left a little better when reviewing chest x-ray but right side still with dense consolidation.  Still on high pressor requirements.  Hyperchloremia and acidosis improved.  Stopping bicarbonate infusion.  Adding hypertonic saline neb as well as MetaNeb 10/15: She de-recruited during MetaNeb attempts so this was discontinued.  Shock resolved, BP elevated endotracheal tube at carina required repositioning chest x-ray worse requiring increased  10/16 through 10/19>> continue attempts at diuresis.  Tolerating weaning peep/fiO2. neuromuscular blockade discontinued 17th.  Ongoing aggressive diuresis resulting in decreasing PEEP and FiO2 requirements and improving serial chest x-rays.  From the 17th to the 19th she went from almost 12 L positive down to just 4 L positive. 10/20: PEEP and FiO2 stuck at 10 and 40 respectively.  Spiking fever, white blood cell count up to 17, vancomycin and cefepime added for nosocomial coverage diuresis continued 10/22 PEEP/FiO2 down significantly, O2 improved, stopped vanc, weaning well but not waking up 10/23 weaning on pressure support several hours d/c continuous IV benzo/dilaudid infusion 10/24 weaning on  pressure support several hours 10/25 > fever to 103, added back vanc/change to mero, still weaning, started clonidine 10/26 weaning 8/5, severely agitated, started propofol  Consults:     Procedures (surgical and bedside):  ETT 10/12 >> CVC 10/12 >> 10/21 PICC 10/21 >>  Significant Diagnostic Tests:  CXR 10/12 > 1. Endotracheal tube tip in the proximal right mainstem bronchus. Retraction by 4 cm recommended. 2. Nasogastric tube side port below the diaphragm but not clearly visualized. Dedicated abdominal radiograph may be helpful. 3. Large area of right parahilar and medial right lung apex consolidation which may indicate infection or neoplastic process. Chest CT or Followup PA and lateral chest X-ray is recommended in 3-4 weeks following trial of antibiotic therapy to ensure resolution and exclude underlying malignancy. CT Head/C-Spine 10/12 > 1. No acute intracranial abnormality.2. No acute fracture of the cervical spine. 3. Large area of right upper lobe consolidation, possibly secondary to aspiration. TTE 11/12 >> EF 60 to 65%, normal RV, estimated PASP 44 mmHg  Micro Data:  Blood 10/12 >> U/A 10/12 >> negative Resp 10/12>>Normal flora Urine culture 10/17 >>> negative Sputum culture 10/17:>>> Few GPC few yeast>>> candida Resp culture 10/20 > candida resp culture 10/24 >> 10/24 Blood culture 10/24 >   Antimicrobials:  Zosyn 10/12 >> 10/15 Rocephin 10/15>>> 10/20 Vanco 10/13 >> 10/14 Cefepime 10/20 > 10/25 Vancomycin 10/20 > 10/22 Vanc 10/25 >  Mero 10/25 >   Subjective:  Severe agitation yesterday, started propofol Weaned on 8/5   Objective   Blood pressure 96/61, pulse (!) 115, temperature 97.8 F (36.6 C), temperature source Axillary, resp. rate (!) 32, height 5\' 2"  (1.575 m), weight 48.5 kg, SpO2 98 %.    Vent Mode: PRVC FiO2 (%):  [40 %] 40 % Set Rate:  [  24 bmp] 24 bmp Vt Set:  [400 mL] 400 mL PEEP:  [5 cmH20] 5 cmH20 Plateau Pressure:  [10 cmH20-21  cmH20] 12 cmH20   Intake/Output Summary (Last 24 hours) at 06/07/2018 1610 Last data filed at 06/07/2018 0100 Gross per 24 hour  Intake 1651.11 ml  Output 2615 ml  Net -963.89 ml   Filed Weights   06/05/18 0500 06/06/18 0500 06/07/18 0446  Weight: 51.1 kg 48.2 kg 48.5 kg    Examination:  General:  In bed on vent HENT: NCAT ETT in place PULM: CTA B, vent supported breathing CV: Tachy, regular, no mgr GI: BS+, soft, nontender MSK: normal bulk and tone Neuro: awake, agitated, not following commands, moves all four extremities   Resolved Hospital Problem list   Septic shock 10/15  Assessment & Plan:  ARDS: oxygenation slightly better, mildly alkalotic, improving dead space, mental status not allowing adequate assessment of respiratory mechanics Family desires to avoid trach if possible but understand it may be medically necessary  > pressure support again > hold lasix today, goal net even > VAP prevention > goal is to get mental status to a point where she can perform spontaneous breathing trial while not severely agitated  Thrombocytosis> reactive, improving > monitor   HCAP > continue vanc/mero 7 day course > monitor cultures  Candida in sputum > if seen in another source (urine/blood) treat for dissemenated candidiasis > at this point hold treatment as oxygenation and CXR are improving without antifungal treatment  Acute toxic metabolic encephalopathy/drug tachyphylaxis on 10/22, improved with rotation of narcotics/benzo; concern for precedex withdrawal > remains severe > wean off propofol > d/c precedex > continue clonidine > continue seroquel > increase clonazepam > add methadone > increase depakote > continue prn ativan/dilaudid   Disposition / Summary of Today's Plan 06/07/18   Read above     Diet: tube feeding Pain/Anxiety/Delirium protocol (if indicated):propfol, depakote, clonazepam, methadone, clonidine, prn ativan, prn dilaudid VAP protocol (if  indicated): yes DVT prophylaxis: sub q hep GI prophylaxis: famotidine Hyperglycemia protocol: monitor glucose Mobility: hold PT consult until oxygenation improves Code Status: full Family Communication: updated husband at length on 10/27  Labs   CBC: Recent Labs  Lab 06/03/18 0443 06/04/18 0342 06/05/18 0530 06/06/18 0504 06/07/18 0235  WBC 19.2* 22.7* 28.0* 24.7* 16.8*  NEUTROABS 15.8* 19.2* 23.1* 21.2* 13.3*  HGB 7.3* 7.2* 7.7* 7.6* 7.6*  HCT 22.9* 22.8* 25.1* 24.8* 23.5*  MCV 78.2* 79.2* 80.2 79.0* 79.9*  PLT 750* 798* 984* 1,042* 950*    Basic Metabolic Panel: Recent Labs  Lab 06/01/18 0349  06/03/18 0443 06/04/18 0342 06/05/18 0530 06/06/18 0504 06/07/18 0235  NA 140   < > 143 145 144 147* 144  K 3.7   < > 3.9 4.3 4.2 4.1 3.4*  CL 102   < > 104 106 105 107 104  CO2 34*   < > 30 33* 31 30 31   GLUCOSE 119*   < > 132* 111* 118* 122* 133*  BUN 35*   < > 35* 30* 28* 35* 38*  CREATININE 0.52   < > 0.69 0.59 0.63 0.60 0.59  CALCIUM 8.3*   < > 8.6* 8.7* 9.2 9.5 9.8  MG 2.4  --   --  2.3 2.4  --   --   PHOS 3.3  --   --  3.8 3.7  --   --    < > = values in this interval not displayed.   GFR: Estimated Creatinine  Clearance: 53.7 mL/min (by C-G formula based on SCr of 0.59 mg/dL). Recent Labs  Lab 06/04/18 0342 06/05/18 0530 06/06/18 0504 06/07/18 0235  WBC 22.7* 28.0* 24.7* 16.8*    Liver Function Tests: Recent Labs  Lab 06/01/18 0349  AST 40  ALT 40  ALKPHOS 119  BILITOT 0.5  PROT 5.1*  ALBUMIN 1.5*   No results for input(s): LIPASE, AMYLASE in the last 168 hours. No results for input(s): AMMONIA in the last 168 hours.  ABG    Component Value Date/Time   PHART 7.463 (H) 06/02/2018 0654   PCO2ART 48.0 06/02/2018 0654   PO2ART 75.0 (L) 06/02/2018 0654   HCO3 34.3 (H) 06/02/2018 0654   TCO2 36 (H) 06/02/2018 0654   ACIDBASEDEF 1.0 05/24/2018 1130   O2SAT 95.0 06/02/2018 0654     Coagulation Profile: No results for input(s): INR, PROTIME in  the last 168 hours.  Cardiac Enzymes: No results for input(s): CKTOTAL, CKMB, CKMBINDEX, TROPONINI in the last 168 hours.  HbA1C: No results found for: HGBA1C  CBG: Recent Labs  Lab 06/06/18 1301 06/06/18 1601 06/06/18 2012 06/07/18 0031 06/07/18 0407  GLUCAP 125* 113* 132* 181* 118*   My cc time 35 minutes  Heber Ryder, MD [facility] Pager: [phone] Cell: [phone] After 3pm or if no response, call [phone]

## 2018-06-08 ENCOUNTER — Inpatient Hospital Stay (HOSPITAL_COMMUNITY): Payer: Medicare HMO

## 2018-06-08 LAB — GLUCOSE, CAPILLARY
GLUCOSE-CAPILLARY: 100 mg/dL — AB (ref 70–99)
GLUCOSE-CAPILLARY: 109 mg/dL — AB (ref 70–99)
GLUCOSE-CAPILLARY: 119 mg/dL — AB (ref 70–99)
Glucose-Capillary: 86 mg/dL (ref 70–99)

## 2018-06-08 LAB — BASIC METABOLIC PANEL
Anion gap: 3 — ABNORMAL LOW (ref 5–15)
BUN: 32 mg/dL — ABNORMAL HIGH (ref 8–23)
CALCIUM: 8.9 mg/dL (ref 8.9–10.3)
CO2: 29 mmol/L (ref 22–32)
CREATININE: 0.61 mg/dL (ref 0.44–1.00)
Chloride: 110 mmol/L (ref 98–111)
GFR calc Af Amer: >60 mL/min (ref >60)
GFR calc non Af Amer: >60 mL/min (ref >60)
GLUCOSE: 102 mg/dL — AB (ref 70–99)
Potassium: 4.1 mmol/L (ref 3.5–5.1)
Sodium: 142 mmol/L (ref 135–145)

## 2018-06-08 LAB — TRIGLYCERIDES: Triglycerides: 220 mg/dL — ABNORMAL HIGH (ref <150)

## 2018-06-08 MED ORDER — JEVITY 1.2 CAL PO LIQD
1000.0000 mL | ORAL | Status: DC
  Administered 2018-06-08 – 2018-06-14 (×5): 1000 mL
  Filled 2018-06-08 (×13): qty 1000

## 2018-06-08 MED ORDER — DEXMEDETOMIDINE HCL IN NACL 400 MCG/100ML IV SOLN
0.4000 ug/kg/h | INTRAVENOUS | Status: DC
  Administered 2018-06-08 – 2018-06-09 (×2): 0.4 ug/kg/h via INTRAVENOUS
  Filled 2018-06-08 (×2): qty 100

## 2018-06-08 MED ORDER — FREE WATER
30.0000 mL | Status: DC
  Administered 2018-06-08 – 2018-06-12 (×22): 30 mL

## 2018-06-08 MED ORDER — CLONIDINE HCL 0.1 MG PO TABS: 0.1000 mg | ORAL_TABLET | Freq: Four times a day (QID) | ORAL | Status: DC

## 2018-06-08 MED ORDER — ALBUTEROL SULFATE (2.5 MG/3ML) 0.083% IN NEBU
2.5000 mg | INHALATION_SOLUTION | RESPIRATORY_TRACT | Status: DC | PRN
  Administered 2018-06-08 – 2018-06-09 (×2): 2.5 mg via RESPIRATORY_TRACT
  Filled 2018-06-08 (×2): qty 3

## 2018-06-08 MED ORDER — ORAL CARE MOUTH RINSE
15.0000 mL | Freq: Two times a day (BID) | OROMUCOSAL | Status: DC
  Administered 2018-06-08 – 2018-06-19 (×20): 15 mL via OROMUCOSAL

## 2018-06-08 NOTE — Progress Notes (Signed)
eLink Physician-Brief Progress Note Patient Name: Courtney Beard DOB: Oct 28, 1950 MRN: [id number]   Date of Service  06/08/2018  HPI/Events of Note  Delirium - Patient attempting to get out of bed and remove PICC line.   eICU Interventions  Will order: 1. Recruitment consultant. 2. Low dose Precedex IV infusion. Titrate to RASS = 0.      Intervention Category Major Interventions: Delirium, psychosis, severe agitation - evaluation and management  [name] 06/08/2018, 9:53 PM

## 2018-06-08 NOTE — Procedures (Signed)
Extubation Procedure Note  Patient Details:   Name: Courtney Beard DOB: 1951/08/09 MRN: [id number]   Airway Documentation:    Vent end date: 06/08/18 Vent end time: 1210   Evaluation  O2 sats: stable throughout Complications: No apparent complications Patient did tolerate procedure well. Bilateral Breath Sounds: Rhonchi   Yes  Toula Moos 06/08/2018, 12:20 PM

## 2018-06-08 NOTE — Progress Notes (Signed)
Nutrition Follow-up  DOCUMENTATION CODES:   Not applicable  INTERVENTION:   Jevity 1.2 @ goal rate of 55 ml/hr- Initiate at 55m/hr and increase by 174mhr every 8 hours until goal rate is reached  Free water flushes 3043m 4 hours  Regimen provides 1583kcal/day, 73g/day protein, 24g/day fiber, 1245m38my free water  NUTRITION DIAGNOSIS:   Inadequate oral intake related to inability to eat as evidenced by NPO status.  Being addressed via TF   GOAL:   Patient will meet greater than or equal to 90% of their needs  Met  MONITOR:   Labs, Weight trends, Vent status, I & O's, TF tolerance  ASSESSMENT:   67 y93 female PMHx anxiety, depression, etoh use. Presents after husband found pt on floor, covered in emesis w/ empty bottles of klonopin and zanaflex. Questionable cardiac arrest. Intubated on arrival to ED. Pt developed septic shock w/ CXR showing aspiration PNA.    Pt extubated today; tolerating well. Plan for cortrak tube placement and continue tube feeds. Will change tube feed formula and recalculate needs now that pt off vent. RD will monitor for SLP evaluation and diet advancement. Per chart, pt with 14lb weight loss since admit; pt is noted to have edema.   Medications reviewed and include: folic acid, heparin, protonix, KCl, thiamine, meropenem, vancomycin   Labs reviewed: K 4.1 wnl, BUN 32(H) Triglycerides 220(H) Wbc- 14.9(H), Hgb 7.3(L), Hct 2.8(L) cbgs- 100, 109 x 24 hrs  Diet Order:   Diet Order            Diet NPO time specified  Diet effective now             EDUCATION NEEDS:   No education needs have been identified at this time  Skin:  Skin Assessment: Skin Integrity Issues: Skin Integrity Issues:: Other (Comment) Other: MASD: sacrum  Last BM:  10/28- type 6  Height:   Ht Readings from Last 1 Encounters:  06/06/18 '5\' 2"'  (1.575 m)    Weight:   Wt Readings from Last 1 Encounters:  06/08/18 49.9 kg    Ideal Body Weight:  50 kg  BMI:   Body mass index is 20.12 kg/m.  Estimated Nutritional Needs:   Kcal:  1400-1600kcal/day   Protein:  65-75g/day   Fluid:  >1.4L/day   CaseKoleen Distance RD, LDN Pager #- [phone]#- [phone] Hours Pager: [phone]

## 2018-06-08 NOTE — Progress Notes (Signed)
eLink Physician-Brief Progress Note Patient Name: [name] DOB: [birth year] MRN: [id number]   Date of Service  06/08/2018  HPI/Events of Note  Foley catheter leaking.   eICU Interventions  Will order: 1. Insert new Foley Catheter.      Intervention Category Major Interventions: Other:  Terresa Marlett Dennard Nip 06/08/2018, 6:10 AM

## 2018-06-08 NOTE — Procedures (Signed)
Cortrak  Person Inserting Tube:  Betsey Holiday, RD Tube Type:  Cortrak - 43 inches Tube Location:  Left nare Initial Placement:  Postpyloric Secured by: Bridle Technique Used to Measure Tube Placement:  Documented cm marking at nare/ corner of mouth Cortrak Secured At:  75 cm Procedure Comments:  Cortrak Tube Team Note:  Consult received to place a Cortrak feeding tube.   X-ray is required, abdominal x-ray has been ordered by the Cortrak team. Please confirm tube placement before using the Cortrak tube.   If the tube becomes dislodged please keep the tube and contact the Cortrak team at [email or web address] (password [email or web address]) for replacement.  If after hours and replacement cannot be delayed, place a NG tube and confirm placement with an abdominal x-ray.  Betsey Holiday MS, RD, LDN Pager #- [phone] Office#- [phone] After Hours Pager: [phone]

## 2018-06-08 NOTE — Progress Notes (Signed)
NAME:  Courtney Beard, MRN:  [id number], DOB:  1950-11-06, LOS: 16 ADMISSION DATE:  05/23/2018, CONSULTATION DATE:  05/23/2018 REFERRING MD:  Blinda Leatherwood, CHIEF COMPLAINT:  Overdose   Brief History   67 y/o female admitted post cardiac arrest in setting of presumed intentional overdose with benzodiazepine and muscle relaxants.  Bystander CPR initiated, has aspiration pneumonia and ARDS.   Past Medical History  Anxiety, depression, EtOH  Significant Hospital Events   10-12: Presents to ED, mechanically ventilated, central access placed, placed on vasoactive drips, antibiotics initiated 10/13: Placed on neuromuscular blockade given escalating PEEP/FiO2 requirements.  Norepinephrine needs worsened.  Placed on bicarbonate infusion 10/14: Still on high FiO2 and PEEP.  Left a little better when reviewing chest x-ray but right side still with dense consolidation.  Still on high pressor requirements.  Hyperchloremia and acidosis improved.  Stopping bicarbonate infusion.  Adding hypertonic saline neb as well as MetaNeb 10/15: She de-recruited during MetaNeb attempts so this was discontinued.  Shock resolved, BP elevated endotracheal tube at carina required repositioning chest x-ray worse requiring increased  10/16 through 10/19>> continue attempts at diuresis.  Tolerating weaning peep/fiO2. neuromuscular blockade discontinued 17th.  Ongoing aggressive diuresis resulting in decreasing PEEP and FiO2 requirements and improving serial chest x-rays.  From the 17th to the 19th she went from almost 12 L positive down to just 4 L positive. 10/20: PEEP and FiO2 stuck at 10 and 40 respectively.  Spiking fever, white blood cell count up to 17, vancomycin and cefepime added for nosocomial coverage diuresis continued 10/22 PEEP/FiO2 down significantly, O2 improved, stopped vanc, weaning well but not waking up 10/23 weaning on pressure support several hours d/c continuous IV benzo/dilaudid infusion 10/24 weaning on  pressure support several hours 10/25 > fever to 103, added back vanc/change to mero, still weaning, started clonidine 10/26 weaning 8/5, severely agitated, started propofol  Consults:     Procedures (surgical and bedside):  ETT 10/12 >> CVC 10/12 >> 10/21 PICC 10/21 >>  Significant Diagnostic Tests:  CXR 10/12 > 1. Endotracheal tube tip in the proximal right mainstem bronchus. Retraction by 4 cm recommended. 2. Nasogastric tube side port below the diaphragm but not clearly visualized. Dedicated abdominal radiograph may be helpful. 3. Large area of right parahilar and medial right lung apex consolidation which may indicate infection or neoplastic process. Chest CT or Followup PA and lateral chest X-ray is recommended in 3-4 weeks following trial of antibiotic therapy to ensure resolution and exclude underlying malignancy. CT Head/C-Spine 10/12 > 1. No acute intracranial abnormality.2. No acute fracture of the cervical spine. 3. Large area of right upper lobe consolidation, possibly secondary to aspiration. TTE 11/12 >> EF 60 to 65%, normal RV, estimated PASP 44 mmHg  Micro Data:  Blood 10/12 >> neg U/A 10/12 >> negative Resp 10/12>>Normal flora Urine culture 10/17 >>> negative Sputum culture 10/17:>>> Few GPC few yeast>>> candida Resp culture 10/20 > candida resp culture 10/24 >> 10/24 Blood culture 10/24 >   Antimicrobials:  Zosyn 10/12 >> 10/15 Rocephin 10/15>>> 10/20 Vanco 10/13 >> 10/14 Cefepime 10/20 > 10/25 Vancomycin 10/20 > 10/22 Vanc 10/25 >  Mero 10/25 >   Subjective:  On propofol/depacon Still prvc on vent  Objective   Blood pressure 106/62, pulse (!) 109, temperature 98 F (36.7 C), temperature source Axillary, resp. rate (!) 22, height 5\' 2"  (1.575 m), weight 49.9 kg, SpO2 100 %.    Vent Mode: PRVC FiO2 (%):  [40 %] 40 % Set Rate:  [24  bmp] 24 bmp Vt Set:  [400 mL] 400 mL PEEP:  [5 cmH20] 5 cmH20 Plateau Pressure:  [12 cmH20-20 cmH20] 20 cmH20    Intake/Output Summary (Last 24 hours) at 06/08/2018 0858 Last data filed at 06/08/2018 0800 Gross per 24 hour  Intake 2039.72 ml  Output 1360 ml  Net 679.72 ml   Filed Weights   06/06/18 0500 06/07/18 0446 06/08/18 0443  Weight: 48.2 kg 48.5 kg 49.9 kg    Examination: General:  In bed on vent, rass -1 HENT: NCAT ETT in place PULM: course breath sounds, diffuse air movement, vent supported breathing CV: tachy, regular rhythm, no murmur GI: BS+, soft, nontender MSK: frail Neuro: awake with some response to commans, moves all four extremities   Resolved Hospital Problem list   Septic shock 10/15  Assessment & Plan:  ARDS: sats 99-100 on vent, improving dead space, mental status not allowing adequate assessment of respiratory mechanics Family desires to avoid trach if possible but understand it may be medically necessary  > pressure support again > VAP prevention > goal is to get mental status to a point where she can perform spontaneous breathing trial while not severely agitated  Thrombocytosis> reactive, improving > monitor   HCAP > continue vanc/mero 7 day course (now day 4 on 10/28) > monitor cultures  Candida in sputum > if seen in another source (urine/blood) treat for dissemenated candidiasis > at this point hold treatment as oxygenation and CXR are improving without antifungal treatment  Acute toxic metabolic encephalopathy/drug tachyphylaxis on 10/22, improved with rotation of narcotics/benzo; concern for precedex withdrawal. remains severe > wean off propofol as tolerated (down to 11 10/28) > off precedex > cont clonidine > cont seroquel > cont clonazepam > cont methadone > cont depakote > cont prn ativan/dilaudid  Disposition / Summary of Today's Plan 06/08/18   Read above    Diet: tube feeding Pain/Anxiety/Delirium protocol (if indicated):propfol, depakote, clonazepam, methadone, clonidine, prn ativan, prn dilaudid VAP protocol (if indicated):  yes DVT prophylaxis: sub q hep GI prophylaxis: famotidine Hyperglycemia protocol: monitor glucose Mobility: hold PT consult until oxygenation improves Code Status: full Family Communication: updated husband at length on 10/27  Labs   CBC: Recent Labs  Lab 06/04/18 0342 06/05/18 0530 06/06/18 0504 06/07/18 0235 06/07/18 1157  WBC 22.7* 28.0* 24.7* 16.8* 14.9*  NEUTROABS 19.2* 23.1* 21.2* 13.3* 11.3*  HGB 7.2* 7.7* 7.6* 7.6* 7.3*  HCT 22.8* 25.1* 24.8* 23.5* 22.8*  MCV 79.2* 80.2 79.0* 79.9* 80.6  PLT 798* 984* 1,042* 950* 871*    Basic Metabolic Panel: Recent Labs  Lab 06/04/18 0342 06/05/18 0530 06/06/18 0504 06/07/18 0235 06/08/18 0404  NA 145 144 147* 144 142  K 4.3 4.2 4.1 3.4* 4.1  CL 106 105 107 104 110  CO2 33* 31 30 31 29   GLUCOSE 111* 118* 122* 133* 102*  BUN 30* 28* 35* 38* 32*  CREATININE 0.59 0.63 0.60 0.59 0.61  CALCIUM 8.7* 9.2 9.5 9.8 8.9  MG 2.3 2.4  --   --   --   PHOS 3.8 3.7  --   --   --    GFR: Estimated Creatinine Clearance: 55.2 mL/min (by C-G formula based on SCr of 0.61 mg/dL). Recent Labs  Lab 06/05/18 0530 06/06/18 0504 06/07/18 0235 06/07/18 1157  WBC 28.0* 24.7* 16.8* 14.9*    Liver Function Tests: No results for input(s): AST, ALT, ALKPHOS, BILITOT, PROT, ALBUMIN in the last 168 hours. No results for input(s): LIPASE, AMYLASE in the  last 168 hours. No results for input(s): AMMONIA in the last 168 hours.  ABG    Component Value Date/Time   PHART 7.463 (H) 06/02/2018 0654   PCO2ART 48.0 06/02/2018 0654   PO2ART 75.0 (L) 06/02/2018 0654   HCO3 34.3 (H) 06/02/2018 0654   TCO2 36 (H) 06/02/2018 0654   ACIDBASEDEF 1.0 05/24/2018 1130   O2SAT 95.0 06/02/2018 0654     Coagulation Profile: No results for input(s): INR, PROTIME in the last 168 hours.  Cardiac Enzymes: No results for input(s): CKTOTAL, CKMB, CKMBINDEX, TROPONINI in the last 168 hours.  HbA1C: No results found for: HGBA1C  CBG: Recent Labs  Lab  06/07/18 1158 06/07/18 1608 06/07/18 1936 06/07/18 2309 06/08/18 0736  GLUCAP 113* 98 112* 103* 100*    Marthenia Rolling, DO  PGY2 [facility] 06/08/2018 9:17 AM

## 2018-06-09 LAB — CBC
HEMATOCRIT: 24.1 % — AB (ref 36.0–46.0)
HEMOGLOBIN: 7.4 g/dL — AB (ref 12.0–15.0)
MCH: 24.2 pg — AB (ref 26.0–34.0)
MCHC: 30.7 g/dL (ref 30.0–36.0)
MCV: 78.8 fL — AB (ref 80.0–100.0)
NRBC: 0 % (ref 0.0–0.2)
PLATELETS: 917 10*3/uL — AB (ref 150–400)
RBC: 3.06 MIL/uL — ABNORMAL LOW (ref 3.87–5.11)
RDW: 15.7 % — ABNORMAL HIGH (ref 11.5–15.5)
WBC: 19.3 10*3/uL — ABNORMAL HIGH (ref 4.0–10.5)

## 2018-06-09 LAB — CULTURE, BLOOD (ROUTINE X 2)
CULTURE: NO GROWTH
Culture: NO GROWTH
Special Requests: ADEQUATE

## 2018-06-09 LAB — PATHOLOGIST SMEAR REVIEW

## 2018-06-09 LAB — BASIC METABOLIC PANEL
ANION GAP: 9 (ref 5–15)
BUN: 24 mg/dL — AB (ref 8–23)
CHLORIDE: 107 mmol/L (ref 98–111)
CO2: 29 mmol/L (ref 22–32)
Calcium: 9.4 mg/dL (ref 8.9–10.3)
Creatinine, Ser: 0.66 mg/dL (ref 0.44–1.00)
GFR calc Af Amer: >60 mL/min (ref >60)
GLUCOSE: 141 mg/dL — AB (ref 70–99)
POTASSIUM: 3.3 mmol/L — AB (ref 3.5–5.1)
Sodium: 145 mmol/L (ref 135–145)

## 2018-06-09 LAB — GLUCOSE, CAPILLARY
GLUCOSE-CAPILLARY: 119 mg/dL — AB (ref 70–99)
Glucose-Capillary: 127 mg/dL — ABNORMAL HIGH (ref 70–99)
Glucose-Capillary: 131 mg/dL — ABNORMAL HIGH (ref 70–99)
Glucose-Capillary: 135 mg/dL — ABNORMAL HIGH (ref 70–99)

## 2018-06-09 MED ORDER — FAMOTIDINE 40 MG/5ML PO SUSR
20.0000 mg | Freq: Every day | ORAL | Status: DC
  Administered 2018-06-09 – 2018-06-19 (×11): 20 mg via ORAL
  Filled 2018-06-09 (×11): qty 2.5

## 2018-06-09 MED ORDER — QUETIAPINE FUMARATE 25 MG PO TABS
50.0000 mg | ORAL_TABLET | Freq: Two times a day (BID) | ORAL | Status: DC
  Administered 2018-06-09 – 2018-06-17 (×15): 50 mg
  Filled 2018-06-09: qty 1
  Filled 2018-06-09 (×7): qty 2
  Filled 2018-06-09: qty 1
  Filled 2018-06-09: qty 2
  Filled 2018-06-09: qty 1
  Filled 2018-06-09 (×2): qty 2
  Filled 2018-06-09: qty 1
  Filled 2018-06-09 (×3): qty 2

## 2018-06-09 NOTE — Care Management Note (Signed)
Case Management Note Donn Pierini RN,BSN Transitions of Care Unit 43M - RN Case Manager [phone]  Patient Details  Name: Courtney Beard MRN: [id number] Date of Birth: 1950-09-25  Subjective/Objective:   Pt admitted s/p cardiac arrest with overdose after being found down at home. 10/15 remains on vent with acute hypoxic resp. Distress in setting of toxic ingestion and asp. Pna.                 Action/Plan: PTA pt lived at home with spouse, independent- CM and CSW to follow for transition of care needs  Expected Discharge Date:                  Expected Discharge Plan:     In-House Referral:  Clinical Social Work  Discharge planning Services  CM Consult  Post Acute Care Choice:    Choice offered to:     DME Arranged:    DME Agency:     HH Arranged:    HH Agency:     Status of Service:  In process, will continue to follow  If discussed at Long Length of Stay Meetings, dates discussed:    Discharge Disposition:   Additional Comments:  06/09/18- 1430- Donn Pierini RN, CM- pt remains with delirium- on IV precedex. Extubated 10/28- CSW and CM continue to follow for transition of care needs   Darrold Span, RN 06/09/2018, 2:38 PM

## 2018-06-09 NOTE — Progress Notes (Signed)
Oral and nasotracheal suctioned patient due to Rhonchi and increasing secretions that she could not generate.  Patient tolerated with the help of another RT's assistance.

## 2018-06-09 NOTE — Progress Notes (Signed)
NAME:  Courtney Beard, MRN:  [id number], DOB:  January 24, 1951, LOS: 17 ADMISSION DATE:  05/23/2018, CONSULTATION DATE:  05/23/2018 REFERRING MD:  Blinda Leatherwood, CHIEF COMPLAINT:  Overdose   Brief History   67 y/o female admitted post cardiac arrest in setting of presumed intentional overdose with benzodiazepine and muscle relaxants.  Bystander CPR initiated, has aspiration pneumonia and ARDS.   Past Medical History  Anxiety, depression, EtOH  Significant Hospital Events   10-12: Presents to ED, mechanically ventilated, central access placed, placed on vasoactive drips, antibiotics initiated 10/13: Placed on neuromuscular blockade given escalating PEEP/FiO2 requirements.  Norepinephrine needs worsened.  Placed on bicarbonate infusion 10/14: Still on high FiO2 and PEEP.  Left a little better when reviewing chest x-ray but right side still with dense consolidation.  Still on high pressor requirements.  Hyperchloremia and acidosis improved.  Stopping bicarbonate infusion.  Adding hypertonic saline neb as well as MetaNeb 10/15: She de-recruited during MetaNeb attempts so this was discontinued.  Shock resolved, BP elevated endotracheal tube at [place] required repositioning chest x-ray worse requiring increased  10/16 through 10/19>> continue attempts at diuresis.  Tolerating weaning peep/fiO2. neuromuscular blockade discontinued 17th.  Ongoing aggressive diuresis resulting in decreasing PEEP and FiO2 requirements and improving serial chest x-rays.  From the 17th to the 19th she went from almost 12 L positive down to just 4 L positive. 10/20: PEEP and FiO2 stuck at 10 and 40 respectively.  Spiking fever, white blood cell count up to 17, vancomycin and cefepime added for nosocomial coverage diuresis continued 10/22 PEEP/FiO2 down significantly, O2 improved, stopped vanc, weaning well but not waking up 10/23 weaning on pressure support several hours d/c continuous IV benzo/dilaudid infusion 10/24 weaning on  pressure support several hours 10/25 > fever to 103, added back vanc/change to mero, still weaning, started clonidine 10/26 weaning 8/5, severely agitated, started propofol  Consults:     Procedures (surgical and bedside):  ETT 10/12 >> 10/28 CVC 10/12 >> 10/21 PICC 10/21 >>  Significant Diagnostic Tests:  CXR 10/12 > 1. Endotracheal tube tip in the proximal right mainstem bronchus. Retraction by 4 cm recommended. 2. Nasogastric tube side port below the diaphragm but not clearly visualized. Dedicated abdominal radiograph may be helpful. 3. Large area of right parahilar and medial right lung apex consolidation which may indicate infection or neoplastic process. Chest CT or Followup PA and lateral chest X-ray is recommended in 3-4 weeks following trial of antibiotic therapy to ensure resolution and exclude underlying malignancy. CT Head/C-Spine 10/12 > 1. No acute intracranial abnormality.2. No acute fracture of the cervical spine. 3. Large area of right upper lobe consolidation, possibly secondary to aspiration. TTE 11/12 >> EF 60 to 65%, normal RV, estimated PASP 44 mmHg  Micro Data:  Blood 10/12 >> neg U/A 10/12 >> negative Resp 10/12>>Normal flora Urine culture 10/17 >>> negative Sputum culture 10/17:>>> Few GPC few yeast>>> candida Resp culture 10/20 > candida resp culture 10/24 >> 10/24 Blood culture 10/24 >   Antimicrobials:  Zosyn 10/12 >> 10/15 Rocephin 10/15>>> 10/20 Vanco 10/13 >> 10/14 Cefepime 10/20 > 10/25 Vancomycin 10/20 > 10/22 Vanc 10/25 >  Mero 10/25 >   Subjective:  Delirium overnight 10/28, precedex ceiling was increased, still with depakote, prn ativan First full day off vent  Objective   Blood pressure 121/63, pulse (!) 117, temperature 98.1 F (36.7 C), temperature source Axillary, resp. rate 16, height 5\' 2"  (1.575 m), weight 49.5 kg, SpO2 96 %.    On [place] 4L  Intake/Output Summary (Last 24 hours) at 06/09/2018 0752 Last data filed at  06/09/2018 0700 Gross per 24 hour  Intake 1788.88 ml  Output 2445 ml  Net -656.12 ml   Filed Weights   06/07/18 0446 06/08/18 0443 06/09/18 0413  Weight: 48.5 kg 49.9 kg 49.5 kg    Examination: General:  In bed on [place] 4L, awake and spontaneously moving but not respondign to commands HENT: [place], EOMI, making eye contact PULM: course breath sounds, diffuse air movement on [place] CV: tachy, no new murmur GI: soft, no pain response MSK: frail Neuro: awake and moving, not obeying commands  Resolved Hospital Problem list   Septic shock 10/15  Assessment & Plan:  ARDS 2/2 hcap: sats 99-100 on [place] 4L, still with delirium.  Family desires to avoid trach if possible but understand it may be medically necessary if she fails extubation > continue vanc/mero 7 day course (now day 4 on 10/28) > monitor cultures >titrate down O2 if satting >92%  Thrombocytosis> reactive, improving, 10/29 pending > monitor   Candida in sputum. Blood cx pending > if seen in another source (urine/blood) treat for dissemenated candidiasis > at this point hold treatment as oxygenation and CXR are improving without antifungal treatment  Acute toxic metabolic encephalopathy/drug tachyphylaxis on 10/22, improved with rotation of narcotics/benzo; concern for precedex withdrawal. remains severe > off propofol  > precedex ceiling raised overnight > d/c clonidine > cont seroquel > cont clonazepam > d/c methadone > cont depakote > cont prn ativan/dilaudid  Disposition / Summary of Today's Plan 06/09/18   Read above    Diet: tube feeding via coretrak Pain/Anxiety/Delirium protocol (if indicated):precedex, depakote, clonazepam, methadone, seroquel,  prn ativan, prn dilaudid VAP protocol (if indicated): yes DVT prophylaxis: sub q hep GI prophylaxis: famotidine Hyperglycemia protocol: monitor glucose Mobility: hold PT consult until oxygenation improves Code Status: full Family Communication: updated husband at  length on 10/28  Labs   CBC: Recent Labs  Lab 06/04/18 0342 06/05/18 0530 06/06/18 0504 06/07/18 0235 06/07/18 1157  WBC 22.7* 28.0* 24.7* 16.8* 14.9*  NEUTROABS 19.2* 23.1* 21.2* 13.3* 11.3*  HGB 7.2* 7.7* 7.6* 7.6* 7.3*  HCT 22.8* 25.1* 24.8* 23.5* 22.8*  MCV 79.2* 80.2 79.0* 79.9* 80.6  PLT 798* 984* 1,042* 950* 871*    Basic Metabolic Panel: Recent Labs  Lab 06/04/18 0342 06/05/18 0530 06/06/18 0504 06/07/18 0235 06/08/18 0404  NA 145 144 147* 144 142  K 4.3 4.2 4.1 3.4* 4.1  CL 106 105 107 104 110  CO2 33* 31 30 31 29   GLUCOSE 111* 118* 122* 133* 102*  BUN 30* 28* 35* 38* 32*  CREATININE 0.59 0.63 0.60 0.59 0.61  CALCIUM 8.7* 9.2 9.5 9.8 8.9  MG 2.3 2.4  --   --   --   PHOS 3.8 3.7  --   --   --    GFR: Estimated Creatinine Clearance: 54.8 mL/min (by C-G formula based on SCr of 0.61 mg/dL). Recent Labs  Lab 06/05/18 0530 06/06/18 0504 06/07/18 0235 06/07/18 1157  WBC 28.0* 24.7* 16.8* 14.9*    Liver Function Tests: No results for input(s): AST, ALT, ALKPHOS, BILITOT, PROT, ALBUMIN in the last 168 hours. No results for input(s): LIPASE, AMYLASE in the last 168 hours. No results for input(s): AMMONIA in the last 168 hours.  ABG    Component Value Date/Time   PHART 7.463 (H) 06/02/2018 0654   PCO2ART 48.0 06/02/2018 0654   PO2ART 75.0 (L) 06/02/2018 0654   HCO3 34.3 (  H) 06/02/2018 0654   TCO2 36 (H) 06/02/2018 0654   ACIDBASEDEF 1.0 05/24/2018 1130   O2SAT 95.0 06/02/2018 0654     Coagulation Profile: No results for input(s): INR, PROTIME in the last 168 hours.  Cardiac Enzymes: No results for input(s): CKTOTAL, CKMB, CKMBINDEX, TROPONINI in the last 168 hours.  HbA1C: No results found for: HGBA1C  CBG: Recent Labs  Lab 06/08/18 0736 06/08/18 1117 06/08/18 1518 06/08/18 2029 06/09/18 0014  GLUCAP 100* 109* 86 119* 127*    Marthenia Rolling, DO  PGY2 [facility] 06/09/2018 7:52 AM

## 2018-06-09 NOTE — Progress Notes (Signed)
eLink Physician-Brief Progress Note Patient Name: Courtney Beard DOB: Jan 07, 1951 MRN: [id number]   Date of Service  06/09/2018  HPI/Events of Note  Delirium - Request for bilateral soft wrist restraints and increase ceiling on Precedex.   eICU Interventions  Will order: 1. Bilateral soft wrist restraints.  2. Ceiling on Precedex IV infusion increased to 1.2 mcg/kg/hour.      Intervention Category Major Interventions: Delirium, psychosis, severe agitation - evaluation and management  Shaune Malacara Eugene 06/09/2018, 5:45 AM

## 2018-06-10 ENCOUNTER — Inpatient Hospital Stay (HOSPITAL_COMMUNITY): Payer: Medicare HMO

## 2018-06-10 LAB — CBC
HCT: 24 % — ABNORMAL LOW (ref 36.0–46.0)
Hemoglobin: 7.3 g/dL — ABNORMAL LOW (ref 12.0–15.0)
MCH: 24.7 pg — AB (ref 26.0–34.0)
MCHC: 30.4 g/dL (ref 30.0–36.0)
MCV: 81.1 fL (ref 80.0–100.0)
PLATELETS: 767 10*3/uL — AB (ref 150–400)
RBC: 2.96 MIL/uL — ABNORMAL LOW (ref 3.87–5.11)
RDW: 15.7 % — ABNORMAL HIGH (ref 11.5–15.5)
WBC: 22.7 10*3/uL — AB (ref 4.0–10.5)
nRBC: 0 % (ref 0.0–0.2)

## 2018-06-10 LAB — BASIC METABOLIC PANEL
Anion gap: 7 (ref 5–15)
BUN: 26 mg/dL — AB (ref 8–23)
CHLORIDE: 111 mmol/L (ref 98–111)
CO2: 31 mmol/L (ref 22–32)
CREATININE: 0.59 mg/dL (ref 0.44–1.00)
Calcium: 9.3 mg/dL (ref 8.9–10.3)
GFR calc non Af Amer: >60 mL/min (ref >60)
Glucose, Bld: 125 mg/dL — ABNORMAL HIGH (ref 70–99)
Potassium: 4 mmol/L (ref 3.5–5.1)
SODIUM: 149 mmol/L — AB (ref 135–145)

## 2018-06-10 MED ORDER — LIP MEDEX EX OINT
TOPICAL_OINTMENT | CUTANEOUS | Status: DC | PRN
  Administered 2018-06-10: 12:00:00 via TOPICAL
  Administered 2018-06-12: 1 via TOPICAL
  Filled 2018-06-10 (×2): qty 7

## 2018-06-10 MED ORDER — GUAIFENESIN 100 MG/5ML PO SOLN
10.0000 mL | ORAL | Status: DC | PRN
  Administered 2018-06-16: 200 mg
  Filled 2018-06-10 (×2): qty 10

## 2018-06-10 MED ORDER — SODIUM CHLORIDE 0.9 % IV SOLN
250.0000 mL | INTRAVENOUS | Status: DC
  Administered 2018-06-10 – 2018-06-11 (×2): 250 mL via INTRAVENOUS

## 2018-06-10 MED ORDER — HYDROMORPHONE HCL 1 MG/ML IJ SOLN
1.0000 mg | INTRAMUSCULAR | Status: DC | PRN
  Administered 2018-06-14: 1 mg via INTRAVENOUS
  Filled 2018-06-10: qty 1

## 2018-06-10 NOTE — Progress Notes (Signed)
Pt required nasotracheal suctioning with assistance from RN as RT retrieved secretions.  Patient had a great cough generated once catheter was in place and generated a large amount of secretions from lungs.  Patient has been requiring this 2 x's a day and it has been helping her tremendously.  After suctioning patient was breathing easier and seemed comfortable.

## 2018-06-10 NOTE — Progress Notes (Signed)
NAME:  Courtney Beard, MRN:  [id number], DOB:  April 27, 1951, LOS: 18 ADMISSION DATE:  05/23/2018, CONSULTATION DATE:  05/23/2018 REFERRING MD:  Blinda Leatherwood, CHIEF COMPLAINT:  Overdose   Brief History   67 y/o female admitted post cardiac arrest in setting of presumed intentional overdose with benzodiazepine and muscle relaxants.  Bystander CPR initiated, has aspiration pneumonia and ARDS.  Extubated 10/28.  Past Medical History  Anxiety, depression, EtOH  Significant Hospital Events   10-12: Presents to ED, mechanically ventilated, central access placed, placed on vasoactive drips, antibiotics initiated 10/13: Placed on neuromuscular blockade given escalating PEEP/FiO2 requirements.  Norepinephrine needs worsened.  Placed on bicarbonate infusion 10/14: Still on high FiO2 and PEEP.  Left a little better when reviewing chest x-ray but right side still with dense consolidation.  Still on high pressor requirements.  Hyperchloremia and acidosis improved.  Stopping bicarbonate infusion.  Adding hypertonic saline neb as well as MetaNeb 10/15: She de-recruited during MetaNeb attempts so this was discontinued.  Shock resolved, BP elevated endotracheal tube at [place] required repositioning chest x-ray worse requiring increased  10/16 through 10/19>> continue attempts at diuresis.  Tolerating weaning peep/fiO2. neuromuscular blockade discontinued 17th.  Ongoing aggressive diuresis resulting in decreasing PEEP and FiO2 requirements and improving serial chest x-rays.  From the 17th to the 19th she went from almost 12 L positive down to just 4 L positive. 10/20: PEEP and FiO2 stuck at 10 and 40 respectively.  Spiking fever, white blood cell count up to 17, vancomycin and cefepime added for nosocomial coverage diuresis continued 10/22 PEEP/FiO2 down significantly, O2 improved, stopped vanc, weaning well but not waking up 10/23 weaning on pressure support several hours d/c continuous IV benzo/dilaudid  infusion 10/24 weaning on pressure support several hours 10/25 > fever to 103, added back vanc/change to mero, still weaning, started clonidine 10/26 weaning 8/5, severely agitated, started propofol 10/28 extubated  Consults:     Procedures (surgical and bedside):  ETT 10/12 >> 10/28 CVC 10/12 >> 10/21 PICC 10/21 >>  Significant Diagnostic Tests:  CXR 10/12 > 1. Endotracheal tube tip in the proximal right mainstem bronchus. Retraction by 4 cm recommended. 2. Nasogastric tube side port below the diaphragm but not clearly visualized. Dedicated abdominal radiograph may be helpful. 3. Large area of right parahilar and medial right lung apex consolidation which may indicate infection or neoplastic process. Chest CT or Followup PA and lateral chest X-ray is recommended in 3-4 weeks following trial of antibiotic therapy to ensure resolution and exclude underlying malignancy. CT Head/C-Spine 10/12 > 1. No acute intracranial abnormality.2. No acute fracture of the cervical spine. 3. Large area of right upper lobe consolidation, possibly secondary to aspiration. TTE 11/12 >> EF 60 to 65%, normal RV, estimated PASP 44 mmHg  Micro Data:  Blood 10/12 >> neg U/A 10/12 >> negative Resp 10/12>>Normal flora Urine culture 10/17 >>> negative Sputum culture 10/17:>>> Few GPC few yeast>>> candida Resp culture 10/20 > candida resp culture 10/24 >> 10/24 Blood culture 10/24 > neg  Antimicrobials:  Zosyn 10/12 >> 10/15 Rocephin 10/15>>> 10/20 Vanco 10/13 >> 10/14 Cefepime 10/20 > 10/25 Vancomycin 10/20 > 10/22 Vanc 10/25 >  Mero 10/25 >   Subjective:  Per RN, a lot of movement/agitation, has been on restraints but consistently trying to move  Objective   Blood pressure (!) 147/99, pulse (!) 133, temperature 98.2 F (36.8 C), temperature source Oral, resp. rate (!) 21, height 5\' 2"  (1.575 m), weight 47.3 kg, SpO2 97 %.  On [place] 4L  Intake/Output Summary (Last 24 hours) at 06/10/2018  0738 Last data filed at 06/10/2018 0609 Gross per 24 hour  Intake 2238.69 ml  Output 1710 ml  Net 528.69 ml   Filed Weights   06/08/18 0443 06/09/18 0413 06/10/18 0500  Weight: 49.9 kg 49.5 kg 47.3 kg    Examination:  General:  In bed on [place], awake and spontaneously moving but not respondign to commands, does seem to have some reaction to jokes about the hospital HENT: EOMI, making eye contact intermittently PULM: good airmovement diffusely w/ rhonchi CV: tachy to 120s, strip shows sinus rhythm GI: soft, non-tender MSK: frail Neuro: very able to move all limbs at will, does not seem to be consistently understanding verbal communication  Resolved Hospital Problem list   Septic shock 10/15  Assessment & Plan:  ARDS 2/2 hcap: sats 96-100 on [place], still with delirium vs new baseline encephalopathy. Blood cx neg > continue vanc/mero 7 day course (now day 6 on 10/30)  Thrombocytosis> reactive, improving, 10/29 pending > monitor   Candida in sputum. Blood cx pending, blood cx neg > at this point hold treatment as oxygenation and CXR are improving without antifungal treatment  Acute toxic metabolic encephalopathy/drug tachyphylaxis on 10/22, weaning off sedating/altering medicines to try to establish true status > cont on decreased seroquel (down to 50BID 10/29) > cont depakote > cont prn ativan > decrease prn dilaudid to 1mg  Q4 from 1-2mg  Q1  Tachycardia: 120s/130s, sinus tach.  Agitation could be contribution -cont to monitor strip -goal is that patient's mental status will improve and in turn assist return to normal rate -ecg/cxr  Hypernatremia:149 Will monitor  Anemia: Hgb 7.3, stable from prior 4 days.  Stable BP, no obvious site of bleeding -continue to monitor, trheasholed <7  Disposition / Summary of Today's Plan 06/10/18   Read above    Diet: tube feeding via coretrak Pain/Anxiety/Delirium protocol (if indicated):depakote, seroquel,  prn ativan, prn dilaudid VAP  protocol (if indicated): no DVT prophylaxis: sub q hep GI prophylaxis: famotidine Hyperglycemia protocol: monitor glucose Mobility: hold PT consult until agitation improves Code Status: full Family Communication: update husband whenever he is bedside  Labs   CBC: Recent Labs  Lab 06/04/18 0342 06/05/18 0530 06/06/18 0504 06/07/18 0235 06/07/18 1157 06/09/18 0857 06/10/18 0412  WBC 22.7* 28.0* 24.7* 16.8* 14.9* 19.3* 22.7*  NEUTROABS 19.2* 23.1* 21.2* 13.3* 11.3*  --   --   HGB 7.2* 7.7* 7.6* 7.6* 7.3* 7.4* 7.3*  HCT 22.8* 25.1* 24.8* 23.5* 22.8* 24.1* 24.0*  MCV 79.2* 80.2 79.0* 79.9* 80.6 78.8* 81.1  PLT 798* 984* 1,042* 950* 871* 917* 767*    Basic Metabolic Panel: Recent Labs  Lab 06/04/18 0342 06/05/18 0530 06/06/18 0504 06/07/18 0235 06/08/18 0404 06/09/18 0857 06/10/18 0412  NA 145 144 147* 144 142 145 149*  K 4.3 4.2 4.1 3.4* 4.1 3.3* 4.0  CL 106 105 107 104 110 107 111  CO2 33* 31 30 31 29 29 31   GLUCOSE 111* 118* 122* 133* 102* 141* 125*  BUN 30* 28* 35* 38* 32* 24* 26*  CREATININE 0.59 0.63 0.60 0.59 0.61 0.66 0.59  CALCIUM 8.7* 9.2 9.5 9.8 8.9 9.4 9.3  MG 2.3 2.4  --   --   --   --   --   PHOS 3.8 3.7  --   --   --   --   --    GFR: Estimated Creatinine Clearance: 52.4 mL/min (by C-G formula based  on SCr of 0.59 mg/dL). Recent Labs  Lab 06/07/18 0235 06/07/18 1157 06/09/18 0857 06/10/18 0412  WBC 16.8* 14.9* 19.3* 22.7*    Liver Function Tests: No results for input(s): AST, ALT, ALKPHOS, BILITOT, PROT, ALBUMIN in the last 168 hours. No results for input(s): LIPASE, AMYLASE in the last 168 hours. No results for input(s): AMMONIA in the last 168 hours.  ABG    Component Value Date/Time   PHART 7.463 (H) 06/02/2018 0654   PCO2ART 48.0 06/02/2018 0654   PO2ART 75.0 (L) 06/02/2018 0654   HCO3 34.3 (H) 06/02/2018 0654   TCO2 36 (H) 06/02/2018 0654   ACIDBASEDEF 1.0 05/24/2018 1130   O2SAT 95.0 06/02/2018 0654     Coagulation  Profile: No results for input(s): INR, PROTIME in the last 168 hours.  Cardiac Enzymes: No results for input(s): CKTOTAL, CKMB, CKMBINDEX, TROPONINI in the last 168 hours.  HbA1C: No results found for: HGBA1C  CBG: Recent Labs  Lab 06/08/18 2029 06/09/18 0014 06/09/18 0815 06/09/18 1225 06/09/18 1635  GLUCAP 119* 127* 135* 131* 119*    Marthenia Rolling, DO  PGY2 [facility] 06/10/2018 7:38 AM

## 2018-06-10 NOTE — Progress Notes (Signed)
Patient now in 4pt soft restraints.  Patient has not slept all night.  Multiple deep suction attempts with RT.  Upper resp. still wet and raspy with heavy ronchi.  Patient continuously in motion sliding down bed and weaving side to side even in 4pt.  Patient does not follow commands and tried biting off her mitten.  Patients behavior appears indicative of neuro injury.  Sitter at bedside.  Patient remains safe at this time.

## 2018-06-10 NOTE — Progress Notes (Signed)
Pt started to have increasing secretions again after several hours since last NTS.  Again oral and nasotracheal suctioned patient retrieved a lot and yellowish tan secretions and caused patient to generate good cough for oral suctioning.

## 2018-06-10 NOTE — Progress Notes (Signed)
Patient remains sinus tach, 130bpm, despite multiple doses of hydromorphone IVP given as per MD order.

## 2018-06-10 NOTE — Progress Notes (Signed)
Transfer of care:  Patient with good respiratory function on room air.  Still with significant agitation vs delirium as we wean sedating/altering medications.  Plan was to continue on current dosing for 10/30 to give full day on this regimen for evaluation.  [facility] paged for transfer and accepted  -Dr. Parke Simmers

## 2018-06-11 DIAGNOSIS — J9600 Acute respiratory failure, unspecified whether with hypoxia or hypercapnia: Secondary | ICD-10-CM

## 2018-06-11 LAB — CBC
HEMATOCRIT: 25.5 % — AB (ref 36.0–46.0)
Hemoglobin: 7.7 g/dL — ABNORMAL LOW (ref 12.0–15.0)
MCH: 24.3 pg — ABNORMAL LOW (ref 26.0–34.0)
MCHC: 30.2 g/dL (ref 30.0–36.0)
MCV: 80.4 fL (ref 80.0–100.0)
Platelets: 895 10*3/uL — ABNORMAL HIGH (ref 150–400)
RBC: 3.17 MIL/uL — AB (ref 3.87–5.11)
RDW: 15.5 % (ref 11.5–15.5)
WBC: 22 10*3/uL — AB (ref 4.0–10.5)
nRBC: 0 % (ref 0.0–0.2)

## 2018-06-11 LAB — BASIC METABOLIC PANEL
Anion gap: 10 (ref 5–15)
BUN: 26 mg/dL — ABNORMAL HIGH (ref 8–23)
CALCIUM: 9.4 mg/dL (ref 8.9–10.3)
CO2: 27 mmol/L (ref 22–32)
Chloride: 111 mmol/L (ref 98–111)
Creatinine, Ser: 0.66 mg/dL (ref 0.44–1.00)
GFR calc non Af Amer: >60 mL/min (ref >60)
GLUCOSE: 153 mg/dL — AB (ref 70–99)
POTASSIUM: 3.5 mmol/L (ref 3.5–5.1)
Sodium: 148 mmol/L — ABNORMAL HIGH (ref 135–145)

## 2018-06-11 NOTE — Progress Notes (Signed)
Patient transferred to 5w bed 11. Husband at bedside.

## 2018-06-11 NOTE — Progress Notes (Signed)
PROGRESS NOTE    Courtney Beard  JXB:[id number] DOB: [birth year] DOA: 05/23/2018 PCP: Merri Brunette, MD  Outpatient Specialists:   Brief Narrative:  Patient is a 67 year old lady with history of depression, who suffered a cardiac arrest complicated by aspiration pneumonia and ARDS due to presumed intentional overdose of benzodiazepines and muscle relaxants.  Patient was intubated on October 12 and extubated on the 28th.  Following extubation, patient had significant secretions.  Patient is on IV vancomycin and meropenem.  Secretions and mental status/confusion seem to be improving.   Assessment & Plan:   Active Problems:   Overdose   Acute hypoxemic respiratory failure (HCC)   Aspiration pneumonia of right lung due to gastric secretions (HCC)  Acute hypoxemic respiratory failure/ARDS: Resolved significantly. Patient has been extubated. Secretions are improving. Complete course of antibiotics for possible pneumonia/tracheitis.  Pneumonia/tracheitis: Complete course of antibiotics. Patient seems to be improving clinically.  Acute encephalopathy, likely combined toxic and metabolic: Continue to treat the primary cause  Drug overdose: Consult psychiatry when patient is medically optimized.    DVT prophylaxis: Subcu Lovenox Code Status: Full code Family Communication:  Disposition Plan: Will depend on hospital course   Consultants:   Transfer from the ICU team  Procedures:   Intubation and extubation  Antimicrobials:   IV vancomycin  IV meropenem   Subjective: No significant history from the patient Intermittent episodes of confusion persist  Objective: Vitals:   06/11/18 1225 06/11/18 1230 06/11/18 1300 06/11/18 1330  BP:  137/73 126/86 136/81  Pulse:  (!) 116 (!) 116 (!) 115  Resp:  (!) 27 (!) 21 (!) 29  Temp: 99.5 F (37.5 C)     TempSrc: Oral     SpO2:  96% 97% 97%  Weight:      Height:        Intake/Output Summary (Last 24 hours) at  06/11/2018 1420 Last data filed at 06/11/2018 1300 Gross per 24 hour  Intake 2615.19 ml  Output 1550 ml  Net 1065.19 ml   Filed Weights   06/09/18 0413 06/10/18 0500 06/11/18 0500  Weight: 49.5 kg 47.3 kg 46.9 kg    Examination:  General exam: Appears calm and comfortable.  Cachectic.  Intermittent episodes of confusion.  Seems weak. Respiratory system: Clear to auscultation. Respiratory effort normal. Cardiovascular system: S1 & S2 heard Gastrointestinal system: Abdomen is nondistended, soft and nontender. No organomegaly or masses felt. Normal bowel sounds heard. Central nervous system: Awake.  Patient moves all limbs.   Extremities: No leg edema.    Data Reviewed: I have personally reviewed following labs and imaging studies  CBC: Recent Labs  Lab 06/05/18 0530 06/06/18 0504 06/07/18 0235 06/07/18 1157 06/09/18 0857 06/10/18 0412 06/11/18 0519  WBC 28.0* 24.7* 16.8* 14.9* 19.3* 22.7* 22.0*  NEUTROABS 23.1* 21.2* 13.3* 11.3*  --   --   --   HGB 7.7* 7.6* 7.6* 7.3* 7.4* 7.3* 7.7*  HCT 25.1* 24.8* 23.5* 22.8* 24.1* 24.0* 25.5*  MCV 80.2 79.0* 79.9* 80.6 78.8* 81.1 80.4  PLT 984* 1,042* 950* 871* 917* 767* 895*   Basic Metabolic Panel: Recent Labs  Lab 06/05/18 0530  06/07/18 0235 06/08/18 0404 06/09/18 0857 06/10/18 0412 06/11/18 0519  NA 144   < > 144 142 145 149* 148*  K 4.2   < > 3.4* 4.1 3.3* 4.0 3.5  CL 105   < > 104 110 107 111 111  CO2 31   < > 31 29 29 31 27   GLUCOSE 118*   < >  133* 102* 141* 125* 153*  BUN 28*   < > 38* 32* 24* 26* 26*  CREATININE 0.63   < > 0.59 0.61 0.66 0.59 0.66  CALCIUM 9.2   < > 9.8 8.9 9.4 9.3 9.4  MG 2.4  --   --   --   --   --   --   PHOS 3.7  --   --   --   --   --   --    < > = values in this interval not displayed.   GFR: Estimated Creatinine Clearance: 51.9 mL/min (by C-G formula based on SCr of 0.66 mg/dL). Liver Function Tests: No results for input(s): AST, ALT, ALKPHOS, BILITOT, PROT, ALBUMIN in the last 168  hours. No results for input(s): LIPASE, AMYLASE in the last 168 hours. No results for input(s): AMMONIA in the last 168 hours. Coagulation Profile: No results for input(s): INR, PROTIME in the last 168 hours. Cardiac Enzymes: No results for input(s): CKTOTAL, CKMB, CKMBINDEX, TROPONINI in the last 168 hours. BNP (last 3 results) No results for input(s): PROBNP in the last 8760 hours. HbA1C: No results for input(s): HGBA1C in the last 72 hours. CBG: Recent Labs  Lab 06/08/18 2029 06/09/18 0014 06/09/18 0815 06/09/18 1225 06/09/18 1635  GLUCAP 119* 127* 135* 131* 119*   Lipid Profile: No results for input(s): CHOL, HDL, LDLCALC, TRIG, CHOLHDL, LDLDIRECT in the last 72 hours. Thyroid Function Tests: No results for input(s): TSH, T4TOTAL, FREET4, T3FREE, THYROIDAB in the last 72 hours. Anemia Panel: No results for input(s): VITAMINB12, FOLATE, FERRITIN, TIBC, IRON, RETICCTPCT in the last 72 hours. Urine analysis:    Component Value Date/Time   COLORURINE YELLOW 05/23/2018 0719   APPEARANCEUR CLEAR 05/23/2018 0719   LABSPEC 1.016 05/23/2018 0719   PHURINE 5.0 05/23/2018 0719   GLUCOSEU NEGATIVE 05/23/2018 0719   HGBUR SMALL (A) 05/23/2018 0719   BILIRUBINUR NEGATIVE 05/23/2018 0719   KETONESUR NEGATIVE 05/23/2018 0719   PROTEINUR NEGATIVE 05/23/2018 0719   NITRITE NEGATIVE 05/23/2018 0719   LEUKOCYTESUR NEGATIVE 05/23/2018 0719   Sepsis Labs: @LABRCNTIP (procalcitonin:4,lacticidven:4)  ) Recent Results (from the past 240 hour(s))  Culture, blood (Routine X 2) w Reflex to ID Panel     Status: None   Collection Time: 06/04/18 11:15 AM  Result Value Ref Range Status   Specimen Description BLOOD LEFT ARM  Final   Special Requests   Final    BOTTLES DRAWN AEROBIC ONLY Blood Culture adequate volume   Culture   Final    NO GROWTH 5 DAYS Performed at [facility], [address] [place], [place] [address]    Report Status 06/09/2018 FINAL  Final  Culture, blood  (Routine X 2) w Reflex to ID Panel     Status: None   Collection Time: 06/04/18 11:23 AM  Result Value Ref Range Status   Specimen Description BLOOD LEFT ANTECUBITAL  Final   Special Requests   Final    BOTTLES DRAWN AEROBIC ONLY Blood Culture results may not be optimal due to an inadequate volume of blood received in culture bottles   Culture   Final    NO GROWTH 5 DAYS Performed at [facility], [address] [place], [place] [address]    Report Status 06/09/2018 FINAL  Final  Culture, respiratory (non-expectorated)     Status: None   Collection Time: 06/04/18 11:54 AM  Result Value Ref Range Status   Specimen Description TRACHEAL ASPIRATE  Final  Special Requests Normal  Final   Gram Stain   Final    MODERATE WBC PRESENT, PREDOMINANTLY PMN NO ORGANISMS SEEN Performed at [facility], [address] [place], [place] [address]    Culture FEW CANDIDA ALBICANS  Final   Report Status 06/06/2018 FINAL  Final         Radiology Studies: Dg Chest Port 1 View  Result Date: 06/10/2018 CLINICAL DATA:  Fall pneumonia EXAM: PORTABLE CHEST 1 VIEW COMPARISON:  06/08/2018 FINDINGS: Right-sided PICC line is again noted and stable. The endotracheal tube is been removed in the interval. The nasogastric catheter has been exchanged for a feeding tube. Patchy infiltrates are again noted bilaterally right greater than left. No bony abnormality is noted. IMPRESSION: Stable patchy infiltrates. Electronically Signed   By: Alcide Clever M.D.   On: 06/10/2018 10:55        Scheduled Meds: . Chlorhexidine Gluconate Cloth  6 each Topical Daily  . famotidine  20 mg Oral Daily  . folic acid  1 mg Per Tube Daily  . free water  30 mL Per Tube Q4H  . heparin  5,000 Units Subcutaneous Q8H  . mouth rinse  15 mL Mouth Rinse BID  . potassium chloride  40 mEq Per Tube BID  . QUEtiapine  50 mg Per Tube BID  . sodium chloride flush  10-40 mL Intracatheter Q12H  . thiamine  100 mg Per Tube  Daily   Continuous Infusions: . sodium chloride Stopped (05/31/18 2234)  . sodium chloride Stopped (06/11/18 1241)  . feeding supplement (JEVITY 1.2 CAL) 55 mL/hr at 06/11/18 0600  . lactated ringers Stopped (05/27/18 2000)  . meropenem (MERREM) IV Stopped (06/11/18 1206)  . valproate sodium 500 mg (06/11/18 0938)     LOS: 19 days    Time spent: 35 minutes    Berton Mount, MD  [facility] Pager #: [phone] 7PM-7AM contact night coverage as above

## 2018-06-11 NOTE — Evaluation (Signed)
Clinical/Bedside Swallow Evaluation Patient Details  Name: Courtney Beard MRN: [id number] Date of Birth: 1951/07/23  Today's Date: 06/11/2018 Time: SLP Start Time (ACUTE ONLY): 1451 SLP Stop Time (ACUTE ONLY): 1501 SLP Time Calculation (min) (ACUTE ONLY): 10 min  Past Medical History: History reviewed. No pertinent past medical history. Past Surgical History: History reviewed. No pertinent surgical history. HPI:  67 y/o female admitted 05/23/18 due to post cardiac arrest in setting of presumed intentional overdose with benzodiazepine and muscle relaxants.  Bystander CPR initiated, has aspiration pneumonia and ARDS. ETT 10/12-28. Has cortrak.  Since extubation she has remained confused and periodically agitated.  She has copious secretions but has difficulty expectorating them effectively and has required frequent suctioning NT suctioning.    Assessment / Plan / Recommendation Clinical Impression  Pt presents with a significant post-extubation dysphagia - voice is aphonic, cough is weak, she is having difficulty managing secretions and requires frequent NT suctioning.  Follows basic commands; no oral motor deficits; confusion is a barrier.  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  Pt has cortrak for nutrition.  Continue NPO; SLP will follow for readiness for instrumental swallow study.  D/W RN.  SLP Visit Diagnosis: Dysphagia, unspecified (R13.10)    Aspiration Risk  Severe aspiration risk    Diet Recommendation    NPO; FEES when appropriate      Other  Recommendations Oral Care Recommendations: Oral care QID   Follow up Recommendations        Frequency and Duration min 3x week  2 weeks       Prognosis Prognosis for Safe Diet Advancement: Good      Swallow Study   General Date of Onset: 05/23/18 HPI: 67 y/o female admitted 05/23/18 due to post cardiac arrest in setting of presumed intentional overdose with  benzodiazepine and muscle relaxants.  Bystander CPR initiated, has aspiration pneumonia and ARDS. ETT 10/12-28. Has cortrak.  Since extubation she has remained confused and periodically agitated.  She has copious secretions but has difficulty expectorating them effectively and has required frequent suctioning NT suctioning.  Type of Study: Bedside Swallow Evaluation Previous Swallow Assessment: no Diet Prior to this Study: NPO;NG Tube Temperature Spikes Noted: No Respiratory Status: Nasal cannula History of Recent Intubation: Yes Length of Intubations (days): 16 days Date extubated: 06/08/18 Behavior/Cognition: Alert;Confused Oral Cavity Assessment: Within Functional Limits Oral Care Completed by SLP: Recent completion by staff Oral Cavity - Dentition: Adequate natural dentition Vision: Functional for self-feeding Self-Feeding Abilities: Needs assist Patient Positioning: Upright in bed Baseline Vocal Quality: Aphonic Volitional Cough: Weak;Congested Volitional Swallow: Unable to elicit    Oral/Motor/Sensory Function Overall Oral Motor/Sensory Function: Within functional limits   Ice Chips Ice chips: Impaired Pharyngeal Phase Impairments: Throat Clearing - Immediate;Cough - Immediate   Thin Liquid Thin Liquid: Not tested    Nectar Thick Nectar Thick Liquid: Not tested   Honey Thick Honey Thick Liquid: Not tested   Puree Puree: Not tested   Solid     Solid: Not tested      Blenda Mounts Laurice 06/11/2018,3:06 PM    Marchelle Folks L. Samson Frederic, MA CCC/SLP Acute Rehabilitation Services Office number [phone] Pager [phone]

## 2018-06-12 DIAGNOSIS — R1310 Dysphagia, unspecified: Secondary | ICD-10-CM

## 2018-06-12 DIAGNOSIS — D62 Acute posthemorrhagic anemia: Secondary | ICD-10-CM

## 2018-06-12 DIAGNOSIS — E870 Hyperosmolality and hypernatremia: Secondary | ICD-10-CM

## 2018-06-12 HISTORY — DX: Acute posthemorrhagic anemia: D62

## 2018-06-12 LAB — BASIC METABOLIC PANEL
Anion gap: 6 (ref 5–15)
BUN: 33 mg/dL — AB (ref 8–23)
CHLORIDE: 116 mmol/L — AB (ref 98–111)
CO2: 30 mmol/L (ref 22–32)
CREATININE: 0.73 mg/dL (ref 0.44–1.00)
Calcium: 9.5 mg/dL (ref 8.9–10.3)
GFR calc Af Amer: >60 mL/min (ref >60)
GFR calc non Af Amer: >60 mL/min (ref >60)
GLUCOSE: 125 mg/dL — AB (ref 70–99)
Potassium: 3.2 mmol/L — ABNORMAL LOW (ref 3.5–5.1)
Sodium: 152 mmol/L — ABNORMAL HIGH (ref 135–145)

## 2018-06-12 LAB — CBC
HCT: 25.6 % — ABNORMAL LOW (ref 36.0–46.0)
HEMOGLOBIN: 7.6 g/dL — AB (ref 12.0–15.0)
MCH: 24 pg — AB (ref 26.0–34.0)
MCHC: 29.7 g/dL — AB (ref 30.0–36.0)
MCV: 80.8 fL (ref 80.0–100.0)
Platelets: 893 10*3/uL — ABNORMAL HIGH (ref 150–400)
RBC: 3.17 MIL/uL — ABNORMAL LOW (ref 3.87–5.11)
RDW: 15.6 % — ABNORMAL HIGH (ref 11.5–15.5)
WBC: 16 10*3/uL — ABNORMAL HIGH (ref 4.0–10.5)
nRBC: 0 % (ref 0.0–0.2)

## 2018-06-12 MED ORDER — SODIUM CHLORIDE 0.45 % IV SOLN
INTRAVENOUS | Status: DC
  Administered 2018-06-12 (×2): 1000 mL via INTRAVENOUS
  Administered 2018-06-14 – 2018-06-18 (×3): via INTRAVENOUS

## 2018-06-12 MED ORDER — FREE WATER
100.0000 mL | Status: DC
  Administered 2018-06-12 – 2018-06-13 (×6): 100 mL

## 2018-06-12 NOTE — Progress Notes (Signed)
PROGRESS NOTE        PATIENT DETAILS Name: Courtney Beard Age: 67 y.o. Sex: female Date of Birth: 12/15/1950 Admit Date: 05/23/2018 Admitting Physician Reyes Ivan, MD ZOX:[id number], Sonny Masters, MD  Brief Narrative: Patient is a 67 y.o. female with history of EtOH use, anxiety/depression presented with a cardiac arrest in the setting of overdose and aspiration pneumonia resulting in ARDS and prolonged ventilator dependence.  Per H&P, patient's husband found the patient on day of admission unresponsive-covered in emesis with no palpable pulse- with empty bottles of Klonopin and Zanaflex.  Patient was managed in the ICU, was on broad-spectrum antimicrobial therapy-finally extubated on 10/28.  Further hospital course has been complicated by delirium, dysphagia requiring CorPak tube placement.  See below for further details.  Subjective: Seems to be much more alert this morning than compared to the past few days.  Follows commands.  Assessment/Plan: Septic shock with acute hypoxic respiratory failure with ARDS secondary to aspiration pneumonia/HCAP: Overall improved, sepsis pathophysiology has markedly improved-required prolonged ventilator dependence-extubated on 10/28.  Has completed a course of antimicrobial therapy.  Although Candida seen in sputum cultures-this is thought to be a colonization/contamination rather than a true infection.  Supportive care-titrate off oxygen as tolerated.  Acute toxic/metabolic encephalopathy: Multifactorial-secondary to hypoxia/sedatives/narcotics-slowly improving.  Continue Depakote/Seroquel-slowly minimize narcotics and benzodiazepines as much as possible.  Sinus tachycardia: Probably secondary to acute illness-numerous physiological stressors-anemia-mildly tachycardic this morning-supportive care.  Monitor in telemetry.  Hypernatremia: Appears to be mild-increase free water flushes-change to half-normal saline-recheck electrolytes  tomorrow.  Hypokalemia: Replete and recheck.  Dysphagia: Related to prolonged ventilator dependence/ET tube placement-remains n.p.o-cortak tube in place-speech therapy following.  Will await further recommendations.  Anemia: Secondary to critical illness-no evidence of blood loss-follow.  Transfuse if significant drop in hemoglobin.  Check anemia panel.  Thrombocytosis: Suspect reactive-but will check for iron deficiency.  Suspected intentional drug overdose: When we will optimize-we will consult psychiatry-bedside sitter remains in place.  DVT Prophylaxis: Prophylactic Heparin  Code Status: Full code  Family Communication: None at bedside  Disposition Plan: Remain inpatient  Antimicrobial agents: Anti-infectives (From admission, onward)   Start     Dose/Rate Route Frequency Ordered Stop   06/08/18 0100  vancomycin (VANCOCIN) IVPB 750 mg/150 ml premix     750 mg 150 mL/hr over 60 Minutes Intravenous Every 12 hours 06/07/18 1307 06/11/18 1341   06/06/18 0100  vancomycin (VANCOCIN) 500 mg in sodium chloride 0.9 % 100 mL IVPB  Status:  Discontinued     500 mg 100 mL/hr over 60 Minutes Intravenous Every 12 hours 06/05/18 1119 06/07/18 1307   06/05/18 1200  meropenem (MERREM) 1 g in sodium chloride 0.9 % 100 mL IVPB     1 g 200 mL/hr over 30 Minutes Intravenous Every 8 hours 06/05/18 1119 06/11/18 2229   06/05/18 1130  vancomycin (VANCOCIN) IVPB 1000 mg/200 mL premix     1,000 mg 200 mL/hr over 60 Minutes Intravenous  Once 06/05/18 1119 06/05/18 1330   05/31/18 1000  ceFEPIme (MAXIPIME) 2 g in sodium chloride 0.9 % 100 mL IVPB  Status:  Discontinued     2 g 200 mL/hr over 30 Minutes Intravenous Every 12 hours 05/31/18 0903 06/05/18 1109   05/31/18 1000  vancomycin (VANCOCIN) 500 mg in sodium chloride 0.9 % 100 mL IVPB  Status:  Discontinued  500 mg 100 mL/hr over 60 Minutes Intravenous Every 12 hours 05/31/18 0903 06/02/18 1039   05/26/18 1300  cefTRIAXone (ROCEPHIN) 1 g in  sodium chloride 0.9 % 100 mL IVPB  Status:  Discontinued     1 g 200 mL/hr over 30 Minutes Intravenous Every 24 hours 05/26/18 1031 05/31/18 0840   05/25/18 2200  vancomycin (VANCOCIN) IVPB 750 mg/150 ml premix  Status:  Discontinued     750 mg 150 mL/hr over 60 Minutes Intravenous Every 12 hours 05/25/18 1136 05/25/18 1348   05/24/18 2200  vancomycin (VANCOCIN) 500 mg in sodium chloride 0.9 % 100 mL IVPB  Status:  Discontinued     500 mg 100 mL/hr over 60 Minutes Intravenous Every 12 hours 05/24/18 0953 05/25/18 1136   05/24/18 1100  vancomycin (VANCOCIN) IVPB 1000 mg/200 mL premix     1,000 mg 200 mL/hr over 60 Minutes Intravenous  Once 05/24/18 0953 05/24/18 1109   05/23/18 0715  piperacillin-tazobactam (ZOSYN) IVPB 3.375 g  Status:  Discontinued     3.375 g 12.5 mL/hr over 240 Minutes Intravenous Every 8 hours 05/23/18 0706 05/26/18 0943      Procedures: ETT 10/12>>10/28 PICC 10/21  CONSULTS:  pulmonary/intensive care  Time spent: 35 minutes-Greater than 50% of this time was spent in counseling, explanation of diagnosis, planning of further management, and coordination of care.  MEDICATIONS: Scheduled Meds: . Chlorhexidine Gluconate Cloth  6 each Topical Daily  . famotidine  20 mg Oral Daily  . folic acid  1 mg Per Tube Daily  . free water  100 mL Per Tube Q4H  . heparin  5,000 Units Subcutaneous Q8H  . mouth rinse  15 mL Mouth Rinse BID  . potassium chloride  40 mEq Per Tube BID  . QUEtiapine  50 mg Per Tube BID  . sodium chloride flush  10-40 mL Intracatheter Q12H  . thiamine  100 mg Per Tube Daily   Continuous Infusions: . sodium chloride 1,000 mL (06/12/18 0756)  . feeding supplement (JEVITY 1.2 CAL) 1,000 mL (06/11/18 2159)  . valproate sodium 500 mg (06/12/18 1006)   PRN Meds:.acetaminophen, albuterol, bisacodyl, guaiFENesin, HYDROmorphone (DILAUDID) injection, labetalol, lip balm, LORazepam, polyethylene glycol, sennosides, sodium chloride flush   PHYSICAL  EXAM: Vital signs: Vitals:   06/11/18 1955 06/11/18 2355 06/12/18 0400 06/12/18 0700  BP: (!) 146/90 135/86 137/86 (!) 144/88  Pulse: (!) 112 (!) 104 (!) 103 (!) 111  Resp: (!) 24 (!) 26 (!) 27 (!) 28  Temp: 98.5 F (36.9 C) 98.4 F (36.9 C) 98.5 F (36.9 C) 97.8 F (36.6 C)  TempSrc: Oral Oral Oral Oral  SpO2: 99% 100% 98% 100%  Weight:      Height:       Filed Weights   06/09/18 0413 06/10/18 0500 06/11/18 0500  Weight: 49.5 kg 47.3 kg 46.9 kg   Body mass index is 18.91 kg/m.   General appearance :Awake, alert, hoarse raspy voice-follows most of my commands. Eyes:,no scleral icterus.a HEENT: Atraumatic and Normocephalic Neck: supple Resp:Good air entry bilaterally, no added sounds  CVS: S1 S2 regular, no murmurs.  GI: Bowel sounds present, Non tender and not distended with no gaurding, rigidity or rebound.No organomegaly Extremities: B/L Lower Ext shows no edema, both legs are warm to touch Neurology: Nonfocal but appears to have severe generalized weakness. Musculoskeletal:No digital cyanosis Skin:No Rash, warm and dry Wounds:N/A  I have personally reviewed following labs and imaging studies  LABORATORY DATA: CBC: Recent Labs  Lab 06/06/18  5409 06/07/18 0235 06/07/18 1157 06/09/18 0857 06/10/18 0412 06/11/18 0519 06/12/18 0355  WBC 24.7* 16.8* 14.9* 19.3* 22.7* 22.0* 16.0*  NEUTROABS 21.2* 13.3* 11.3*  --   --   --   --   HGB 7.6* 7.6* 7.3* 7.4* 7.3* 7.7* 7.6*  HCT 24.8* 23.5* 22.8* 24.1* 24.0* 25.5* 25.6*  MCV 79.0* 79.9* 80.6 78.8* 81.1 80.4 80.8  PLT 1,042* 950* 871* 917* 767* 895* 893*    Basic Metabolic Panel: Recent Labs  Lab 06/08/18 0404 06/09/18 0857 06/10/18 0412 06/11/18 0519 06/12/18 0355  NA 142 145 149* 148* 152*  K 4.1 3.3* 4.0 3.5 3.2*  CL 110 107 111 111 116*  CO2 29 29 31 27 30   GLUCOSE 102* 141* 125* 153* 125*  BUN 32* 24* 26* 26* 33*  CREATININE 0.61 0.66 0.59 0.66 0.73  CALCIUM 8.9 9.4 9.3 9.4 9.5    GFR: Estimated  Creatinine Clearance: 51.9 mL/min (by C-G formula based on SCr of 0.73 mg/dL).  Liver Function Tests: No results for input(s): AST, ALT, ALKPHOS, BILITOT, PROT, ALBUMIN in the last 168 hours. No results for input(s): LIPASE, AMYLASE in the last 168 hours. No results for input(s): AMMONIA in the last 168 hours.  Coagulation Profile: No results for input(s): INR, PROTIME in the last 168 hours.  Cardiac Enzymes: No results for input(s): CKTOTAL, CKMB, CKMBINDEX, TROPONINI in the last 168 hours.  BNP (last 3 results) No results for input(s): PROBNP in the last 8760 hours.  HbA1C: No results for input(s): HGBA1C in the last 72 hours.  CBG: Recent Labs  Lab 06/08/18 2029 06/09/18 0014 06/09/18 0815 06/09/18 1225 06/09/18 1635  GLUCAP 119* 127* 135* 131* 119*    Lipid Profile: No results for input(s): CHOL, HDL, LDLCALC, TRIG, CHOLHDL, LDLDIRECT in the last 72 hours.  Thyroid Function Tests: No results for input(s): TSH, T4TOTAL, FREET4, T3FREE, THYROIDAB in the last 72 hours.  Anemia Panel: No results for input(s): VITAMINB12, FOLATE, FERRITIN, TIBC, IRON, RETICCTPCT in the last 72 hours.  Urine analysis:    Component Value Date/Time   COLORURINE YELLOW 05/23/2018 0719   APPEARANCEUR CLEAR 05/23/2018 0719   LABSPEC 1.016 05/23/2018 0719   PHURINE 5.0 05/23/2018 0719   GLUCOSEU NEGATIVE 05/23/2018 0719   HGBUR SMALL (A) 05/23/2018 0719   BILIRUBINUR NEGATIVE 05/23/2018 0719   KETONESUR NEGATIVE 05/23/2018 0719   PROTEINUR NEGATIVE 05/23/2018 0719   NITRITE NEGATIVE 05/23/2018 0719   LEUKOCYTESUR NEGATIVE 05/23/2018 0719    Sepsis Labs: Lactic Acid, Venous    Component Value Date/Time   LATICACIDVEN 1.4 05/23/2018 1052    MICROBIOLOGY: Recent Results (from the past 240 hour(s))  Culture, blood (Routine X 2) w Reflex to ID Panel     Status: None   Collection Time: 06/04/18 11:15 AM  Result Value Ref Range Status   Specimen Description BLOOD LEFT ARM  Final    Special Requests   Final    BOTTLES DRAWN AEROBIC ONLY Blood Culture adequate volume   Culture   Final    NO GROWTH 5 DAYS Performed at [facility], [address] [place], [place] [address]    Report Status 06/09/2018 FINAL  Final  Culture, blood (Routine X 2) w Reflex to ID Panel     Status: None   Collection Time: 06/04/18 11:23 AM  Result Value Ref Range Status   Specimen Description BLOOD LEFT ANTECUBITAL  Final   Special Requests   Final    BOTTLES DRAWN AEROBIC ONLY Blood  Culture results may not be optimal due to an inadequate volume of blood received in culture bottles   Culture   Final    NO GROWTH 5 DAYS Performed at [facility], [address], [place], [place] [address]    Report Status 06/09/2018 FINAL  Final  Culture, respiratory (non-expectorated)     Status: None   Collection Time: 06/04/18 11:54 AM  Result Value Ref Range Status   Specimen Description TRACHEAL ASPIRATE  Final   Special Requests Normal  Final   Gram Stain   Final    MODERATE WBC PRESENT, PREDOMINANTLY PMN NO ORGANISMS SEEN Performed at [facility], [address] [place], [place] [address]    Culture FEW CANDIDA ALBICANS  Final   Report Status 06/06/2018 FINAL  Final    RADIOLOGY STUDIES/RESULTS: Dg Chest 1 View  Result Date: 05/23/2018 CLINICAL DATA:  Aspiration pneumonia. Acute hypoxemic respiratory failure. Central line placement. EXAM: CHEST  1 VIEW COMPARISON:  Prior today FINDINGS: A new right jugular central venous catheter is seen with tip overlying the distal SVC. Endotracheal tube and nasogastric tube remain in appropriate position. No pneumothorax visualized. Worsening airspace disease is seen throughout the right lung. Mild airspace disease in the medial left lower lung also shows mild worsening. IMPRESSION: New right jugular central venous catheter in appropriate position. No pneumothorax visualized. Interval worsening of airspace disease throughout the right  lung and in the medial left lower lobe. Electronically Signed   By: Myles Rosenthal M.D.   On: 05/23/2018 23:07   Ct Head Wo Contrast  Result Date: 05/23/2018 CLINICAL DATA:  Pulseless arrest, status post CPR. EXAM: CT HEAD WITHOUT CONTRAST CT CERVICAL SPINE WITHOUT CONTRAST TECHNIQUE: Multidetector CT imaging of the head and cervical spine was performed following the standard protocol without intravenous contrast. Multiplanar CT image reconstructions of the cervical spine were also generated. COMPARISON:  None. FINDINGS: CT HEAD FINDINGS Brain: There is no mass, hemorrhage or extra-axial collection. The size and configuration of the ventricles and extra-axial CSF spaces are normal. There is no acute or chronic infarction. The brain parenchyma is normal. Vascular: No abnormal hyperdensity of the major intracranial arteries or dural venous sinuses. No intracranial atherosclerosis. Skull: The visualized skull base, calvarium and extracranial soft tissues are normal. Sinuses/Orbits: Fluid layering in the maxillary and sphenoid sinuses. The orbits are normal. CT CERVICAL SPINE FINDINGS Alignment: There is grade 1 anterolisthesis at C3-4, C4-5 and C5-6 secondary to facet hypertrophy. There is normal facet alignment. Lateral masses of C1 and C2 and the occipital condyles are aligned. Skull base and vertebrae: No acute fracture. Soft tissues and spinal canal: No prevertebral fluid or swelling. No visible canal hematoma. Disc levels: No advanced spinal canal or neural foraminal stenosis. Upper chest: Large area of consolidation in the right upper lobe. Other: Patient is intubated with the endotracheal tube tip beyond the field of view. IMPRESSION: 1. No acute intracranial abnormality. 2. No acute fracture of the cervical spine. 3. Large area of right upper lobe consolidation, possibly secondary to aspiration. Electronically Signed   By: Deatra Robinson M.D.   On: 05/23/2018 06:34   Ct Cervical Spine Wo Contrast  Result  Date: 05/23/2018 CLINICAL DATA:  Pulseless arrest, status post CPR. EXAM: CT HEAD WITHOUT CONTRAST CT CERVICAL SPINE WITHOUT CONTRAST TECHNIQUE: Multidetector CT imaging of the head and cervical spine was performed following the standard protocol without intravenous contrast. Multiplanar CT image reconstructions of the cervical spine were also generated. COMPARISON:  None. FINDINGS: CT HEAD FINDINGS Brain: There is no mass, hemorrhage or extra-axial collection. The size and configuration of the ventricles and extra-axial CSF spaces are normal. There is no acute or chronic infarction. The brain parenchyma is normal. Vascular: No abnormal hyperdensity of the major intracranial arteries or dural venous sinuses. No intracranial atherosclerosis. Skull: The visualized skull base, calvarium and extracranial soft tissues are normal. Sinuses/Orbits: Fluid layering in the maxillary and sphenoid sinuses. The orbits are normal. CT CERVICAL SPINE FINDINGS Alignment: There is grade 1 anterolisthesis at C3-4, C4-5 and C5-6 secondary to facet hypertrophy. There is normal facet alignment. Lateral masses of C1 and C2 and the occipital condyles are aligned. Skull base and vertebrae: No acute fracture. Soft tissues and spinal canal: No prevertebral fluid or swelling. No visible canal hematoma. Disc levels: No advanced spinal canal or neural foraminal stenosis. Upper chest: Large area of consolidation in the right upper lobe. Other: Patient is intubated with the endotracheal tube tip beyond the field of view. IMPRESSION: 1. No acute intracranial abnormality. 2. No acute fracture of the cervical spine. 3. Large area of right upper lobe consolidation, possibly secondary to aspiration. Electronically Signed   By: Deatra Robinson M.D.   On: 05/23/2018 06:34   Dg Chest Port 1 View  Result Date: 06/10/2018 CLINICAL DATA:  Fall pneumonia EXAM: PORTABLE CHEST 1 VIEW COMPARISON:  06/08/2018 FINDINGS: Right-sided PICC line is again noted and  stable. The endotracheal tube is been removed in the interval. The nasogastric catheter has been exchanged for a feeding tube. Patchy infiltrates are again noted bilaterally right greater than left. No bony abnormality is noted. IMPRESSION: Stable patchy infiltrates. Electronically Signed   By: Alcide Clever M.D.   On: 06/10/2018 10:55   Dg Chest Port 1 View  Result Date: 06/08/2018 CLINICAL DATA:  Acute respiratory failure with hypoxemia EXAM: PORTABLE CHEST 1 VIEW COMPARISON:  06/07/2018 FINDINGS: Cardiac shadow is stable. Endotracheal tube, nasogastric catheter and right-sided PICC line are again seen and stable. Patchy infiltrates are again identified throughout both lungs right greater than left. No sizable effusion is seen. No bony abnormality is noted. IMPRESSION: Stable patchy infiltrates bilaterally right greater than left. Tubes and lines as described. Electronically Signed   By: Alcide Clever M.D.   On: 06/08/2018 07:24   Dg Chest Port 1 View  Result Date: 06/07/2018 CLINICAL DATA:  Acute respiratory failure with hypoxemia EXAM: PORTABLE CHEST 1 VIEW COMPARISON:  06/06/2018 FINDINGS: Multifocal patchy opacities, right lung predominant, grossly unchanged. No pleural effusion or pneumothorax. Endotracheal tube terminates 8 cm above the carina. Right arm PICC terminates at the cavoatrial junction. Enteric tube courses into the stomach. IMPRESSION: Multifocal pneumonia, right lobe predominant, unchanged. Endotracheal tube terminates 8 cm above the carina. Additional support apparatus as above. Electronically Signed   By: Charline Bills M.D.   On: 06/07/2018 08:36   Dg Chest Port 1 View  Result Date: 06/06/2018 CLINICAL DATA:  Acute respiratory failure.  Hypoxemia. EXAM: PORTABLE CHEST 1 VIEW COMPARISON:  June 05, 2018 FINDINGS: The ETT and right PICC line are stable. The NG tube terminates below today's film. Right greater than left patchy pulmonary infiltrates are similar in the interval.  No other interval changes. IMPRESSION: Multifocal pneumonia remains, similar in the interval. Stable support apparatus. Electronically Signed   By: Gerome Sam III M.D   On: 06/06/2018 08:42   Dg Chest Port 1 View  Result Date: 06/05/2018 CLINICAL DATA:  Respiratory fail, aspiration pneumonia EXAM: PORTABLE CHEST 1  VIEW COMPARISON:  Portable exam 0429 hours compared to 06/04/2018 FINDINGS: Tip of endotracheal tube projects 4.3 cm above carina. Nasogastric tube extends into stomach. RIGHT arm PICC line tip projects over SVC. Normal heart size mediastinal contours. Patchy BILATERAL airspace infiltrates consistent with multifocal pneumonia, RIGHT greater than LEFT. Infiltrates appear mildly improved versus previous exam. No pleural effusion or pneumothorax. IMPRESSION: Multifocal pneumonia, with mildly improved aeration versus previous exam. Electronically Signed   By: Ulyses Southward M.D.   On: 06/05/2018 10:10   Dg Chest Port 1 View  Result Date: 06/04/2018 CLINICAL DATA:  67 year old with respiratory failure. EXAM: PORTABLE CHEST 1 VIEW COMPARISON:  06/03/2018 FINDINGS: Endotracheal tube is 3.3 cm above the carina. Nasogastric tube extends into the abdomen. PICC line tip in the lower SVC region. Patchy parenchymal disease and airspace disease in the right lung has slightly improved, particularly at the right lung base. Probable right pleural effusion. However, there are new or worsening patchy airspace densities along the periphery of the left lung. Heart size is within normal limits and stable. Negative for a pneumothorax. IMPRESSION: 1. New or worsening airspace disease in left lung. Findings are concerning for left lung pneumonia. 2. Persistent patchy airspace disease in the right lung is most compatible with pneumonia with slightly improved aeration at the right lung base. 3. Probable small pleural effusions. 4. Visualized support apparatuses are appropriately positioned. Electronically Signed   By:  Richarda Overlie M.D.   On: 06/04/2018 10:13   Dg Chest Port 1 View  Result Date: 06/03/2018 CLINICAL DATA:  Acute respiratory failure with hypoxemia EXAM: PORTABLE CHEST 1 VIEW COMPARISON:  06/02/2018 FINDINGS: The patient is rotated to the right on today's radiograph, reducing diagnostic sensitivity and specificity. Endotracheal tube tip 2.8 cm above the carina, satisfactorily position. A nasogastric tube extends below the inferior margin of the radiograph. Right PICC line tip: Lower SVC. Continued extensive airspace opacity in the right lung, worsened at the right lung base compared to prior. There is some mild bandlike density in the left mid lung, increased in conspicuity. Heart size within normal limits. IMPRESSION: 1. Worsened airspace opacity at the right lung base with continued notable right upper lobe airspace opacity. 2. Mild increase in bandlike density in the left mid lung, probably atelectasis. 3. Support apparatus satisfactorily position. Electronically Signed   By: Gaylyn Rong M.D.   On: 06/03/2018 10:28   Dg Chest Port 1 View  Result Date: 06/02/2018 CLINICAL DATA:  Aspiration into the airway. EXAM: PORTABLE CHEST 1 VIEW COMPARISON:  Most recent comparison yesterday at 0507 hour FINDINGS: Patient is significantly rotated. Endotracheal tube approximately 15 mm from the carina. Enteric tube in place with tip and side-port below the diaphragm. Right internal jugular central venous catheter in the mid SVC. Right upper extremity PICC with tip in the distal SVC. Patchy opacities throughout the right lung, most confluent in the lower lobe without significant interval change allowing for differences in technique. Slight improvement in left lung base opacity. Heart size and mediastinal contours are grossly unchanged, partially obscured by rotation. No pneumothorax. IMPRESSION: 1. No significant change in patchy opacities throughout the right lung allowing for differences in technique and  rotation. 2. New right upper extremity PICC with tip in the distal SVC. Borderline low positioning of endotracheal tube tip 15 mm from the carina. 3. Slight improving left basilar aeration. Electronically Signed   By: Narda Rutherford M.D.   On: 06/02/2018 02:36   Dg Chest [facility] 1 View  Result  Date: 06/01/2018 CLINICAL DATA:  67 year old female with pneumonia. Subsequent encounter. EXAM: PORTABLE CHEST 1 VIEW COMPARISON:  05/30/2018 chest x-ray. FINDINGS: Endotracheal tube tip 3.9 cm above the carina. Right central line tip mid superior vena cava level. No pneumothorax detected. Nasogastric tube courses below the diaphragm. Tip is not included on the present exam. Minimal improvement of consolidation right lower lobe and patchy consolidation right upper lobe which may represent infectious infiltrate in proper clinical setting. Asymmetric pulmonary vascular congestion/mild pulmonary edema. Heart size within normal limits. IMPRESSION: 1. Minimal improvement of right lower lobe consolidation and patchy consolidation right upper lobe suggestive of result of pneumonia. 2. Asymmetric mild pulmonary vascular congestion/pulmonary edema without change. Electronically Signed   By: Lacy Duverney M.D.   On: 06/01/2018 07:22   Dg Chest Port 1 View  Result Date: 05/30/2018 CLINICAL DATA:  Aspiration pneumonia. EXAM: PORTABLE CHEST 1 VIEW COMPARISON:  One-view chest x-ray 05/29/2018 FINDINGS: Heart size is normal. Endotracheal tube terminates 3 cm above the carina. NG tube is in the stomach. Right IJ line is stable. Right lower lobe airspace disease is again seen. Mild edema is slightly increased. Minimal left basilar atelectasis is stable. IMPRESSION: 1. Persistent right lower lobe airspace disease compatible with pneumonia or aspiration. 2. Slight progression in edema and left basilar atelectasis. Electronically Signed   By: Marin Roberts M.D.   On: 05/30/2018 07:42   Dg Chest Port 1 View  Result Date:  05/29/2018 CLINICAL DATA:  Respiratory failure EXAM: PORTABLE CHEST 1 VIEW COMPARISON:  05/28/2018 FINDINGS: Cardiac shadow is stable. Right jugular central line, endotracheal tube and nasogastric catheter are again seen and stable. The left lung is predominantly clear although some left retrocardiac infiltrate is seen. Diffuse infiltrates are noted throughout the right lung stable in appearance when compared with the prior exam. Small right pleural effusion is noted as well. IMPRESSION: Stable bilateral infiltrates right greater than left with associated right effusion. Tubes and lines as described. Electronically Signed   By: Alcide Clever M.D.   On: 05/29/2018 07:07   Dg Chest Port 1 View  Result Date: 05/28/2018 CLINICAL DATA:  Pneumonia. EXAM: PORTABLE CHEST 1 VIEW COMPARISON:  05/28/2018. FINDINGS: Endotracheal tube, NG tube, right IJ line in stable position. Heart size stable. Diffuse bilateral pulmonary infiltrates, particular on the right side, and right-sided pleural effusion again noted. Similar findings noted on prior exam. IMPRESSION: 1.  Lines and tubes stable position. 2. Diffuse bilateral pulmonary infiltrates, right side greater than left. Right-sided pleural effusion. Similar findings on prior exam. Electronically Signed   By: Maisie Fus  Register   On: 05/28/2018 11:47   Dg Chest Portable 1 View  Result Date: 05/28/2018 CLINICAL DATA:  ARDS, respiratory failure and aspiration pneumonia. EXAM: PORTABLE CHEST 1 VIEW COMPARISON:  05/27/2018 FINDINGS: Endotracheal tube remains with the tip projecting approximately 4 cm above the carina. Right jugular central line shows stable positioning with the tip in the lower SVC. Gastric decompression tube extends into the stomach. Relatively stable airspace consolidation bilaterally, right greater than left with potentially mildly improved aeration at the right base. No significant pleural effusions. No pneumothorax. Stable heart size. IMPRESSION:  Relatively stable bilateral airspace consolidation with potentially slightly improved aeration at the right base. Electronically Signed   By: Irish Lack M.D.   On: 05/28/2018 07:53   Dg Chest Port 1 View  Result Date: 05/27/2018 CLINICAL DATA:  Shortness of breath, ARDS EXAM: PORTABLE CHEST 1 VIEW COMPARISON:  Portable chest x-ray of 05/26/2018 FINDINGS: There  is slightly better aeration of the right upper lung. There appear to be bilateral pleural effusions. Opacity at the right lung base remains most likely due to pneumonia. The tip of the endotracheal tube is approximatelymm 2.0 cm above the carina. NG tube extends into the stomach. IMPRESSION: 1. Tip of endotracheal tube 2.0 cm above the carina. 2. Slightly.  Aeration of the right upper lung. 3. Bilateral pleural effusions and basilar atelectasis. Cannot exclude pneumonia particularly the right lung base. Electronically Signed   By: Dwyane Dee M.D.   On: 05/27/2018 10:08   Dg Chest Port 1 View  Result Date: 05/26/2018 CLINICAL DATA:  Acute respiratory failure EXAM: PORTABLE CHEST 1 VIEW COMPARISON:  Portable chest x-ray of May 25, 2018 FINDINGS: There is developed further opacification of much of the right lung. The greatest density is inferiorly. On the left there is increasing interstitial and alveolar opacity in the mid and lower lung. The heart borders are partially obscured. The heart is top-normal in size. The pulmonary vascularity is not clearly engorged. The endotracheal tube tip is at the carina. The esophagogastric tube tip and proximal port project below the GE junction. The right internal jugular venous catheter tip projects over the midportion of the SVC. IMPRESSION: Progressive opacification of both lungs worrisome for widespread pneumonia. Low positioning of the endotracheal tube. Withdrawal by 2 cm would help assure that the tube does not migrate into the right mainstem bronchus with patient movement. Electronically Signed    By: David  [place] M.D.   On: 05/26/2018 08:40   Dg Chest Port 1 View  Result Date: 05/25/2018 CLINICAL DATA:  Acute respiratory failure EXAM: PORTABLE CHEST 1 VIEW COMPARISON:  05/23/2018 FINDINGS: Endotracheal tube, nasogastric catheter and right jugular central line are again seen. Cardiac shadow is mildly enlarged but stable. Diffuse right-sided infiltrate is noted with some increasing consolidation in the lateral right lung base. Left retrocardiac infiltrate is noted as well. No pneumothorax is identified. IMPRESSION: Bilateral infiltrates right greater than left, increasing in the right lung base. Electronically Signed   By: Alcide Clever M.D.   On: 05/25/2018 07:24   Dg Chest Port 1 View  Result Date: 05/23/2018 CLINICAL DATA:  Suspected DVT.  Endotracheal tube. EXAM: PORTABLE CHEST 1 VIEW COMPARISON:  One-view chest x-ray 05/23/2018 at 4:13 a.m. FINDINGS: Heart size is normal. Pacing wires are in place. NG tube courses off the inferior border of the film. The endotracheal tube has been pulled back, now terminating 4.5 cm above the carina. Right middle lobe and lower lobe airspace disease is again seen. Asymmetric edema is present on the right. IMPRESSION: 1. Interval adjustment of endotracheal tube, now in satisfactory position. 2. Right middle and lower lobe airspace disease. This is concerning for infection versus aspiration. Underlying mass is not excluded. Electronically Signed   By: Marin Roberts M.D.   On: 05/23/2018 07:28   Dg Chest Portable 1 View  Result Date: 05/23/2018 CLINICAL DATA:  Intubation EXAM: PORTABLE CHEST 1 VIEW COMPARISON:  None. FINDINGS: Endotracheal tube tip is within the proximal right mainstem bronchus. This should be retracted by 4.5 cm to place it at the level of the clavicular heads. There is a large area of right parahilar consolidation. The left lung is clear. No pleural effusion or pneumothorax. The nasogastric tube side port is below the diaphragm but  not clearly visualized. A dedicated abdominal radiograph may be helpful. IMPRESSION: 1. Endotracheal tube tip in the proximal right mainstem bronchus. Retraction by 4 cm recommended.  2. Nasogastric tube side port below the diaphragm but not clearly visualized. Dedicated abdominal radiograph may be helpful. 3. Large area of right parahilar and medial right lung apex consolidation which may indicate infection or neoplastic process. Chest CT or Followup PA and lateral chest X-ray is recommended in 3-4 weeks following trial of antibiotic therapy to ensure resolution and exclude underlying malignancy. These results were called by telephone at the time of interpretation on 05/23/2018 at 5:18 am to Dr. Jaci Carrel , who verbally acknowledged these results. Electronically Signed   By: Deatra Robinson M.D.   On: 05/23/2018 05:18   Dg Abd Portable 1v  Result Date: 06/08/2018 CLINICAL DATA:  Nasogastric tube placement. EXAM: PORTABLE ABDOMEN - 1 VIEW COMPARISON:  Abdominal radiograph May 23, 2018 FINDINGS: Feeding tube tip projects in second portion of duodenum. Included bowel gas pattern is nondilated and nonobstructive. No intra-abdominal mass effect. Reticulonodular densities and alveolar airspace opacities included lung bases. Soft tissue planes and included osseous structures are non suspicious. IMPRESSION: Feeding tube tip projects in duodenum. Electronically Signed   By: Awilda Metro M.D.   On: 06/08/2018 16:09   Dg Abd Portable 1v  Result Date: 05/23/2018 CLINICAL DATA:  OG tube placement. EXAM: PORTABLE ABDOMEN - 1 VIEW COMPARISON:  One-view chest x-ray of the same day. FINDINGS: OG tube is now in place. The side port is in the mid stomach. Pacing leads are stable. Right middle and lower lobe pneumonia are stable. IMPRESSION: 1. Satisfactory positioning of OG tube in the stomach. 2. Right lower lobe pneumonia. Electronically Signed   By: Marin Roberts M.D.   On: 05/23/2018 10:44   [place]  Ekg Site Rite  Result Date: 06/01/2018 If Site Rite image not attached, placement could not be confirmed due to current cardiac rhythm.    LOS: 20 days   Jeoffrey Massed, MD  [facility]  If 7PM-7AM, please contact night-coverage  Please page via [email or web address]-click on MD name and type text message  06/12/2018, 10:44 AM

## 2018-06-12 NOTE — Progress Notes (Signed)
Foley and rectal tube were D/C per MD order. Patient tolerated well. Will continue to monitor.

## 2018-06-12 NOTE — Evaluation (Signed)
Occupational Therapy Evaluation Patient Details Name: Courtney Beard MRN: [id number] DOB: 06/01/1951 Today's Date: 06/12/2018    History of Present Illness Pt is a 67 y/o female admitted secondary to Cardiac arrest complicated by aspiration PNA and ARDS secondary to overdose. Pt was intubated on 10/12 and was extubated 10/28. PMH includes Anxiety, Depression, ETOH abuse.    Clinical Impression   This 67 y/o female presents with the above. At baseline pt is independent with ADLs and functional mobility, was active. Pt presenting with significant weakness and decreased activity tolerance. Pt requiring modA for bed mobility and to perform sit<>stand using HHA. Unable to attempt further mobility as pt noted to be incontinent of bowel after return to sitting from standing at EOB and pt with significant fatigue after minimal activity. Pt requiring totalA for peri-care at bed level, min-modA for UB ADL and max-totalA for LB ADL. Pt will benefit from post-acute rehab services. Given pt's PLOF may be a good candidate for CIR level therapy services to progress pt towards PLOF, though will continue to assess as pt progresses for rehab needs (CIR vs SNF setting). Will continue to follow acutely to progress pt towards established OT goals.     Follow Up Recommendations  Supervision/Assistance - 24 hour;Other (comment)(CIR vs SNF)    Equipment Recommendations  Other (comment)(TBD in next venue)           Precautions / Restrictions Precautions Precautions: Fall;Other (comment) Precaution Comments: suicide precautions, feeding tube Restrictions Weight Bearing Restrictions: No      Mobility Bed Mobility Overal bed mobility: Needs Assistance Bed Mobility: Supine to Sit;Sit to Supine     Supine to sit: Mod assist Sit to supine: Mod assist   General bed mobility comments: Mod A for trunk assist and LE assist throughout bed mobility.   Transfers Overall transfer level: Needs  assistance Equipment used: 1 person hand held assist Transfers: Sit to/from Stand Sit to Stand: Mod assist         General transfer comment: boosting assist to rise and steady from EOB with pt utilizing HHA on therapists elbows; pt able to maintain static standing with modA while NT changed fitted bed sheet    Balance Overall balance assessment: Needs assistance Sitting-balance support: No upper extremity supported;Feet supported Sitting balance-Leahy Scale: Poor Sitting balance - Comments: Reliant on minG-minA to maintain sitting balance. increased assist required with extended time sitting upright due to fatigue    Standing balance support: Bilateral upper extremity supported Standing balance-Leahy Scale: Poor Standing balance comment: reliant on UE support/external support from therapist                           ADL either performed or assessed with clinical judgement   ADL Overall ADL's : Needs assistance/impaired Eating/Feeding: NPO   Grooming: Min guard;Minimal assistance;Sitting   Upper Body Bathing: Minimal assistance;Sitting   Lower Body Bathing: Moderate assistance;Sit to/from stand   Upper Body Dressing : Minimal assistance;Sitting   Lower Body Dressing: Maximal assistance;Sit to/from stand Lower Body Dressing Details (indicate cue type and reason): requires totalA to don socks this session     Toileting- Clothing Manipulation and Hygiene: Total assistance;+2 for physical assistance;+2 for safety/equipment;Bed level Toileting - Clothing Manipulation Details (indicate cue type and reason): totalA for peri-care after incontinent BM at bed level       General ADL Comments: pt with significant weakness/fatigue; pt able to perform sit<>stand from EOB, prior to attempts for additional  sit<>stand to attempt further mobility noted pt to be incontinent of BM, due to pt with increasing fatigue while sitting EOB returned to supine for completion of peri-care                          Pertinent Vitals/Pain Pain Assessment: Faces Faces Pain Scale: No hurt Pain Intervention(s): Monitored during session     Hand Dominance     Extremity/Trunk Assessment Upper Extremity Assessment Upper Extremity Assessment: Generalized weakness   Lower Extremity Assessment Lower Extremity Assessment: Defer to PT evaluation   Cervical / Trunk Assessment Cervical / Trunk Assessment: Kyphotic   Communication Communication Communication: Expressive difficulties(soft spoken and slurred speech)   Cognition Arousal/Alertness: Awake/alert Behavior During Therapy: WFL for tasks assessed/performed Overall Cognitive Status: Impaired/Different from baseline Area of Impairment: Following commands;Safety/judgement;Problem solving                       Following Commands: Follows one step commands with increased time     Problem Solving: Slow processing;Decreased initiation;Requires verbal cues;Requires tactile cues General Comments: Pt soft spoken and with some slurred speech, so difficulty understanding pt at times. Pt overall able to follow one step commands, though increased difficulty with command following when fatigued; pt incontinent of bowel during session and unaware    General Comments  HR up to 120's with standing activity; pt's friend present during session (friend she works out with at [employer])    Exercises     Shoulder Instructions      Home Living Family/patient expects to be discharged to:: Private residence Living Arrangements: Spouse/significant other Available Help at Discharge: Family;Available 24 hours/day Type of Home: House Home Access: Stairs to enter [employer] of Steps: stated there was a few steps  Entrance Stairs-Rails: Right;Left Home Layout: One level               Home Equipment: None          Prior Functioning/Environment Level of Independence: Independent        Comments: was  active, going to the gym 4-5x/week        OT Problem List: Decreased strength;Decreased range of motion;Decreased activity tolerance;Impaired balance (sitting and/or standing);Decreased cognition;Decreased safety awareness      OT Treatment/Interventions: Self-care/ADL training;Therapeutic exercise;DME and/or AE instruction;Therapeutic activities;Cognitive remediation/compensation;Patient/family education;Balance training    OT Goals(Current goals can be found in the care plan section) Acute Rehab OT Goals Patient Stated Goal: regain strength, get back to independence OT Goal Formulation: With patient Time For Goal Achievement: 06/26/18 Potential to Achieve Goals: Good  OT Frequency: Min 2X/week   Barriers to D/C:            Co-evaluation              AM-PAC PT "6 Clicks" Daily Activity     Outcome Measure Help from another person eating meals?: Total(NPO) Help from another person taking care of personal grooming?: A Little Help from another person toileting, which includes using toliet, bedpan, or urinal?: Total Help from another person bathing (including washing, rinsing, drying)?: A Lot Help from another person to put on and taking off regular upper body clothing?: A Lot Help from another person to put on and taking off regular lower body clothing?: Total 6 Click Score: 10   End of Session Equipment Utilized During Treatment: Gait belt;Oxygen Nurse Communication: Mobility status  Activity Tolerance: Patient tolerated treatment well;Patient limited by fatigue Patient  left: in bed;with call bell/phone within reach;with nursing/sitter in room;with family/visitor present  OT Visit Diagnosis: Muscle weakness (generalized) (M62.81);Unsteadiness on feet (R26.81)                Time: 6433-2951 OT Time Calculation (min): 37 min Charges:  OT General Charges $OT Visit: 1 Visit OT Evaluation $OT Eval Moderate Complexity: 1 Mod OT Treatments $Self Care/Home Management :  8-22 mins  Marcy Siren, OT Supplemental Rehabilitation Services Pager [phone] Office [phone]   Orlando Penner 06/12/2018, 4:08 PM

## 2018-06-12 NOTE — Progress Notes (Signed)
  Speech Language Pathology Treatment: Dysphagia  Patient Details Name: Courtney Beard MRN: [id number] DOB: 30-Apr-1951 Today's Date: 06/12/2018 Time: 7829-5621 SLP Time Calculation (min) (ACUTE ONLY): 26 min  Assessment / Plan / Recommendation Clinical Impression  Pt seen for skilled ST treatment for dysphagia. Husband present at bedside; SLP answered several questions re: swallowing anatomy/physiology, impacts of prolonged intubation, and objective testing of swallow function. Pt lethargic today and requires total cues to participate in oral care. SLP removed thick, stringy yellow secretions from soft palate and base of tongue. Husband reports using sponges soaked with water to provide oral care. SLP explained use of suction toothettes to reduce aspiration risk. Pt accepted single ice chip with max cues; prolonged bolus formation noted, and she exhibited delayed coughing, suggestive of airway compromise/ glottal insufficiency. Continues to have copious secretions. Not yet appropriate for FEES from a mentation standpoint. Continue NPO with cortrak; will follow up for readiness for instrumental testing, likely Monday at the earliest as FEES not available on weekends.   HPI HPI: 67 y/o female admitted 05/23/18 due to post cardiac arrest in setting of presumed intentional overdose with benzodiazepine and muscle relaxants.  Bystander CPR initiated, has aspiration pneumonia and ARDS. ETT 10/12-28. Has cortrak.  Since extubation she has remained confused and periodically agitated.  She has copious secretions but has difficulty expectorating them effectively and has required frequent suctioning NT suctioning.       SLP Plan  Continue with current plan of care       Recommendations  Diet recommendations: NPO;Other(comment)(cortrak) Medication Administration: Via alternative means                Oral Care Recommendations: Oral care QID Follow up Recommendations: Other (comment)(tbd) SLP  Visit Diagnosis: Dysphagia, unspecified (R13.10) Plan: Continue with current plan of care       GO               Rondel Baton, MS, CCC-SLP Speech-Language Pathologist Acute Rehabilitation Services Pager: [phone] Office: [phone]  Courtney Beard 06/12/2018, 12:51 PM

## 2018-06-12 NOTE — Evaluation (Signed)
Physical Therapy Evaluation Patient Details Name: Courtney Beard MRN: [id number] DOB: [birth year] Today's Date: 06/12/2018   History of Present Illness  Pt is a 67 y/o female admitted secondary to Cardiac arrest complicated by aspiration PNA and ARDS secondary to overdose. Pt was intubated on 10/12 and was extubated 10/28. PMH includes Anxiety, Depression, ETOH abuse.   Clinical Impression  Pt admitted secondary to problem above with deficits below. Pt requiring mod A for bed mobility this session. Once sitting, pt reporting light headedness and presented with increased respiratory effort, so returned pt to supine. BPs at 140s/80s mmHg in sitting. Once returned to supine, symptoms resolved. Pt able to follow simple commands throughout session. Speech soft spoken and sometimes slurred, so had difficulty understanding pt at times. At this time, feel pt will require post acute rehab, however, will determine most appropriate venue as pt's mobility tolerance improves. Will continue to follow acutely to maximize functional mobility independence and safety.     Follow Up Recommendations Supervision for mobility/OOB;Other (comment)(CIR vs SNF )    Equipment Recommendations  None recommended by PT    Recommendations for Other Services OT consult     Precautions / Restrictions Precautions Precautions: Fall;Other (comment) Precaution Comments: suicide precautions, rectal tube, feeding tube Restrictions Weight Bearing Restrictions: No      Mobility  Bed Mobility Overal bed mobility: Needs Assistance Bed Mobility: Supine to Sit;Sit to Supine     Supine to sit: Mod assist Sit to supine: Mod assist   General bed mobility comments: Mod A for trunk assist and LE assist throughout bed mobility. Min to Mod A to maintain sitting balance. Once sitting up, pt reporting light headedness; checked BP and BP in 140s/80s. Pt keeping eyes closed and with increased respiratory effort, so further mobility  deferred.   Transfers                    Ambulation/Gait                Stairs            Wheelchair Mobility    Modified Rankin (Stroke Patients Only)       Balance Overall balance assessment: Needs assistance Sitting-balance support: No upper extremity supported;Feet supported Sitting balance-Leahy Scale: Poor Sitting balance - Comments: Reliant on min to mod A to maintain sitting balance.                                      Pertinent Vitals/Pain Pain Assessment: No/denies pain    Home Living Family/patient expects to be discharged to:: Private residence Living Arrangements: Spouse/significant other Available Help at Discharge: Family;Available 24 hours/day Type of Home: House Home Access: Stairs to enter Entrance Stairs-Rails: Doctor, general practice of Steps: stated there was a few steps  Home Layout: One level Home Equipment: None      Prior Function Level of Independence: Independent               Hand Dominance        Extremity/Trunk Assessment   Upper Extremity Assessment Upper Extremity Assessment: Defer to OT evaluation    Lower Extremity Assessment Lower Extremity Assessment: Generalized weakness;RLE deficits/detail;LLE deficits/detail RLE Deficits / Details: Able to perform heel slides, LAQ and ankle pumps.  LLE Deficits / Details: Able to perform heel slides, LAQ and ankle pumps.     Cervical / Trunk Assessment Cervical /  Trunk Assessment: Kyphotic  Communication   Communication: Expressive difficulties(soft spoken and slurred speech)  Cognition Arousal/Alertness: Awake/alert Behavior During Therapy: WFL for tasks assessed/performed Overall Cognitive Status: No family/caregiver present to determine baseline cognitive functioning                                 General Comments: Pt soft spoken and with some slurred speech, so difficulty understanding pt at times. Was able  to answer home information and unsure if correct. Pt followed simple commands throughout session.       General Comments General comments (skin integrity, edema, etc.): No family present during session.     Exercises General Exercises - Lower Extremity Ankle Circles/Pumps: AROM;Both;5 reps;Supine Long Arc Quad: AROM;Both;Other reps (comment);Seated(2 reps ) Heel Slides: AROM;Both;5 reps;Supine   Assessment/Plan    PT Assessment Patient needs continued PT services  PT Problem List Decreased strength;Decreased activity tolerance;Decreased balance;Decreased mobility;Decreased knowledge of precautions;Decreased knowledge of use of DME;Cardiopulmonary status limiting activity       PT Treatment Interventions DME instruction;Gait training;Functional mobility training;Therapeutic exercise;Therapeutic activities;Balance training;Patient/family education    PT Goals (Current goals can be found in the Care Plan section)  Acute Rehab PT Goals Patient Stated Goal: "to take it one day at a time"  PT Goal Formulation: With patient Time For Goal Achievement: 06/26/18 Potential to Achieve Goals: Good    Frequency Min 3X/week   Barriers to discharge        Co-evaluation               AM-PAC PT "6 Clicks" Daily Activity  Outcome Measure Difficulty turning over in bed (including adjusting bedclothes, sheets and blankets)?: Unable Difficulty moving from lying on back to sitting on the side of the bed? : Unable Difficulty sitting down on and standing up from a chair with arms (e.g., wheelchair, bedside commode, etc,.)?: Unable Help needed moving to and from a bed to chair (including a wheelchair)?: A Lot Help needed walking in hospital room?: Total Help needed climbing 3-5 steps with a railing? : Total 6 Click Score: 7    End of Session   Activity Tolerance: Treatment limited secondary to medical complications (Comment)(lightheadedness) Patient left: in bed;with call bell/phone  within reach;with nursing/sitter in room Nurse Communication: Mobility status PT Visit Diagnosis: Other abnormalities of gait and mobility (R26.89);Muscle weakness (generalized) (M62.81);Difficulty in walking, not elsewhere classified (R26.2)    Time: 1610-9604 PT Time Calculation (min) (ACUTE ONLY): 13 min   Charges:   PT Evaluation $PT Eval Moderate Complexity: 1 Mod          Gladys Damme, PT, DPT  Acute Rehabilitation Services  Pager: [phone] Office: [phone]   Courtney Beard 06/12/2018, 9:08 AM

## 2018-06-12 NOTE — Progress Notes (Signed)
Patient able to void after foley was D/C. Will continue to monitor.

## 2018-06-12 NOTE — Care Management Important Message (Signed)
Important Message  Patient Details  Name: Courtney Beard MRN: [id number] Date of Birth: 1951/02/17   Medicare Important Message Given:  Yes    Abran Gavigan P Tyanne Derocher 06/12/2018, 5:04 PM

## 2018-06-13 DIAGNOSIS — G9341 Metabolic encephalopathy: Secondary | ICD-10-CM

## 2018-06-13 LAB — BASIC METABOLIC PANEL
Anion gap: 4 — ABNORMAL LOW (ref 5–15)
BUN: 30 mg/dL — AB (ref 8–23)
CO2: 28 mmol/L (ref 22–32)
Calcium: 9.5 mg/dL (ref 8.9–10.3)
Chloride: 116 mmol/L — ABNORMAL HIGH (ref 98–111)
Creatinine, Ser: 0.65 mg/dL (ref 0.44–1.00)
GFR calc Af Amer: >60 mL/min (ref >60)
GLUCOSE: 112 mg/dL — AB (ref 70–99)
POTASSIUM: 3.5 mmol/L (ref 3.5–5.1)
Sodium: 148 mmol/L — ABNORMAL HIGH (ref 135–145)

## 2018-06-13 LAB — CBC
HEMATOCRIT: 26.1 % — AB (ref 36.0–46.0)
HEMOGLOBIN: 7.7 g/dL — AB (ref 12.0–15.0)
MCH: 23.8 pg — AB (ref 26.0–34.0)
MCHC: 29.5 g/dL — ABNORMAL LOW (ref 30.0–36.0)
MCV: 80.8 fL (ref 80.0–100.0)
Platelets: 861 10*3/uL — ABNORMAL HIGH (ref 150–400)
RBC: 3.23 MIL/uL — AB (ref 3.87–5.11)
RDW: 15.6 % — ABNORMAL HIGH (ref 11.5–15.5)
WBC: 16.2 10*3/uL — ABNORMAL HIGH (ref 4.0–10.5)
nRBC: 0 % (ref 0.0–0.2)

## 2018-06-13 MED ORDER — FREE WATER
200.0000 mL | Status: DC
  Administered 2018-06-13 – 2018-06-14 (×6): 200 mL

## 2018-06-13 NOTE — Progress Notes (Signed)
PROGRESS NOTE        PATIENT DETAILS Name: Courtney Beard Age: 67 y.o. Sex: female Date of Birth: 11/23/1950 Admit Date: 05/23/2018 Admitting Physician Reyes Ivan, MD ZOX:[id number], Sonny Masters, MD  Brief Narrative: Patient is a 67 y.o. female with history of EtOH use, anxiety/depression presented with a cardiac arrest in the setting of overdose and aspiration pneumonia resulting in ARDS and prolonged ventilator dependence.  Per H&P, patient's husband found the patient on day of admission unresponsive-covered in emesis with no palpable pulse- with empty bottles of Klonopin and Zanaflex.  Patient was managed in the ICU, was on broad-spectrum antimicrobial therapy-finally extubated on 10/28.  Further hospital course has been complicated by delirium, dysphagia requiring CorPak tube placement.  See below for further details.  Subjective: Completely awake and alert this morning.  Spouse at bedside-acknowledges significant improvement in patient's mental status.  Voice continues to be raspy and hoarse.  Assessment/Plan: Septic shock with acute hypoxic respiratory failure with ARDS secondary to aspiration pneumonia/HCAP: Overall improved, sepsis pathophysiology has markedly improved-required prolonged ventilator dependence-extubated on 10/28.  Has completed a course of antimicrobial therapy.  Although Candida seen in sputum cultures-this is thought to be a colonization/contamination rather than a true infection.  Supportive care-titrate off oxygen as tolerated.  Acute toxic/metabolic encephalopathy: Multifactorial-secondary to hypoxia/sedatives/narcotics-seems to be rapidly improving.  Continue Depakote/Seroquel-minimize narcotics and benzodiazepines as much as possible.    Sinus tachycardia: Secondary to acute illness-numerous physiologic stressors and anemia.  Tachycardia seems to be improving-only mildly tachycardic this morning.  Continue to monitor on telemetry.    Hypernatremia: Likely secondary to free water deficit-increase free water flushes to 200 cc every 4 hours-we will start decreasing half-normal saline.  Recheck electrolytes tomorrow.  Hypokalemia: Repleted.  Dysphagia: Related to prolonged ventilator dependence/ET tube placement-remains n.p.o-cortak tube in place-speech therapy continues to follow-hopefully with improvement in mentation-we shall be able to begin a diet soon.  Await further recommendations from speech therapy.  Anemia: Secondary to critical illness-no evidence of overt blood loss.  Plans are to transfuse if significant drop in hemoglobin.  Check anemia panel tomorrow morning.   Thrombocytosis: Suspect reactive-but will check for iron deficiency-we will check iron panel with tomorrow morning's lab..  Suspected intentional drug overdose: Continue to optimize medical condition-if she improves further-we will consult psychiatry-bedside sitter remains in place.   DVT Prophylaxis: Prophylactic Heparin  Code Status: Full code  Family Communication: Spouse at bedside  Disposition Plan: Remain inpatient  Antimicrobial agents: Anti-infectives (From admission, onward)   Start     Dose/Rate Route Frequency Ordered Stop   06/08/18 0100  vancomycin (VANCOCIN) IVPB 750 mg/150 ml premix     750 mg 150 mL/hr over 60 Minutes Intravenous Every 12 hours 06/07/18 1307 06/11/18 1341   06/06/18 0100  vancomycin (VANCOCIN) 500 mg in sodium chloride 0.9 % 100 mL IVPB  Status:  Discontinued     500 mg 100 mL/hr over 60 Minutes Intravenous Every 12 hours 06/05/18 1119 06/07/18 1307   06/05/18 1200  meropenem (MERREM) 1 g in sodium chloride 0.9 % 100 mL IVPB     1 g 200 mL/hr over 30 Minutes Intravenous Every 8 hours 06/05/18 1119 06/11/18 2229   06/05/18 1130  vancomycin (VANCOCIN) IVPB 1000 mg/200 mL premix     1,000 mg 200 mL/hr over 60 Minutes Intravenous  Once 06/05/18 1119 06/05/18  1330   05/31/18 1000  ceFEPIme (MAXIPIME) 2 g in  sodium chloride 0.9 % 100 mL IVPB  Status:  Discontinued     2 g 200 mL/hr over 30 Minutes Intravenous Every 12 hours 05/31/18 0903 06/05/18 1109   05/31/18 1000  vancomycin (VANCOCIN) 500 mg in sodium chloride 0.9 % 100 mL IVPB  Status:  Discontinued     500 mg 100 mL/hr over 60 Minutes Intravenous Every 12 hours 05/31/18 0903 06/02/18 1039   05/26/18 1300  cefTRIAXone (ROCEPHIN) 1 g in sodium chloride 0.9 % 100 mL IVPB  Status:  Discontinued     1 g 200 mL/hr over 30 Minutes Intravenous Every 24 hours 05/26/18 1031 05/31/18 0840   05/25/18 2200  vancomycin (VANCOCIN) IVPB 750 mg/150 ml premix  Status:  Discontinued     750 mg 150 mL/hr over 60 Minutes Intravenous Every 12 hours 05/25/18 1136 05/25/18 1348   05/24/18 2200  vancomycin (VANCOCIN) 500 mg in sodium chloride 0.9 % 100 mL IVPB  Status:  Discontinued     500 mg 100 mL/hr over 60 Minutes Intravenous Every 12 hours 05/24/18 0953 05/25/18 1136   05/24/18 1100  vancomycin (VANCOCIN) IVPB 1000 mg/200 mL premix     1,000 mg 200 mL/hr over 60 Minutes Intravenous  Once 05/24/18 0953 05/24/18 1109   05/23/18 0715  piperacillin-tazobactam (ZOSYN) IVPB 3.375 g  Status:  Discontinued     3.375 g 12.5 mL/hr over 240 Minutes Intravenous Every 8 hours 05/23/18 0706 05/26/18 0943      Procedures: ETT 10/12>>10/28 PICC 10/21  CONSULTS:  pulmonary/intensive care  Time spent: 25  minutes-Greater than 50% of this time was spent in counseling, explanation of diagnosis, planning of further management, and coordination of care.  MEDICATIONS: Scheduled Meds: . Chlorhexidine Gluconate Cloth  6 each Topical Daily  . famotidine  20 mg Oral Daily  . folic acid  1 mg Per Tube Daily  . free water  200 mL Per Tube Q4H  . heparin  5,000 Units Subcutaneous Q8H  . mouth rinse  15 mL Mouth Rinse BID  . potassium chloride  40 mEq Per Tube BID  . QUEtiapine  50 mg Per Tube BID  . sodium chloride flush  10-40 mL Intracatheter Q12H  . thiamine  100  mg Per Tube Daily   Continuous Infusions: . sodium chloride Stopped (06/13/18 0911)  . feeding supplement (JEVITY 1.2 CAL) 1,000 mL (06/13/18 1106)  . valproate sodium 55 mL/hr at 06/13/18 0922   PRN Meds:.acetaminophen, albuterol, bisacodyl, guaiFENesin, HYDROmorphone (DILAUDID) injection, labetalol, lip balm, LORazepam, polyethylene glycol, sennosides, sodium chloride flush   PHYSICAL EXAM: Vital signs: Vitals:   06/13/18 0656 06/13/18 0749 06/13/18 0908 06/13/18 0927  BP:  138/61 (!) 146/95 132/83  Pulse: (!) 101 98 100 (!) 103  Resp: (!) 26 20 (!) 26 (!) 21  Temp: 97.7 F (36.5 C)   98.1 F (36.7 C)  TempSrc: Oral   Oral  SpO2: 95% 95% 100% 99%  Weight:      Height:       Filed Weights   06/10/18 0500 06/11/18 0500 06/13/18 0500  Weight: 47.3 kg 46.9 kg 46.7 kg   Body mass index is 18.83 kg/m.   General appearance:Awake, alert, not in any distress.  Voice is hoarse and raspy. Eyes:no scleral icterus. HEENT: Atraumatic and Normocephalic Neck: supple, no JVD. Resp:Good air entry bilaterally,no rales or rhonchi CVS: S1 S2 regular, no murmurs.  GI: Bowel sounds present, Non tender  and not distended with no gaurding, rigidity or rebound. Extremities: B/L Lower Ext shows no edema, both legs are warm to touch Neurology:  Non focal but with generalized weakness. Psychiatric: Normal judgment and insight. Normal mood. Musculoskeletal:No digital cyanosis Skin:No Rash, warm and dry Wounds:N/A  I have personally reviewed following labs and imaging studies  LABORATORY DATA: CBC: Recent Labs  Lab 06/07/18 0235 06/07/18 1157 06/09/18 0857 06/10/18 0412 06/11/18 0519 06/12/18 0355 06/13/18 0459  WBC 16.8* 14.9* 19.3* 22.7* 22.0* 16.0* 16.2*  NEUTROABS 13.3* 11.3*  --   --   --   --   --   HGB 7.6* 7.3* 7.4* 7.3* 7.7* 7.6* 7.7*  HCT 23.5* 22.8* 24.1* 24.0* 25.5* 25.6* 26.1*  MCV 79.9* 80.6 78.8* 81.1 80.4 80.8 80.8  PLT 950* 871* 917* 767* 895* 893* 861*    Basic  Metabolic Panel: Recent Labs  Lab 06/09/18 0857 06/10/18 0412 06/11/18 0519 06/12/18 0355 06/13/18 0459  NA 145 149* 148* 152* 148*  K 3.3* 4.0 3.5 3.2* 3.5  CL 107 111 111 116* 116*  CO2 29 31 27 30 28   GLUCOSE 141* 125* 153* 125* 112*  BUN 24* 26* 26* 33* 30*  CREATININE 0.66 0.59 0.66 0.73 0.65  CALCIUM 9.4 9.3 9.4 9.5 9.5    GFR: Estimated Creatinine Clearance: 51.7 mL/min (by C-G formula based on SCr of 0.65 mg/dL).  Liver Function Tests: No results for input(s): AST, ALT, ALKPHOS, BILITOT, PROT, ALBUMIN in the last 168 hours. No results for input(s): LIPASE, AMYLASE in the last 168 hours. No results for input(s): AMMONIA in the last 168 hours.  Coagulation Profile: No results for input(s): INR, PROTIME in the last 168 hours.  Cardiac Enzymes: No results for input(s): CKTOTAL, CKMB, CKMBINDEX, TROPONINI in the last 168 hours.  BNP (last 3 results) No results for input(s): PROBNP in the last 8760 hours.  HbA1C: No results for input(s): HGBA1C in the last 72 hours.  CBG: Recent Labs  Lab 06/08/18 2029 06/09/18 0014 06/09/18 0815 06/09/18 1225 06/09/18 1635  GLUCAP 119* 127* 135* 131* 119*    Lipid Profile: No results for input(s): CHOL, HDL, LDLCALC, TRIG, CHOLHDL, LDLDIRECT in the last 72 hours.  Thyroid Function Tests: No results for input(s): TSH, T4TOTAL, FREET4, T3FREE, THYROIDAB in the last 72 hours.  Anemia Panel: No results for input(s): VITAMINB12, FOLATE, FERRITIN, TIBC, IRON, RETICCTPCT in the last 72 hours.  Urine analysis:    Component Value Date/Time   COLORURINE YELLOW 05/23/2018 0719   APPEARANCEUR CLEAR 05/23/2018 0719   LABSPEC 1.016 05/23/2018 0719   PHURINE 5.0 05/23/2018 0719   GLUCOSEU NEGATIVE 05/23/2018 0719   HGBUR SMALL (A) 05/23/2018 0719   BILIRUBINUR NEGATIVE 05/23/2018 0719   KETONESUR NEGATIVE 05/23/2018 0719   PROTEINUR NEGATIVE 05/23/2018 0719   NITRITE NEGATIVE 05/23/2018 0719   LEUKOCYTESUR NEGATIVE  05/23/2018 0719    Sepsis Labs: Lactic Acid, Venous    Component Value Date/Time   LATICACIDVEN 1.4 05/23/2018 1052    MICROBIOLOGY: Recent Results (from the past 240 hour(s))  Culture, blood (Routine X 2) w Reflex to ID Panel     Status: None   Collection Time: 06/04/18 11:15 AM  Result Value Ref Range Status   Specimen Description BLOOD LEFT ARM  Final   Special Requests   Final    BOTTLES DRAWN AEROBIC ONLY Blood Culture adequate volume   Culture   Final    NO GROWTH 5 DAYS Performed at [facility], [address], [place], [place] [address]    Report Status 06/09/2018 FINAL  Final  Culture, blood (Routine X 2) w Reflex to ID Panel     Status: None   Collection Time: 06/04/18 11:23 AM  Result Value Ref Range Status   Specimen Description BLOOD LEFT ANTECUBITAL  Final   Special Requests   Final    BOTTLES DRAWN AEROBIC ONLY Blood Culture results may not be optimal due to an inadequate volume of blood received in culture bottles   Culture   Final    NO GROWTH 5 DAYS Performed at [facility], [address] [place], [place] [address]    Report Status 06/09/2018 FINAL  Final  Culture, respiratory (non-expectorated)     Status: None   Collection Time: 06/04/18 11:54 AM  Result Value Ref Range Status   Specimen Description TRACHEAL ASPIRATE  Final   Special Requests Normal  Final   Gram Stain   Final    MODERATE WBC PRESENT, PREDOMINANTLY PMN NO ORGANISMS SEEN Performed at [facility], [address] [place], [place] [address]    Culture FEW CANDIDA ALBICANS  Final   Report Status 06/06/2018 FINAL  Final    RADIOLOGY STUDIES/RESULTS: Dg Chest 1 View  Result Date: 05/23/2018 CLINICAL DATA:  Aspiration pneumonia. Acute hypoxemic respiratory failure. Central line placement. EXAM: CHEST  1 VIEW COMPARISON:  Prior today FINDINGS: A new right jugular central venous catheter is seen with tip overlying the distal SVC. Endotracheal tube and nasogastric  tube remain in appropriate position. No pneumothorax visualized. Worsening airspace disease is seen throughout the right lung. Mild airspace disease in the medial left lower lung also shows mild worsening. IMPRESSION: New right jugular central venous catheter in appropriate position. No pneumothorax visualized. Interval worsening of airspace disease throughout the right lung and in the medial left lower lobe. Electronically Signed   By: Myles Rosenthal M.D.   On: 05/23/2018 23:07   Ct Head Wo Contrast  Result Date: 05/23/2018 CLINICAL DATA:  Pulseless arrest, status post CPR. EXAM: CT HEAD WITHOUT CONTRAST CT CERVICAL SPINE WITHOUT CONTRAST TECHNIQUE: Multidetector CT imaging of the head and cervical spine was performed following the standard protocol without intravenous contrast. Multiplanar CT image reconstructions of the cervical spine were also generated. COMPARISON:  None. FINDINGS: CT HEAD FINDINGS Brain: There is no mass, hemorrhage or extra-axial collection. The size and configuration of the ventricles and extra-axial CSF spaces are normal. There is no acute or chronic infarction. The brain parenchyma is normal. Vascular: No abnormal hyperdensity of the major intracranial arteries or dural venous sinuses. No intracranial atherosclerosis. Skull: The visualized skull base, calvarium and extracranial soft tissues are normal. Sinuses/Orbits: Fluid layering in the maxillary and sphenoid sinuses. The orbits are normal. CT CERVICAL SPINE FINDINGS Alignment: There is grade 1 anterolisthesis at C3-4, C4-5 and C5-6 secondary to facet hypertrophy. There is normal facet alignment. Lateral masses of C1 and C2 and the occipital condyles are aligned. Skull base and vertebrae: No acute fracture. Soft tissues and spinal canal: No prevertebral fluid or swelling. No visible canal hematoma. Disc levels: No advanced spinal canal or neural foraminal stenosis. Upper chest: Large area of consolidation in the right upper lobe.  Other: Patient is intubated with the endotracheal tube tip beyond the field of view. IMPRESSION: 1. No acute intracranial abnormality. 2. No acute fracture of the cervical spine. 3. Large area of right upper lobe consolidation, possibly secondary to aspiration. Electronically Signed   By: Deatra Robinson  M.D.   On: 05/23/2018 06:34   Ct Cervical Spine Wo Contrast  Result Date: 05/23/2018 CLINICAL DATA:  Pulseless arrest, status post CPR. EXAM: CT HEAD WITHOUT CONTRAST CT CERVICAL SPINE WITHOUT CONTRAST TECHNIQUE: Multidetector CT imaging of the head and cervical spine was performed following the standard protocol without intravenous contrast. Multiplanar CT image reconstructions of the cervical spine were also generated. COMPARISON:  None. FINDINGS: CT HEAD FINDINGS Brain: There is no mass, hemorrhage or extra-axial collection. The size and configuration of the ventricles and extra-axial CSF spaces are normal. There is no acute or chronic infarction. The brain parenchyma is normal. Vascular: No abnormal hyperdensity of the major intracranial arteries or dural venous sinuses. No intracranial atherosclerosis. Skull: The visualized skull base, calvarium and extracranial soft tissues are normal. Sinuses/Orbits: Fluid layering in the maxillary and sphenoid sinuses. The orbits are normal. CT CERVICAL SPINE FINDINGS Alignment: There is grade 1 anterolisthesis at C3-4, C4-5 and C5-6 secondary to facet hypertrophy. There is normal facet alignment. Lateral masses of C1 and C2 and the occipital condyles are aligned. Skull base and vertebrae: No acute fracture. Soft tissues and spinal canal: No prevertebral fluid or swelling. No visible canal hematoma. Disc levels: No advanced spinal canal or neural foraminal stenosis. Upper chest: Large area of consolidation in the right upper lobe. Other: Patient is intubated with the endotracheal tube tip beyond the field of view. IMPRESSION: 1. No acute intracranial abnormality. 2. No  acute fracture of the cervical spine. 3. Large area of right upper lobe consolidation, possibly secondary to aspiration. Electronically Signed   By: Deatra Robinson M.D.   On: 05/23/2018 06:34   Dg Chest Port 1 View  Result Date: 06/10/2018 CLINICAL DATA:  Fall pneumonia EXAM: PORTABLE CHEST 1 VIEW COMPARISON:  06/08/2018 FINDINGS: Right-sided PICC line is again noted and stable. The endotracheal tube is been removed in the interval. The nasogastric catheter has been exchanged for a feeding tube. Patchy infiltrates are again noted bilaterally right greater than left. No bony abnormality is noted. IMPRESSION: Stable patchy infiltrates. Electronically Signed   By: Alcide Clever M.D.   On: 06/10/2018 10:55   Dg Chest Port 1 View  Result Date: 06/08/2018 CLINICAL DATA:  Acute respiratory failure with hypoxemia EXAM: PORTABLE CHEST 1 VIEW COMPARISON:  06/07/2018 FINDINGS: Cardiac shadow is stable. Endotracheal tube, nasogastric catheter and right-sided PICC line are again seen and stable. Patchy infiltrates are again identified throughout both lungs right greater than left. No sizable effusion is seen. No bony abnormality is noted. IMPRESSION: Stable patchy infiltrates bilaterally right greater than left. Tubes and lines as described. Electronically Signed   By: Alcide Clever M.D.   On: 06/08/2018 07:24   Dg Chest Port 1 View  Result Date: 06/07/2018 CLINICAL DATA:  Acute respiratory failure with hypoxemia EXAM: PORTABLE CHEST 1 VIEW COMPARISON:  06/06/2018 FINDINGS: Multifocal patchy opacities, right lung predominant, grossly unchanged. No pleural effusion or pneumothorax. Endotracheal tube terminates 8 cm above the carina. Right arm PICC terminates at the cavoatrial junction. Enteric tube courses into the stomach. IMPRESSION: Multifocal pneumonia, right lobe predominant, unchanged. Endotracheal tube terminates 8 cm above the carina. Additional support apparatus as above. Electronically Signed   By: Charline Bills M.D.   On: 06/07/2018 08:36   Dg Chest Port 1 View  Result Date: 06/06/2018 CLINICAL DATA:  Acute respiratory failure.  Hypoxemia. EXAM: PORTABLE CHEST 1 VIEW COMPARISON:  June 05, 2018 FINDINGS: The ETT and right PICC line are stable. The NG tube terminates  below today's film. Right greater than left patchy pulmonary infiltrates are similar in the interval. No other interval changes. IMPRESSION: Multifocal pneumonia remains, similar in the interval. Stable support apparatus. Electronically Signed   By: Gerome Sam III M.D   On: 06/06/2018 08:42   Dg Chest Port 1 View  Result Date: 06/05/2018 CLINICAL DATA:  Respiratory fail, aspiration pneumonia EXAM: PORTABLE CHEST 1 VIEW COMPARISON:  Portable exam 0429 hours compared to 06/04/2018 FINDINGS: Tip of endotracheal tube projects 4.3 cm above carina. Nasogastric tube extends into stomach. RIGHT arm PICC line tip projects over SVC. Normal heart size mediastinal contours. Patchy BILATERAL airspace infiltrates consistent with multifocal pneumonia, RIGHT greater than LEFT. Infiltrates appear mildly improved versus previous exam. No pleural effusion or pneumothorax. IMPRESSION: Multifocal pneumonia, with mildly improved aeration versus previous exam. Electronically Signed   By: Ulyses Southward M.D.   On: 06/05/2018 10:10   Dg Chest Port 1 View  Result Date: 06/04/2018 CLINICAL DATA:  67 year old with respiratory failure. EXAM: PORTABLE CHEST 1 VIEW COMPARISON:  06/03/2018 FINDINGS: Endotracheal tube is 3.3 cm above the carina. Nasogastric tube extends into the abdomen. PICC line tip in the lower SVC region. Patchy parenchymal disease and airspace disease in the right lung has slightly improved, particularly at the right lung base. Probable right pleural effusion. However, there are new or worsening patchy airspace densities along the periphery of the left lung. Heart size is within normal limits and stable. Negative for a pneumothorax. IMPRESSION:  1. New or worsening airspace disease in left lung. Findings are concerning for left lung pneumonia. 2. Persistent patchy airspace disease in the right lung is most compatible with pneumonia with slightly improved aeration at the right lung base. 3. Probable small pleural effusions. 4. Visualized support apparatuses are appropriately positioned. Electronically Signed   By: Richarda Overlie M.D.   On: 06/04/2018 10:13   Dg Chest Port 1 View  Result Date: 06/03/2018 CLINICAL DATA:  Acute respiratory failure with hypoxemia EXAM: PORTABLE CHEST 1 VIEW COMPARISON:  06/02/2018 FINDINGS: The patient is rotated to the right on today's radiograph, reducing diagnostic sensitivity and specificity. Endotracheal tube tip 2.8 cm above the carina, satisfactorily position. A nasogastric tube extends below the inferior margin of the radiograph. Right PICC line tip: Lower SVC. Continued extensive airspace opacity in the right lung, worsened at the right lung base compared to prior. There is some mild bandlike density in the left mid lung, increased in conspicuity. Heart size within normal limits. IMPRESSION: 1. Worsened airspace opacity at the right lung base with continued notable right upper lobe airspace opacity. 2. Mild increase in bandlike density in the left mid lung, probably atelectasis. 3. Support apparatus satisfactorily position. Electronically Signed   By: Gaylyn Rong M.D.   On: 06/03/2018 10:28   Dg Chest Port 1 View  Result Date: 06/02/2018 CLINICAL DATA:  Aspiration into the airway. EXAM: PORTABLE CHEST 1 VIEW COMPARISON:  Most recent comparison yesterday at 0507 hour FINDINGS: Patient is significantly rotated. Endotracheal tube approximately 15 mm from the carina. Enteric tube in place with tip and side-port below the diaphragm. Right internal jugular central venous catheter in the mid SVC. Right upper extremity PICC with tip in the distal SVC. Patchy opacities throughout the right lung, most confluent in  the lower lobe without significant interval change allowing for differences in technique. Slight improvement in left lung base opacity. Heart size and mediastinal contours are grossly unchanged, partially obscured by rotation. No pneumothorax. IMPRESSION: 1. No significant change  in patchy opacities throughout the right lung allowing for differences in technique and rotation. 2. New right upper extremity PICC with tip in the distal SVC. Borderline low positioning of endotracheal tube tip 15 mm from the carina. 3. Slight improving left basilar aeration. Electronically Signed   By: Narda Rutherford M.D.   On: 06/02/2018 02:36   Dg Chest Port 1 View  Result Date: 06/01/2018 CLINICAL DATA:  67 year old female with pneumonia. Subsequent encounter. EXAM: PORTABLE CHEST 1 VIEW COMPARISON:  05/30/2018 chest x-ray. FINDINGS: Endotracheal tube tip 3.9 cm above the carina. Right central line tip mid superior vena cava level. No pneumothorax detected. Nasogastric tube courses below the diaphragm. Tip is not included on the present exam. Minimal improvement of consolidation right lower lobe and patchy consolidation right upper lobe which may represent infectious infiltrate in proper clinical setting. Asymmetric pulmonary vascular congestion/mild pulmonary edema. Heart size within normal limits. IMPRESSION: 1. Minimal improvement of right lower lobe consolidation and patchy consolidation right upper lobe suggestive of result of pneumonia. 2. Asymmetric mild pulmonary vascular congestion/pulmonary edema without change. Electronically Signed   By: Lacy Duverney M.D.   On: 06/01/2018 07:22   Dg Chest Port 1 View  Result Date: 05/30/2018 CLINICAL DATA:  Aspiration pneumonia. EXAM: PORTABLE CHEST 1 VIEW COMPARISON:  One-view chest x-ray 05/29/2018 FINDINGS: Heart size is normal. Endotracheal tube terminates 3 cm above the carina. NG tube is in the stomach. Right IJ line is stable. Right lower lobe airspace disease is again  seen. Mild edema is slightly increased. Minimal left basilar atelectasis is stable. IMPRESSION: 1. Persistent right lower lobe airspace disease compatible with pneumonia or aspiration. 2. Slight progression in edema and left basilar atelectasis. Electronically Signed   By: Marin Roberts M.D.   On: 05/30/2018 07:42   Dg Chest Port 1 View  Result Date: 05/29/2018 CLINICAL DATA:  Respiratory failure EXAM: PORTABLE CHEST 1 VIEW COMPARISON:  05/28/2018 FINDINGS: Cardiac shadow is stable. Right jugular central line, endotracheal tube and nasogastric catheter are again seen and stable. The left lung is predominantly clear although some left retrocardiac infiltrate is seen. Diffuse infiltrates are noted throughout the right lung stable in appearance when compared with the prior exam. Small right pleural effusion is noted as well. IMPRESSION: Stable bilateral infiltrates right greater than left with associated right effusion. Tubes and lines as described. Electronically Signed   By: Alcide Clever M.D.   On: 05/29/2018 07:07   Dg Chest Port 1 View  Result Date: 05/28/2018 CLINICAL DATA:  Pneumonia. EXAM: PORTABLE CHEST 1 VIEW COMPARISON:  05/28/2018. FINDINGS: Endotracheal tube, NG tube, right IJ line in stable position. Heart size stable. Diffuse bilateral pulmonary infiltrates, particular on the right side, and right-sided pleural effusion again noted. Similar findings noted on prior exam. IMPRESSION: 1.  Lines and tubes stable position. 2. Diffuse bilateral pulmonary infiltrates, right side greater than left. Right-sided pleural effusion. Similar findings on prior exam. Electronically Signed   By: Maisie Fus  Register   On: 05/28/2018 11:47   Dg Chest Portable 1 View  Result Date: 05/28/2018 CLINICAL DATA:  ARDS, respiratory failure and aspiration pneumonia. EXAM: PORTABLE CHEST 1 VIEW COMPARISON:  05/27/2018 FINDINGS: Endotracheal tube remains with the tip projecting approximately 4 cm above the carina.  Right jugular central line shows stable positioning with the tip in the lower SVC. Gastric decompression tube extends into the stomach. Relatively stable airspace consolidation bilaterally, right greater than left with potentially mildly improved aeration at the right base. No significant pleural  effusions. No pneumothorax. Stable heart size. IMPRESSION: Relatively stable bilateral airspace consolidation with potentially slightly improved aeration at the right base. Electronically Signed   By: Irish Lack M.D.   On: 05/28/2018 07:53   Dg Chest Port 1 View  Result Date: 05/27/2018 CLINICAL DATA:  Shortness of breath, ARDS EXAM: PORTABLE CHEST 1 VIEW COMPARISON:  Portable chest x-ray of 05/26/2018 FINDINGS: There is slightly better aeration of the right upper lung. There appear to be bilateral pleural effusions. Opacity at the right lung base remains most likely due to pneumonia. The tip of the endotracheal tube is approximatelymm 2.0 cm above the carina. NG tube extends into the stomach. IMPRESSION: 1. Tip of endotracheal tube 2.0 cm above the carina. 2. Slightly.  Aeration of the right upper lung. 3. Bilateral pleural effusions and basilar atelectasis. Cannot exclude pneumonia particularly the right lung base. Electronically Signed   By: Dwyane Dee M.D.   On: 05/27/2018 10:08   Dg Chest Port 1 View  Result Date: 05/26/2018 CLINICAL DATA:  Acute respiratory failure EXAM: PORTABLE CHEST 1 VIEW COMPARISON:  Portable chest x-ray of May 25, 2018 FINDINGS: There is developed further opacification of much of the right lung. The greatest density is inferiorly. On the left there is increasing interstitial and alveolar opacity in the mid and lower lung. The heart borders are partially obscured. The heart is top-normal in size. The pulmonary vascularity is not clearly engorged. The endotracheal tube tip is at the carina. The esophagogastric tube tip and proximal port project below the GE junction. The right  internal jugular venous catheter tip projects over the midportion of the SVC. IMPRESSION: Progressive opacification of both lungs worrisome for widespread pneumonia. Low positioning of the endotracheal tube. Withdrawal by 2 cm would help assure that the tube does not migrate into the right mainstem bronchus with patient movement. Electronically Signed   By: David  [place] M.D.   On: 05/26/2018 08:40   Dg Chest Port 1 View  Result Date: 05/25/2018 CLINICAL DATA:  Acute respiratory failure EXAM: PORTABLE CHEST 1 VIEW COMPARISON:  05/23/2018 FINDINGS: Endotracheal tube, nasogastric catheter and right jugular central line are again seen. Cardiac shadow is mildly enlarged but stable. Diffuse right-sided infiltrate is noted with some increasing consolidation in the lateral right lung base. Left retrocardiac infiltrate is noted as well. No pneumothorax is identified. IMPRESSION: Bilateral infiltrates right greater than left, increasing in the right lung base. Electronically Signed   By: Alcide Clever M.D.   On: 05/25/2018 07:24   Dg Chest Port 1 View  Result Date: 05/23/2018 CLINICAL DATA:  Suspected DVT.  Endotracheal tube. EXAM: PORTABLE CHEST 1 VIEW COMPARISON:  One-view chest x-ray 05/23/2018 at 4:13 a.m. FINDINGS: Heart size is normal. Pacing wires are in place. NG tube courses off the inferior border of the film. The endotracheal tube has been pulled back, now terminating 4.5 cm above the carina. Right middle lobe and lower lobe airspace disease is again seen. Asymmetric edema is present on the right. IMPRESSION: 1. Interval adjustment of endotracheal tube, now in satisfactory position. 2. Right middle and lower lobe airspace disease. This is concerning for infection versus aspiration. Underlying mass is not excluded. Electronically Signed   By: Marin Roberts M.D.   On: 05/23/2018 07:28   Dg Chest Portable 1 View  Result Date: 05/23/2018 CLINICAL DATA:  Intubation EXAM: PORTABLE CHEST 1 VIEW  COMPARISON:  None. FINDINGS: Endotracheal tube tip is within the proximal right mainstem bronchus. This should be retracted by  4.5 cm to place it at the level of the clavicular heads. There is a large area of right parahilar consolidation. The left lung is clear. No pleural effusion or pneumothorax. The nasogastric tube side port is below the diaphragm but not clearly visualized. A dedicated abdominal radiograph may be helpful. IMPRESSION: 1. Endotracheal tube tip in the proximal right mainstem bronchus. Retraction by 4 cm recommended. 2. Nasogastric tube side port below the diaphragm but not clearly visualized. Dedicated abdominal radiograph may be helpful. 3. Large area of right parahilar and medial right lung apex consolidation which may indicate infection or neoplastic process. Chest CT or Followup PA and lateral chest X-ray is recommended in 3-4 weeks following trial of antibiotic therapy to ensure resolution and exclude underlying malignancy. These results were called by telephone at the time of interpretation on 05/23/2018 at 5:18 am to Dr. Jaci Carrel , who verbally acknowledged these results. Electronically Signed   By: Deatra Robinson M.D.   On: 05/23/2018 05:18   Dg Abd Portable 1v  Result Date: 06/08/2018 CLINICAL DATA:  Nasogastric tube placement. EXAM: PORTABLE ABDOMEN - 1 VIEW COMPARISON:  Abdominal radiograph May 23, 2018 FINDINGS: Feeding tube tip projects in second portion of duodenum. Included bowel gas pattern is nondilated and nonobstructive. No intra-abdominal mass effect. Reticulonodular densities and alveolar airspace opacities included lung bases. Soft tissue planes and included osseous structures are non suspicious. IMPRESSION: Feeding tube tip projects in duodenum. Electronically Signed   By: Awilda Metro M.D.   On: 06/08/2018 16:09   Dg Abd Portable 1v  Result Date: 05/23/2018 CLINICAL DATA:  OG tube placement. EXAM: PORTABLE ABDOMEN - 1 VIEW COMPARISON:  One-view  chest x-ray of the same day. FINDINGS: OG tube is now in place. The side port is in the mid stomach. Pacing leads are stable. Right middle and lower lobe pneumonia are stable. IMPRESSION: 1. Satisfactory positioning of OG tube in the stomach. 2. Right lower lobe pneumonia. Electronically Signed   By: Marin Roberts M.D.   On: 05/23/2018 10:44   [place] Ekg Site Rite  Result Date: 06/01/2018 If Site Rite image not attached, placement could not be confirmed due to current cardiac rhythm.    LOS: 21 days   Jeoffrey Massed, MD  [facility]  If 7PM-7AM, please contact night-coverage  Please page via [email or web address] on MD name and type text message  06/13/2018, 1:44 PM

## 2018-06-14 ENCOUNTER — Encounter (HOSPITAL_COMMUNITY): Payer: Self-pay

## 2018-06-14 LAB — CBC
HCT: 25 % — ABNORMAL LOW (ref 36.0–46.0)
HEMOGLOBIN: 7.3 g/dL — AB (ref 12.0–15.0)
MCH: 23.5 pg — AB (ref 26.0–34.0)
MCHC: 29.2 g/dL — ABNORMAL LOW (ref 30.0–36.0)
MCV: 80.6 fL (ref 80.0–100.0)
NRBC: 0 % (ref 0.0–0.2)
PLATELETS: 724 10*3/uL — AB (ref 150–400)
RBC: 3.1 MIL/uL — AB (ref 3.87–5.11)
RDW: 15.7 % — ABNORMAL HIGH (ref 11.5–15.5)
WBC: 13.3 10*3/uL — AB (ref 4.0–10.5)

## 2018-06-14 LAB — IRON AND TIBC
IRON: 52 ug/dL (ref 28–170)
SATURATION RATIOS: 27 % (ref 10.4–31.8)
TIBC: 192 ug/dL — AB (ref 250–450)
UIBC: 140 ug/dL

## 2018-06-14 LAB — RETICULOCYTES
IMMATURE RETIC FRACT: 12.2 % (ref 2.3–15.9)
RBC.: 3.1 MIL/uL — ABNORMAL LOW (ref 3.87–5.11)
Retic Count, Absolute: 44.6 10*3/uL (ref 19.0–186.0)
Retic Ct Pct: 1.4 % (ref 0.4–3.1)

## 2018-06-14 LAB — FOLATE: FOLATE: 28 ng/mL (ref >5.9)

## 2018-06-14 LAB — VITAMIN B12: VITAMIN B 12: 1509 pg/mL — AB (ref 180–914)

## 2018-06-14 LAB — BASIC METABOLIC PANEL
ANION GAP: 4 — AB (ref 5–15)
BUN: 24 mg/dL — ABNORMAL HIGH (ref 8–23)
CHLORIDE: 111 mmol/L (ref 98–111)
CO2: 27 mmol/L (ref 22–32)
Calcium: 9 mg/dL (ref 8.9–10.3)
Creatinine, Ser: 0.59 mg/dL (ref 0.44–1.00)
GFR calc Af Amer: >60 mL/min (ref >60)
Glucose, Bld: 111 mg/dL — ABNORMAL HIGH (ref 70–99)
Potassium: 3.6 mmol/L (ref 3.5–5.1)
SODIUM: 142 mmol/L (ref 135–145)

## 2018-06-14 LAB — FERRITIN: Ferritin: 202 ng/mL (ref 11–307)

## 2018-06-14 MED ORDER — FREE WATER
100.0000 mL | Status: DC
  Administered 2018-06-14 – 2018-06-19 (×28): 100 mL

## 2018-06-14 NOTE — Progress Notes (Signed)
PROGRESS NOTE        PATIENT DETAILS Name: Courtney Beard Age: 67 y.o. Sex: female Date of Birth: 01/19/1951 Admit Date: 05/23/2018 Admitting Physician Reyes Ivan, MD ZOX:[id number], Sonny Masters, MD  Brief Narrative: Patient is a 67 y.o. female with history of EtOH use, anxiety/depression presented with a cardiac arrest in the setting of overdose and aspiration pneumonia resulting in ARDS and prolonged ventilator dependence.  Per H&P, patient's husband found the patient on day of admission unresponsive-covered in emesis with no palpable pulse- with empty bottles of Klonopin and Zanaflex.  Patient was managed in the ICU, was on broad-spectrum antimicrobial therapy-finally extubated on 10/28.  Further hospital course has been complicated by delirium, dysphagia requiring CorPak tube placement.  Mental status slowly improving with supportive care-remains n.p.o with NG feedings in place--see below for further details.  Subjective: Mental status continues to improve-she is much more awake and alert than compared to the past few days.  Wants to drink water.  Assessment/Plan: Septic shock with acute hypoxic respiratory failure with ARDS secondary to aspiration pneumonia/HCAP: Sepsis pathophysiology has improved-required prolonged ventilator dependence-extubated on 10/28.  Has already completed a course of antimicrobial therapy.  Although Candida seen in sputum culture, this is thought to be a colonization/contamination rather than a true infection.  Continue supportive care-incentive spirometry/flutter valve.  Continue to titrate off oxygen as tolerated.   Acute toxic/metabolic encephalopathy: Multifactorial-secondary to hypoxia/sedatives/narcotics-rapidly improving. Continue Depakote/Seroquel-minimize narcotics and benzodiazepines as much as possible.    Sinus tachycardia: Secondary to acute illness-numerous physiologic stressors and anemia.  Tachycardia seems to be  improving-only mildly tachycardic this morning.  Continue to monitor on telemetry.   Hypernatremia: Secondary to free water deficit-resolved with increase in free water flushes.  Decrease free water flushes 200 cc every 4 hours.  Follow electrolytes-can now keep half-normal to [facility].  Hypokalemia: Repleted.  Dysphagia: Related to prolonged ventilator dependent/ET tube placement-but hopefully with improvement in mental status-she will be able to tolerate oral intake.  I have asked nursing staff see if speech therapy can follow-up today.  Continue to status and NG tube feeding in the meantime.  Anemia: Secondary to critical illness-no evidence of overt blood loss.  Anemia panel not consistent with iron deficiency.  Follow CBC closely-and transfuse if less than 7.  Currently asymptomatic.    Thrombocytosis: Likely reactive-no indication for iron deficiency on lab data-follow.    Suspected intentional drug overdose: Status is rapidly improving-when asked if she remembers taking Zanaflex and Klonopin-she claims not to remember.  If mental status improvement continues-suspect she should be stable for a inpatient psych consult tomorrow.  Sitter remains in place.   Deconditioning/debility: Secondary to critical illness-appreciate PT eval-have consulted CIR to see if the patient is a candidate for CIR.  DVT Prophylaxis: Prophylactic Heparin  Code Status: Full code  Family Communication: Spouse at bedside  Disposition Plan: Remain inpatient  Antimicrobial agents: Anti-infectives (From admission, onward)   Start     Dose/Rate Route Frequency Ordered Stop   06/08/18 0100  vancomycin (VANCOCIN) IVPB 750 mg/150 ml premix     750 mg 150 mL/hr over 60 Minutes Intravenous Every 12 hours 06/07/18 1307 06/11/18 1341   06/06/18 0100  vancomycin (VANCOCIN) 500 mg in sodium chloride 0.9 % 100 mL IVPB  Status:  Discontinued     500 mg 100 mL/hr over 60 Minutes Intravenous  Every 12 hours 06/05/18 1119  06/07/18 1307   06/05/18 1200  meropenem (MERREM) 1 g in sodium chloride 0.9 % 100 mL IVPB     1 g 200 mL/hr over 30 Minutes Intravenous Every 8 hours 06/05/18 1119 06/11/18 2229   06/05/18 1130  vancomycin (VANCOCIN) IVPB 1000 mg/200 mL premix     1,000 mg 200 mL/hr over 60 Minutes Intravenous  Once 06/05/18 1119 06/05/18 1330   05/31/18 1000  ceFEPIme (MAXIPIME) 2 g in sodium chloride 0.9 % 100 mL IVPB  Status:  Discontinued     2 g 200 mL/hr over 30 Minutes Intravenous Every 12 hours 05/31/18 0903 06/05/18 1109   05/31/18 1000  vancomycin (VANCOCIN) 500 mg in sodium chloride 0.9 % 100 mL IVPB  Status:  Discontinued     500 mg 100 mL/hr over 60 Minutes Intravenous Every 12 hours 05/31/18 0903 06/02/18 1039   05/26/18 1300  cefTRIAXone (ROCEPHIN) 1 g in sodium chloride 0.9 % 100 mL IVPB  Status:  Discontinued     1 g 200 mL/hr over 30 Minutes Intravenous Every 24 hours 05/26/18 1031 05/31/18 0840   05/25/18 2200  vancomycin (VANCOCIN) IVPB 750 mg/150 ml premix  Status:  Discontinued     750 mg 150 mL/hr over 60 Minutes Intravenous Every 12 hours 05/25/18 1136 05/25/18 1348   05/24/18 2200  vancomycin (VANCOCIN) 500 mg in sodium chloride 0.9 % 100 mL IVPB  Status:  Discontinued     500 mg 100 mL/hr over 60 Minutes Intravenous Every 12 hours 05/24/18 0953 05/25/18 1136   05/24/18 1100  vancomycin (VANCOCIN) IVPB 1000 mg/200 mL premix     1,000 mg 200 mL/hr over 60 Minutes Intravenous  Once 05/24/18 0953 05/24/18 1109   05/23/18 0715  piperacillin-tazobactam (ZOSYN) IVPB 3.375 g  Status:  Discontinued     3.375 g 12.5 mL/hr over 240 Minutes Intravenous Every 8 hours 05/23/18 0706 05/26/18 0943      Procedures: ETT 10/12>>10/28 PICC 10/21  CONSULTS:  pulmonary/intensive care  Time spent: 25  minutes-Greater than 50% of this time was spent in counseling, explanation of diagnosis, planning of further management, and coordination of care.  MEDICATIONS: Scheduled Meds: .  Chlorhexidine Gluconate Cloth  6 each Topical Daily  . famotidine  20 mg Oral Daily  . folic acid  1 mg Per Tube Daily  . free water  100 mL Per Tube Q4H  . heparin  5,000 Units Subcutaneous Q8H  . mouth rinse  15 mL Mouth Rinse BID  . potassium chloride  40 mEq Per Tube BID  . QUEtiapine  50 mg Per Tube BID  . sodium chloride flush  10-40 mL Intracatheter Q12H  . thiamine  100 mg Per Tube Daily   Continuous Infusions: . sodium chloride 10 mL/hr at 06/14/18 0804  . feeding supplement (JEVITY 1.2 CAL) 1,000 mL (06/13/18 1106)  . valproate sodium 500 mg (06/14/18 0854)   PRN Meds:.acetaminophen, albuterol, bisacodyl, guaiFENesin, HYDROmorphone (DILAUDID) injection, labetalol, lip balm, LORazepam, polyethylene glycol, sennosides, sodium chloride flush   PHYSICAL EXAM: Vital signs: Vitals:   06/13/18 1620 06/13/18 1621 06/13/18 2052 06/14/18 0809  BP:      Pulse: (!) 103 95  (!) 109  Resp: (!) 23 (!) 28  18  Temp:   (!) 97.3 F (36.3 C)   TempSrc:   Oral   SpO2: 99% 100%  99%  Weight:      Height:       [employer]  06/10/18 0500 06/11/18 0500 06/13/18 0500  Weight: 47.3 kg 46.9 kg 46.7 kg   Body mass index is 18.83 kg/m.   General appearance:Awake, alert, not in any distress.  Eyes:no scleral icterus. HEENT: Atraumatic and Normocephalic Neck: supple, no JVD. Resp:Good air entry bilaterally,no rales or rhonchi CVS: S1 S2 regular, no murmurs.  GI: Bowel sounds present, Non tender and not distended with no gaurding, rigidity or rebound. Extremities: B/L Lower Ext shows no edema, both legs are warm to touch Neurology:  Non focal Musculoskeletal:No digital cyanosis Skin:No Rash, warm and dry Wounds:N/A  I have personally reviewed following labs and imaging studies  LABORATORY DATA: CBC: Recent Labs  Lab 06/07/18 1157  06/10/18 0412 06/11/18 0519 06/12/18 0355 06/13/18 0459 06/14/18 0322  WBC 14.9*   < > 22.7* 22.0* 16.0* 16.2* 13.3*  NEUTROABS 11.3*  --    --   --   --   --   --   HGB 7.3*   < > 7.3* 7.7* 7.6* 7.7* 7.3*  HCT 22.8*   < > 24.0* 25.5* 25.6* 26.1* 25.0*  MCV 80.6   < > 81.1 80.4 80.8 80.8 80.6  PLT 871*   < > 767* 895* 893* 861* 724*   < > = values in this interval not displayed.    Basic Metabolic Panel: Recent Labs  Lab 06/10/18 0412 06/11/18 0519 06/12/18 0355 06/13/18 0459 06/14/18 0322  NA 149* 148* 152* 148* 142  K 4.0 3.5 3.2* 3.5 3.6  CL 111 111 116* 116* 111  CO2 31 27 30 28 27   GLUCOSE 125* 153* 125* 112* 111*  BUN 26* 26* 33* 30* 24*  CREATININE 0.59 0.66 0.73 0.65 0.59  CALCIUM 9.3 9.4 9.5 9.5 9.0    GFR: Estimated Creatinine Clearance: 51.7 mL/min (by C-G formula based on SCr of 0.59 mg/dL).  Liver Function Tests: No results for input(s): AST, ALT, ALKPHOS, BILITOT, PROT, ALBUMIN in the last 168 hours. No results for input(s): LIPASE, AMYLASE in the last 168 hours. No results for input(s): AMMONIA in the last 168 hours.  Coagulation Profile: No results for input(s): INR, PROTIME in the last 168 hours.  Cardiac Enzymes: No results for input(s): CKTOTAL, CKMB, CKMBINDEX, TROPONINI in the last 168 hours.  BNP (last 3 results) No results for input(s): PROBNP in the last 8760 hours.  HbA1C: No results for input(s): HGBA1C in the last 72 hours.  CBG: Recent Labs  Lab 06/08/18 2029 06/09/18 0014 06/09/18 0815 06/09/18 1225 06/09/18 1635  GLUCAP 119* 127* 135* 131* 119*    Lipid Profile: No results for input(s): CHOL, HDL, LDLCALC, TRIG, CHOLHDL, LDLDIRECT in the last 72 hours.  Thyroid Function Tests: No results for input(s): TSH, T4TOTAL, FREET4, T3FREE, THYROIDAB in the last 72 hours.  Anemia Panel: Recent Labs    06/14/18 0322  VITAMINB12 1,509*  FOLATE 28.0  FERRITIN 202  TIBC 192*  IRON 52  RETICCTPCT 1.4    Urine analysis:    Component Value Date/Time   COLORURINE YELLOW 05/23/2018 0719   APPEARANCEUR CLEAR 05/23/2018 0719   LABSPEC 1.016 05/23/2018 0719   PHURINE  5.0 05/23/2018 0719   GLUCOSEU NEGATIVE 05/23/2018 0719   HGBUR SMALL (A) 05/23/2018 0719   BILIRUBINUR NEGATIVE 05/23/2018 0719   KETONESUR NEGATIVE 05/23/2018 0719   PROTEINUR NEGATIVE 05/23/2018 0719   NITRITE NEGATIVE 05/23/2018 0719   LEUKOCYTESUR NEGATIVE 05/23/2018 0719    Sepsis Labs: Lactic Acid, Venous    Component Value Date/Time   LATICACIDVEN 1.4 05/23/2018  1052    MICROBIOLOGY: Recent Results (from the past 240 hour(s))  Culture, blood (Routine X 2) w Reflex to ID Panel     Status: None   Collection Time: 06/04/18 11:15 AM  Result Value Ref Range Status   Specimen Description BLOOD LEFT ARM  Final   Special Requests   Final    BOTTLES DRAWN AEROBIC ONLY Blood Culture adequate volume   Culture   Final    NO GROWTH 5 DAYS Performed at [facility], [address], [place], [place] [address]    Report Status 06/09/2018 FINAL  Final  Culture, blood (Routine X 2) w Reflex to ID Panel     Status: None   Collection Time: 06/04/18 11:23 AM  Result Value Ref Range Status   Specimen Description BLOOD LEFT ANTECUBITAL  Final   Special Requests   Final    BOTTLES DRAWN AEROBIC ONLY Blood Culture results may not be optimal due to an inadequate volume of blood received in culture bottles   Culture   Final    NO GROWTH 5 DAYS Performed at [facility], [address], [place], [place] [address]    Report Status 06/09/2018 FINAL  Final  Culture, respiratory (non-expectorated)     Status: None   Collection Time: 06/04/18 11:54 AM  Result Value Ref Range Status   Specimen Description TRACHEAL ASPIRATE  Final   Special Requests Normal  Final   Gram Stain   Final    MODERATE WBC PRESENT, PREDOMINANTLY PMN NO ORGANISMS SEEN Performed at [facility], [address] [place], [place] [address]    Culture FEW CANDIDA ALBICANS  Final   Report Status 06/06/2018 FINAL  Final    RADIOLOGY STUDIES/RESULTS: Dg Chest 1 View  Result Date:  05/23/2018 CLINICAL DATA:  Aspiration pneumonia. Acute hypoxemic respiratory failure. Central line placement. EXAM: CHEST  1 VIEW COMPARISON:  Prior today FINDINGS: A new right jugular central venous catheter is seen with tip overlying the distal SVC. Endotracheal tube and nasogastric tube remain in appropriate position. No pneumothorax visualized. Worsening airspace disease is seen throughout the right lung. Mild airspace disease in the medial left lower lung also shows mild worsening. IMPRESSION: New right jugular central venous catheter in appropriate position. No pneumothorax visualized. Interval worsening of airspace disease throughout the right lung and in the medial left lower lobe. Electronically Signed   By: Myles Rosenthal M.D.   On: 05/23/2018 23:07   Ct Head Wo Contrast  Result Date: 05/23/2018 CLINICAL DATA:  Pulseless arrest, status post CPR. EXAM: CT HEAD WITHOUT CONTRAST CT CERVICAL SPINE WITHOUT CONTRAST TECHNIQUE: Multidetector CT imaging of the head and cervical spine was performed following the standard protocol without intravenous contrast. Multiplanar CT image reconstructions of the cervical spine were also generated. COMPARISON:  None. FINDINGS: CT HEAD FINDINGS Brain: There is no mass, hemorrhage or extra-axial collection. The size and configuration of the ventricles and extra-axial CSF spaces are normal. There is no acute or chronic infarction. The brain parenchyma is normal. Vascular: No abnormal hyperdensity of the major intracranial arteries or dural venous sinuses. No intracranial atherosclerosis. Skull: The visualized skull base, calvarium and extracranial soft tissues are normal. Sinuses/Orbits: Fluid layering in the maxillary and sphenoid sinuses. The orbits are normal. CT CERVICAL SPINE FINDINGS Alignment: There is grade 1 anterolisthesis at C3-4, C4-5 and C5-6 secondary to facet hypertrophy. There is normal facet alignment. Lateral masses of C1 and C2 and the occipital condyles  are  aligned. Skull base and vertebrae: No acute fracture. Soft tissues and spinal canal: No prevertebral fluid or swelling. No visible canal hematoma. Disc levels: No advanced spinal canal or neural foraminal stenosis. Upper chest: Large area of consolidation in the right upper lobe. Other: Patient is intubated with the endotracheal tube tip beyond the field of view. IMPRESSION: 1. No acute intracranial abnormality. 2. No acute fracture of the cervical spine. 3. Large area of right upper lobe consolidation, possibly secondary to aspiration. Electronically Signed   By: Deatra Robinson M.D.   On: 05/23/2018 06:34   Ct Cervical Spine Wo Contrast  Result Date: 05/23/2018 CLINICAL DATA:  Pulseless arrest, status post CPR. EXAM: CT HEAD WITHOUT CONTRAST CT CERVICAL SPINE WITHOUT CONTRAST TECHNIQUE: Multidetector CT imaging of the head and cervical spine was performed following the standard protocol without intravenous contrast. Multiplanar CT image reconstructions of the cervical spine were also generated. COMPARISON:  None. FINDINGS: CT HEAD FINDINGS Brain: There is no mass, hemorrhage or extra-axial collection. The size and configuration of the ventricles and extra-axial CSF spaces are normal. There is no acute or chronic infarction. The brain parenchyma is normal. Vascular: No abnormal hyperdensity of the major intracranial arteries or dural venous sinuses. No intracranial atherosclerosis. Skull: The visualized skull base, calvarium and extracranial soft tissues are normal. Sinuses/Orbits: Fluid layering in the maxillary and sphenoid sinuses. The orbits are normal. CT CERVICAL SPINE FINDINGS Alignment: There is grade 1 anterolisthesis at C3-4, C4-5 and C5-6 secondary to facet hypertrophy. There is normal facet alignment. Lateral masses of C1 and C2 and the occipital condyles are aligned. Skull base and vertebrae: No acute fracture. Soft tissues and spinal canal: No prevertebral fluid or swelling. No visible canal  hematoma. Disc levels: No advanced spinal canal or neural foraminal stenosis. Upper chest: Large area of consolidation in the right upper lobe. Other: Patient is intubated with the endotracheal tube tip beyond the field of view. IMPRESSION: 1. No acute intracranial abnormality. 2. No acute fracture of the cervical spine. 3. Large area of right upper lobe consolidation, possibly secondary to aspiration. Electronically Signed   By: Deatra Robinson M.D.   On: 05/23/2018 06:34   Dg Chest Port 1 View  Result Date: 06/10/2018 CLINICAL DATA:  Fall pneumonia EXAM: PORTABLE CHEST 1 VIEW COMPARISON:  06/08/2018 FINDINGS: Right-sided PICC line is again noted and stable. The endotracheal tube is been removed in the interval. The nasogastric catheter has been exchanged for a feeding tube. Patchy infiltrates are again noted bilaterally right greater than left. No bony abnormality is noted. IMPRESSION: Stable patchy infiltrates. Electronically Signed   By: Alcide Clever M.D.   On: 06/10/2018 10:55   Dg Chest Port 1 View  Result Date: 06/08/2018 CLINICAL DATA:  Acute respiratory failure with hypoxemia EXAM: PORTABLE CHEST 1 VIEW COMPARISON:  06/07/2018 FINDINGS: Cardiac shadow is stable. Endotracheal tube, nasogastric catheter and right-sided PICC line are again seen and stable. Patchy infiltrates are again identified throughout both lungs right greater than left. No sizable effusion is seen. No bony abnormality is noted. IMPRESSION: Stable patchy infiltrates bilaterally right greater than left. Tubes and lines as described. Electronically Signed   By: Alcide Clever M.D.   On: 06/08/2018 07:24   Dg Chest Port 1 View  Result Date: 06/07/2018 CLINICAL DATA:  Acute respiratory failure with hypoxemia EXAM: PORTABLE CHEST 1 VIEW COMPARISON:  06/06/2018 FINDINGS: Multifocal patchy opacities, right lung predominant, grossly unchanged. No pleural effusion or pneumothorax. Endotracheal tube terminates 8 cm above the carina.  Right  arm PICC terminates at the cavoatrial junction. Enteric tube courses into the stomach. IMPRESSION: Multifocal pneumonia, right lobe predominant, unchanged. Endotracheal tube terminates 8 cm above the carina. Additional support apparatus as above. Electronically Signed   By: Charline Bills M.D.   On: 06/07/2018 08:36   Dg Chest Port 1 View  Result Date: 06/06/2018 CLINICAL DATA:  Acute respiratory failure.  Hypoxemia. EXAM: PORTABLE CHEST 1 VIEW COMPARISON:  June 05, 2018 FINDINGS: The ETT and right PICC line are stable. The NG tube terminates below today's film. Right greater than left patchy pulmonary infiltrates are similar in the interval. No other interval changes. IMPRESSION: Multifocal pneumonia remains, similar in the interval. Stable support apparatus. Electronically Signed   By: Gerome Sam III M.D   On: 06/06/2018 08:42   Dg Chest Port 1 View  Result Date: 06/05/2018 CLINICAL DATA:  Respiratory fail, aspiration pneumonia EXAM: PORTABLE CHEST 1 VIEW COMPARISON:  Portable exam 0429 hours compared to 06/04/2018 FINDINGS: Tip of endotracheal tube projects 4.3 cm above carina. Nasogastric tube extends into stomach. RIGHT arm PICC line tip projects over SVC. Normal heart size mediastinal contours. Patchy BILATERAL airspace infiltrates consistent with multifocal pneumonia, RIGHT greater than LEFT. Infiltrates appear mildly improved versus previous exam. No pleural effusion or pneumothorax. IMPRESSION: Multifocal pneumonia, with mildly improved aeration versus previous exam. Electronically Signed   By: Ulyses Southward M.D.   On: 06/05/2018 10:10   Dg Chest Port 1 View  Result Date: 06/04/2018 CLINICAL DATA:  67 year old with respiratory failure. EXAM: PORTABLE CHEST 1 VIEW COMPARISON:  06/03/2018 FINDINGS: Endotracheal tube is 3.3 cm above the carina. Nasogastric tube extends into the abdomen. PICC line tip in the lower SVC region. Patchy parenchymal disease and airspace disease in the right  lung has slightly improved, particularly at the right lung base. Probable right pleural effusion. However, there are new or worsening patchy airspace densities along the periphery of the left lung. Heart size is within normal limits and stable. Negative for a pneumothorax. IMPRESSION: 1. New or worsening airspace disease in left lung. Findings are concerning for left lung pneumonia. 2. Persistent patchy airspace disease in the right lung is most compatible with pneumonia with slightly improved aeration at the right lung base. 3. Probable small pleural effusions. 4. Visualized support apparatuses are appropriately positioned. Electronically Signed   By: Richarda Overlie M.D.   On: 06/04/2018 10:13   Dg Chest Port 1 View  Result Date: 06/03/2018 CLINICAL DATA:  Acute respiratory failure with hypoxemia EXAM: PORTABLE CHEST 1 VIEW COMPARISON:  06/02/2018 FINDINGS: The patient is rotated to the right on today's radiograph, reducing diagnostic sensitivity and specificity. Endotracheal tube tip 2.8 cm above the carina, satisfactorily position. A nasogastric tube extends below the inferior margin of the radiograph. Right PICC line tip: Lower SVC. Continued extensive airspace opacity in the right lung, worsened at the right lung base compared to prior. There is some mild bandlike density in the left mid lung, increased in conspicuity. Heart size within normal limits. IMPRESSION: 1. Worsened airspace opacity at the right lung base with continued notable right upper lobe airspace opacity. 2. Mild increase in bandlike density in the left mid lung, probably atelectasis. 3. Support apparatus satisfactorily position. Electronically Signed   By: Gaylyn Rong M.D.   On: 06/03/2018 10:28   Dg Chest Port 1 View  Result Date: 06/02/2018 CLINICAL DATA:  Aspiration into the airway. EXAM: PORTABLE CHEST 1 VIEW COMPARISON:  Most recent comparison yesterday at 0507 hour FINDINGS:  Patient is significantly rotated. Endotracheal tube  approximately 15 mm from the carina. Enteric tube in place with tip and side-port below the diaphragm. Right internal jugular central venous catheter in the mid SVC. Right upper extremity PICC with tip in the distal SVC. Patchy opacities throughout the right lung, most confluent in the lower lobe without significant interval change allowing for differences in technique. Slight improvement in left lung base opacity. Heart size and mediastinal contours are grossly unchanged, partially obscured by rotation. No pneumothorax. IMPRESSION: 1. No significant change in patchy opacities throughout the right lung allowing for differences in technique and rotation. 2. New right upper extremity PICC with tip in the distal SVC. Borderline low positioning of endotracheal tube tip 15 mm from the carina. 3. Slight improving left basilar aeration. Electronically Signed   By: Narda Rutherford M.D.   On: 06/02/2018 02:36   Dg Chest Port 1 View  Result Date: 06/01/2018 CLINICAL DATA:  67 year old female with pneumonia. Subsequent encounter. EXAM: PORTABLE CHEST 1 VIEW COMPARISON:  05/30/2018 chest x-ray. FINDINGS: Endotracheal tube tip 3.9 cm above the carina. Right central line tip mid superior vena cava level. No pneumothorax detected. Nasogastric tube courses below the diaphragm. Tip is not included on the present exam. Minimal improvement of consolidation right lower lobe and patchy consolidation right upper lobe which may represent infectious infiltrate in proper clinical setting. Asymmetric pulmonary vascular congestion/mild pulmonary edema. Heart size within normal limits. IMPRESSION: 1. Minimal improvement of right lower lobe consolidation and patchy consolidation right upper lobe suggestive of result of pneumonia. 2. Asymmetric mild pulmonary vascular congestion/pulmonary edema without change. Electronically Signed   By: Lacy Duverney M.D.   On: 06/01/2018 07:22   Dg Chest Port 1 View  Result Date: 05/30/2018 CLINICAL  DATA:  Aspiration pneumonia. EXAM: PORTABLE CHEST 1 VIEW COMPARISON:  One-view chest x-ray 05/29/2018 FINDINGS: Heart size is normal. Endotracheal tube terminates 3 cm above the carina. NG tube is in the stomach. Right IJ line is stable. Right lower lobe airspace disease is again seen. Mild edema is slightly increased. Minimal left basilar atelectasis is stable. IMPRESSION: 1. Persistent right lower lobe airspace disease compatible with pneumonia or aspiration. 2. Slight progression in edema and left basilar atelectasis. Electronically Signed   By: Marin Roberts M.D.   On: 05/30/2018 07:42   Dg Chest Port 1 View  Result Date: 05/29/2018 CLINICAL DATA:  Respiratory failure EXAM: PORTABLE CHEST 1 VIEW COMPARISON:  05/28/2018 FINDINGS: Cardiac shadow is stable. Right jugular central line, endotracheal tube and nasogastric catheter are again seen and stable. The left lung is predominantly clear although some left retrocardiac infiltrate is seen. Diffuse infiltrates are noted throughout the right lung stable in appearance when compared with the prior exam. Small right pleural effusion is noted as well. IMPRESSION: Stable bilateral infiltrates right greater than left with associated right effusion. Tubes and lines as described. Electronically Signed   By: Alcide Clever M.D.   On: 05/29/2018 07:07   Dg Chest Port 1 View  Result Date: 05/28/2018 CLINICAL DATA:  Pneumonia. EXAM: PORTABLE CHEST 1 VIEW COMPARISON:  05/28/2018. FINDINGS: Endotracheal tube, NG tube, right IJ line in stable position. Heart size stable. Diffuse bilateral pulmonary infiltrates, particular on the right side, and right-sided pleural effusion again noted. Similar findings noted on prior exam. IMPRESSION: 1.  Lines and tubes stable position. 2. Diffuse bilateral pulmonary infiltrates, right side greater than left. Right-sided pleural effusion. Similar findings on prior exam. Electronically Signed   By: Maisie Fus  Register   On: 05/28/2018  11:47   Dg Chest Portable 1 View  Result Date: 05/28/2018 CLINICAL DATA:  ARDS, respiratory failure and aspiration pneumonia. EXAM: PORTABLE CHEST 1 VIEW COMPARISON:  05/27/2018 FINDINGS: Endotracheal tube remains with the tip projecting approximately 4 cm above the carina. Right jugular central line shows stable positioning with the tip in the lower SVC. Gastric decompression tube extends into the stomach. Relatively stable airspace consolidation bilaterally, right greater than left with potentially mildly improved aeration at the right base. No significant pleural effusions. No pneumothorax. Stable heart size. IMPRESSION: Relatively stable bilateral airspace consolidation with potentially slightly improved aeration at the right base. Electronically Signed   By: Irish Lack M.D.   On: 05/28/2018 07:53   Dg Chest Port 1 View  Result Date: 05/27/2018 CLINICAL DATA:  Shortness of breath, ARDS EXAM: PORTABLE CHEST 1 VIEW COMPARISON:  Portable chest x-ray of 05/26/2018 FINDINGS: There is slightly better aeration of the right upper lung. There appear to be bilateral pleural effusions. Opacity at the right lung base remains most likely due to pneumonia. The tip of the endotracheal tube is approximatelymm 2.0 cm above the carina. NG tube extends into the stomach. IMPRESSION: 1. Tip of endotracheal tube 2.0 cm above the carina. 2. Slightly.  Aeration of the right upper lung. 3. Bilateral pleural effusions and basilar atelectasis. Cannot exclude pneumonia particularly the right lung base. Electronically Signed   By: Dwyane Dee M.D.   On: 05/27/2018 10:08   Dg Chest Port 1 View  Result Date: 05/26/2018 CLINICAL DATA:  Acute respiratory failure EXAM: PORTABLE CHEST 1 VIEW COMPARISON:  Portable chest x-ray of May 25, 2018 FINDINGS: There is developed further opacification of much of the right lung. The greatest density is inferiorly. On the left there is increasing interstitial and alveolar opacity in  the mid and lower lung. The heart borders are partially obscured. The heart is top-normal in size. The pulmonary vascularity is not clearly engorged. The endotracheal tube tip is at the carina. The esophagogastric tube tip and proximal port project below the GE junction. The right internal jugular venous catheter tip projects over the midportion of the SVC. IMPRESSION: Progressive opacification of both lungs worrisome for widespread pneumonia. Low positioning of the endotracheal tube. Withdrawal by 2 cm would help assure that the tube does not migrate into the right mainstem bronchus with patient movement. Electronically Signed   By: David  [place] M.D.   On: 05/26/2018 08:40   Dg Chest Port 1 View  Result Date: 05/25/2018 CLINICAL DATA:  Acute respiratory failure EXAM: PORTABLE CHEST 1 VIEW COMPARISON:  05/23/2018 FINDINGS: Endotracheal tube, nasogastric catheter and right jugular central line are again seen. Cardiac shadow is mildly enlarged but stable. Diffuse right-sided infiltrate is noted with some increasing consolidation in the lateral right lung base. Left retrocardiac infiltrate is noted as well. No pneumothorax is identified. IMPRESSION: Bilateral infiltrates right greater than left, increasing in the right lung base. Electronically Signed   By: Alcide Clever M.D.   On: 05/25/2018 07:24   Dg Chest Port 1 View  Result Date: 05/23/2018 CLINICAL DATA:  Suspected DVT.  Endotracheal tube. EXAM: PORTABLE CHEST 1 VIEW COMPARISON:  One-view chest x-ray 05/23/2018 at 4:13 a.m. FINDINGS: Heart size is normal. Pacing wires are in place. NG tube courses off the inferior border of the film. The endotracheal tube has been pulled back, now terminating 4.5 cm above the carina. Right middle lobe and lower lobe airspace disease is again seen. Asymmetric  edema is present on the right. IMPRESSION: 1. Interval adjustment of endotracheal tube, now in satisfactory position. 2. Right middle and lower lobe airspace  disease. This is concerning for infection versus aspiration. Underlying mass is not excluded. Electronically Signed   By: Marin Roberts M.D.   On: 05/23/2018 07:28   Dg Chest Portable 1 View  Result Date: 05/23/2018 CLINICAL DATA:  Intubation EXAM: PORTABLE CHEST 1 VIEW COMPARISON:  None. FINDINGS: Endotracheal tube tip is within the proximal right mainstem bronchus. This should be retracted by 4.5 cm to place it at the level of the clavicular heads. There is a large area of right parahilar consolidation. The left lung is clear. No pleural effusion or pneumothorax. The nasogastric tube side port is below the diaphragm but not clearly visualized. A dedicated abdominal radiograph may be helpful. IMPRESSION: 1. Endotracheal tube tip in the proximal right mainstem bronchus. Retraction by 4 cm recommended. 2. Nasogastric tube side port below the diaphragm but not clearly visualized. Dedicated abdominal radiograph may be helpful. 3. Large area of right parahilar and medial right lung apex consolidation which may indicate infection or neoplastic process. Chest CT or Followup PA and lateral chest X-ray is recommended in 3-4 weeks following trial of antibiotic therapy to ensure resolution and exclude underlying malignancy. These results were called by telephone at the time of interpretation on 05/23/2018 at 5:18 am to Dr. Jaci Carrel , who verbally acknowledged these results. Electronically Signed   By: Deatra Robinson M.D.   On: 05/23/2018 05:18   Dg Abd Portable 1v  Result Date: 06/08/2018 CLINICAL DATA:  Nasogastric tube placement. EXAM: PORTABLE ABDOMEN - 1 VIEW COMPARISON:  Abdominal radiograph May 23, 2018 FINDINGS: Feeding tube tip projects in second portion of duodenum. Included bowel gas pattern is nondilated and nonobstructive. No intra-abdominal mass effect. Reticulonodular densities and alveolar airspace opacities included lung bases. Soft tissue planes and included osseous structures  are non suspicious. IMPRESSION: Feeding tube tip projects in duodenum. Electronically Signed   By: Awilda Metro M.D.   On: 06/08/2018 16:09   Dg Abd Portable 1v  Result Date: 05/23/2018 CLINICAL DATA:  OG tube placement. EXAM: PORTABLE ABDOMEN - 1 VIEW COMPARISON:  One-view chest x-ray of the same day. FINDINGS: OG tube is now in place. The side port is in the mid stomach. Pacing leads are stable. Right middle and lower lobe pneumonia are stable. IMPRESSION: 1. Satisfactory positioning of OG tube in the stomach. 2. Right lower lobe pneumonia. Electronically Signed   By: Marin Roberts M.D.   On: 05/23/2018 10:44   [place] Ekg Site Rite  Result Date: 06/01/2018 If Site Rite image not attached, placement could not be confirmed due to current cardiac rhythm.    LOS: 22 days   Jeoffrey Massed, MD  [facility]  If 7PM-7AM, please contact night-coverage  Please page via [email or web address]-click on MD name and type text message  06/14/2018, 10:08 AM

## 2018-06-14 NOTE — Progress Notes (Signed)
  Speech Language Pathology Treatment: Dysphagia  Patient Details Name: Courtney Beard MRN: [id number] DOB: 09/12/1950 Today's Date: 06/14/2018 Time: [id number] SLP Time Calculation (min) (ACUTE ONLY): 11 min  Assessment / Plan / Recommendation Clinical Impression  RN requested patient be seen today for potential initiation of pos. Per report, patient alert and requesting water. Unfortunately, despite reports of alert state this am, patient extremely drowsy during SLP diagnostic treatment. Eyes opened consistently to auditory stimuli however patient unable to maintain alert state. Oral care provided to maximize safety with po trials and patient was able to demonstrate initial awareness of bolus, opening oral cavity and accepting ice chip . Despite max cueing however, patient unable to sustain attention and alert state for oral manipulation, oral transit, and initiation of pharyngeal swallow. Oral cavity eventually suctioned. Note that vocal quality continues to remain hoarse as well as wet at baseline indicative of decreased secretion management. Hopeful that with time off ventilator and improved alertness, patient will be able to initiate po intake. Will plan to f/u 11/4.     HPI HPI: 67 y/o female admitted 05/23/18 due to post cardiac arrest in setting of presumed intentional overdose with benzodiazepine and muscle relaxants.  Bystander CPR initiated, has aspiration pneumonia and ARDS. ETT 10/12-28. Has cortrak.  Since extubation she has remained confused and periodically agitated.  She has copious secretions but has difficulty expectorating them effectively and has required frequent suctioning NT suctioning.       SLP Plan  Continue with current plan of care       Recommendations  Diet recommendations: NPO Medication Administration: Via alternative means                Oral Care Recommendations: Oral care QID SLP Visit Diagnosis: Dysphagia, oropharyngeal phase (R13.12) Plan:  Continue with current plan of care       Jerri Hargadon MA, CCC-SLP     Courtney Beard 06/14/2018, 11:13 AM

## 2018-06-15 DIAGNOSIS — G4700 Insomnia, unspecified: Secondary | ICD-10-CM

## 2018-06-15 DIAGNOSIS — D62 Acute posthemorrhagic anemia: Secondary | ICD-10-CM

## 2018-06-15 DIAGNOSIS — D72829 Elevated white blood cell count, unspecified: Secondary | ICD-10-CM

## 2018-06-15 DIAGNOSIS — F332 Major depressive disorder, recurrent severe without psychotic features: Secondary | ICD-10-CM

## 2018-06-15 DIAGNOSIS — F1010 Alcohol abuse, uncomplicated: Secondary | ICD-10-CM

## 2018-06-15 DIAGNOSIS — T424X2A Poisoning by benzodiazepines, intentional self-harm, initial encounter: Secondary | ICD-10-CM

## 2018-06-15 DIAGNOSIS — F329 Major depressive disorder, single episode, unspecified: Secondary | ICD-10-CM

## 2018-06-15 DIAGNOSIS — F1011 Alcohol abuse, in remission: Secondary | ICD-10-CM

## 2018-06-15 DIAGNOSIS — F419 Anxiety disorder, unspecified: Secondary | ICD-10-CM

## 2018-06-15 DIAGNOSIS — T1491XA Suicide attempt, initial encounter: Secondary | ICD-10-CM

## 2018-06-15 DIAGNOSIS — T428X2A Poisoning by antiparkinsonism drugs and other central muscle-tone depressants, intentional self-harm, initial encounter: Principal | ICD-10-CM

## 2018-06-15 LAB — BASIC METABOLIC PANEL
ANION GAP: 4 — AB (ref 5–15)
BUN: 19 mg/dL (ref 8–23)
CHLORIDE: 108 mmol/L (ref 98–111)
CO2: 28 mmol/L (ref 22–32)
Calcium: 9 mg/dL (ref 8.9–10.3)
Creatinine, Ser: 0.71 mg/dL (ref 0.44–1.00)
GFR calc non Af Amer: >60 mL/min (ref >60)
Glucose, Bld: 93 mg/dL (ref 70–99)
Potassium: 3.7 mmol/L (ref 3.5–5.1)
Sodium: 140 mmol/L (ref 135–145)

## 2018-06-15 LAB — CBC
HEMATOCRIT: 24.6 % — AB (ref 36.0–46.0)
HEMOGLOBIN: 7.4 g/dL — AB (ref 12.0–15.0)
MCH: 24.1 pg — ABNORMAL LOW (ref 26.0–34.0)
MCHC: 30.1 g/dL (ref 30.0–36.0)
MCV: 80.1 fL (ref 80.0–100.0)
Platelets: 674 10*3/uL — ABNORMAL HIGH (ref 150–400)
RBC: 3.07 MIL/uL — ABNORMAL LOW (ref 3.87–5.11)
RDW: 15.6 % — ABNORMAL HIGH (ref 11.5–15.5)
WBC: 11 10*3/uL — AB (ref 4.0–10.5)
nRBC: 0 % (ref 0.0–0.2)

## 2018-06-15 MED ORDER — LORAZEPAM 2 MG/ML IJ SOLN: 0.5000 mg | Freq: Two times a day (BID) | INTRAMUSCULAR | Status: DC | PRN

## 2018-06-15 MED ORDER — JEVITY 1.2 CAL PO LIQD
1000.0000 mL | ORAL | Status: DC
  Administered 2018-06-15 – 2018-06-19 (×3): 1000 mL
  Filled 2018-06-15 (×8): qty 1000

## 2018-06-15 MED ORDER — HYDROMORPHONE HCL 1 MG/ML IJ SOLN: 0.5000 mg | Freq: Three times a day (TID) | INTRAMUSCULAR | Status: DC | PRN

## 2018-06-15 NOTE — Progress Notes (Signed)
PROGRESS NOTE        PATIENT DETAILS Name: Courtney Beard Age: 67 y.o. Sex: female Date of Birth: 1950-11-22 Admit Date: 05/23/2018 Admitting Physician Courtney Ivan, MD ZOX:[id number], Courtney Masters, MD  Brief Narrative: Patient is a 67 y.o. female with history of EtOH use, anxiety/depression presented with a cardiac arrest in the setting of overdose and aspiration pneumonia resulting in ARDS and prolonged ventilator dependence.  Per H&P, patient's husband found the patient on day of admission unresponsive-covered in emesis with no palpable pulse- with empty bottles of Klonopin and Zanaflex.  Patient was managed in the ICU, was on broad-spectrum antimicrobial therapy-finally extubated on 10/28.  Further hospital course has been complicated by delirium, dysphagia requiring CorPak tube placement.  Mental status slowly improving with supportive care-remains n.p.o with NG feedings in place--see below for further details.  Subjective: Mental status continues to improve and she is awake and alert this morning.  Per RN does have episodes of intermittent delirium mostly at night.  Assessment/Plan: Septic shock with acute hypoxic respiratory failure with ARDS secondary to aspiration pneumonia/HCAP: Sepsis pathophysiology has improved-required prolonged ventilator dependence-extubated on 10/28.  Has already completed a course of antimicrobial therapy.  Although Candida seen in sputum culture, this is thought to be a colonization/contamination rather than a true infection.  Continue supportive care-incentive spirometry/flutter valve.  Continue to titrate off oxygen as tolerated.   Acute toxic/metabolic encephalopathy: Multifactorial-secondary to hypoxia/sedatives/narcotics-rapidly improving. Continue Depakote/Seroquel-discontinue Dilaudid today-minimize benzodiazepines as much as possible.     Sinus tachycardia: Secondary to acute illness-numerous physiologic stressors and anemia.   Tachycardia improving-continue telemetry monitoring.  However since overall improved-we will downgrade from aggressive care to telemetry status.  Hypernatremia: Resolved.  Secondary to free water deficit-resolved with increase in free water flushes.  To new free water flushes 100 cc every 4 hours.  Follow electrolytes periodically.   Hypokalemia: Repleted.  Dysphagia: Related to prolonged ventilator dependent/ET tube placement-voice quality has improved-able to cough and clear airway this morning-speech eval in progress-plans are to repeat FEES today.  Anemia: Secondary to critical illness-no overt evidence of blood loss.  Follow periodically.  Anemia panel not consistent with iron deficiency.  Is completely asymptomatic.  Plans are to transfuse only if hemoglobin less than 7.   Thrombocytosis: Likely reactive-no indication for iron deficiency on lab data-follow CBC  Suspected intentional drug overdose: Status is rapidly improving-when asked if she remembers taking Zanaflex and Klonopin-she claims not to remember.  Mental status has improved-have placed a inpatient psych consult today.  Sitter remains in place.   Deconditioning/debility: Secondary to critical illness-appreciate PT eval-have consulted CIR to see if the patient is a candidate for CIR.  DVT Prophylaxis: Prophylactic Heparin  Code Status: Full code  Family Communication: None at bedside  Disposition Plan: Remain inpatient  Antimicrobial agents: Anti-infectives (From admission, onward)   Start     Dose/Rate Route Frequency Ordered Stop   06/08/18 0100  vancomycin (VANCOCIN) IVPB 750 mg/150 ml premix     750 mg 150 mL/hr over 60 Minutes Intravenous Every 12 hours 06/07/18 1307 06/11/18 1341   06/06/18 0100  vancomycin (VANCOCIN) 500 mg in sodium chloride 0.9 % 100 mL IVPB  Status:  Discontinued     500 mg 100 mL/hr over 60 Minutes Intravenous Every 12 hours 06/05/18 1119 06/07/18 1307   06/05/18 1200  meropenem  (MERREM) 1  g in sodium chloride 0.9 % 100 mL IVPB     1 g 200 mL/hr over 30 Minutes Intravenous Every 8 hours 06/05/18 1119 06/11/18 2229   06/05/18 1130  vancomycin (VANCOCIN) IVPB 1000 mg/200 mL premix     1,000 mg 200 mL/hr over 60 Minutes Intravenous  Once 06/05/18 1119 06/05/18 1330   05/31/18 1000  ceFEPIme (MAXIPIME) 2 g in sodium chloride 0.9 % 100 mL IVPB  Status:  Discontinued     2 g 200 mL/hr over 30 Minutes Intravenous Every 12 hours 05/31/18 0903 06/05/18 1109   05/31/18 1000  vancomycin (VANCOCIN) 500 mg in sodium chloride 0.9 % 100 mL IVPB  Status:  Discontinued     500 mg 100 mL/hr over 60 Minutes Intravenous Every 12 hours 05/31/18 0903 06/02/18 1039   05/26/18 1300  cefTRIAXone (ROCEPHIN) 1 g in sodium chloride 0.9 % 100 mL IVPB  Status:  Discontinued     1 g 200 mL/hr over 30 Minutes Intravenous Every 24 hours 05/26/18 1031 05/31/18 0840   05/25/18 2200  vancomycin (VANCOCIN) IVPB 750 mg/150 ml premix  Status:  Discontinued     750 mg 150 mL/hr over 60 Minutes Intravenous Every 12 hours 05/25/18 1136 05/25/18 1348   05/24/18 2200  vancomycin (VANCOCIN) 500 mg in sodium chloride 0.9 % 100 mL IVPB  Status:  Discontinued     500 mg 100 mL/hr over 60 Minutes Intravenous Every 12 hours 05/24/18 0953 05/25/18 1136   05/24/18 1100  vancomycin (VANCOCIN) IVPB 1000 mg/200 mL premix     1,000 mg 200 mL/hr over 60 Minutes Intravenous  Once 05/24/18 0953 05/24/18 1109   05/23/18 0715  piperacillin-tazobactam (ZOSYN) IVPB 3.375 g  Status:  Discontinued     3.375 g 12.5 mL/hr over 240 Minutes Intravenous Every 8 hours 05/23/18 0706 05/26/18 0943      Procedures: ETT 10/12>>10/28 PICC 10/21  CONSULTS:  pulmonary/intensive care  Time spent: 25  minutes-Greater than 50% of this time was spent in counseling, explanation of diagnosis, planning of further management, and coordination of care.  MEDICATIONS: Scheduled Meds: . Chlorhexidine Gluconate Cloth  6 each Topical Daily   . famotidine  20 mg Oral Daily  . folic acid  1 mg Per Tube Daily  . free water  100 mL Per Tube Q4H  . heparin  5,000 Units Subcutaneous Q8H  . mouth rinse  15 mL Mouth Rinse BID  . potassium chloride  40 mEq Per Tube BID  . QUEtiapine  50 mg Per Tube BID  . sodium chloride flush  10-40 mL Intracatheter Q12H  . thiamine  100 mg Per Tube Daily   Continuous Infusions: . sodium chloride 10 mL/hr at 06/14/18 1334  . feeding supplement (JEVITY 1.2 CAL) 1,000 mL (06/14/18 2234)  . valproate sodium 500 mg (06/14/18 2218)   PRN Meds:.acetaminophen, albuterol, bisacodyl, guaiFENesin, HYDROmorphone (DILAUDID) injection, labetalol, lip balm, LORazepam, polyethylene glycol, sennosides, sodium chloride flush   PHYSICAL EXAM: Vital signs: Vitals:   06/14/18 1931 06/15/18 0030 06/15/18 0400 06/15/18 0456  BP: 128/79 114/70 (!) 107/58   Pulse: 100 100 98   Resp: (!) 24 19 (!) 27   Temp: 98.5 F (36.9 C) 98.4 F (36.9 C) 98.5 F (36.9 C)   TempSrc: Oral Oral Oral   SpO2: 100% 98% 93%   Weight:    45.8 kg  Height:       Filed Weights   06/11/18 0500 06/13/18 0500 06/15/18 0456  Weight: 46.9 kg  46.7 kg 45.8 kg   Body mass index is 18.47 kg/m.   General appearance:Awake, alert, not in any distress.  Frail and chronically sick appearing. Eyes:no scleral icterus. HEENT: Atraumatic and Normocephalic Neck: supple, no JVD. Resp:Good air entry bilaterally, added sounds heard anteriorly CVS: S1 S2 regular, no murmurs.  GI: Bowel sounds present, Non tender and not distended with no gaurding, rigidity or rebound. Extremities: B/L Lower Ext shows no edema, both legs are warm to touch Neurology:  Non focal-has generalized weakness Psychiatric: Normal judgment and insight. Normal mood. Musculoskeletal:No digital cyanosis Skin:No Rash, warm and dry Wounds:N/A  I have personally reviewed following labs and imaging studies  LABORATORY DATA: CBC: Recent Labs  Lab 06/11/18 0519  06/12/18 0355 06/13/18 0459 06/14/18 0322 06/15/18 0320  WBC 22.0* 16.0* 16.2* 13.3* 11.0*  HGB 7.7* 7.6* 7.7* 7.3* 7.4*  HCT 25.5* 25.6* 26.1* 25.0* 24.6*  MCV 80.4 80.8 80.8 80.6 80.1  PLT 895* 893* 861* 724* 674*    Basic Metabolic Panel: Recent Labs  Lab 06/11/18 0519 06/12/18 0355 06/13/18 0459 06/14/18 0322 06/15/18 0320  NA 148* 152* 148* 142 140  K 3.5 3.2* 3.5 3.6 3.7  CL 111 116* 116* 111 108  CO2 27 30 28 27 28   GLUCOSE 153* 125* 112* 111* 93  BUN 26* 33* 30* 24* 19  CREATININE 0.66 0.73 0.65 0.59 0.71  CALCIUM 9.4 9.5 9.5 9.0 9.0    GFR: Estimated Creatinine Clearance: 50.7 mL/min (by C-G formula based on SCr of 0.71 mg/dL).  Liver Function Tests: No results for input(s): AST, ALT, ALKPHOS, BILITOT, PROT, ALBUMIN in the last 168 hours. No results for input(s): LIPASE, AMYLASE in the last 168 hours. No results for input(s): AMMONIA in the last 168 hours.  Coagulation Profile: No results for input(s): INR, PROTIME in the last 168 hours.  Cardiac Enzymes: No results for input(s): CKTOTAL, CKMB, CKMBINDEX, TROPONINI in the last 168 hours.  BNP (last 3 results) No results for input(s): PROBNP in the last 8760 hours.  HbA1C: No results for input(s): HGBA1C in the last 72 hours.  CBG: Recent Labs  Lab 06/08/18 2029 06/09/18 0014 06/09/18 0815 06/09/18 1225 06/09/18 1635  GLUCAP 119* 127* 135* 131* 119*    Lipid Profile: No results for input(s): CHOL, HDL, LDLCALC, TRIG, CHOLHDL, LDLDIRECT in the last 72 hours.  Thyroid Function Tests: No results for input(s): TSH, T4TOTAL, FREET4, T3FREE, THYROIDAB in the last 72 hours.  Anemia Panel: Recent Labs    06/14/18 0322  VITAMINB12 1,509*  FOLATE 28.0  FERRITIN 202  TIBC 192*  IRON 52  RETICCTPCT 1.4    Urine analysis:    Component Value Date/Time   COLORURINE YELLOW 05/23/2018 0719   APPEARANCEUR CLEAR 05/23/2018 0719   LABSPEC 1.016 05/23/2018 0719   PHURINE 5.0 05/23/2018 0719    GLUCOSEU NEGATIVE 05/23/2018 0719   HGBUR SMALL (A) 05/23/2018 0719   BILIRUBINUR NEGATIVE 05/23/2018 0719   KETONESUR NEGATIVE 05/23/2018 0719   PROTEINUR NEGATIVE 05/23/2018 0719   NITRITE NEGATIVE 05/23/2018 0719   LEUKOCYTESUR NEGATIVE 05/23/2018 0719    Sepsis Labs: Lactic Acid, Venous    Component Value Date/Time   LATICACIDVEN 1.4 05/23/2018 1052    MICROBIOLOGY: No results found for this or any previous visit (from the past 240 hour(s)).  RADIOLOGY STUDIES/RESULTS: Dg Chest 1 View  Result Date: 05/23/2018 CLINICAL DATA:  Aspiration pneumonia. Acute hypoxemic respiratory failure. Central line placement. EXAM: CHEST  1 VIEW COMPARISON:  Prior today FINDINGS: A new right  jugular central venous catheter is seen with tip overlying the distal SVC. Endotracheal tube and nasogastric tube remain in appropriate position. No pneumothorax visualized. Worsening airspace disease is seen throughout the right lung. Mild airspace disease in the medial left lower lung also shows mild worsening. IMPRESSION: New right jugular central venous catheter in appropriate position. No pneumothorax visualized. Interval worsening of airspace disease throughout the right lung and in the medial left lower lobe. Electronically Signed   By: Myles Rosenthal M.D.   On: 05/23/2018 23:07   Ct Head Wo Contrast  Result Date: 05/23/2018 CLINICAL DATA:  Pulseless arrest, status post CPR. EXAM: CT HEAD WITHOUT CONTRAST CT CERVICAL SPINE WITHOUT CONTRAST TECHNIQUE: Multidetector CT imaging of the head and cervical spine was performed following the standard protocol without intravenous contrast. Multiplanar CT image reconstructions of the cervical spine were also generated. COMPARISON:  None. FINDINGS: CT HEAD FINDINGS Brain: There is no mass, hemorrhage or extra-axial collection. The size and configuration of the ventricles and extra-axial CSF spaces are normal. There is no acute or chronic infarction. The brain parenchyma is  normal. Vascular: No abnormal hyperdensity of the major intracranial arteries or dural venous sinuses. No intracranial atherosclerosis. Skull: The visualized skull base, calvarium and extracranial soft tissues are normal. Sinuses/Orbits: Fluid layering in the maxillary and sphenoid sinuses. The orbits are normal. CT CERVICAL SPINE FINDINGS Alignment: There is grade 1 anterolisthesis at C3-4, C4-5 and C5-6 secondary to facet hypertrophy. There is normal facet alignment. Lateral masses of C1 and C2 and the occipital condyles are aligned. Skull base and vertebrae: No acute fracture. Soft tissues and spinal canal: No prevertebral fluid or swelling. No visible canal hematoma. Disc levels: No advanced spinal canal or neural foraminal stenosis. Upper chest: Large area of consolidation in the right upper lobe. Other: Patient is intubated with the endotracheal tube tip beyond the field of view. IMPRESSION: 1. No acute intracranial abnormality. 2. No acute fracture of the cervical spine. 3. Large area of right upper lobe consolidation, possibly secondary to aspiration. Electronically Signed   By: Deatra Robinson M.D.   On: 05/23/2018 06:34   Ct Cervical Spine Wo Contrast  Result Date: 05/23/2018 CLINICAL DATA:  Pulseless arrest, status post CPR. EXAM: CT HEAD WITHOUT CONTRAST CT CERVICAL SPINE WITHOUT CONTRAST TECHNIQUE: Multidetector CT imaging of the head and cervical spine was performed following the standard protocol without intravenous contrast. Multiplanar CT image reconstructions of the cervical spine were also generated. COMPARISON:  None. FINDINGS: CT HEAD FINDINGS Brain: There is no mass, hemorrhage or extra-axial collection. The size and configuration of the ventricles and extra-axial CSF spaces are normal. There is no acute or chronic infarction. The brain parenchyma is normal. Vascular: No abnormal hyperdensity of the major intracranial arteries or dural venous sinuses. No intracranial atherosclerosis. Skull:  The visualized skull base, calvarium and extracranial soft tissues are normal. Sinuses/Orbits: Fluid layering in the maxillary and sphenoid sinuses. The orbits are normal. CT CERVICAL SPINE FINDINGS Alignment: There is grade 1 anterolisthesis at C3-4, C4-5 and C5-6 secondary to facet hypertrophy. There is normal facet alignment. Lateral masses of C1 and C2 and the occipital condyles are aligned. Skull base and vertebrae: No acute fracture. Soft tissues and spinal canal: No prevertebral fluid or swelling. No visible canal hematoma. Disc levels: No advanced spinal canal or neural foraminal stenosis. Upper chest: Large area of consolidation in the right upper lobe. Other: Patient is intubated with the endotracheal tube tip beyond the field of view. IMPRESSION: 1. No acute  intracranial abnormality. 2. No acute fracture of the cervical spine. 3. Large area of right upper lobe consolidation, possibly secondary to aspiration. Electronically Signed   By: Deatra Robinson M.D.   On: 05/23/2018 06:34   Dg Chest Port 1 View  Result Date: 06/10/2018 CLINICAL DATA:  Fall pneumonia EXAM: PORTABLE CHEST 1 VIEW COMPARISON:  06/08/2018 FINDINGS: Right-sided PICC line is again noted and stable. The endotracheal tube is been removed in the interval. The nasogastric catheter has been exchanged for a feeding tube. Patchy infiltrates are again noted bilaterally right greater than left. No bony abnormality is noted. IMPRESSION: Stable patchy infiltrates. Electronically Signed   By: Alcide Clever M.D.   On: 06/10/2018 10:55   Dg Chest Port 1 View  Result Date: 06/08/2018 CLINICAL DATA:  Acute respiratory failure with hypoxemia EXAM: PORTABLE CHEST 1 VIEW COMPARISON:  06/07/2018 FINDINGS: Cardiac shadow is stable. Endotracheal tube, nasogastric catheter and right-sided PICC line are again seen and stable. Patchy infiltrates are again identified throughout both lungs right greater than left. No sizable effusion is seen. No bony  abnormality is noted. IMPRESSION: Stable patchy infiltrates bilaterally right greater than left. Tubes and lines as described. Electronically Signed   By: Alcide Clever M.D.   On: 06/08/2018 07:24   Dg Chest Port 1 View  Result Date: 06/07/2018 CLINICAL DATA:  Acute respiratory failure with hypoxemia EXAM: PORTABLE CHEST 1 VIEW COMPARISON:  06/06/2018 FINDINGS: Multifocal patchy opacities, right lung predominant, grossly unchanged. No pleural effusion or pneumothorax. Endotracheal tube terminates 8 cm above the carina. Right arm PICC terminates at the cavoatrial junction. Enteric tube courses into the stomach. IMPRESSION: Multifocal pneumonia, right lobe predominant, unchanged. Endotracheal tube terminates 8 cm above the carina. Additional support apparatus as above. Electronically Signed   By: Charline Bills M.D.   On: 06/07/2018 08:36   Dg Chest Port 1 View  Result Date: 06/06/2018 CLINICAL DATA:  Acute respiratory failure.  Hypoxemia. EXAM: PORTABLE CHEST 1 VIEW COMPARISON:  June 05, 2018 FINDINGS: The ETT and right PICC line are stable. The NG tube terminates below today's film. Right greater than left patchy pulmonary infiltrates are similar in the interval. No other interval changes. IMPRESSION: Multifocal pneumonia remains, similar in the interval. Stable support apparatus. Electronically Signed   By: Gerome Sam III M.D   On: 06/06/2018 08:42   Dg Chest Port 1 View  Result Date: 06/05/2018 CLINICAL DATA:  Respiratory fail, aspiration pneumonia EXAM: PORTABLE CHEST 1 VIEW COMPARISON:  Portable exam 0429 hours compared to 06/04/2018 FINDINGS: Tip of endotracheal tube projects 4.3 cm above carina. Nasogastric tube extends into stomach. RIGHT arm PICC line tip projects over SVC. Normal heart size mediastinal contours. Patchy BILATERAL airspace infiltrates consistent with multifocal pneumonia, RIGHT greater than LEFT. Infiltrates appear mildly improved versus previous exam. No pleural  effusion or pneumothorax. IMPRESSION: Multifocal pneumonia, with mildly improved aeration versus previous exam. Electronically Signed   By: Ulyses Southward M.D.   On: 06/05/2018 10:10   Dg Chest Port 1 View  Result Date: 06/04/2018 CLINICAL DATA:  67 year old with respiratory failure. EXAM: PORTABLE CHEST 1 VIEW COMPARISON:  06/03/2018 FINDINGS: Endotracheal tube is 3.3 cm above the carina. Nasogastric tube extends into the abdomen. PICC line tip in the lower SVC region. Patchy parenchymal disease and airspace disease in the right lung has slightly improved, particularly at the right lung base. Probable right pleural effusion. However, there are new or worsening patchy airspace densities along the periphery of the left lung. Heart size  is within normal limits and stable. Negative for a pneumothorax. IMPRESSION: 1. New or worsening airspace disease in left lung. Findings are concerning for left lung pneumonia. 2. Persistent patchy airspace disease in the right lung is most compatible with pneumonia with slightly improved aeration at the right lung base. 3. Probable small pleural effusions. 4. Visualized support apparatuses are appropriately positioned. Electronically Signed   By: Richarda Overlie M.D.   On: 06/04/2018 10:13   Dg Chest Port 1 View  Result Date: 06/03/2018 CLINICAL DATA:  Acute respiratory failure with hypoxemia EXAM: PORTABLE CHEST 1 VIEW COMPARISON:  06/02/2018 FINDINGS: The patient is rotated to the right on today's radiograph, reducing diagnostic sensitivity and specificity. Endotracheal tube tip 2.8 cm above the carina, satisfactorily position. A nasogastric tube extends below the inferior margin of the radiograph. Right PICC line tip: Lower SVC. Continued extensive airspace opacity in the right lung, worsened at the right lung base compared to prior. There is some mild bandlike density in the left mid lung, increased in conspicuity. Heart size within normal limits. IMPRESSION: 1. Worsened  airspace opacity at the right lung base with continued notable right upper lobe airspace opacity. 2. Mild increase in bandlike density in the left mid lung, probably atelectasis. 3. Support apparatus satisfactorily position. Electronically Signed   By: Gaylyn Rong M.D.   On: 06/03/2018 10:28   Dg Chest Port 1 View  Result Date: 06/02/2018 CLINICAL DATA:  Aspiration into the airway. EXAM: PORTABLE CHEST 1 VIEW COMPARISON:  Most recent comparison yesterday at 0507 hour FINDINGS: Patient is significantly rotated. Endotracheal tube approximately 15 mm from the carina. Enteric tube in place with tip and side-port below the diaphragm. Right internal jugular central venous catheter in the mid SVC. Right upper extremity PICC with tip in the distal SVC. Patchy opacities throughout the right lung, most confluent in the lower lobe without significant interval change allowing for differences in technique. Slight improvement in left lung base opacity. Heart size and mediastinal contours are grossly unchanged, partially obscured by rotation. No pneumothorax. IMPRESSION: 1. No significant change in patchy opacities throughout the right lung allowing for differences in technique and rotation. 2. New right upper extremity PICC with tip in the distal SVC. Borderline low positioning of endotracheal tube tip 15 mm from the carina. 3. Slight improving left basilar aeration. Electronically Signed   By: Narda Rutherford M.D.   On: 06/02/2018 02:36   Dg Chest Port 1 View  Result Date: 06/01/2018 CLINICAL DATA:  67 year old female with pneumonia. Subsequent encounter. EXAM: PORTABLE CHEST 1 VIEW COMPARISON:  05/30/2018 chest x-ray. FINDINGS: Endotracheal tube tip 3.9 cm above the carina. Right central line tip mid superior vena cava level. No pneumothorax detected. Nasogastric tube courses below the diaphragm. Tip is not included on the present exam. Minimal improvement of consolidation right lower lobe and patchy  consolidation right upper lobe which may represent infectious infiltrate in proper clinical setting. Asymmetric pulmonary vascular congestion/mild pulmonary edema. Heart size within normal limits. IMPRESSION: 1. Minimal improvement of right lower lobe consolidation and patchy consolidation right upper lobe suggestive of result of pneumonia. 2. Asymmetric mild pulmonary vascular congestion/pulmonary edema without change. Electronically Signed   By: Lacy Duverney M.D.   On: 06/01/2018 07:22   Dg Chest Port 1 View  Result Date: 05/30/2018 CLINICAL DATA:  Aspiration pneumonia. EXAM: PORTABLE CHEST 1 VIEW COMPARISON:  One-view chest x-ray 05/29/2018 FINDINGS: Heart size is normal. Endotracheal tube terminates 3 cm above the carina. NG tube is in  the stomach. Right IJ line is stable. Right lower lobe airspace disease is again seen. Mild edema is slightly increased. Minimal left basilar atelectasis is stable. IMPRESSION: 1. Persistent right lower lobe airspace disease compatible with pneumonia or aspiration. 2. Slight progression in edema and left basilar atelectasis. Electronically Signed   By: Marin Roberts M.D.   On: 05/30/2018 07:42   Dg Chest Port 1 View  Result Date: 05/29/2018 CLINICAL DATA:  Respiratory failure EXAM: PORTABLE CHEST 1 VIEW COMPARISON:  05/28/2018 FINDINGS: Cardiac shadow is stable. Right jugular central line, endotracheal tube and nasogastric catheter are again seen and stable. The left lung is predominantly clear although some left retrocardiac infiltrate is seen. Diffuse infiltrates are noted throughout the right lung stable in appearance when compared with the prior exam. Small right pleural effusion is noted as well. IMPRESSION: Stable bilateral infiltrates right greater than left with associated right effusion. Tubes and lines as described. Electronically Signed   By: Alcide Clever M.D.   On: 05/29/2018 07:07   Dg Chest Port 1 View  Result Date: 05/28/2018 CLINICAL DATA:   Pneumonia. EXAM: PORTABLE CHEST 1 VIEW COMPARISON:  05/28/2018. FINDINGS: Endotracheal tube, NG tube, right IJ line in stable position. Heart size stable. Diffuse bilateral pulmonary infiltrates, particular on the right side, and right-sided pleural effusion again noted. Similar findings noted on prior exam. IMPRESSION: 1.  Lines and tubes stable position. 2. Diffuse bilateral pulmonary infiltrates, right side greater than left. Right-sided pleural effusion. Similar findings on prior exam. Electronically Signed   By: Maisie Fus  Register   On: 05/28/2018 11:47   Dg Chest Portable 1 View  Result Date: 05/28/2018 CLINICAL DATA:  ARDS, respiratory failure and aspiration pneumonia. EXAM: PORTABLE CHEST 1 VIEW COMPARISON:  05/27/2018 FINDINGS: Endotracheal tube remains with the tip projecting approximately 4 cm above the carina. Right jugular central line shows stable positioning with the tip in the lower SVC. Gastric decompression tube extends into the stomach. Relatively stable airspace consolidation bilaterally, right greater than left with potentially mildly improved aeration at the right base. No significant pleural effusions. No pneumothorax. Stable heart size. IMPRESSION: Relatively stable bilateral airspace consolidation with potentially slightly improved aeration at the right base. Electronically Signed   By: Irish Lack M.D.   On: 05/28/2018 07:53   Dg Chest Port 1 View  Result Date: 05/27/2018 CLINICAL DATA:  Shortness of breath, ARDS EXAM: PORTABLE CHEST 1 VIEW COMPARISON:  Portable chest x-ray of 05/26/2018 FINDINGS: There is slightly better aeration of the right upper lung. There appear to be bilateral pleural effusions. Opacity at the right lung base remains most likely due to pneumonia. The tip of the endotracheal tube is approximatelymm 2.0 cm above the carina. NG tube extends into the stomach. IMPRESSION: 1. Tip of endotracheal tube 2.0 cm above the carina. 2. Slightly.  Aeration of the right  upper lung. 3. Bilateral pleural effusions and basilar atelectasis. Cannot exclude pneumonia particularly the right lung base. Electronically Signed   By: Dwyane Dee M.D.   On: 05/27/2018 10:08   Dg Chest Port 1 View  Result Date: 05/26/2018 CLINICAL DATA:  Acute respiratory failure EXAM: PORTABLE CHEST 1 VIEW COMPARISON:  Portable chest x-ray of May 25, 2018 FINDINGS: There is developed further opacification of much of the right lung. The greatest density is inferiorly. On the left there is increasing interstitial and alveolar opacity in the mid and lower lung. The heart borders are partially obscured. The heart is top-normal in size. The pulmonary vascularity is  not clearly engorged. The endotracheal tube tip is at the carina. The esophagogastric tube tip and proximal port project below the GE junction. The right internal jugular venous catheter tip projects over the midportion of the SVC. IMPRESSION: Progressive opacification of both lungs worrisome for widespread pneumonia. Low positioning of the endotracheal tube. Withdrawal by 2 cm would help assure that the tube does not migrate into the right mainstem bronchus with patient movement. Electronically Signed   By: David  [place] M.D.   On: 05/26/2018 08:40   Dg Chest Port 1 View  Result Date: 05/25/2018 CLINICAL DATA:  Acute respiratory failure EXAM: PORTABLE CHEST 1 VIEW COMPARISON:  05/23/2018 FINDINGS: Endotracheal tube, nasogastric catheter and right jugular central line are again seen. Cardiac shadow is mildly enlarged but stable. Diffuse right-sided infiltrate is noted with some increasing consolidation in the lateral right lung base. Left retrocardiac infiltrate is noted as well. No pneumothorax is identified. IMPRESSION: Bilateral infiltrates right greater than left, increasing in the right lung base. Electronically Signed   By: Alcide Clever M.D.   On: 05/25/2018 07:24   Dg Chest Port 1 View  Result Date: 05/23/2018 CLINICAL DATA:   Suspected DVT.  Endotracheal tube. EXAM: PORTABLE CHEST 1 VIEW COMPARISON:  One-view chest x-ray 05/23/2018 at 4:13 a.m. FINDINGS: Heart size is normal. Pacing wires are in place. NG tube courses off the inferior border of the film. The endotracheal tube has been pulled back, now terminating 4.5 cm above the carina. Right middle lobe and lower lobe airspace disease is again seen. Asymmetric edema is present on the right. IMPRESSION: 1. Interval adjustment of endotracheal tube, now in satisfactory position. 2. Right middle and lower lobe airspace disease. This is concerning for infection versus aspiration. Underlying mass is not excluded. Electronically Signed   By: Marin Roberts M.D.   On: 05/23/2018 07:28   Dg Chest Portable 1 View  Result Date: 05/23/2018 CLINICAL DATA:  Intubation EXAM: PORTABLE CHEST 1 VIEW COMPARISON:  None. FINDINGS: Endotracheal tube tip is within the proximal right mainstem bronchus. This should be retracted by 4.5 cm to place it at the level of the clavicular heads. There is a large area of right parahilar consolidation. The left lung is clear. No pleural effusion or pneumothorax. The nasogastric tube side port is below the diaphragm but not clearly visualized. A dedicated abdominal radiograph may be helpful. IMPRESSION: 1. Endotracheal tube tip in the proximal right mainstem bronchus. Retraction by 4 cm recommended. 2. Nasogastric tube side port below the diaphragm but not clearly visualized. Dedicated abdominal radiograph may be helpful. 3. Large area of right parahilar and medial right lung apex consolidation which may indicate infection or neoplastic process. Chest CT or Followup PA and lateral chest X-ray is recommended in 3-4 weeks following trial of antibiotic therapy to ensure resolution and exclude underlying malignancy. These results were called by telephone at the time of interpretation on 05/23/2018 at 5:18 am to Dr. Jaci Carrel , who verbally acknowledged  these results. Electronically Signed   By: Deatra Robinson M.D.   On: 05/23/2018 05:18   Dg Abd Portable 1v  Result Date: 06/08/2018 CLINICAL DATA:  Nasogastric tube placement. EXAM: PORTABLE ABDOMEN - 1 VIEW COMPARISON:  Abdominal radiograph May 23, 2018 FINDINGS: Feeding tube tip projects in second portion of duodenum. Included bowel gas pattern is nondilated and nonobstructive. No intra-abdominal mass effect. Reticulonodular densities and alveolar airspace opacities included lung bases. Soft tissue planes and included osseous structures are non suspicious. IMPRESSION: Feeding tube  tip projects in duodenum. Electronically Signed   By: Awilda Metro M.D.   On: 06/08/2018 16:09   Dg Abd Portable 1v  Result Date: 05/23/2018 CLINICAL DATA:  OG tube placement. EXAM: PORTABLE ABDOMEN - 1 VIEW COMPARISON:  One-view chest x-ray of the same day. FINDINGS: OG tube is now in place. The side port is in the mid stomach. Pacing leads are stable. Right middle and lower lobe pneumonia are stable. IMPRESSION: 1. Satisfactory positioning of OG tube in the stomach. 2. Right lower lobe pneumonia. Electronically Signed   By: Marin Roberts M.D.   On: 05/23/2018 10:44   [place] Ekg Site Rite  Result Date: 06/01/2018 If Site Rite image not attached, placement could not be confirmed due to current cardiac rhythm.    LOS: 23 days   Jeoffrey Massed, MD  [facility]  If 7PM-7AM, please contact night-coverage  Please page via [email or web address]-click on MD name and type text message  06/15/2018, 10:45 AM

## 2018-06-15 NOTE — Consult Note (Signed)
Physical Medicine and Rehabilitation Consult Reason for Consult: Decreased functional mobility Referring Physician: [facility]   HPI: Courtney Beard is a 67 y.o. right-handed female with history of depression, alcohol use.  Per chart review patient lives with spouse independent and active prior to admission.  One level home.  Presented 05/23/2018 after being found unresponsive with empty bottles of Klonopin and Zanaflex at bedside.  CPR was initiated.  Blood pressure 80/40.  Patient required intubation.  Cranial CT scan reviewed, unremarkable for acute intracranial process. Alcohol level 63, ammonia level 36, troponin negative, lactic acid 3.21, urine drug screen positive benzos, CK 508.  Echocardiogram with ejection fraction of 65% no wall motion abnormalities.  Patient was extubated 06/08/2018.  Hospital course septic shock protocol with acute hypoxic respiratory failure.  Subcutaneous heparin for DVT prophylaxis.  Nasogastric tube feeds await plan for swallow study.  Await plan for psychiatry consult follow-up for drug overdose.  A bedside sitter was provided for patient safety.  Therapy evaluations completed with recommendations of physical medicine rehab consult.  Review of Systems  Unable to perform ROS: Acuity of condition   Past medical history: Depression, alcohol abuse History reviewed. No pertinent surgical history, unable to obtain from patient. History reviewed. No pertinent family history, unable to obtain from patient. Social History:  has no tobacco, alcohol, and drug history on file., unreliable from patient Allergies: Not on File Medications Prior to Admission  Medication Sig Dispense Refill  . Bioflavonoid Products (GRAPE SEED PO) Take 400 mg by mouth daily.    . clonazePAM (KLONOPIN) 0.5 MG tablet Take 0.5 mg by mouth 2 (two) times daily as needed for anxiety.    . Collagen 500 MG CAPS Take 500 mg by mouth daily.    . cyclobenzaprine (FLEXERIL) 10 MG tablet Take 10  mg by mouth 3 (three) times daily as needed for muscle spasms.    Marland Kitchen escitalopram (LEXAPRO) 20 MG tablet Take 20 mg by mouth daily.    . Multiple Vitamins-Minerals (MULTI-DAY PLUS MINERALS PO) Take 1 tablet by mouth daily.    . Omega-3 Fatty Acids (FISH OIL MAXIMUM STRENGTH) 1200 MG CPDR Take 1,200 capsules by mouth 2 (two) times daily.      Home: Home Living Family/patient expects to be discharged to:: Private residence Living Arrangements: Spouse/significant other Available Help at Discharge: Family, Available 24 hours/day Type of Home: House Home Access: Stairs to enter [employer] of Steps: stated there was a few steps  Entrance Stairs-Rails: Right, Left Home Layout: One level Home Equipment: None  Functional History: Prior Function Level of Independence: Independent Comments: was active, going to the gym 4-5x/week Functional Status:  Mobility: Bed Mobility Overal bed mobility: Needs Assistance Bed Mobility: Supine to Sit, Sit to Supine Supine to sit: Mod assist Sit to supine: Mod assist General bed mobility comments: Mod A for trunk assist and LE assist throughout bed mobility.  Transfers Overall transfer level: Needs assistance Equipment used: 1 person hand held assist Transfers: Sit to/from Stand Sit to Stand: Mod assist General transfer comment: boosting assist to rise and steady from EOB with pt utilizing HHA on therapists elbows; pt able to maintain static standing with modA while NT changed fitted bed sheet      ADL: ADL Overall ADL's : Needs assistance/impaired Eating/Feeding: NPO Grooming: Min guard, Minimal assistance, Sitting Upper Body Bathing: Minimal assistance, Sitting Lower Body Bathing: Moderate assistance, Sit to/from stand Upper Body Dressing : Minimal assistance, Sitting Lower Body Dressing: Maximal assistance,  Sit to/from stand Lower Body Dressing Details (indicate cue type and reason): requires totalA to don socks this  session Toileting- Clothing Manipulation and Hygiene: Total assistance, +2 for physical assistance, +2 for safety/equipment, Bed level Toileting - Clothing Manipulation Details (indicate cue type and reason): totalA for peri-care after incontinent BM at bed level General ADL Comments: pt with significant weakness/fatigue; pt able to perform sit<>stand from EOB, prior to attempts for additional sit<>stand to attempt further mobility noted pt to be incontinent of BM, due to pt with increasing fatigue while sitting EOB returned to supine for completion of peri-care  Cognition: Cognition Overall Cognitive Status: Impaired/Different from baseline Orientation Level: Oriented to person, Oriented to place, Oriented to time, Disoriented to situation Cognition Arousal/Alertness: Awake/alert Behavior During Therapy: WFL for tasks assessed/performed Overall Cognitive Status: Impaired/Different from baseline Area of Impairment: Following commands, Safety/judgement, Problem solving Following Commands: Follows one step commands with increased time Problem Solving: Slow processing, Decreased initiation, Requires verbal cues, Requires tactile cues General Comments: Pt soft spoken and with some slurred speech, so difficulty understanding pt at times. Pt overall able to follow one step commands, though increased difficulty with command following when fatigued; pt incontinent of bowel during session and unaware   Blood pressure (!) 107/58, pulse 98, temperature 98.5 F (36.9 C), temperature source Oral, resp. rate (!) 27, height 5\' 2"  (1.575 m), weight 45.8 kg, SpO2 93 %. Physical Exam  Vitals reviewed. Constitutional: She appears well-developed.  67 year old frail female  HENT:  Head: Normocephalic and atraumatic.  Nasogastric tube in place  Eyes: EOM are normal. Right eye exhibits no discharge. Left eye exhibits no discharge.  Neck: Normal range of motion. Neck supple. No thyromegaly present.   Cardiovascular: Normal rate and regular rhythm.  Respiratory: Effort normal.  Limited inspiratory effort with some upper airway congestion  GI: Soft. Bowel sounds are normal. She exhibits no distension.  Musculoskeletal:  No edema or tenderness in extremities  Neurological:  Alert  Safety sitter is at bedside.   She needed multiple cues for place.   Limited awareness of deficits.   Follows simple commands. Motor: 4-4+/5 grossly throughout  Skin: Skin is warm and dry.  Psychiatric: Her affect is blunt. Her speech is delayed. She is slowed. Cognition and memory are impaired.    Results for orders placed or performed during the hospital encounter of 05/23/18 (from the past 24 hour(s))  CBC     Status: Abnormal   Collection Time: 06/15/18  3:20 AM  Result Value Ref Range   WBC 11.0 (H) 4.0 - 10.5 K/uL   RBC 3.07 (L) 3.87 - 5.11 MIL/uL   Hemoglobin 7.4 (L) 12.0 - 15.0 g/dL   HCT 60.4 (L) 54.0 - 98.1 %   MCV 80.1 80.0 - 100.0 fL   MCH 24.1 (L) 26.0 - 34.0 pg   MCHC 30.1 30.0 - 36.0 g/dL   RDW 19.1 (H) 47.8 - 29.5 %   Platelets 674 (H) 150 - 400 K/uL   nRBC 0.0 0.0 - 0.2 %  Basic metabolic panel     Status: Abnormal   Collection Time: 06/15/18  3:20 AM  Result Value Ref Range   Sodium 140 135 - 145 mmol/L   Potassium 3.7 3.5 - 5.1 mmol/L   Chloride 108 98 - 111 mmol/L   CO2 28 22 - 32 mmol/L   Glucose, Bld 93 70 - 99 mg/dL   BUN 19 8 - 23 mg/dL   Creatinine, Ser 6.21 0.44 - 1.00 mg/dL  Calcium 9.0 8.9 - 10.3 mg/dL   GFR calc non Af Amer >60 >60 mL/min   GFR calc Af Amer >60 >60 mL/min   Anion gap 4 (L) 5 - 15   No results found.  Assessment/Plan: Diagnosis: Debility secondary to drug overdose,?  Suicide attempt Labs and images (see above) independently reviewed.  Records reviewed and summated above.  1. Does the need for close, 24 hr/day medical supervision in concert with the patient's rehab needs make it unreasonable for this patient to be served in a less intensive  setting? Potentially  2. Co-Morbidities requiring supervision/potential complications: depression (ensure mood does not hinder progress of therapies), alcohol use (counsel), leukocytosis (repeat labs, cont to monitor for signs and symptoms of infection, further workup if indicated, ABLA (repeat labs, transfuse to ensure appropriate perfusion for increased activity tolerance) 3. Due to safety, disease management and patient education, does the patient require 24 hr/day rehab nursing? Yes 4. Does the patient require coordinated care of a physician, rehab nurse, PT (1-2 hrs/day, 5 days/week), OT (1-2 hrs/day, 5 days/week) and SLP (1-2 hrs/day, 5 days/week) to address physical and functional deficits in the context of the above medical diagnosis(es)? Potentially Addressing deficits in the following areas: balance, endurance, locomotion, strength, transferring, bathing, dressing, toileting, cognition and psychosocial support 5. Can the patient actively participate in an intensive therapy program of at least 3 hrs of therapy per day at least 5 days per week? Yes 6. The potential for patient to make measurable gains while on inpatient rehab is excellent 7. Anticipated functional outcomes upon discharge from inpatient rehab are supervision  with PT, supervision with OT, supervision and min assist with SLP. 8. Estimated rehab length of stay to reach the above functional goals is: 10-14 days. 9. Anticipated D/C setting: Other 10. Anticipated post D/C treatments: HH therapy and Home excercise program 11. Overall Rehab/Functional Prognosis: excellent and good  RECOMMENDATIONS: This patient's condition is appropriate for continued rehabilitative care in the following setting: Will await for more thorough therapy evaluation as well as monitor progress and await psychiatry recommendations.  Potentially CIR. Patient has agreed to participate in recommended program. Potentially Note that insurance prior authorization  may be required for reimbursement for recommended care.  Comment: Rehab Admissions Coordinator to follow up.   I have personally performed a face to face diagnostic evaluation, including, but not limited to relevant history and physical exam findings, of this patient and developed relevant assessment and plan.  Additionally, I have reviewed and concur with the physician assistant's documentation above.   Maryla Morrow, MD, ABPMR Mcarthur Rossetti Angiulli, PA-C 06/15/2018

## 2018-06-15 NOTE — Consult Note (Addendum)
[facility] Face-to-Face Psychiatry Consult   Reason for Consult:  Suicide attempt  Referring Physician:  Dr. Sloan Leiter Patient Identification: Courtney Beard MRN:  [id number] Principal Diagnosis: MDD (major depressive disorder), recurrent severe, without psychosis ([place]) Diagnosis:   Patient Active Problem List   Diagnosis Date Noted  . Depression [F32.9]   . Alcohol abuse [F10.10]   . Leukocytosis [D72.829]   . Acute blood loss anemia [D62]   . Overdose [T50.901A] 05/23/2018  . Acute hypoxemic respiratory failure ([place]) [J96.01]   . Aspiration pneumonia of right lung due to gastric secretions ([place]) [J69.0]     Total Time spent with patient: 1 hour  Subjective:   Courtney Beard is a 67 y.o. female patient admitted with suicide attempt.  HPI:   Per chart review, patient was admitted on 10/12 with cardiac arrest in the setting of overdose on Klonopin and Zanaflex. Her hospital course was complicated by aspiration pneumonia resulting in ARDS, prolonged ventilator dependence, septic shock, delirium, dysphagia requiring CorPak tube placement and physical deconditioning due to critical illness. She was extubated on 10/28. BAL was 63 and UDS was positive for benzodiazepines on admission. She is prescribed Seroquel 50 mg BID and Depakote 500 mg BID in the hospital. She received Ativan 1 mg this morning for anxiety. Home medications include Klonopin 0.5 mg daily PRN (prescribed by Dr. Carol Ada and last filled on 10/7 per PMP) and Lexapro 20 mg daily.   On interview, Courtney Beard reports that she has been dealing with a lot of stress (including managing her household) and struggles with insomnia.  She reports that she has been taking Klonopin for insomnia for 13 years.  She uses it once to twice weekly.  She denies a history of anxiety or depression although medical records indicate a history of anxiety, depression and alcohol abuse.  She reports that she does not remember trying to end her life  or any stressful events that occurred prior to hospitalization.  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  She has felt "unneeded" and "useless."  She has also struggled most of her life with binge drinking, anxiety and depression.  She denies a history of manic symptoms (decreased need for sleep, increased energy, pressured speech or euphoria).  She denies SI, HI or AVH at this time.  She reports poor appetite but denies changes in her weight.  She is only oriented to person.  She believes that she is 67 years old and that she is at [facility].  She provided verbal consent to speak to her husband although he was unable to be reached by phone.  Past Psychiatric History: Alcohol abuse, anxiety and depression per medical records.   Risk to Self:  Yes given serious suicide attempt.  Risk to Others:  None. Denies HI. Prior Inpatient Therapy:  Denies  Prior Outpatient Therapy:  She is followed by Dr. Carol Ada.   Past Medical History: History reviewed. No pertinent past medical history. History reviewed. No pertinent surgical history. Family History: History reviewed. No pertinent family history. Family Psychiatric  History: Denies  Social History:  Social History   Substance and Sexual Activity  Alcohol Use Not on file     Social History   Substance and Sexual Activity  Drug Use Not on file    Social History   Socioeconomic History  . Marital status: Married    Spouse name: Not on file  . Number of children: Not on file  .  Years of education: Not on file  . Highest education level: Not on file  Occupational History  . Not on file  Social Needs  . Financial resource strain: Not on file  . Food insecurity:    Worry: Not on file    Inability: Not on file  . Transportation needs:    Medical: Not on file    Non-medical: Not on file  Tobacco Use  . Smoking status: Not on file  Substance and Sexual Activity  . Alcohol use: Not on file   . Drug use: Not on file  . Sexual activity: Not on file  Lifestyle  . Physical activity:    Days per week: Not on file    Minutes per session: Not on file  . Stress: Not on file  Relationships  . Social connections:    Talks on phone: Not on file    Gets together: Not on file    Attends religious service: Not on file    Active member of club or organization: Not on file    Attends meetings of clubs or organizations: Not on file    Relationship status: Not on file  Other Topics Concern  . Not on file  Social History Narrative  . Not on file   Additional Social History: She lives at home with her husband of 76 years. She has 2 adult sons. She retired in January. She was a respiratory therapist for 20 years. She reports social alcohol use although medical records indicate problematic use with binge drinking. She denies illicit substance use.     Allergies:  Not on File  Labs:  Results for orders placed or performed during the hospital encounter of 05/23/18 (from the past 48 hour(s))  CBC     Status: Abnormal   Collection Time: 06/14/18  3:22 AM  Result Value Ref Range   WBC 13.3 (H) 4.0 - 10.5 K/uL   RBC 3.10 (L) 3.87 - 5.11 MIL/uL   Hemoglobin 7.3 (L) 12.0 - 15.0 g/dL   HCT 25.0 (L) 36.0 - 46.0 %   MCV 80.6 80.0 - 100.0 fL   MCH 23.5 (L) 26.0 - 34.0 pg   MCHC 29.2 (L) 30.0 - 36.0 g/dL   RDW 15.7 (H) 11.5 - 15.5 %   Platelets 724 (H) 150 - 400 K/uL   nRBC 0.0 0.0 - 0.2 %    Comment: Performed at [facility], [address], [place], [place] [address]  Basic metabolic panel     Status: Abnormal   Collection Time: 06/14/18  3:22 AM  Result Value Ref Range   Sodium 142 135 - 145 mmol/L   Potassium 3.6 3.5 - 5.1 mmol/L   Chloride 111 98 - 111 mmol/L   CO2 27 22 - 32 mmol/L   Glucose, Bld 111 (H) 70 - 99 mg/dL   BUN 24 (H) 8 - 23 mg/dL   Creatinine, Ser 0.59 0.44 - 1.00 mg/dL   Calcium 9.0 8.9 - 10.3 mg/dL   GFR calc non Af Amer >60 >60 mL/min   GFR calc Af Amer  >60 >60 mL/min    Comment: (NOTE) The eGFR has been calculated using the CKD EPI equation. This calculation has not been validated in all clinical situations. eGFR's persistently <60 mL/min signify possible Chronic Kidney Disease.    Anion gap 4 (L) 5 - 15    Comment: Performed at [facility] [address], [place], [place] [address]  Vitamin B12  Status: Abnormal   Collection Time: 06/14/18  3:22 AM  Result Value Ref Range   Vitamin B-12 1,509 (H) 180 - 914 pg/mL    Comment: (NOTE) This assay is not validated for testing neonatal or myeloproliferative syndrome specimens for Vitamin B12 levels. Performed at [facility], [place] [address] [place], [place] [address]   Folate     Status: None   Collection Time: 06/14/18  3:22 AM  Result Value Ref Range   Folate 28.0 >5.9 ng/mL    Comment: Performed at [facility], [place] [address], [place], [place] [address]  Iron and TIBC     Status: Abnormal   Collection Time: 06/14/18  3:22 AM  Result Value Ref Range   Iron 52 28 - 170 ug/dL   TIBC 192 (L) 250 - 450 ug/dL   Saturation Ratios 27 10.4 - 31.8 %   UIBC 140 ug/dL    Comment: Performed at [facility], [place] [address], [place], [place] [address]  Ferritin     Status: None   Collection Time: 06/14/18  3:22 AM  Result Value Ref Range   Ferritin 202 11 - 307 ng/mL    Comment: Performed at [facility], [place] [address], [place], [place] [address]  Reticulocytes     Status: Abnormal   Collection Time: 06/14/18  3:22 AM  Result Value Ref Range   Retic Ct Pct 1.4 0.4 - 3.1 %   RBC. 3.10 (L) 3.87 - 5.11 MIL/uL   Retic Count, Absolute 44.6 19.0 - 186.0 K/uL   Immature Retic Fract 12.2 2.3 - 15.9 %    Comment: Performed at [facility] [address], [place], [place] [address]  CBC     Status: Abnormal   Collection Time: 06/15/18  3:20 AM  Result Value Ref Range   WBC 11.0 (H) 4.0 - 10.5 K/uL   RBC 3.07 (L) 3.87 - 5.11 MIL/uL   Hemoglobin 7.4  (L) 12.0 - 15.0 g/dL   HCT 24.6 (L) 36.0 - 46.0 %   MCV 80.1 80.0 - 100.0 fL   MCH 24.1 (L) 26.0 - 34.0 pg   MCHC 30.1 30.0 - 36.0 g/dL   RDW 15.6 (H) 11.5 - 15.5 %   Platelets 674 (H) 150 - 400 K/uL   nRBC 0.0 0.0 - 0.2 %    Comment: Performed at [facility], [place] [address], [place], [place] [address]  Basic metabolic panel     Status: Abnormal   Collection Time: 06/15/18  3:20 AM  Result Value Ref Range   Sodium 140 135 - 145 mmol/L   Potassium 3.7 3.5 - 5.1 mmol/L   Chloride 108 98 - 111 mmol/L   CO2 28 22 - 32 mmol/L   Glucose, Bld 93 70 - 99 mg/dL   BUN 19 8 - 23 mg/dL   Creatinine, Ser 0.71 0.44 - 1.00 mg/dL   Calcium 9.0 8.9 - 10.3 mg/dL   GFR calc non Af Amer >60 >60 mL/min   GFR calc Af Amer >60 >60 mL/min    Comment: (NOTE) The eGFR has been calculated using the CKD EPI equation. This calculation has not been validated in all clinical situations. eGFR's persistently <60 mL/min signify possible Chronic Kidney Disease.    Anion gap 4 (L) 5 - 15    Comment: Performed at [facility] [address] [place], [place] [address]    Current Facility-Administered Medications  Medication Dose Route Frequency Provider Last Rate Last Dose  . 0.45 % sodium chloride infusion   Intravenous Continuous Jonetta Osgood, MD 10 mL/hr at 06/14/18 1334    . acetaminophen (TYLENOL) tablet 650 mg  650 mg Oral Q6H PRN Collene Gobble, MD   650 mg at 06/08/18 1207  . albuterol (PROVENTIL) (2.5 MG/3ML) 0.083% nebulizer solution 2.5 mg  2.5 mg Nebulization Q2H PRN Simonne Maffucci B, MD   2.5 mg at 06/09/18 2139  . bisacodyl (DULCOLAX) suppository 10 mg  10 mg Rectal Daily PRN Juanito Doom, MD   10 mg at 06/03/18 1001  . Chlorhexidine Gluconate Cloth 2 % PADS 6 each  6 each Topical Daily Juanito Doom, MD   6 each at 06/14/18 0957  . famotidine (PEPCID) 40 MG/5ML suspension 20 mg  20 mg Oral Daily Bland, Scott, DO   20 mg at 06/14/18 0855  . feeding supplement  (JEVITY 1.2 CAL) liquid 1,000 mL  1,000 mL Per Tube Continuous Juanito Doom, MD 55 mL/hr at 06/14/18 2234 1,000 mL at 06/14/18 2234  . folic acid (FOLVITE) tablet 1 mg  1 mg Per Tube Daily Rigoberto Noel, MD   1 mg at 06/14/18 1001  . free water 100 mL  100 mL Per Tube Q4H Jonetta Osgood, MD   100 mL at 06/15/18 0556  . guaiFENesin (ROBITUSSIN) 100 MG/5ML solution 200 mg  10 mL Per Tube Q4H PRN Agarwala, Einar Grad, MD      . heparin injection 5,000 Units  5,000 Units Subcutaneous Q8H Omar Person, NP   5,000 Units at 06/15/18 0507  . labetalol (NORMODYNE,TRANDATE) injection 20 mg  20 mg Intravenous Q10 min PRN Erick Colace, NP   20 mg at 05/26/18 1338  . lip balm (CARMEX) ointment   Topical PRN Juanito Doom, MD   1 application at 27/25/36 1656  . LORazepam (ATIVAN) injection 0.5 mg  0.5 mg Intravenous Q12H PRN Jonetta Osgood, MD      . MEDLINE mouth rinse  15 mL Mouth Rinse BID Kipp Brood, MD   15 mL at 06/14/18 2219  . polyethylene glycol (MIRALAX / GLYCOLAX) packet 17 g  17 g Oral Daily PRN Simonne Maffucci B, MD   17 g at 06/02/18 1501  . potassium chloride 20 MEQ/15ML (10%) solution 40 mEq  40 mEq Per Tube BID Simonne Maffucci B, MD   40 mEq at 06/14/18 2153  . QUEtiapine (SEROQUEL) tablet 50 mg  50 mg Per Tube BID Sherene Sires, DO   50 mg at 06/14/18 2153  . sennosides (SENOKOT) 8.8 MG/5ML syrup 5 mL  5 mL Per Tube Daily PRN Simonne Maffucci B, MD   5 mL at 06/03/18 1000  . sodium chloride flush (NS) 0.9 % injection 10-40 mL  10-40 mL Intracatheter Q12H Juanito Doom, MD   10 mL at 06/14/18 2220  . sodium chloride flush (NS) 0.9 % injection 10-40 mL  10-40 mL Intracatheter PRN Simonne Maffucci B, MD      . thiamine (VITAMIN B-1) tablet 100 mg  100 mg Per Tube Daily Rigoberto Noel, MD   100 mg at 06/14/18 0857  . valproate (DEPACON) 500 mg in dextrose 5 % 50 mL IVPB  500 mg Intravenous Q12H Juanito Doom, MD 55 mL/hr at 06/14/18 2218 500 mg at 06/14/18 2218     Musculoskeletal: Strength & Muscle Tone: Generalized weakness Gait & Station: unable to stand Patient leans:  N/A  Psychiatric Specialty Exam: Physical Exam  Nursing note and vitals reviewed. Constitutional: She is oriented to person, place, and time. She appears well-developed.  thin  HENT:  Head: Normocephalic and atraumatic.  Neck: Normal range of motion.  Respiratory: Effort normal.  Musculoskeletal: Normal range of motion.  Neurological: She is alert and oriented to person, place, and time.  Psychiatric: She has a normal mood and affect. Judgment and thought content normal. Her speech is slurred. She is slowed. Cognition and memory are impaired.    Review of Systems  Constitutional: Negative for chills and fever.  Cardiovascular: Negative for chest pain.  Gastrointestinal: Negative for abdominal pain, constipation, diarrhea, nausea and vomiting.  Psychiatric/Behavioral: Negative for hallucinations, substance abuse and suicidal ideas. The patient is nervous/anxious and has insomnia.   All other systems reviewed and are negative.   Blood pressure (!) 107/58, pulse 98, temperature 98.5 F (36.9 C), temperature source Oral, resp. rate (!) 27, height '5\' 2"'  (1.575 m), weight 45.8 kg, SpO2 93 %.Body mass index is 18.47 kg/m.  General Appearance: Fairly Groomed, thin, elderly, Caucasian female, wearing a hospital gown with short hair and blue dyed oral mucosa who is lying in bed a chair. NAD.   Eye Contact:  Fair  Speech:  Slow and Slurred  Volume:  Decreased  Mood:  Anxious  Affect:  Congruent and Constricted  Thought Process:  Goal Directed, Linear and Descriptions of Associations: Intact  Orientation:  Other:  Oriented to person only.  Thought Content:  Logical  Suicidal Thoughts:  No  Homicidal Thoughts:  No  Memory:  Immediate;   Poor Recent;   Fair Remote;   Fair  Judgement:  Poor  Insight:  Lacking  Psychomotor Activity:  Decreased  Concentration:  Concentration:  Good and Attention Span: Good  Recall:  Good  Fund of Knowledge:  Fair  Language:  Fair  Akathisia:  No  Handed:  Right  AIMS (if indicated):   N/A  Assets:  Financial Resources/Insurance Housing Intimacy Social Support  ADL's:  Impaired  Cognition:  Impaired with short term memory deficits.   Sleep:   N/A   Assessment:  Courtney Beard is a 67 y.o. female who was admitted with Klonopin and Zanaflex overdose. Her hospital course was complicated by aspiration pneumonia resulting in ARDS, prolonged ventilator dependence, septic shock, delirium, dysphagia requiring CorPak tube placement and physical deconditioning due to critical illness. She is only oriented to person today. She is able to state that she has been dealing with several stressors although she denies a suicide attempt. Her affect appears constricted. Her husband was unable to be reached for collateral. She warrants inpatient psychiatric hospitalization for stabilization and treatment.   Treatment Plan Summary: -Patient warrants inpatient psychiatric hospitalization given high risk of harm to self. -Continue bedside sitter.  -Continue Seroquel 50 mg BID for agitation/delirium.  -EKG reviewed and QTc 433 on 10/30. Please closely monitor when starting or increasing QTc prolonging agents.  -Please pursue involuntary commitment if patient refuses voluntary psychiatric hospitalization or attempts to leave the hospital.  -Will sign off on patient at this time. Please consult psychiatry again as needed.     Disposition: Recommend psychiatric Inpatient admission when medically cleared.  Faythe Dingwall, DO 06/15/2018 12:06 PM    Addendum: Patient's husband returned phone call to this notewriter. He reports that he now realizes that his wife's behaviors changed over the past week. She was showing him how to do household tasks and made statements  such as "in case I'm not around." She has a history of binge drinking and had  a bottle of wine on the day of her suicide attempt. She recently completed gene testing and reportedly found out she was at risk for dementia. She has been forgetful. Her mother has a history of dementia. Her relationship is strained with her mother. She is poorly compliant with Lexapro. He reports that she overdosed on his Zanaflex. He found her at home when he returned from the gym and performed CPR. He voiced understanding for the recommendation for inpatient psychiatric hospitalization and several of his questions and concerns were addressed. Will defer to inpatient psychiatric team for restarting antidepressant since patient is poorly compliant and still confused at this time.   Buford Dresser, DO 06/15/18 4:07 PM

## 2018-06-15 NOTE — Progress Notes (Signed)
Nutrition Follow-up  DOCUMENTATION CODES:   Underweight  INTERVENTION:   Continue tube feeding via Cortrak: - Increase Jevity 1.2 to 60 ml/hr (1440 ml/day) - Free water 100 ml q 4 hours (MD to adjust as needed)  Tube feeding regimen and current free water provides 1728 kcal, 80 grams of protein, and 1766 ml of H2O (100% of needs)  NUTRITION DIAGNOSIS:   Inadequate oral intake related to inability to eat as evidenced by NPO status.  Ongoing  GOAL:   Patient will meet greater than or equal to 90% of their needs  Met via TF  MONITOR:   Diet advancement, Labs, Weight trends, Skin, TF tolerance  ASSESSMENT:   67 y/o female PMHx anxiety, depression, etoh use. Presents after husband found pt on floor, covered in emesis w/ empty bottles of klonopin and zanaflex. Questionable cardiac arrest. Intubated on arrival to ED. Pt developed septic shock w/ CXR showing aspiration PNA.   10/28 - extubated, post-pyloric Cortrak placed  Pt had repeat FEES this afternoon. Discussed pt with RN who reports pt is tolerating TF well.  Spoke with pt and husband at bedside. Pt very lethargic and sleeping throughout visit. Pt's husband with no questions regarding tube feeding but states that he is disappointed that pt did not pass FEES today.  Weight trending down since admission (-24 lbs since first measured weight on 10/12). Current weight is 100 lbs, pt now in underweight BMI category. Will repeat NFPE at follow-up given weight loss.  Current tube feeding: Jevity 1.2 @ 55 ml/hr to provide 1584 kcal, 73 grams of protein (plan to increase rate to 60 ml/hr today), free water per MD  Medications reviewed and include: Pepcid 20 mg daily, folic acid 1 mg daily, free water 100 ml q 4 hours via Cortrak, potassium chloride 40 mEq BID, thiamine 100 mg daily  Labs reviewed: hemoglobin 7.4 (L), vitamin B-12 1509 (H)  Diet Order:   Diet Order            Diet NPO time specified  Diet effective now               EDUCATION NEEDS:   No education needs have been identified at this time  Skin:  Skin Assessment: Skin Integrity Issues: Other: MASD: sacrum  Last BM:  11/2  Height:   Ht Readings from Last 1 Encounters:  06/06/18 '5\' 2"'  (1.575 m)    Weight:   Wt Readings from Last 1 Encounters:  06/15/18 45.8 kg    Ideal Body Weight:  50 kg  BMI:  Body mass index is 18.47 kg/m.  Estimated Nutritional Needs:   Kcal:  1550-1750  Protein:  70-85 grams  Fluid:  >/= 1.5 L    Gaynell Face, MS, RD, LDN Inpatient Clinical Dietitian Pager: [phone] Weekend/After Hours: [phone]

## 2018-06-15 NOTE — Progress Notes (Signed)
  Speech Language Pathology  Patient Details Name: Courtney Beard MRN: [id number] DOB: Feb 27, 1951 Today's Date: 06/15/2018 Time: [phone]-     Pt will have FEES swallow assessment today at 1:15                              Royce Macadamia 06/15/2018, 10:06 AM  Breck Coons Lonell Face.Ed Nurse, children's [phone] Office [phone]

## 2018-06-15 NOTE — Progress Notes (Signed)
Physical Therapy Treatment Patient Details Name: Courtney Beard MRN: [id number] DOB: Apr 27, 1951 Today's Date: 06/15/2018    History of Present Illness Pt is a 67 y/o female admitted secondary to Cardiac arrest complicated by aspiration PNA and ARDS secondary to overdose. Pt was intubated on 10/12 and was extubated 10/28. PMH includes Anxiety, Depression, ETOH abuse.     PT Comments    Patient doing well today - very motivated to participate with PT services. Patient today requiring Min A for bed mobility and transfers with Min A +2 for gait with +2 HHA. Patient unsteady throughout mobility requiring physical assist for steadying as well as cueing for step length and weight shift to reduce scissoring gait pattern. Patient limited by fatigue. Will recommend CIR at discharge. PT to follow acutely.     Follow Up Recommendations  CIR     Equipment Recommendations  None recommended by PT    Recommendations for Other Services OT consult     Precautions / Restrictions Precautions Precautions: Fall;Other (comment) Precaution Comments: suicide precautions, feeding tube Restrictions Weight Bearing Restrictions: No    Mobility  Bed Mobility Overal bed mobility: Needs Assistance Bed Mobility: Supine to Sit     Supine to sit: Min assist     General bed mobility comments: Min A for hip/trunk assist; cueing for upright posture as patient prefers forward flexion and resting on elbows  Transfers Overall transfer level: Needs assistance Equipment used: 2 person hand held assist Transfers: Sit to/from Stand Sit to Stand: Min assist         General transfer comment: Min A to power up from bedside adn for immediate standing balance; able to assess BP in standing with readings of 120's/80's  Ambulation/Gait Ambulation/Gait assistance: Min assist;+2 physical assistance;+2 safety/equipment Gait Distance (Feet): 30 Feet Assistive device: 2 person hand held assist Gait  Pattern/deviations: Step-to pattern;Step-through pattern;Decreased stride length;Drifts right/left;Narrow base of support Gait velocity: decreased   General Gait Details: slow pace of gait; cueing for even step length and weight shifting; unsteadiness throughout with required UE support   Stairs             Wheelchair Mobility    Modified Rankin (Stroke Patients Only)       Balance Overall balance assessment: Needs assistance Sitting-balance support: No upper extremity supported;Feet supported Sitting balance-Leahy Scale: Poor     Standing balance support: Bilateral upper extremity supported;During functional activity Standing balance-Leahy Scale: Poor Standing balance comment: reliant on UE support/external support from therapist                            Cognition Arousal/Alertness: Awake/alert Behavior During Therapy: WFL for tasks assessed/performed Overall Cognitive Status: Impaired/Different from baseline Area of Impairment: Following commands;Safety/judgement;Problem solving                       Following Commands: Follows one step commands with increased time Safety/Judgement: Decreased awareness of safety   Problem Solving: Slow processing;Decreased initiation;Requires verbal cues;Requires tactile cues General Comments: patient soft spoken and at times difficult to understand      Exercises      General Comments General comments (skin integrity, edema, etc.): husband present and asking if he could mobilize patient - PT recommending that staff mobilize      Pertinent Vitals/Pain Pain Assessment: No/denies pain    Home Living  Prior Function            PT Goals (current goals can now be found in the care plan section) Acute Rehab PT Goals Patient Stated Goal: regain strength, get back to independence PT Goal Formulation: With patient Time For Goal Achievement: 06/26/18 Potential to Achieve  Goals: Good Progress towards PT goals: Progressing toward goals    Frequency    Min 3X/week      PT Plan Current plan remains appropriate    Co-evaluation              AM-PAC PT "6 Clicks" Daily Activity  Outcome Measure  Difficulty turning over in bed (including adjusting bedclothes, sheets and blankets)?: A Little Difficulty moving from lying on back to sitting on the side of the bed? : Unable Difficulty sitting down on and standing up from a chair with arms (e.g., wheelchair, bedside commode, etc,.)?: Unable Help needed moving to and from a bed to chair (including a wheelchair)?: A Little Help needed walking in hospital room?: A Little Help needed climbing 3-5 steps with a railing? : A Lot 6 Click Score: 13    End of Session Equipment Utilized During Treatment: Gait belt Activity Tolerance: Patient tolerated treatment well;Patient limited by fatigue Patient left: in chair;with call bell/phone within reach;with nursing/sitter in room;with family/visitor present Nurse Communication: Mobility status PT Visit Diagnosis: Other abnormalities of gait and mobility (R26.89);Muscle weakness (generalized) (M62.81);Difficulty in walking, not elsewhere classified (R26.2)     Time: 1610-9604 PT Time Calculation (min) (ACUTE ONLY): 24 min  Charges:  $Gait Training: 8-22 mins $Therapeutic Activity: 8-22 mins                     Kipp Laurence, PT, DPT Supplemental Physical Therapist 06/15/18 2:21 PM Pager: [phone] Office: [phone]

## 2018-06-15 NOTE — Procedures (Signed)
Objective Swallowing Evaluation: Type of Study: FEES-Fiberoptic Endoscopic Evaluation of Swallow   Patient Details  Name: Courtney Beard MRN: [id number] Date of Birth: Oct 04, 1950  Today's Date: 06/15/2018 Time: SLP Start Time (ACUTE ONLY): 1330 -SLP Stop Time (ACUTE ONLY): 1405  SLP Time Calculation (min) (ACUTE ONLY): 35 min   Past Medical History: History reviewed. No pertinent past medical history. Past Surgical History: History reviewed. No pertinent surgical history. HPI: 67 y/o female admitted 05/23/18 due to post cardiac arrest in setting of presumed intentional overdose with benzodiazepine and muscle relaxants.  Bystander CPR initiated, has aspiration pneumonia and ARDS. ETT 10/12-28. Has cortrak.  Since extubation she has remained confused and periodically agitated.  She has copious secretions but has difficulty expectorating them effectively and has required frequent suctioning NT suctioning.    Subjective: alert, intermittent difficulty following commands    Assessment / Plan / Recommendation  CHL IP CLINICAL IMPRESSIONS 06/15/2018  Clinical Impression Pt exhibited oropharyngeal dysphagia with silent penetration and aspiration, diffuse pharyngeal residue from prolonged intubation, exacerbated by decreased arousal and cognitive deficits. Reduced tongue base retraction present during non swallow speech tasks, incomplete vocal adduction during phonation and moderate erythema and edema of arytenoid cartilidges. Lingual manipulation was weak with reduced propulsion across consistencies and pharyngeal phase marked by swallow initiating at the pyriform sinuses. Timeliness with ice chip and honey thick appeared. Difficult to fully assess etiology without observing full trajectory of arytenoid cartilidge to epiglottic petiole and hyoid excursion however incomplete epiglottic closure is suspected as etiology for penetration into vestibule and vocal cords with ice chip. Material observed  under the vocal cords after puree and possibly result of ice chip and puree residue mixture. Pt unable to completely clear vestibule with cues for cough. Recommend she continue NPO status with ST intervention and continued time post extubation   SLP Visit Diagnosis Dysphagia, oropharyngeal phase (R13.12)  Attention and concentration deficit following --  Frontal lobe and executive function deficit following --  Impact on safety and function Severe aspiration risk      CHL IP TREATMENT RECOMMENDATION 06/15/2018  Treatment Recommendations Therapy as outlined in treatment plan below     Prognosis 06/15/2018  Prognosis for Safe Diet Advancement Good  Barriers to Reach Goals Cognitive deficits  Barriers/Prognosis Comment --    CHL IP DIET RECOMMENDATION 06/15/2018  SLP Diet Recommendations NPO  Liquid Administration via --  Medication Administration Via alternative means  Compensations --  Postural Changes --      CHL IP OTHER RECOMMENDATIONS 06/15/2018  Recommended Consults --  Oral Care Recommendations Oral care QID;Oral care before and after PO  Other Recommendations --      CHL IP FOLLOW UP RECOMMENDATIONS 06/15/2018  Follow up Recommendations Skilled Nursing facility;24 hour supervision/assistance      CHL IP FREQUENCY AND DURATION 06/15/2018  Speech Therapy Frequency (ACUTE ONLY) min 2x/week  Treatment Duration 2 weeks           CHL IP ORAL PHASE 06/15/2018  Oral Phase Impaired  Oral - Pudding Teaspoon --  Oral - Pudding Cup --  Oral - Honey Teaspoon Weak lingual manipulation;Reduced posterior propulsion  Oral - Honey Cup NT  Oral - Nectar Teaspoon --  Oral - Nectar Cup --  Oral - Nectar Straw --  Oral - Thin Teaspoon Weak lingual manipulation  Oral - Thin Cup --  Oral - Thin Straw --  Oral - Puree Reduced posterior propulsion;Weak lingual manipulation  Oral - Mech Soft --  Oral - Regular --  Oral - Multi-Consistency --  Oral - Pill --  Oral Phase - Comment --     CHL IP PHARYNGEAL PHASE 06/15/2018  Pharyngeal Phase Impaired  Pharyngeal- Pudding Teaspoon --  Pharyngeal --  Pharyngeal- Pudding Cup --  Pharyngeal --  Pharyngeal- Honey Teaspoon Penetration/Aspiration during swallow;Pharyngeal residue - valleculae;Pharyngeal residue - pyriform  Pharyngeal Material enters airway, remains ABOVE vocal cords and not ejected out  Pharyngeal- Honey Cup --  Pharyngeal --  Pharyngeal- Nectar Teaspoon --  Pharyngeal --  Pharyngeal- Nectar Cup --  Pharyngeal --  Pharyngeal- Nectar Straw --  Pharyngeal --  Pharyngeal- Thin Teaspoon Penetration/Aspiration during swallow;Pharyngeal residue - valleculae;Pharyngeal residue - pyriform  Pharyngeal Material enters airway, CONTACTS cords and not ejected out  Pharyngeal- Thin Cup --  Pharyngeal --  Pharyngeal- Thin Straw --  Pharyngeal --  Pharyngeal- Puree Pharyngeal residue - valleculae;Pharyngeal residue - pyriform;Penetration/Aspiration during swallow  Pharyngeal Material enters airway, remains ABOVE vocal cords and not ejected out;Material enters airway, passes BELOW cords without attempt by patient to eject out (silent aspiration)  Pharyngeal- Mechanical Soft --  Pharyngeal --  Pharyngeal- Regular --  Pharyngeal --  Pharyngeal- Multi-consistency --  Pharyngeal --  Pharyngeal- Pill --  Pharyngeal --  Pharyngeal Comment --      Darrow Bussing.Ed Nurse, children's [phone] Office [phone]   No flowsheet data found.   Royce Macadamia 06/15/2018, 4:07 PM

## 2018-06-16 ENCOUNTER — Encounter (HOSPITAL_COMMUNITY): Payer: Self-pay

## 2018-06-16 LAB — BASIC METABOLIC PANEL
Anion gap: 8 (ref 5–15)
BUN: 19 mg/dL (ref 8–23)
CALCIUM: 9.3 mg/dL (ref 8.9–10.3)
CO2: 27 mmol/L (ref 22–32)
Chloride: 105 mmol/L (ref 98–111)
Creatinine, Ser: 0.71 mg/dL (ref 0.44–1.00)
GFR calc Af Amer: >60 mL/min (ref >60)
GFR calc non Af Amer: >60 mL/min (ref >60)
GLUCOSE: 110 mg/dL — AB (ref 70–99)
Potassium: 3.8 mmol/L (ref 3.5–5.1)
Sodium: 140 mmol/L (ref 135–145)

## 2018-06-16 LAB — CBC
HCT: 25.3 % — ABNORMAL LOW (ref 36.0–46.0)
Hemoglobin: 7.7 g/dL — ABNORMAL LOW (ref 12.0–15.0)
MCH: 24 pg — AB (ref 26.0–34.0)
MCHC: 30.4 g/dL (ref 30.0–36.0)
MCV: 78.8 fL — AB (ref 80.0–100.0)
PLATELETS: 641 10*3/uL — AB (ref 150–400)
RBC: 3.21 MIL/uL — ABNORMAL LOW (ref 3.87–5.11)
RDW: 15.8 % — AB (ref 11.5–15.5)
WBC: 10 10*3/uL (ref 4.0–10.5)
nRBC: 0 % (ref 0.0–0.2)

## 2018-06-16 MED ORDER — CLONAZEPAM 0.5 MG PO TABS
0.5000 mg | ORAL_TABLET | Freq: Two times a day (BID) | ORAL | Status: DC | PRN
  Administered 2018-06-16: 0.5 mg via ORAL
  Filled 2018-06-16: qty 1

## 2018-06-16 MED ORDER — HALOPERIDOL LACTATE 5 MG/ML IJ SOLN
2.5000 mg | Freq: Once | INTRAMUSCULAR | Status: AC
  Administered 2018-06-17: 2.5 mg via INTRAVENOUS
  Filled 2018-06-16: qty 1

## 2018-06-16 NOTE — Progress Notes (Signed)
Inpatient Rehabilitation Admissions Coordinator  I met with patient with her safety sitter at bedside. Pt talkative and alert. Discusses her overdose. She gave me permission to contact her spouse. I contacted her spouse by phone and discussed goals and expectation of an inpt rehab admit. He is hopeful for an inpt rehab admit and if needed a psychiatric admit. I explained that psychiatrist will be asked to see her again before final decision of a possible inpt psych admit or not. He spoke to the psychiatrist by phone yesterday. He currently works, but will take [employer] and arrange 24/7 supervision of his wife as recommended initially at d.c. I will begin insurance approval with Bernadene Person for a possible inpt rehab admit.   Danne Baxter, RN, MSN Rehab Admissions Coordinator [phone] 06/16/2018 12:13 PM

## 2018-06-16 NOTE — Progress Notes (Addendum)
PROGRESS NOTE        PATIENT DETAILS Name: Courtney Beard Age: 67 y.o. Sex: female Date of Birth: 1951/02/04 Admit Date: 05/23/2018 Admitting Physician Reyes Ivan, MD BMW:[id number], Sonny Masters, MD  Brief Narrative: Patient is a 67 y.o. female with history of EtOH use, anxiety/depression presented with a cardiac arrest in the setting of overdose and aspiration pneumonia resulting in ARDS and prolonged ventilator dependence.  Per H&P, patient's husband found the patient on day of admission unresponsive-covered in emesis with no palpable pulse- with empty bottles of Klonopin and Zanaflex.  Patient was managed in the ICU, was on broad-spectrum antimicrobial therapy-finally extubated on 10/28.  Further hospital course has been complicated by delirium, dysphagia requiring Cortak tube placement.  Mental status slowly improving with supportive care-remains n.p.o with NG feedings in place--see below for further details.  Subjective: Awake and alert-once a repeat speech therapy evaluation as she usually wants to get the NG tube out.  Assessment/Plan: Septic shock with acute hypoxic respiratory failure with ARDS secondary to aspiration pneumonia/HCAP: Sepsis pathophysiology has improved-required prolonged ventilator dependence-extubated on 10/28.  Has already completed a course of antimicrobial therapy.  Although Candida seen in sputum culture, this is thought to be a colonization/contamination rather than a true infection.  Continue supportive care-incentive spirometry/flutter valve.  Continue to titrate off oxygen as tolerated.   Acute toxic/metabolic encephalopathy: Multifactorial-secondary to hypoxia/sedatives/narcotics-she is improving rapidly.  Continue Depakote/Seroquel.  Minimize benzodiazepines as much as possible.  She no longer is on IV Ativan-we have switched her back to usual home regimen of Klonopin.    Sinus tachycardia: Secondary to acute illness/numerous  physiological stressors and anemia.  Much improved-continue telemetry monitoring.   Hypernatremia: Resolved.  Secondary to free water deficit-resolved with free water flushes via NG tube.  Follow electrolytes periodically.    Hypokalemia: Repleted-recheck periodically.  Dysphagia: Related to prolonged ventilator dependent/ET tube placement-voice quality has improved-able to cough and clear airway this morning-unfortunately FEES on 11/4-still demonstrates significant aspiration risk and remains n.p.o with NG tube in place.  Will see if speech therapy can perform another evaluation as patient seems to be improving every day.  Anemia: Secondary to critical illness-no overt evidence of blood loss.  Follow periodically.  Anemia panel not consistent with iron deficiency.  Is completely asymptomatic.  Plans are to transfuse only if hemoglobin less than 7.   Thrombocytosis: Likely reactive-no indication for iron deficiency on lab data-follow CBC  Suspected intentional drug overdose: Status is rapidly improving-when asked if she remembers taking Zanaflex and Klonopin-she claims not to remember.  Mental status has improved-seen by psychiatry on 11/4-recommendations are to transfer to inpatient psych when she is medically stable.  Sitter remains in place.    Deconditioning/debility: Secondary to critical illness-appreciate PT eval-CIR following to see if she can come to CIR given significant debility/deconditioning and ongoing issues with dysphagia.   DVT Prophylaxis: Prophylactic Heparin  Code Status: Full code  Family Communication: Spouse at bedside  Disposition Plan: Remain inpatient  Antimicrobial agents: Anti-infectives (From admission, onward)   Start     Dose/Rate Route Frequency Ordered Stop   06/08/18 0100  vancomycin (VANCOCIN) IVPB 750 mg/150 ml premix     750 mg 150 mL/hr over 60 Minutes Intravenous Every 12 hours 06/07/18 1307 06/11/18 1341   06/06/18 0100  vancomycin (VANCOCIN)  500 mg in sodium chloride 0.9 %  100 mL IVPB  Status:  Discontinued     500 mg 100 mL/hr over 60 Minutes Intravenous Every 12 hours 06/05/18 1119 06/07/18 1307   06/05/18 1200  meropenem (MERREM) 1 g in sodium chloride 0.9 % 100 mL IVPB     1 g 200 mL/hr over 30 Minutes Intravenous Every 8 hours 06/05/18 1119 06/11/18 2229   06/05/18 1130  vancomycin (VANCOCIN) IVPB 1000 mg/200 mL premix     1,000 mg 200 mL/hr over 60 Minutes Intravenous  Once 06/05/18 1119 06/05/18 1330   05/31/18 1000  ceFEPIme (MAXIPIME) 2 g in sodium chloride 0.9 % 100 mL IVPB  Status:  Discontinued     2 g 200 mL/hr over 30 Minutes Intravenous Every 12 hours 05/31/18 0903 06/05/18 1109   05/31/18 1000  vancomycin (VANCOCIN) 500 mg in sodium chloride 0.9 % 100 mL IVPB  Status:  Discontinued     500 mg 100 mL/hr over 60 Minutes Intravenous Every 12 hours 05/31/18 0903 06/02/18 1039   05/26/18 1300  cefTRIAXone (ROCEPHIN) 1 g in sodium chloride 0.9 % 100 mL IVPB  Status:  Discontinued     1 g 200 mL/hr over 30 Minutes Intravenous Every 24 hours 05/26/18 1031 05/31/18 0840   05/25/18 2200  vancomycin (VANCOCIN) IVPB 750 mg/150 ml premix  Status:  Discontinued     750 mg 150 mL/hr over 60 Minutes Intravenous Every 12 hours 05/25/18 1136 05/25/18 1348   05/24/18 2200  vancomycin (VANCOCIN) 500 mg in sodium chloride 0.9 % 100 mL IVPB  Status:  Discontinued     500 mg 100 mL/hr over 60 Minutes Intravenous Every 12 hours 05/24/18 0953 05/25/18 1136   05/24/18 1100  vancomycin (VANCOCIN) IVPB 1000 mg/200 mL premix     1,000 mg 200 mL/hr over 60 Minutes Intravenous  Once 05/24/18 0953 05/24/18 1109   05/23/18 0715  piperacillin-tazobactam (ZOSYN) IVPB 3.375 g  Status:  Discontinued     3.375 g 12.5 mL/hr over 240 Minutes Intravenous Every 8 hours 05/23/18 0706 05/26/18 0943      Procedures: ETT 10/12>>10/28 PICC 10/21  CONSULTS:  pulmonary/intensive care  Time spent: 25  minutes-Greater than 50% of this time was  spent in counseling, explanation of diagnosis, planning of further management, and coordination of care.  MEDICATIONS: Scheduled Meds: . Chlorhexidine Gluconate Cloth  6 each Topical Daily  . famotidine  20 mg Oral Daily  . folic acid  1 mg Per Tube Daily  . free water  100 mL Per Tube Q4H  . heparin  5,000 Units Subcutaneous Q8H  . mouth rinse  15 mL Mouth Rinse BID  . potassium chloride  40 mEq Per Tube BID  . QUEtiapine  50 mg Per Tube BID  . sodium chloride flush  10-40 mL Intracatheter Q12H  . thiamine  100 mg Per Tube Daily   Continuous Infusions: . sodium chloride 10 mL/hr at 06/16/18 1224  . feeding supplement (JEVITY 1.2 CAL) 1,000 mL (06/16/18 1809)  . valproate sodium Stopped (06/16/18 2345)   PRN Meds:.acetaminophen, albuterol, bisacodyl, clonazePAM, guaiFENesin, labetalol, lip balm, polyethylene glycol, sennosides, sodium chloride flush   PHYSICAL EXAM: Vital signs: Vitals:   06/16/18 0700 06/16/18 0753 06/16/18 0800 06/17/18 0551  BP:  121/70  99/62  Pulse:    (!) 108  Resp: 17 (!) 27 (!) 23 20  Temp:  98.1 F (36.7 C)  98.3 F (36.8 C)  TempSrc:  Oral  Oral  SpO2:    97%  Weight:  Height:       Filed Weights   06/11/18 0500 06/13/18 0500 06/15/18 0456  Weight: 46.9 kg 46.7 kg 45.8 kg   Body mass index is 18.47 kg/m.   General appearance:Awake, alert, not in any distress.  NG tube in place. Eyes:no scleral icterus. HEENT: Atraumatic and Normocephalic Neck: supple, no JVD. Resp:Good air entry bilaterally,no added sounds. CVS: S1 S2 regular, no murmurs.  GI: Bowel sounds present, Non tender and not distended with no gaurding, rigidity or rebound. Extremities: B/L Lower Ext shows no edema, both legs are warm to touch Neurology:  Non focal Musculoskeletal:No digital cyanosis Skin:No Rash, warm and dry Wounds:N/A  I have personally reviewed following labs and imaging studies  LABORATORY DATA: CBC: Recent Labs  Lab 06/12/18 0355  06/13/18 0459 06/14/18 0322 06/15/18 0320 06/16/18 0354  WBC 16.0* 16.2* 13.3* 11.0* 10.0  HGB 7.6* 7.7* 7.3* 7.4* 7.7*  HCT 25.6* 26.1* 25.0* 24.6* 25.3*  MCV 80.8 80.8 80.6 80.1 78.8*  PLT 893* 861* 724* 674* 641*    Basic Metabolic Panel: Recent Labs  Lab 06/12/18 0355 06/13/18 0459 06/14/18 0322 06/15/18 0320 06/16/18 0354  NA 152* 148* 142 140 140  K 3.2* 3.5 3.6 3.7 3.8  CL 116* 116* 111 108 105  CO2 30 28 27 28 27   GLUCOSE 125* 112* 111* 93 110*  BUN 33* 30* 24* 19 19  CREATININE 0.73 0.65 0.59 0.71 0.71  CALCIUM 9.5 9.5 9.0 9.0 9.3    GFR: Estimated Creatinine Clearance: 50.7 mL/min (by C-G formula based on SCr of 0.71 mg/dL).  Liver Function Tests: No results for input(s): AST, ALT, ALKPHOS, BILITOT, PROT, ALBUMIN in the last 168 hours. No results for input(s): LIPASE, AMYLASE in the last 168 hours. No results for input(s): AMMONIA in the last 168 hours.  Coagulation Profile: No results for input(s): INR, PROTIME in the last 168 hours.  Cardiac Enzymes: No results for input(s): CKTOTAL, CKMB, CKMBINDEX, TROPONINI in the last 168 hours.  BNP (last 3 results) No results for input(s): PROBNP in the last 8760 hours.  HbA1C: No results for input(s): HGBA1C in the last 72 hours.  CBG: No results for input(s): GLUCAP in the last 168 hours.  Lipid Profile: No results for input(s): CHOL, HDL, LDLCALC, TRIG, CHOLHDL, LDLDIRECT in the last 72 hours.  Thyroid Function Tests: No results for input(s): TSH, T4TOTAL, FREET4, T3FREE, THYROIDAB in the last 72 hours.  Anemia Panel: No results for input(s): VITAMINB12, FOLATE, FERRITIN, TIBC, IRON, RETICCTPCT in the last 72 hours.  Urine analysis:    Component Value Date/Time   COLORURINE YELLOW 05/23/2018 0719   APPEARANCEUR CLEAR 05/23/2018 0719   LABSPEC 1.016 05/23/2018 0719   PHURINE 5.0 05/23/2018 0719   GLUCOSEU NEGATIVE 05/23/2018 0719   HGBUR SMALL (A) 05/23/2018 0719   BILIRUBINUR NEGATIVE  05/23/2018 0719   KETONESUR NEGATIVE 05/23/2018 0719   PROTEINUR NEGATIVE 05/23/2018 0719   NITRITE NEGATIVE 05/23/2018 0719   LEUKOCYTESUR NEGATIVE 05/23/2018 0719    Sepsis Labs: Lactic Acid, Venous    Component Value Date/Time   LATICACIDVEN 1.4 05/23/2018 1052    MICROBIOLOGY: No results found for this or any previous visit (from the past 240 hour(s)).  RADIOLOGY STUDIES/RESULTS: Dg Chest 1 View  Result Date: 05/23/2018 CLINICAL DATA:  Aspiration pneumonia. Acute hypoxemic respiratory failure. Central line placement. EXAM: CHEST  1 VIEW COMPARISON:  Prior today FINDINGS: A new right jugular central venous catheter is seen with tip overlying the distal SVC. Endotracheal tube and nasogastric  tube remain in appropriate position. No pneumothorax visualized. Worsening airspace disease is seen throughout the right lung. Mild airspace disease in the medial left lower lung also shows mild worsening. IMPRESSION: New right jugular central venous catheter in appropriate position. No pneumothorax visualized. Interval worsening of airspace disease throughout the right lung and in the medial left lower lobe. Electronically Signed   By: Myles Rosenthal M.D.   On: 05/23/2018 23:07   Ct Head Wo Contrast  Result Date: 05/23/2018 CLINICAL DATA:  Pulseless arrest, status post CPR. EXAM: CT HEAD WITHOUT CONTRAST CT CERVICAL SPINE WITHOUT CONTRAST TECHNIQUE: Multidetector CT imaging of the head and cervical spine was performed following the standard protocol without intravenous contrast. Multiplanar CT image reconstructions of the cervical spine were also generated. COMPARISON:  None. FINDINGS: CT HEAD FINDINGS Brain: There is no mass, hemorrhage or extra-axial collection. The size and configuration of the ventricles and extra-axial CSF spaces are normal. There is no acute or chronic infarction. The brain parenchyma is normal. Vascular: No abnormal hyperdensity of the major intracranial arteries or dural venous  sinuses. No intracranial atherosclerosis. Skull: The visualized skull base, calvarium and extracranial soft tissues are normal. Sinuses/Orbits: Fluid layering in the maxillary and sphenoid sinuses. The orbits are normal. CT CERVICAL SPINE FINDINGS Alignment: There is grade 1 anterolisthesis at C3-4, C4-5 and C5-6 secondary to facet hypertrophy. There is normal facet alignment. Lateral masses of C1 and C2 and the occipital condyles are aligned. Skull base and vertebrae: No acute fracture. Soft tissues and spinal canal: No prevertebral fluid or swelling. No visible canal hematoma. Disc levels: No advanced spinal canal or neural foraminal stenosis. Upper chest: Large area of consolidation in the right upper lobe. Other: Patient is intubated with the endotracheal tube tip beyond the field of view. IMPRESSION: 1. No acute intracranial abnormality. 2. No acute fracture of the cervical spine. 3. Large area of right upper lobe consolidation, possibly secondary to aspiration. Electronically Signed   By: Deatra Robinson M.D.   On: 05/23/2018 06:34   Ct Cervical Spine Wo Contrast  Result Date: 05/23/2018 CLINICAL DATA:  Pulseless arrest, status post CPR. EXAM: CT HEAD WITHOUT CONTRAST CT CERVICAL SPINE WITHOUT CONTRAST TECHNIQUE: Multidetector CT imaging of the head and cervical spine was performed following the standard protocol without intravenous contrast. Multiplanar CT image reconstructions of the cervical spine were also generated. COMPARISON:  None. FINDINGS: CT HEAD FINDINGS Brain: There is no mass, hemorrhage or extra-axial collection. The size and configuration of the ventricles and extra-axial CSF spaces are normal. There is no acute or chronic infarction. The brain parenchyma is normal. Vascular: No abnormal hyperdensity of the major intracranial arteries or dural venous sinuses. No intracranial atherosclerosis. Skull: The visualized skull base, calvarium and extracranial soft tissues are normal. Sinuses/Orbits:  Fluid layering in the maxillary and sphenoid sinuses. The orbits are normal. CT CERVICAL SPINE FINDINGS Alignment: There is grade 1 anterolisthesis at C3-4, C4-5 and C5-6 secondary to facet hypertrophy. There is normal facet alignment. Lateral masses of C1 and C2 and the occipital condyles are aligned. Skull base and vertebrae: No acute fracture. Soft tissues and spinal canal: No prevertebral fluid or swelling. No visible canal hematoma. Disc levels: No advanced spinal canal or neural foraminal stenosis. Upper chest: Large area of consolidation in the right upper lobe. Other: Patient is intubated with the endotracheal tube tip beyond the field of view. IMPRESSION: 1. No acute intracranial abnormality. 2. No acute fracture of the cervical spine. 3. Large area of right upper  lobe consolidation, possibly secondary to aspiration. Electronically Signed   By: Deatra Robinson M.D.   On: 05/23/2018 06:34   Dg Chest Port 1 View  Result Date: 06/10/2018 CLINICAL DATA:  Fall pneumonia EXAM: PORTABLE CHEST 1 VIEW COMPARISON:  06/08/2018 FINDINGS: Right-sided PICC line is again noted and stable. The endotracheal tube is been removed in the interval. The nasogastric catheter has been exchanged for a feeding tube. Patchy infiltrates are again noted bilaterally right greater than left. No bony abnormality is noted. IMPRESSION: Stable patchy infiltrates. Electronically Signed   By: Alcide Clever M.D.   On: 06/10/2018 10:55   Dg Chest Port 1 View  Result Date: 06/08/2018 CLINICAL DATA:  Acute respiratory failure with hypoxemia EXAM: PORTABLE CHEST 1 VIEW COMPARISON:  06/07/2018 FINDINGS: Cardiac shadow is stable. Endotracheal tube, nasogastric catheter and right-sided PICC line are again seen and stable. Patchy infiltrates are again identified throughout both lungs right greater than left. No sizable effusion is seen. No bony abnormality is noted. IMPRESSION: Stable patchy infiltrates bilaterally right greater than left. Tubes  and lines as described. Electronically Signed   By: Alcide Clever M.D.   On: 06/08/2018 07:24   Dg Chest Port 1 View  Result Date: 06/07/2018 CLINICAL DATA:  Acute respiratory failure with hypoxemia EXAM: PORTABLE CHEST 1 VIEW COMPARISON:  06/06/2018 FINDINGS: Multifocal patchy opacities, right lung predominant, grossly unchanged. No pleural effusion or pneumothorax. Endotracheal tube terminates 8 cm above the carina. Right arm PICC terminates at the cavoatrial junction. Enteric tube courses into the stomach. IMPRESSION: Multifocal pneumonia, right lobe predominant, unchanged. Endotracheal tube terminates 8 cm above the carina. Additional support apparatus as above. Electronically Signed   By: Charline Bills M.D.   On: 06/07/2018 08:36   Dg Chest Port 1 View  Result Date: 06/06/2018 CLINICAL DATA:  Acute respiratory failure.  Hypoxemia. EXAM: PORTABLE CHEST 1 VIEW COMPARISON:  June 05, 2018 FINDINGS: The ETT and right PICC line are stable. The NG tube terminates below today's film. Right greater than left patchy pulmonary infiltrates are similar in the interval. No other interval changes. IMPRESSION: Multifocal pneumonia remains, similar in the interval. Stable support apparatus. Electronically Signed   By: Gerome Sam III M.D   On: 06/06/2018 08:42   Dg Chest Port 1 View  Result Date: 06/05/2018 CLINICAL DATA:  Respiratory fail, aspiration pneumonia EXAM: PORTABLE CHEST 1 VIEW COMPARISON:  Portable exam 0429 hours compared to 06/04/2018 FINDINGS: Tip of endotracheal tube projects 4.3 cm above carina. Nasogastric tube extends into stomach. RIGHT arm PICC line tip projects over SVC. Normal heart size mediastinal contours. Patchy BILATERAL airspace infiltrates consistent with multifocal pneumonia, RIGHT greater than LEFT. Infiltrates appear mildly improved versus previous exam. No pleural effusion or pneumothorax. IMPRESSION: Multifocal pneumonia, with mildly improved aeration versus previous  exam. Electronically Signed   By: Ulyses Southward M.D.   On: 06/05/2018 10:10   Dg Chest Port 1 View  Result Date: 06/04/2018 CLINICAL DATA:  67 year old with respiratory failure. EXAM: PORTABLE CHEST 1 VIEW COMPARISON:  06/03/2018 FINDINGS: Endotracheal tube is 3.3 cm above the carina. Nasogastric tube extends into the abdomen. PICC line tip in the lower SVC region. Patchy parenchymal disease and airspace disease in the right lung has slightly improved, particularly at the right lung base. Probable right pleural effusion. However, there are new or worsening patchy airspace densities along the periphery of the left lung. Heart size is within normal limits and stable. Negative for a pneumothorax. IMPRESSION: 1. New or worsening airspace  disease in left lung. Findings are concerning for left lung pneumonia. 2. Persistent patchy airspace disease in the right lung is most compatible with pneumonia with slightly improved aeration at the right lung base. 3. Probable small pleural effusions. 4. Visualized support apparatuses are appropriately positioned. Electronically Signed   By: Richarda Overlie M.D.   On: 06/04/2018 10:13   Dg Chest Port 1 View  Result Date: 06/03/2018 CLINICAL DATA:  Acute respiratory failure with hypoxemia EXAM: PORTABLE CHEST 1 VIEW COMPARISON:  06/02/2018 FINDINGS: The patient is rotated to the right on today's radiograph, reducing diagnostic sensitivity and specificity. Endotracheal tube tip 2.8 cm above the carina, satisfactorily position. A nasogastric tube extends below the inferior margin of the radiograph. Right PICC line tip: Lower SVC. Continued extensive airspace opacity in the right lung, worsened at the right lung base compared to prior. There is some mild bandlike density in the left mid lung, increased in conspicuity. Heart size within normal limits. IMPRESSION: 1. Worsened airspace opacity at the right lung base with continued notable right upper lobe airspace opacity. 2. Mild  increase in bandlike density in the left mid lung, probably atelectasis. 3. Support apparatus satisfactorily position. Electronically Signed   By: Gaylyn Rong M.D.   On: 06/03/2018 10:28   Dg Chest Port 1 View  Result Date: 06/02/2018 CLINICAL DATA:  Aspiration into the airway. EXAM: PORTABLE CHEST 1 VIEW COMPARISON:  Most recent comparison yesterday at 0507 hour FINDINGS: Patient is significantly rotated. Endotracheal tube approximately 15 mm from the carina. Enteric tube in place with tip and side-port below the diaphragm. Right internal jugular central venous catheter in the mid SVC. Right upper extremity PICC with tip in the distal SVC. Patchy opacities throughout the right lung, most confluent in the lower lobe without significant interval change allowing for differences in technique. Slight improvement in left lung base opacity. Heart size and mediastinal contours are grossly unchanged, partially obscured by rotation. No pneumothorax. IMPRESSION: 1. No significant change in patchy opacities throughout the right lung allowing for differences in technique and rotation. 2. New right upper extremity PICC with tip in the distal SVC. Borderline low positioning of endotracheal tube tip 15 mm from the carina. 3. Slight improving left basilar aeration. Electronically Signed   By: Narda Rutherford M.D.   On: 06/02/2018 02:36   Dg Chest Port 1 View  Result Date: 06/01/2018 CLINICAL DATA:  67 year old female with pneumonia. Subsequent encounter. EXAM: PORTABLE CHEST 1 VIEW COMPARISON:  05/30/2018 chest x-ray. FINDINGS: Endotracheal tube tip 3.9 cm above the carina. Right central line tip mid superior vena cava level. No pneumothorax detected. Nasogastric tube courses below the diaphragm. Tip is not included on the present exam. Minimal improvement of consolidation right lower lobe and patchy consolidation right upper lobe which may represent infectious infiltrate in proper clinical setting. Asymmetric  pulmonary vascular congestion/mild pulmonary edema. Heart size within normal limits. IMPRESSION: 1. Minimal improvement of right lower lobe consolidation and patchy consolidation right upper lobe suggestive of result of pneumonia. 2. Asymmetric mild pulmonary vascular congestion/pulmonary edema without change. Electronically Signed   By: Lacy Duverney M.D.   On: 06/01/2018 07:22   Dg Chest Port 1 View  Result Date: 05/30/2018 CLINICAL DATA:  Aspiration pneumonia. EXAM: PORTABLE CHEST 1 VIEW COMPARISON:  One-view chest x-ray 05/29/2018 FINDINGS: Heart size is normal. Endotracheal tube terminates 3 cm above the carina. NG tube is in the stomach. Right IJ line is stable. Right lower lobe airspace disease is again seen. Mild  edema is slightly increased. Minimal left basilar atelectasis is stable. IMPRESSION: 1. Persistent right lower lobe airspace disease compatible with pneumonia or aspiration. 2. Slight progression in edema and left basilar atelectasis. Electronically Signed   By: Marin Roberts M.D.   On: 05/30/2018 07:42   Dg Chest Port 1 View  Result Date: 05/29/2018 CLINICAL DATA:  Respiratory failure EXAM: PORTABLE CHEST 1 VIEW COMPARISON:  05/28/2018 FINDINGS: Cardiac shadow is stable. Right jugular central line, endotracheal tube and nasogastric catheter are again seen and stable. The left lung is predominantly clear although some left retrocardiac infiltrate is seen. Diffuse infiltrates are noted throughout the right lung stable in appearance when compared with the prior exam. Small right pleural effusion is noted as well. IMPRESSION: Stable bilateral infiltrates right greater than left with associated right effusion. Tubes and lines as described. Electronically Signed   By: Alcide Clever M.D.   On: 05/29/2018 07:07   Dg Chest Port 1 View  Result Date: 05/28/2018 CLINICAL DATA:  Pneumonia. EXAM: PORTABLE CHEST 1 VIEW COMPARISON:  05/28/2018. FINDINGS: Endotracheal tube, NG tube, right IJ  line in stable position. Heart size stable. Diffuse bilateral pulmonary infiltrates, particular on the right side, and right-sided pleural effusion again noted. Similar findings noted on prior exam. IMPRESSION: 1.  Lines and tubes stable position. 2. Diffuse bilateral pulmonary infiltrates, right side greater than left. Right-sided pleural effusion. Similar findings on prior exam. Electronically Signed   By: Maisie Fus  Register   On: 05/28/2018 11:47   Dg Chest Portable 1 View  Result Date: 05/28/2018 CLINICAL DATA:  ARDS, respiratory failure and aspiration pneumonia. EXAM: PORTABLE CHEST 1 VIEW COMPARISON:  05/27/2018 FINDINGS: Endotracheal tube remains with the tip projecting approximately 4 cm above the carina. Right jugular central line shows stable positioning with the tip in the lower SVC. Gastric decompression tube extends into the stomach. Relatively stable airspace consolidation bilaterally, right greater than left with potentially mildly improved aeration at the right base. No significant pleural effusions. No pneumothorax. Stable heart size. IMPRESSION: Relatively stable bilateral airspace consolidation with potentially slightly improved aeration at the right base. Electronically Signed   By: Irish Lack M.D.   On: 05/28/2018 07:53   Dg Chest Port 1 View  Result Date: 05/27/2018 CLINICAL DATA:  Shortness of breath, ARDS EXAM: PORTABLE CHEST 1 VIEW COMPARISON:  Portable chest x-ray of 05/26/2018 FINDINGS: There is slightly better aeration of the right upper lung. There appear to be bilateral pleural effusions. Opacity at the right lung base remains most likely due to pneumonia. The tip of the endotracheal tube is approximatelymm 2.0 cm above the carina. NG tube extends into the stomach. IMPRESSION: 1. Tip of endotracheal tube 2.0 cm above the carina. 2. Slightly.  Aeration of the right upper lung. 3. Bilateral pleural effusions and basilar atelectasis. Cannot exclude pneumonia particularly the  right lung base. Electronically Signed   By: Dwyane Dee M.D.   On: 05/27/2018 10:08   Dg Chest Port 1 View  Result Date: 05/26/2018 CLINICAL DATA:  Acute respiratory failure EXAM: PORTABLE CHEST 1 VIEW COMPARISON:  Portable chest x-ray of May 25, 2018 FINDINGS: There is developed further opacification of much of the right lung. The greatest density is inferiorly. On the left there is increasing interstitial and alveolar opacity in the mid and lower lung. The heart borders are partially obscured. The heart is top-normal in size. The pulmonary vascularity is not clearly engorged. The endotracheal tube tip is at the carina. The esophagogastric tube tip and  proximal port project below the GE junction. The right internal jugular venous catheter tip projects over the midportion of the SVC. IMPRESSION: Progressive opacification of both lungs worrisome for widespread pneumonia. Low positioning of the endotracheal tube. Withdrawal by 2 cm would help assure that the tube does not migrate into the right mainstem bronchus with patient movement. Electronically Signed   By: David  [place] M.D.   On: 05/26/2018 08:40   Dg Chest Port 1 View  Result Date: 05/25/2018 CLINICAL DATA:  Acute respiratory failure EXAM: PORTABLE CHEST 1 VIEW COMPARISON:  05/23/2018 FINDINGS: Endotracheal tube, nasogastric catheter and right jugular central line are again seen. Cardiac shadow is mildly enlarged but stable. Diffuse right-sided infiltrate is noted with some increasing consolidation in the lateral right lung base. Left retrocardiac infiltrate is noted as well. No pneumothorax is identified. IMPRESSION: Bilateral infiltrates right greater than left, increasing in the right lung base. Electronically Signed   By: Alcide Clever M.D.   On: 05/25/2018 07:24   Dg Chest Port 1 View  Result Date: 05/23/2018 CLINICAL DATA:  Suspected DVT.  Endotracheal tube. EXAM: PORTABLE CHEST 1 VIEW COMPARISON:  One-view chest x-ray 05/23/2018 at  4:13 a.m. FINDINGS: Heart size is normal. Pacing wires are in place. NG tube courses off the inferior border of the film. The endotracheal tube has been pulled back, now terminating 4.5 cm above the carina. Right middle lobe and lower lobe airspace disease is again seen. Asymmetric edema is present on the right. IMPRESSION: 1. Interval adjustment of endotracheal tube, now in satisfactory position. 2. Right middle and lower lobe airspace disease. This is concerning for infection versus aspiration. Underlying mass is not excluded. Electronically Signed   By: Marin Roberts M.D.   On: 05/23/2018 07:28   Dg Chest Portable 1 View  Result Date: 05/23/2018 CLINICAL DATA:  Intubation EXAM: PORTABLE CHEST 1 VIEW COMPARISON:  None. FINDINGS: Endotracheal tube tip is within the proximal right mainstem bronchus. This should be retracted by 4.5 cm to place it at the level of the clavicular heads. There is a large area of right parahilar consolidation. The left lung is clear. No pleural effusion or pneumothorax. The nasogastric tube side port is below the diaphragm but not clearly visualized. A dedicated abdominal radiograph may be helpful. IMPRESSION: 1. Endotracheal tube tip in the proximal right mainstem bronchus. Retraction by 4 cm recommended. 2. Nasogastric tube side port below the diaphragm but not clearly visualized. Dedicated abdominal radiograph may be helpful. 3. Large area of right parahilar and medial right lung apex consolidation which may indicate infection or neoplastic process. Chest CT or Followup PA and lateral chest X-ray is recommended in 3-4 weeks following trial of antibiotic therapy to ensure resolution and exclude underlying malignancy. These results were called by telephone at the time of interpretation on 05/23/2018 at 5:18 am to Dr. Jaci Carrel , who verbally acknowledged these results. Electronically Signed   By: Deatra Robinson M.D.   On: 05/23/2018 05:18   Dg Abd Portable  1v  Result Date: 06/08/2018 CLINICAL DATA:  Nasogastric tube placement. EXAM: PORTABLE ABDOMEN - 1 VIEW COMPARISON:  Abdominal radiograph May 23, 2018 FINDINGS: Feeding tube tip projects in second portion of duodenum. Included bowel gas pattern is nondilated and nonobstructive. No intra-abdominal mass effect. Reticulonodular densities and alveolar airspace opacities included lung bases. Soft tissue planes and included osseous structures are non suspicious. IMPRESSION: Feeding tube tip projects in duodenum. Electronically Signed   By: Awilda Metro M.D.   On:  06/08/2018 16:09   Dg Abd Portable 1v  Result Date: 05/23/2018 CLINICAL DATA:  OG tube placement. EXAM: PORTABLE ABDOMEN - 1 VIEW COMPARISON:  One-view chest x-ray of the same day. FINDINGS: OG tube is now in place. The side port is in the mid stomach. Pacing leads are stable. Right middle and lower lobe pneumonia are stable. IMPRESSION: 1. Satisfactory positioning of OG tube in the stomach. 2. Right lower lobe pneumonia. Electronically Signed   By: Marin Roberts M.D.   On: 05/23/2018 10:44   [place] Ekg Site Rite  Result Date: 06/01/2018 If Site Rite image not attached, placement could not be confirmed due to current cardiac rhythm.    LOS: 25 days   Jeoffrey Massed, MD  [facility]  If 7PM-7AM, please contact night-coverage  Please page via [email or web address] [email or web address]-click on MD name and type text message  06/17/2018, 9:00 AM

## 2018-06-17 MED ORDER — ALTEPLASE 2 MG IJ SOLR
2.0000 mg | Freq: Once | INTRAMUSCULAR | Status: DC
  Filled 2018-06-17: qty 2

## 2018-06-17 MED ORDER — STARCH (THICKENING) PO POWD
ORAL | Status: DC | PRN
  Filled 2018-06-17: qty 227

## 2018-06-17 NOTE — Progress Notes (Signed)
Inpatient Rehabilitation Admissions Coordinator  I await insurance approval from [facility] for a possible inpt rehab admit. I will follow up tomorrow.  Ottie Glazier, RN, MSN Rehab Admissions Coordinator [phone] 06/17/2018 1:48 PM

## 2018-06-17 NOTE — H&P (Signed)
Physical Medicine and Rehabilitation Admission H&P    Chief Complaint  Patient presents with  . Post Arrest  : HPI: Courtney Beard is a 66 year old right-handed female with history of depression, alcohol use. Per chart review patient lives with spouse independent and active prior to admission. One level home. Presented 05/23/2018 after being found unresponsive with empty bottles of Klonopin and Zanaflex at bedside. CPR was initiated. Blood pressure 80/40. Patient required intubation. Cranial CT scan reviewed, unremarkable for acute intracranial process. Alcohol level 63, ammonia level 36, troponin level negative, lactic acid 3.21, urine drug screen positive benzos, CK of 508. Echocardiogram with ejection fraction of 65% no wall motion abnormalities. Patient was extubated 06/08/2018. Hospital course septic shock protocol with acute hypoxic respiratory failure. Subcutaneous heparin for DVT prophylaxis. Nasogastric tube in place for nutritional support and diet advanced to a dysphagia #3 Honey liquid with tube removed 06/19/2018. Psychiatry follow-up awaiting plan for possible inpatient psychiatric hospitalization. Therapy evaluations completed with recommendations of physical medicine rehabilitation consult. Patient was admitted for a comprehensive rehabilitation program.  Review of Systems  Constitutional: Negative for chills and fever.  HENT: Negative for hearing loss.   Eyes: Negative for blurred vision and double vision.  Respiratory: Negative for cough and shortness of breath.   Cardiovascular: Positive for leg swelling. Negative for chest pain and palpitations.  Gastrointestinal: Positive for constipation. Negative for nausea.  Genitourinary: Negative for dysuria, flank pain and hematuria.  Musculoskeletal: Positive for myalgias.  Skin: Negative for rash.  Psychiatric/Behavioral: Positive for depression.  All other systems reviewed and are negative.  History reviewed. No  pertinent past medical history. History reviewed. No pertinent surgical history. History reviewed. No pertinent family history. Social History:  has no tobacco, alcohol, and drug history on file. Allergies: Not on File Medications Prior to Admission  Medication Sig Dispense Refill  . Bioflavonoid Products (GRAPE SEED PO) Take 400 mg by mouth daily.    . clonazePAM (KLONOPIN) 0.5 MG tablet Take 0.5 mg by mouth 2 (two) times daily as needed for anxiety.    . Collagen 500 MG CAPS Take 500 mg by mouth daily.    . cyclobenzaprine (FLEXERIL) 10 MG tablet Take 10 mg by mouth 3 (three) times daily as needed for muscle spasms.    Marland Kitchen escitalopram (LEXAPRO) 20 MG tablet Take 20 mg by mouth daily.    . Multiple Vitamins-Minerals (MULTI-DAY PLUS MINERALS PO) Take 1 tablet by mouth daily.    . Omega-3 Fatty Acids (FISH OIL MAXIMUM STRENGTH) 1200 MG CPDR Take 1,200 capsules by mouth 2 (two) times daily.      Drug Regimen Review Drug regimen was reviewed and remains appropriate with no significant issues identified  Home: Home Living Family/patient expects to be discharged to:: Private residence Living Arrangements: Spouse/significant other Available Help at Discharge: Family, Available 24 hours/day Type of Home: House Home Access: Stairs to enter [employer] of Steps: stated there was a few steps  Entrance Stairs-Rails: Right, Left Home Layout: One level Home Equipment: None   Functional History: Prior Function Level of Independence: Independent Comments: was active, going to the gym 4-5x/week  Functional Status:  Mobility: Bed Mobility Overal bed mobility: Needs Assistance Bed Mobility: Supine to Sit Supine to sit: Min assist Sit to supine: Mod assist General bed mobility comments: Min A for hip/trunk assist; cueing for upright posture as patient prefers forward flexion and resting on elbows Transfers Overall transfer level: Needs assistance Equipment used: 2 person hand  held assist  Transfers: Sit to/from Stand Sit to Stand: Min assist General transfer comment: Min A to power up from bedside adn for immediate standing balance; able to assess BP in standing with readings of 120's/80's Ambulation/Gait Ambulation/Gait assistance: Min assist, +2 physical assistance, +2 safety/equipment Gait Distance (Feet): 30 Feet Assistive device: 2 person hand held assist Gait Pattern/deviations: Step-to pattern, Step-through pattern, Decreased stride length, Drifts right/left, Narrow base of support General Gait Details: slow pace of gait; cueing for even step length and weight shifting; unsteadiness throughout with required UE support Gait velocity: decreased    ADL: ADL Overall ADL's : Needs assistance/impaired Eating/Feeding: NPO Grooming: Min guard, Minimal assistance, Sitting Upper Body Bathing: Minimal assistance, Sitting Lower Body Bathing: Moderate assistance, Sit to/from stand Upper Body Dressing : Minimal assistance, Sitting Lower Body Dressing: Maximal assistance, Sit to/from stand Lower Body Dressing Details (indicate cue type and reason): requires totalA to don socks this session Toileting- Clothing Manipulation and Hygiene: Total assistance, +2 for physical assistance, +2 for safety/equipment, Bed level Toileting - Clothing Manipulation Details (indicate cue type and reason): totalA for peri-care after incontinent BM at bed level General ADL Comments: pt with significant weakness/fatigue; pt able to perform sit<>stand from EOB, prior to attempts for additional sit<>stand to attempt further mobility noted pt to be incontinent of BM, due to pt with increasing fatigue while sitting EOB returned to supine for completion of peri-care  Cognition: Cognition Overall Cognitive Status: Impaired/Different from baseline Orientation Level: Oriented to person, Oriented to place, Oriented to situation, Disoriented to time Cognition Arousal/Alertness:  Awake/alert Behavior During Therapy: WFL for tasks assessed/performed Overall Cognitive Status: Impaired/Different from baseline Area of Impairment: Following commands, Safety/judgement, Problem solving Following Commands: Follows one step commands with increased time Safety/Judgement: Decreased awareness of safety Problem Solving: Slow processing, Decreased initiation, Requires verbal cues, Requires tactile cues General Comments: patient soft spoken and at times difficult to understand  Physical Exam: Blood pressure 99/62, pulse (!) 108, temperature 98.3 F (36.8 C), temperature source Oral, resp. rate 20, height '5\' 2"'  (1.575 m), weight 45.8 kg, SpO2 97 %. Physical Exam  Vitals reviewed. Constitutional: She appears well-developed. No distress.  HENT:  Head: Normocephalic.  Eyes: Pupils are equal, round, and reactive to light. EOM are normal.  Neck: Normal range of motion. No tracheal deviation present. No thyromegaly present.  Cardiovascular: Exam reveals no friction rub.  No murmur heard. tachycardic  Respiratory: Effort normal. No respiratory distress. She has no wheezes.  Occasional wet cough  GI: Soft. She exhibits no distension. There is no tenderness.  Musculoskeletal: Normal range of motion. She exhibits no edema.  Neurological: She is alert.  Patient is alert and resting comfortably. She does provide her name and age. She cannot recall full events of her hospital stay or events leading up to her hospital stay. Oriented to person, hospital, month (with cues). STM deficits. Delays in basic processing. No language deficits. Fair insight and awareness. Moves all 4's 4/5 prox to distal. No focal sensory findings  Skin: Skin is warm and dry.  Psychiatric: She has a normal mood and affect. Her behavior is normal.    Results for orders placed or performed during the hospital encounter of 05/23/18 (from the past 48 hour(s))  CBC     Status: Abnormal   Collection Time: 06/16/18  3:54  AM  Result Value Ref Range   WBC 10.0 4.0 - 10.5 K/uL   RBC 3.21 (L) 3.87 - 5.11 MIL/uL   Hemoglobin 7.7 (L) 12.0 - 15.0  g/dL   HCT 25.3 (L) 36.0 - 46.0 %   MCV 78.8 (L) 80.0 - 100.0 fL   MCH 24.0 (L) 26.0 - 34.0 pg   MCHC 30.4 30.0 - 36.0 g/dL   RDW 15.8 (H) 11.5 - 15.5 %   Platelets 641 (H) 150 - 400 K/uL   nRBC 0.0 0.0 - 0.2 %    Comment: Performed at [facility] [address] [place], [place] [address]  Basic metabolic panel     Status: Abnormal   Collection Time: 06/16/18  3:54 AM  Result Value Ref Range   Sodium 140 135 - 145 mmol/L   Potassium 3.8 3.5 - 5.1 mmol/L   Chloride 105 98 - 111 mmol/L   CO2 27 22 - 32 mmol/L   Glucose, Bld 110 (H) 70 - 99 mg/dL   BUN 19 8 - 23 mg/dL   Creatinine, Ser 0.71 0.44 - 1.00 mg/dL   Calcium 9.3 8.9 - 10.3 mg/dL   GFR calc non Af Amer >60 >60 mL/min   GFR calc Af Amer >60 >60 mL/min    Comment: (NOTE) The eGFR has been calculated using the CKD EPI equation. This calculation has not been validated in all clinical situations. eGFR's persistently <60 mL/min signify possible Chronic Kidney Disease.    Anion gap 8 5 - 15    Comment: Performed at [facility] [address] [place], [place] [address]   No results found.     Medical Problem List and Plan: 1.  Debility secondary to acute toxic metabolic encephalopathy/septic shock/cardiac arrest with acute respiratory failure after suspected intentional drug overdose  -admit to inpatient rehab 2.  DVT Prophylaxis/Anticoagulation: Subcutaneous heparin 3. Pain Management:  Tylenol as needed 4. Mood: Lexapro 20 mg daily Klonopin 0.5 mg twice a day as needed  -team to provide ego support as necessary  -neuropsych eval 5. Neuropsych: This patient is capable of making decisions on her own behalf. 6. Skin/Wound Care:  Routine skin checks 7. Fluids/Electrolytes/Nutrition:  Routine ins and outs with follow-up chemistries 8.Dysphagia. Dysphagia #3 honey thick liquids.Monitor  hydration  -encourage PO     Post Admission Physician Evaluation: 1. Functional deficits secondary  to toxic encephalopathy. 2. Patient is admitted to receive collaborative, interdisciplinary care between the physiatrist, rehab nursing staff, and therapy team. 3. Patient's level of medical complexity and substantial therapy needs in context of that medical necessity cannot be provided at a lesser intensity of care such as a SNF. 4. Patient has experienced substantial functional loss from his/her baseline which was documented above under the "Functional History" and "Functional Status" headings.  Judging by the patient's diagnosis, physical exam, and functional history, the patient has potential for functional progress which will result in measurable gains while on inpatient rehab.  These gains will be of substantial and practical use upon discharge  in facilitating mobility and self-care at the household level. 5. Physiatrist will provide 24 hour management of medical needs as well as oversight of the therapy plan/treatment and provide guidance as appropriate regarding the interaction of the two. 6. The Preadmission Screening has been reviewed and patient status is unchanged unless otherwise stated above. 7. 24 hour rehab nursing will assist with bladder management, bowel management, safety, skin/wound care, disease management, medication administration, pain management and patient education  and help integrate therapy concepts, techniques,education, etc. 8. PT will assess and treat for/with: Lower extremity strength, range of motion, stamina, balance, functional mobility, safety, adaptive techniques and equipment,  NMR, family education.   Goals are: supervision. 9. OT will assess and treat for/with: ADL's, functional mobility, safety, upper extremity strength, adaptive techniques and equipment, NMR, family ed.   Goals are: supervision. Therapy may proceed with showering this patient. 10. SLP will  assess and treat for/with: cognition, family ed, community reentry.  Goals are: supervision. 11. Case Management and Social Worker will assess and treat for psychological issues and discharge planning. 12. Team conference will be held weekly to assess progress toward goals and to determine barriers to discharge. 13. Patient will receive at least 3 hours of therapy per day at least 5 days per week. 14. ELOS: 10-14 days       15. Prognosis:  excellent   I have personally performed a face to face diagnostic evaluation of this patient and formulated the key components of the plan.  Additionally, I have personally reviewed laboratory data, imaging studies, as well as relevant notes and concur with the physician assistant's documentation above.  Meredith Staggers, MD, FAAPMR    Lavon Paganini [place], PA-C 06/17/2018

## 2018-06-17 NOTE — Progress Notes (Signed)
PROGRESS NOTE        PATIENT DETAILS Name: Courtney Beard Age: 67 y.o. Sex: female Date of Birth: 13-Dec-1950 Admit Date: 05/23/2018 Admitting Physician Reyes Ivan, MD NFA:[id number], Sonny Masters, MD  Brief Narrative: Patient is a 67 y.o. female with history of EtOH use, anxiety/depression presented with a cardiac arrest in the setting of overdose and aspiration pneumonia resulting in ARDS and prolonged ventilator dependence.  Per H&P, patient's husband found the patient on day of admission unresponsive-covered in emesis with no palpable pulse- with empty bottles of Klonopin and Zanaflex.  Patient was managed in the ICU, was on broad-spectrum antimicrobial therapy-finally extubated on 10/28.  Further hospital course has been complicated by delirium, dysphagia requiring Cortak tube placement.  Mental status slowly improving with supportive care-remains n.p.o with NG feedings in place--see below for further details.  Subjective: Completely awake and alert.  She wants a repeat speech therapy evaluation to see if it is not safe for her to swallow so that the NG tube can be removed.  Assessment/Plan: Septic shock with acute hypoxic respiratory failure with ARDS secondary to aspiration pneumonia/HCAP: Sepsis pathophysiology has improved-required prolonged ventilator dependence-extubated on 10/28.  Has already completed a course of antimicrobial therapy.  Although Candida seen in sputum culture, this is thought to be a colonization/contamination rather than a true infection.  Continue supportive care-incentive spirometry/flutter valve.  Continue to titrate off oxygen as tolerated-lungs are clear this morning.  Acute toxic/metabolic encephalopathy: Multifactorial-secondary to hypoxia/sedatives/narcotics-she is improving rapidly-this morning she is completely awake and alert.  Continue Depakote and Seroquel.  Minimize benzos as much as possible-she no longer is on IV Ativan-we  have switched her back to her usual home regimen of Klonopin.   Sinus tachycardia: Secondary to acute illness/numerous physiologic stressors/anemia.  Much improved-remains slightly tachycardic in the low 100s-continue telemetry monitoring.    Hypernatremia: Resolved.  Secondary to free water deficit-sodium levels have normalized with free water flushes via NG tube.  Follow electrolytes periodically.   Hypokalemia: Repleted-recheck periodically.  Dysphagia: Related to prolonged ventilator dependent/ET tube placement-voice quality has improved-able to cough and clear airway this morning-unfortunately FEES on 11/4-still demonstrates significant aspiration risk and remains n.p.o with NG tube in place.  Have asked nursing staff to see if speech therapy can perform a reevaluation as this patient seems to be improving every day.   Anemia: Secondary to critical illness-no overt evidence of blood loss.  Follow periodically.  Anemia panel not consistent with iron deficiency.  Is completely asymptomatic.  Plans are to transfuse only if hemoglobin less than 7.   Thrombocytosis: Likely reactive-no indication for iron deficiency on lab data-follow CBC  Suspected intentional drug overdose: Status is rapidly improving-when asked if she remembers taking Zanaflex and Klonopin-she claims not to remember.  Mental status has improved-seen by psychiatry on 11/4-recommendations are to transfer to inpatient psych when she is medically stable.  Sitter remains in place.    Deconditioning/debility: Secondary to critical illness-appreciate PT eval-CIR following to see if she can come to CIR given significant debility/deconditioning and ongoing issues with dysphagia.   DVT Prophylaxis: Prophylactic Heparin  Code Status: Full code  Family Communication: Spouse at bedside  Disposition Plan: Remain inpatient  Antimicrobial agents: Anti-infectives (From admission, onward)   Start     Dose/Rate Route Frequency Ordered  Stop   06/08/18 0100  vancomycin (VANCOCIN) IVPB 750  mg/150 ml premix     750 mg 150 mL/hr over 60 Minutes Intravenous Every 12 hours 06/07/18 1307 06/11/18 1341   06/06/18 0100  vancomycin (VANCOCIN) 500 mg in sodium chloride 0.9 % 100 mL IVPB  Status:  Discontinued     500 mg 100 mL/hr over 60 Minutes Intravenous Every 12 hours 06/05/18 1119 06/07/18 1307   06/05/18 1200  meropenem (MERREM) 1 g in sodium chloride 0.9 % 100 mL IVPB     1 g 200 mL/hr over 30 Minutes Intravenous Every 8 hours 06/05/18 1119 06/11/18 2229   06/05/18 1130  vancomycin (VANCOCIN) IVPB 1000 mg/200 mL premix     1,000 mg 200 mL/hr over 60 Minutes Intravenous  Once 06/05/18 1119 06/05/18 1330   05/31/18 1000  ceFEPIme (MAXIPIME) 2 g in sodium chloride 0.9 % 100 mL IVPB  Status:  Discontinued     2 g 200 mL/hr over 30 Minutes Intravenous Every 12 hours 05/31/18 0903 06/05/18 1109   05/31/18 1000  vancomycin (VANCOCIN) 500 mg in sodium chloride 0.9 % 100 mL IVPB  Status:  Discontinued     500 mg 100 mL/hr over 60 Minutes Intravenous Every 12 hours 05/31/18 0903 06/02/18 1039   05/26/18 1300  cefTRIAXone (ROCEPHIN) 1 g in sodium chloride 0.9 % 100 mL IVPB  Status:  Discontinued     1 g 200 mL/hr over 30 Minutes Intravenous Every 24 hours 05/26/18 1031 05/31/18 0840   05/25/18 2200  vancomycin (VANCOCIN) IVPB 750 mg/150 ml premix  Status:  Discontinued     750 mg 150 mL/hr over 60 Minutes Intravenous Every 12 hours 05/25/18 1136 05/25/18 1348   05/24/18 2200  vancomycin (VANCOCIN) 500 mg in sodium chloride 0.9 % 100 mL IVPB  Status:  Discontinued     500 mg 100 mL/hr over 60 Minutes Intravenous Every 12 hours 05/24/18 0953 05/25/18 1136   05/24/18 1100  vancomycin (VANCOCIN) IVPB 1000 mg/200 mL premix     1,000 mg 200 mL/hr over 60 Minutes Intravenous  Once 05/24/18 0953 05/24/18 1109   05/23/18 0715  piperacillin-tazobactam (ZOSYN) IVPB 3.375 g  Status:  Discontinued     3.375 g 12.5 mL/hr over 240 Minutes  Intravenous Every 8 hours 05/23/18 0706 05/26/18 0943      Procedures: ETT 10/12>>10/28 PICC 10/21  CONSULTS:  pulmonary/intensive care  Time spent: 25  minutes-Greater than 50% of this time was spent in counseling, explanation of diagnosis, planning of further management, and coordination of care.  MEDICATIONS: Scheduled Meds: . Chlorhexidine Gluconate Cloth  6 each Topical Daily  . famotidine  20 mg Oral Daily  . folic acid  1 mg Per Tube Daily  . free water  100 mL Per Tube Q4H  . heparin  5,000 Units Subcutaneous Q8H  . mouth rinse  15 mL Mouth Rinse BID  . potassium chloride  40 mEq Per Tube BID  . QUEtiapine  50 mg Per Tube BID  . sodium chloride flush  10-40 mL Intracatheter Q12H  . thiamine  100 mg Per Tube Daily   Continuous Infusions: . sodium chloride 10 mL/hr at 06/16/18 1224  . feeding supplement (JEVITY 1.2 CAL) 1,000 mL (06/16/18 1809)  . valproate sodium Stopped (06/16/18 2345)   PRN Meds:.acetaminophen, albuterol, bisacodyl, clonazePAM, guaiFENesin, labetalol, lip balm, polyethylene glycol, sennosides, sodium chloride flush   PHYSICAL EXAM: Vital signs: Vitals:   06/16/18 0700 06/16/18 0753 06/16/18 0800 06/17/18 0551  BP:  121/70  99/62  Pulse:    Marland Kitchen)  108  Resp: 17 (!) 27 (!) 23 20  Temp:  98.1 F (36.7 C)  98.3 F (36.8 C)  TempSrc:  Oral  Oral  SpO2:    97%  Weight:      Height:       Filed Weights   06/11/18 0500 06/13/18 0500 06/15/18 0456  Weight: 46.9 kg 46.7 kg 45.8 kg   Body mass index is 18.47 kg/m.   General appearance:Awake, alert, not in any distress.  NG tube in place. Eyes:no scleral icterus. HEENT: Atraumatic and Normocephalic Neck: supple, no JVD. Resp:Good air entry bilaterally,no rales or rhonchi CVS: S1 S2 regular, no murmurs.  GI: Bowel sounds present, Non tender and not distended with no gaurding, rigidity or rebound. Extremities: B/L Lower Ext shows no edema, both legs are warm to touch Neurology:  Non  focal Psychiatric: Normal judgment and insight. Normal mood. Musculoskeletal:No digital cyanosis Skin:No Rash, warm and dry Wounds:N/A  I have personally reviewed following labs and imaging studies  LABORATORY DATA: CBC: Recent Labs  Lab 06/12/18 0355 06/13/18 0459 06/14/18 0322 06/15/18 0320 06/16/18 0354  WBC 16.0* 16.2* 13.3* 11.0* 10.0  HGB 7.6* 7.7* 7.3* 7.4* 7.7*  HCT 25.6* 26.1* 25.0* 24.6* 25.3*  MCV 80.8 80.8 80.6 80.1 78.8*  PLT 893* 861* 724* 674* 641*    Basic Metabolic Panel: Recent Labs  Lab 06/12/18 0355 06/13/18 0459 06/14/18 0322 06/15/18 0320 06/16/18 0354  NA 152* 148* 142 140 140  K 3.2* 3.5 3.6 3.7 3.8  CL 116* 116* 111 108 105  CO2 30 28 27 28 27   GLUCOSE 125* 112* 111* 93 110*  BUN 33* 30* 24* 19 19  CREATININE 0.73 0.65 0.59 0.71 0.71  CALCIUM 9.5 9.5 9.0 9.0 9.3    GFR: Estimated Creatinine Clearance: 50.7 mL/min (by C-G formula based on SCr of 0.71 mg/dL).  Liver Function Tests: No results for input(s): AST, ALT, ALKPHOS, BILITOT, PROT, ALBUMIN in the last 168 hours. No results for input(s): LIPASE, AMYLASE in the last 168 hours. No results for input(s): AMMONIA in the last 168 hours.  Coagulation Profile: No results for input(s): INR, PROTIME in the last 168 hours.  Cardiac Enzymes: No results for input(s): CKTOTAL, CKMB, CKMBINDEX, TROPONINI in the last 168 hours.  BNP (last 3 results) No results for input(s): PROBNP in the last 8760 hours.  HbA1C: No results for input(s): HGBA1C in the last 72 hours.  CBG: No results for input(s): GLUCAP in the last 168 hours.  Lipid Profile: No results for input(s): CHOL, HDL, LDLCALC, TRIG, CHOLHDL, LDLDIRECT in the last 72 hours.  Thyroid Function Tests: No results for input(s): TSH, T4TOTAL, FREET4, T3FREE, THYROIDAB in the last 72 hours.  Anemia Panel: No results for input(s): VITAMINB12, FOLATE, FERRITIN, TIBC, IRON, RETICCTPCT in the last 72 hours.  Urine analysis:     Component Value Date/Time   COLORURINE YELLOW 05/23/2018 0719   APPEARANCEUR CLEAR 05/23/2018 0719   LABSPEC 1.016 05/23/2018 0719   PHURINE 5.0 05/23/2018 0719   GLUCOSEU NEGATIVE 05/23/2018 0719   HGBUR SMALL (A) 05/23/2018 0719   BILIRUBINUR NEGATIVE 05/23/2018 0719   KETONESUR NEGATIVE 05/23/2018 0719   PROTEINUR NEGATIVE 05/23/2018 0719   NITRITE NEGATIVE 05/23/2018 0719   LEUKOCYTESUR NEGATIVE 05/23/2018 0719    Sepsis Labs: Lactic Acid, Venous    Component Value Date/Time   LATICACIDVEN 1.4 05/23/2018 1052    MICROBIOLOGY: No results found for this or any previous visit (from the past 240 hour(s)).  RADIOLOGY STUDIES/RESULTS: Dg  Chest 1 View  Result Date: 05/23/2018 CLINICAL DATA:  Aspiration pneumonia. Acute hypoxemic respiratory failure. Central line placement. EXAM: CHEST  1 VIEW COMPARISON:  Prior today FINDINGS: A new right jugular central venous catheter is seen with tip overlying the distal SVC. Endotracheal tube and nasogastric tube remain in appropriate position. No pneumothorax visualized. Worsening airspace disease is seen throughout the right lung. Mild airspace disease in the medial left lower lung also shows mild worsening. IMPRESSION: New right jugular central venous catheter in appropriate position. No pneumothorax visualized. Interval worsening of airspace disease throughout the right lung and in the medial left lower lobe. Electronically Signed   By: Myles Rosenthal M.D.   On: 05/23/2018 23:07   Ct Head Wo Contrast  Result Date: 05/23/2018 CLINICAL DATA:  Pulseless arrest, status post CPR. EXAM: CT HEAD WITHOUT CONTRAST CT CERVICAL SPINE WITHOUT CONTRAST TECHNIQUE: Multidetector CT imaging of the head and cervical spine was performed following the standard protocol without intravenous contrast. Multiplanar CT image reconstructions of the cervical spine were also generated. COMPARISON:  None. FINDINGS: CT HEAD FINDINGS Brain: There is no mass, hemorrhage or  extra-axial collection. The size and configuration of the ventricles and extra-axial CSF spaces are normal. There is no acute or chronic infarction. The brain parenchyma is normal. Vascular: No abnormal hyperdensity of the major intracranial arteries or dural venous sinuses. No intracranial atherosclerosis. Skull: The visualized skull base, calvarium and extracranial soft tissues are normal. Sinuses/Orbits: Fluid layering in the maxillary and sphenoid sinuses. The orbits are normal. CT CERVICAL SPINE FINDINGS Alignment: There is grade 1 anterolisthesis at C3-4, C4-5 and C5-6 secondary to facet hypertrophy. There is normal facet alignment. Lateral masses of C1 and C2 and the occipital condyles are aligned. Skull base and vertebrae: No acute fracture. Soft tissues and spinal canal: No prevertebral fluid or swelling. No visible canal hematoma. Disc levels: No advanced spinal canal or neural foraminal stenosis. Upper chest: Large area of consolidation in the right upper lobe. Other: Patient is intubated with the endotracheal tube tip beyond the field of view. IMPRESSION: 1. No acute intracranial abnormality. 2. No acute fracture of the cervical spine. 3. Large area of right upper lobe consolidation, possibly secondary to aspiration. Electronically Signed   By: Deatra Robinson M.D.   On: 05/23/2018 06:34   Ct Cervical Spine Wo Contrast  Result Date: 05/23/2018 CLINICAL DATA:  Pulseless arrest, status post CPR. EXAM: CT HEAD WITHOUT CONTRAST CT CERVICAL SPINE WITHOUT CONTRAST TECHNIQUE: Multidetector CT imaging of the head and cervical spine was performed following the standard protocol without intravenous contrast. Multiplanar CT image reconstructions of the cervical spine were also generated. COMPARISON:  None. FINDINGS: CT HEAD FINDINGS Brain: There is no mass, hemorrhage or extra-axial collection. The size and configuration of the ventricles and extra-axial CSF spaces are normal. There is no acute or chronic  infarction. The brain parenchyma is normal. Vascular: No abnormal hyperdensity of the major intracranial arteries or dural venous sinuses. No intracranial atherosclerosis. Skull: The visualized skull base, calvarium and extracranial soft tissues are normal. Sinuses/Orbits: Fluid layering in the maxillary and sphenoid sinuses. The orbits are normal. CT CERVICAL SPINE FINDINGS Alignment: There is grade 1 anterolisthesis at C3-4, C4-5 and C5-6 secondary to facet hypertrophy. There is normal facet alignment. Lateral masses of C1 and C2 and the occipital condyles are aligned. Skull base and vertebrae: No acute fracture. Soft tissues and spinal canal: No prevertebral fluid or swelling. No visible canal hematoma. Disc levels: No advanced spinal canal or  neural foraminal stenosis. Upper chest: Large area of consolidation in the right upper lobe. Other: Patient is intubated with the endotracheal tube tip beyond the field of view. IMPRESSION: 1. No acute intracranial abnormality. 2. No acute fracture of the cervical spine. 3. Large area of right upper lobe consolidation, possibly secondary to aspiration. Electronically Signed   By: Deatra Robinson M.D.   On: 05/23/2018 06:34   Dg Chest Port 1 View  Result Date: 06/10/2018 CLINICAL DATA:  Fall pneumonia EXAM: PORTABLE CHEST 1 VIEW COMPARISON:  06/08/2018 FINDINGS: Right-sided PICC line is again noted and stable. The endotracheal tube is been removed in the interval. The nasogastric catheter has been exchanged for a feeding tube. Patchy infiltrates are again noted bilaterally right greater than left. No bony abnormality is noted. IMPRESSION: Stable patchy infiltrates. Electronically Signed   By: Alcide Clever M.D.   On: 06/10/2018 10:55   Dg Chest Port 1 View  Result Date: 06/08/2018 CLINICAL DATA:  Acute respiratory failure with hypoxemia EXAM: PORTABLE CHEST 1 VIEW COMPARISON:  06/07/2018 FINDINGS: Cardiac shadow is stable. Endotracheal tube, nasogastric catheter and  right-sided PICC line are again seen and stable. Patchy infiltrates are again identified throughout both lungs right greater than left. No sizable effusion is seen. No bony abnormality is noted. IMPRESSION: Stable patchy infiltrates bilaterally right greater than left. Tubes and lines as described. Electronically Signed   By: Alcide Clever M.D.   On: 06/08/2018 07:24   Dg Chest Port 1 View  Result Date: 06/07/2018 CLINICAL DATA:  Acute respiratory failure with hypoxemia EXAM: PORTABLE CHEST 1 VIEW COMPARISON:  06/06/2018 FINDINGS: Multifocal patchy opacities, right lung predominant, grossly unchanged. No pleural effusion or pneumothorax. Endotracheal tube terminates 8 cm above the carina. Right arm PICC terminates at the cavoatrial junction. Enteric tube courses into the stomach. IMPRESSION: Multifocal pneumonia, right lobe predominant, unchanged. Endotracheal tube terminates 8 cm above the carina. Additional support apparatus as above. Electronically Signed   By: Charline Bills M.D.   On: 06/07/2018 08:36   Dg Chest Port 1 View  Result Date: 06/06/2018 CLINICAL DATA:  Acute respiratory failure.  Hypoxemia. EXAM: PORTABLE CHEST 1 VIEW COMPARISON:  June 05, 2018 FINDINGS: The ETT and right PICC line are stable. The NG tube terminates below today's film. Right greater than left patchy pulmonary infiltrates are similar in the interval. No other interval changes. IMPRESSION: Multifocal pneumonia remains, similar in the interval. Stable support apparatus. Electronically Signed   By: Gerome Sam III M.D   On: 06/06/2018 08:42   Dg Chest Port 1 View  Result Date: 06/05/2018 CLINICAL DATA:  Respiratory fail, aspiration pneumonia EXAM: PORTABLE CHEST 1 VIEW COMPARISON:  Portable exam 0429 hours compared to 06/04/2018 FINDINGS: Tip of endotracheal tube projects 4.3 cm above carina. Nasogastric tube extends into stomach. RIGHT arm PICC line tip projects over SVC. Normal heart size mediastinal contours.  Patchy BILATERAL airspace infiltrates consistent with multifocal pneumonia, RIGHT greater than LEFT. Infiltrates appear mildly improved versus previous exam. No pleural effusion or pneumothorax. IMPRESSION: Multifocal pneumonia, with mildly improved aeration versus previous exam. Electronically Signed   By: Ulyses Southward M.D.   On: 06/05/2018 10:10   Dg Chest Port 1 View  Result Date: 06/04/2018 CLINICAL DATA:  67 year old with respiratory failure. EXAM: PORTABLE CHEST 1 VIEW COMPARISON:  06/03/2018 FINDINGS: Endotracheal tube is 3.3 cm above the carina. Nasogastric tube extends into the abdomen. PICC line tip in the lower SVC region. Patchy parenchymal disease and airspace disease in the right  lung has slightly improved, particularly at the right lung base. Probable right pleural effusion. However, there are new or worsening patchy airspace densities along the periphery of the left lung. Heart size is within normal limits and stable. Negative for a pneumothorax. IMPRESSION: 1. New or worsening airspace disease in left lung. Findings are concerning for left lung pneumonia. 2. Persistent patchy airspace disease in the right lung is most compatible with pneumonia with slightly improved aeration at the right lung base. 3. Probable small pleural effusions. 4. Visualized support apparatuses are appropriately positioned. Electronically Signed   By: Richarda Overlie M.D.   On: 06/04/2018 10:13   Dg Chest Port 1 View  Result Date: 06/03/2018 CLINICAL DATA:  Acute respiratory failure with hypoxemia EXAM: PORTABLE CHEST 1 VIEW COMPARISON:  06/02/2018 FINDINGS: The patient is rotated to the right on today's radiograph, reducing diagnostic sensitivity and specificity. Endotracheal tube tip 2.8 cm above the carina, satisfactorily position. A nasogastric tube extends below the inferior margin of the radiograph. Right PICC line tip: Lower SVC. Continued extensive airspace opacity in the right lung, worsened at the right lung  base compared to prior. There is some mild bandlike density in the left mid lung, increased in conspicuity. Heart size within normal limits. IMPRESSION: 1. Worsened airspace opacity at the right lung base with continued notable right upper lobe airspace opacity. 2. Mild increase in bandlike density in the left mid lung, probably atelectasis. 3. Support apparatus satisfactorily position. Electronically Signed   By: Gaylyn Rong M.D.   On: 06/03/2018 10:28   Dg Chest Port 1 View  Result Date: 06/02/2018 CLINICAL DATA:  Aspiration into the airway. EXAM: PORTABLE CHEST 1 VIEW COMPARISON:  Most recent comparison yesterday at 0507 hour FINDINGS: Patient is significantly rotated. Endotracheal tube approximately 15 mm from the carina. Enteric tube in place with tip and side-port below the diaphragm. Right internal jugular central venous catheter in the mid SVC. Right upper extremity PICC with tip in the distal SVC. Patchy opacities throughout the right lung, most confluent in the lower lobe without significant interval change allowing for differences in technique. Slight improvement in left lung base opacity. Heart size and mediastinal contours are grossly unchanged, partially obscured by rotation. No pneumothorax. IMPRESSION: 1. No significant change in patchy opacities throughout the right lung allowing for differences in technique and rotation. 2. New right upper extremity PICC with tip in the distal SVC. Borderline low positioning of endotracheal tube tip 15 mm from the carina. 3. Slight improving left basilar aeration. Electronically Signed   By: Narda Rutherford M.D.   On: 06/02/2018 02:36   Dg Chest Port 1 View  Result Date: 06/01/2018 CLINICAL DATA:  67 year old female with pneumonia. Subsequent encounter. EXAM: PORTABLE CHEST 1 VIEW COMPARISON:  05/30/2018 chest x-ray. FINDINGS: Endotracheal tube tip 3.9 cm above the carina. Right central line tip mid superior vena cava level. No pneumothorax  detected. Nasogastric tube courses below the diaphragm. Tip is not included on the present exam. Minimal improvement of consolidation right lower lobe and patchy consolidation right upper lobe which may represent infectious infiltrate in proper clinical setting. Asymmetric pulmonary vascular congestion/mild pulmonary edema. Heart size within normal limits. IMPRESSION: 1. Minimal improvement of right lower lobe consolidation and patchy consolidation right upper lobe suggestive of result of pneumonia. 2. Asymmetric mild pulmonary vascular congestion/pulmonary edema without change. Electronically Signed   By: Lacy Duverney M.D.   On: 06/01/2018 07:22   Dg Chest Port 1 View  Result Date: 05/30/2018 CLINICAL  DATA:  Aspiration pneumonia. EXAM: PORTABLE CHEST 1 VIEW COMPARISON:  One-view chest x-ray 05/29/2018 FINDINGS: Heart size is normal. Endotracheal tube terminates 3 cm above the carina. NG tube is in the stomach. Right IJ line is stable. Right lower lobe airspace disease is again seen. Mild edema is slightly increased. Minimal left basilar atelectasis is stable. IMPRESSION: 1. Persistent right lower lobe airspace disease compatible with pneumonia or aspiration. 2. Slight progression in edema and left basilar atelectasis. Electronically Signed   By: Marin Roberts M.D.   On: 05/30/2018 07:42   Dg Chest Port 1 View  Result Date: 05/29/2018 CLINICAL DATA:  Respiratory failure EXAM: PORTABLE CHEST 1 VIEW COMPARISON:  05/28/2018 FINDINGS: Cardiac shadow is stable. Right jugular central line, endotracheal tube and nasogastric catheter are again seen and stable. The left lung is predominantly clear although some left retrocardiac infiltrate is seen. Diffuse infiltrates are noted throughout the right lung stable in appearance when compared with the prior exam. Small right pleural effusion is noted as well. IMPRESSION: Stable bilateral infiltrates right greater than left with associated right effusion. Tubes  and lines as described. Electronically Signed   By: Alcide Clever M.D.   On: 05/29/2018 07:07   Dg Chest Port 1 View  Result Date: 05/28/2018 CLINICAL DATA:  Pneumonia. EXAM: PORTABLE CHEST 1 VIEW COMPARISON:  05/28/2018. FINDINGS: Endotracheal tube, NG tube, right IJ line in stable position. Heart size stable. Diffuse bilateral pulmonary infiltrates, particular on the right side, and right-sided pleural effusion again noted. Similar findings noted on prior exam. IMPRESSION: 1.  Lines and tubes stable position. 2. Diffuse bilateral pulmonary infiltrates, right side greater than left. Right-sided pleural effusion. Similar findings on prior exam. Electronically Signed   By: Maisie Fus  Register   On: 05/28/2018 11:47   Dg Chest Portable 1 View  Result Date: 05/28/2018 CLINICAL DATA:  ARDS, respiratory failure and aspiration pneumonia. EXAM: PORTABLE CHEST 1 VIEW COMPARISON:  05/27/2018 FINDINGS: Endotracheal tube remains with the tip projecting approximately 4 cm above the carina. Right jugular central line shows stable positioning with the tip in the lower SVC. Gastric decompression tube extends into the stomach. Relatively stable airspace consolidation bilaterally, right greater than left with potentially mildly improved aeration at the right base. No significant pleural effusions. No pneumothorax. Stable heart size. IMPRESSION: Relatively stable bilateral airspace consolidation with potentially slightly improved aeration at the right base. Electronically Signed   By: Irish Lack M.D.   On: 05/28/2018 07:53   Dg Chest Port 1 View  Result Date: 05/27/2018 CLINICAL DATA:  Shortness of breath, ARDS EXAM: PORTABLE CHEST 1 VIEW COMPARISON:  Portable chest x-ray of 05/26/2018 FINDINGS: There is slightly better aeration of the right upper lung. There appear to be bilateral pleural effusions. Opacity at the right lung base remains most likely due to pneumonia. The tip of the endotracheal tube is  approximatelymm 2.0 cm above the carina. NG tube extends into the stomach. IMPRESSION: 1. Tip of endotracheal tube 2.0 cm above the carina. 2. Slightly.  Aeration of the right upper lung. 3. Bilateral pleural effusions and basilar atelectasis. Cannot exclude pneumonia particularly the right lung base. Electronically Signed   By: Dwyane Dee M.D.   On: 05/27/2018 10:08   Dg Chest Port 1 View  Result Date: 05/26/2018 CLINICAL DATA:  Acute respiratory failure EXAM: PORTABLE CHEST 1 VIEW COMPARISON:  Portable chest x-ray of May 25, 2018 FINDINGS: There is developed further opacification of much of the right lung. The greatest density is inferiorly.  On the left there is increasing interstitial and alveolar opacity in the mid and lower lung. The heart borders are partially obscured. The heart is top-normal in size. The pulmonary vascularity is not clearly engorged. The endotracheal tube tip is at the carina. The esophagogastric tube tip and proximal port project below the GE junction. The right internal jugular venous catheter tip projects over the midportion of the SVC. IMPRESSION: Progressive opacification of both lungs worrisome for widespread pneumonia. Low positioning of the endotracheal tube. Withdrawal by 2 cm would help assure that the tube does not migrate into the right mainstem bronchus with patient movement. Electronically Signed   By: David  [place] M.D.   On: 05/26/2018 08:40   Dg Chest Port 1 View  Result Date: 05/25/2018 CLINICAL DATA:  Acute respiratory failure EXAM: PORTABLE CHEST 1 VIEW COMPARISON:  05/23/2018 FINDINGS: Endotracheal tube, nasogastric catheter and right jugular central line are again seen. Cardiac shadow is mildly enlarged but stable. Diffuse right-sided infiltrate is noted with some increasing consolidation in the lateral right lung base. Left retrocardiac infiltrate is noted as well. No pneumothorax is identified. IMPRESSION: Bilateral infiltrates right greater than left,  increasing in the right lung base. Electronically Signed   By: Alcide Clever M.D.   On: 05/25/2018 07:24   Dg Chest Port 1 View  Result Date: 05/23/2018 CLINICAL DATA:  Suspected DVT.  Endotracheal tube. EXAM: PORTABLE CHEST 1 VIEW COMPARISON:  One-view chest x-ray 05/23/2018 at 4:13 a.m. FINDINGS: Heart size is normal. Pacing wires are in place. NG tube courses off the inferior border of the film. The endotracheal tube has been pulled back, now terminating 4.5 cm above the carina. Right middle lobe and lower lobe airspace disease is again seen. Asymmetric edema is present on the right. IMPRESSION: 1. Interval adjustment of endotracheal tube, now in satisfactory position. 2. Right middle and lower lobe airspace disease. This is concerning for infection versus aspiration. Underlying mass is not excluded. Electronically Signed   By: Marin Roberts M.D.   On: 05/23/2018 07:28   Dg Chest Portable 1 View  Result Date: 05/23/2018 CLINICAL DATA:  Intubation EXAM: PORTABLE CHEST 1 VIEW COMPARISON:  None. FINDINGS: Endotracheal tube tip is within the proximal right mainstem bronchus. This should be retracted by 4.5 cm to place it at the level of the clavicular heads. There is a large area of right parahilar consolidation. The left lung is clear. No pleural effusion or pneumothorax. The nasogastric tube side port is below the diaphragm but not clearly visualized. A dedicated abdominal radiograph may be helpful. IMPRESSION: 1. Endotracheal tube tip in the proximal right mainstem bronchus. Retraction by 4 cm recommended. 2. Nasogastric tube side port below the diaphragm but not clearly visualized. Dedicated abdominal radiograph may be helpful. 3. Large area of right parahilar and medial right lung apex consolidation which may indicate infection or neoplastic process. Chest CT or Followup PA and lateral chest X-ray is recommended in 3-4 weeks following trial of antibiotic therapy to ensure resolution and exclude  underlying malignancy. These results were called by telephone at the time of interpretation on 05/23/2018 at 5:18 am to Dr. Jaci Carrel , who verbally acknowledged these results. Electronically Signed   By: Deatra Robinson M.D.   On: 05/23/2018 05:18   Dg Abd Portable 1v  Result Date: 06/08/2018 CLINICAL DATA:  Nasogastric tube placement. EXAM: PORTABLE ABDOMEN - 1 VIEW COMPARISON:  Abdominal radiograph May 23, 2018 FINDINGS: Feeding tube tip projects in second portion of duodenum. Included bowel  gas pattern is nondilated and nonobstructive. No intra-abdominal mass effect. Reticulonodular densities and alveolar airspace opacities included lung bases. Soft tissue planes and included osseous structures are non suspicious. IMPRESSION: Feeding tube tip projects in duodenum. Electronically Signed   By: Awilda Metro M.D.   On: 06/08/2018 16:09   Dg Abd Portable 1v  Result Date: 05/23/2018 CLINICAL DATA:  OG tube placement. EXAM: PORTABLE ABDOMEN - 1 VIEW COMPARISON:  One-view chest x-ray of the same day. FINDINGS: OG tube is now in place. The side port is in the mid stomach. Pacing leads are stable. Right middle and lower lobe pneumonia are stable. IMPRESSION: 1. Satisfactory positioning of OG tube in the stomach. 2. Right lower lobe pneumonia. Electronically Signed   By: Marin Roberts M.D.   On: 05/23/2018 10:44   [place] Ekg Site Rite  Result Date: 06/01/2018 If Site Rite image not attached, placement could not be confirmed due to current cardiac rhythm.    LOS: 25 days   Jeoffrey Massed, MD  [facility]  If 7PM-7AM, please contact night-coverage  Please page via [email or web address]-click on MD name and type text message  06/17/2018, 10:49 AM

## 2018-06-17 NOTE — Progress Notes (Signed)
Occupational Therapy Treatment Patient Details Name: Courtney Beard MRN: [id number] DOB: September 22, 1950 Today's Date: 06/17/2018    History of present illness Pt is a 67 y/o female admitted secondary to Cardiac arrest complicated by aspiration PNA and ARDS secondary to overdose. Pt was intubated on 10/12 and was extubated 10/28. PMH includes Anxiety, Depression, ETOH abuse.    OT comments  Pt progressing towards established OT goals. Pt performing grooming at sink with Min guard A for safety. Pt performing UB dressing and toileting with Min A. Pt performing home distance functional mobility in hallway with Min-Mod A due to decreased balance and right lateral lean. Pt requiring Max cues for trail making task to return to room; presenting with decreased ST memory, problem solving, and awareness. Continue to recommend dc to CIR and will continue follow acutely as admitted.     Follow Up Recommendations  Supervision/Assistance - 24 hour;CIR    Equipment Recommendations  Other (comment)(TBD in next venue)    Recommendations for Other Services      Precautions / Restrictions Precautions Precautions: Fall;Other (comment) Precaution Comments: suicide precautions, feeding tube Restrictions Weight Bearing Restrictions: No       Mobility Bed Mobility Overal bed mobility: Modified Independent Bed Mobility: Sit to Supine       Sit to supine: Modified independent (Device/Increase time)   General bed mobility comments: Pt quickly crawled back into bed at the end of session. Pt stated "I want to go to sleep"  Transfers Overall transfer level: Needs assistance Equipment used: 1 person hand held assist Transfers: Sit to/from Stand Sit to Stand: Min assist         General transfer comment: Min A to stabilize in standing.    Balance Overall balance assessment: Needs assistance Sitting-balance support: No upper extremity supported;Feet supported Sitting balance-Leahy Scale: Fair      Standing balance support: During functional activity;No upper extremity supported Standing balance-Leahy Scale: Poor Standing balance comment: Pt required physical assistance to stand.                           ADL either performed or assessed with clinical judgement   ADL Overall ADL's : Needs assistance/impaired Eating/Feeding: NPO   Grooming: Wash/dry hands;Brushing hair;Min guard;Standing Grooming Details (indicate cue type and reason): Pt washed/dryed hands and brushed hair standing at sink. Min guard for safety. Pt required cues and increased time for fatigue.         Upper Body Dressing : Minimal assistance Upper Body Dressing Details (indicate cue type and reason): Pt required Min A to don gown.     Toilet Transfer: Minimal assistance;With caregiver independent assisting;Ambulation;Regular Teacher, adult education Details (indicate cue type and reason): Husband helped pt to stand up. Toileting- Clothing Manipulation and Hygiene: Minimal assistance;Sit to/from stand Toileting - Clothing Manipulation Details (indicate cue type and reason): Pt required Min A for standing balance. Pt able to perform peri care.     Functional mobility during ADLs: Minimal assistance;Moderate assistance General ADL Comments: Min-Mod A during functional mobility for safety and balance. Pt required max verbal and visual cues to problem solve to find room.     Vision       Perception     Praxis      Cognition Arousal/Alertness: Awake/alert Behavior During Therapy: WFL for tasks assessed/performed Overall Cognitive Status: Impaired/Different from baseline Area of Impairment: Following commands;Safety/judgement;Problem solving  Following Commands: Follows one step commands with increased time Safety/Judgement: Decreased awareness of safety   Problem Solving: Slow processing;Decreased initiation;Requires verbal cues;Requires tactile cues General  Comments: Requiring increased time and cues. Performing two part trail making task to return to room. Pt requiring Max cues to problem solve and recall room number.         Exercises General Exercises - Lower Extremity Long Arc Quad: (2 reps )   Shoulder Instructions       General Comments Husband and sitter present throughout session.    Pertinent Vitals/ Pain       Pain Assessment: Faces Pain Score: 0-No pain Faces Pain Scale: No hurt Pain Intervention(s): Monitored during session  Home Living                                          Prior Functioning/Environment              Frequency  Min 2X/week        Progress Toward Goals  OT Goals(current goals can now be found in the care plan section)  Progress towards OT goals: Progressing toward goals  Acute Rehab OT Goals Patient Stated Goal: regain strength, get back to independence OT Goal Formulation: With patient Time For Goal Achievement: 06/26/18 Potential to Achieve Goals: Good ADL Goals Pt Will Perform Grooming: with supervision;sitting Pt Will Perform Lower Body Bathing: with min guard assist;sit to/from stand Pt Will Perform Upper Body Dressing: with set-up;sitting Pt Will Perform Lower Body Dressing: with min guard assist;sit to/from stand Pt Will Transfer to Toilet: with min guard assist;ambulating;regular height toilet Pt Will Perform Toileting - Clothing Manipulation and hygiene: with min guard assist;sit to/from stand Pt/caregiver will Perform Home Exercise Program: Increased strength;Both right and left upper extremity;With Supervision;With written HEP provided  Plan      Co-evaluation                 AM-PAC PT "6 Clicks" Daily Activity     Outcome Measure   Help from another person eating meals?: Total(NPO) Help from another person taking care of personal grooming?: A Little Help from another person toileting, which includes using toliet, bedpan, or urinal?: A  Little Help from another person bathing (including washing, rinsing, drying)?: A Little Help from another person to put on and taking off regular upper body clothing?: A Little Help from another person to put on and taking off regular lower body clothing?: A Little 6 Click Score: 16    End of Session Equipment Utilized During Treatment: Gait belt  OT Visit Diagnosis: Muscle weakness (generalized) (M62.81);Unsteadiness on feet (R26.81)   Activity Tolerance Patient tolerated treatment well;Patient limited by fatigue   Patient Left in bed;with call bell/phone within reach;with nursing/sitter in room;with family/visitor present   Nurse Communication Mobility status        Time: 1131-1153 OT Time Calculation (min): 22 min  Charges: OT General Charges $OT Visit: 1 Visit OT Treatments $Self Care/Home Management : 8-22 mins  Courtney Beard MSOT, Courtney Beard Acute Rehab Pager: [phone] Office: [phone]   Courtney Beard 06/17/2018, 1:02 PM

## 2018-06-17 NOTE — Progress Notes (Signed)
Physical Therapy Treatment Patient Details Name: Courtney Beard MRN: [id number] DOB: 11-15-1950 Today's Date: 06/17/2018    History of Present Illness Pt is a 67 y/o female admitted secondary to Cardiac arrest complicated by aspiration PNA and ARDS secondary to overdose. Pt was intubated on 10/12 and was extubated 10/28. PMH includes Anxiety, Depression, ETOH abuse.     PT Comments    Pt continues to display instability with gait needing min assist if using IV pole or mod assist when asked to ambulate without AD.  Pt needs significant cues in finding her way back to the room.  Follow Up Recommendations  CIR     Equipment Recommendations  None recommended by PT    Recommendations for Other Services       Precautions / Restrictions Precautions Precautions: Fall;Other (comment) Precaution Comments: suicide precautions, feeding tube    Mobility  Bed Mobility Overal bed mobility: Modified Independent                Transfers Overall transfer level: Needs assistance   Transfers: Sit to/from Stand Sit to Stand: Min assist            Ambulation/Gait Ambulation/Gait assistance: Min assist;Mod assist(light mod assist without AD) Gait Distance (Feet): 130 Feet Assistive device: None;IV Pole Gait Pattern/deviations: Step-through pattern Gait velocity: decreased Gait velocity interpretation: <1.8 ft/sec, indicate of risk for recurrent falls General Gait Details: slow, unsteady gait, mildy adducted or scissoring without use of AD.   Stairs             Wheelchair Mobility    Modified Rankin (Stroke Patients Only)       Balance Overall balance assessment: Needs assistance Sitting-balance support: No upper extremity supported Sitting balance-Leahy Scale: Fair       Standing balance-Leahy Scale: Poor Standing balance comment: steady assist to stand                            Cognition Arousal/Alertness: Awake/alert Behavior During  Therapy: WFL for tasks assessed/performed Overall Cognitive Status: Impaired/Different from baseline                         Following Commands: Follows one step commands with increased time Safety/Judgement: Decreased awareness of safety   Problem Solving: Decreased initiation        Exercises      General Comments General comments (skin integrity, edema, etc.): sitter present.      Pertinent Vitals/Pain Pain Assessment: Faces Faces Pain Scale: No hurt    Home Living                      Prior Function            PT Goals (current goals can now be found in the care plan section) Acute Rehab PT Goals Patient Stated Goal: regain strength, get back to independence PT Goal Formulation: With patient Time For Goal Achievement: 06/26/18 Potential to Achieve Goals: Good Progress towards PT goals: Progressing toward goals    Frequency    Min 3X/week      PT Plan Current plan remains appropriate    Co-evaluation              AM-PAC PT "6 Clicks" Daily Activity  Outcome Measure  Difficulty turning over in bed (including adjusting bedclothes, sheets and blankets)?: None Difficulty moving from lying on back to sitting on the side of  the bed? : None Difficulty sitting down on and standing up from a chair with arms (e.g., wheelchair, bedside commode, etc,.)?: A Little Help needed moving to and from a bed to chair (including a wheelchair)?: A Little Help needed walking in hospital room?: A Little Help needed climbing 3-5 steps with a railing? : A Lot 6 Click Score: 19    End of Session   Activity Tolerance: Patient tolerated treatment well Patient left: Other (comment);with nursing/sitter in room(on toilet with sitter) Nurse Communication: Mobility status PT Visit Diagnosis: Other abnormalities of gait and mobility (R26.89);Muscle weakness (generalized) (M62.81);Difficulty in walking, not elsewhere classified (R26.2)     Time:  1735-1750 PT Time Calculation (min) (ACUTE ONLY): 15 min  Charges:  $Gait Training: 8-22 mins                     06/17/2018  [place] Bing, PT Acute Rehabilitation Services [phone]  (pager) [phone]  (office)   Eliseo Gum [name] 06/17/2018, 5:58 PM

## 2018-06-17 NOTE — Progress Notes (Signed)
  Speech Language Pathology Treatment: Dysphagia  Patient Details Name: Courtney Beard MRN: [id number] DOB: 1950/10/22 Today's Date: 06/17/2018 Time: 1010-1050 SLP Time Calculation (min) (ACUTE ONLY): 40 min  Assessment / Plan / Recommendation Clinical Impression  Patient up in chair and alert. She was able to hold a conversation about the foods she would like to have when she is able to eat and follow all commands. Her voice is still hoarse (improving), but audible. Her cough was weak but effective to independently clear secretions by oral suction. PO trials of ice chip, thin, nectar, honey thickened liquids and applesauce given. Patient had an immediate cough with thin and nectar thickened liquids by tsp. She had no cough response with ice chips, honey thickened liquid (tsp or cup) or applesauce. Patient had good bolus awareness, manipulation and transfer, suspect delayed swallow initiation resulting in immediate cough. Patient tolerated pureed and honey thickened liquids fairly well on FEES study (11/4) and appears to have improved sensation, ability to cough and clear secretions, a diet was cautiously initiated (Dys 1, honey thick liquids, no straw).  Education provided to patient and husband about aspiration precautions, plan to continue speech therapy for diet tolerance, diet upgrade, continued swallowing exercises and ongoing education.    HPI HPI: 67 y/o female admitted 05/23/18 due to post cardiac arrest in setting of presumed intentional overdose with benzodiazepine and muscle relaxants.  Bystander CPR initiated, has aspiration pneumonia and ARDS. ETT 10/12-28. Has cortrak.  Since extubation she has remained confused and periodically agitated.  She has copious secretions but has difficulty expectorating them effectively and has required frequent suctioning NT suctioning.       SLP Plan  Continue with current plan of care       Recommendations  Diet recommendations: Dysphagia 1  (puree);Honey-thick liquid Liquids provided via: Cup Medication Administration: Crushed with puree Supervision: Intermittent supervision to cue for compensatory strategies Compensations: Minimize environmental distractions;Slow rate;Small sips/bites Postural Changes and/or Swallow Maneuvers: Out of bed for meals                Oral Care Recommendations: Oral care QID Follow up Recommendations: Inpatient Rehab SLP Visit Diagnosis: Dysphagia, oropharyngeal phase (R13.12) Plan: Continue with current plan of care       GO                Lindalou Hose Madason Rauls, MA, CCC-SLP 06/17/2018 11:05 AM

## 2018-06-18 ENCOUNTER — Encounter (HOSPITAL_COMMUNITY): Payer: Self-pay | Admitting: General Practice

## 2018-06-18 LAB — CBC
HEMATOCRIT: 27.8 % — AB (ref 36.0–46.0)
HEMOGLOBIN: 8.7 g/dL — AB (ref 12.0–15.0)
MCH: 24.4 pg — ABNORMAL LOW (ref 26.0–34.0)
MCHC: 31.3 g/dL (ref 30.0–36.0)
MCV: 78.1 fL — AB (ref 80.0–100.0)
NRBC: 0 % (ref 0.0–0.2)
PLATELETS: 550 10*3/uL — AB (ref 150–400)
RBC: 3.56 MIL/uL — AB (ref 3.87–5.11)
RDW: 16.3 % — ABNORMAL HIGH (ref 11.5–15.5)
WBC: 10.2 10*3/uL (ref 4.0–10.5)

## 2018-06-18 LAB — BASIC METABOLIC PANEL
ANION GAP: 11 (ref 5–15)
BUN: 19 mg/dL (ref 8–23)
CHLORIDE: 102 mmol/L (ref 98–111)
CO2: 23 mmol/L (ref 22–32)
Calcium: 9.4 mg/dL (ref 8.9–10.3)
Creatinine, Ser: 0.71 mg/dL (ref 0.44–1.00)
GFR calc Af Amer: >60 mL/min (ref >60)
Glucose, Bld: 109 mg/dL — ABNORMAL HIGH (ref 70–99)
POTASSIUM: 4.2 mmol/L (ref 3.5–5.1)
Sodium: 136 mmol/L (ref 135–145)

## 2018-06-18 MED ORDER — ESCITALOPRAM OXALATE 20 MG PO TABS
20.0000 mg | ORAL_TABLET | Freq: Every day | ORAL | Status: DC
  Administered 2018-06-18 – 2018-06-19 (×2): 20 mg via ORAL
  Filled 2018-06-18 (×2): qty 1

## 2018-06-18 MED ORDER — VALPROIC ACID 250 MG/5ML PO SOLN
500.0000 mg | Freq: Two times a day (BID) | ORAL | Status: DC
  Filled 2018-06-18: qty 10

## 2018-06-18 NOTE — Progress Notes (Signed)
  Speech Language Pathology Treatment: Dysphagia  Patient Details Name: Courtney Beard MRN: [id number] DOB: [birth year] Today's Date: 06/18/2018 Time: 4098-1191 SLP Time Calculation (min) (ACUTE ONLY): 16 min  Assessment / Plan / Recommendation Clinical Impression  Pt was fully alert, engaged and interactive with conversation regarding her recent progress toward recovery. She expressed satisfaction with recent initiation of a PO diet however overall decreased appetite due to deconditioning. Of note, pt exhibited congested/wet vocal quality at baseline, which remained throughout the session. Given min verbal cues, pt accepted honey thick liquids and regular texture (graham cracker) POs; she exhibited one instance of delayed cough after all intake. Given improvements in mastication and attention to POs, recommend upgrade to dysphagia 3 (chopped) texture, continue honey thick liquids with good prognosis to advance liquids after follow up objective MBSS in the near future. Pt agreeable to continue with Cortrak as supplemental nutrition until PO intake increases; new upgraded diet texture hopeful to aid in greater desire to consume POs. Dietician may wish to decrease volume of NG tube feeds to further assist in pt's appetite/wean from Cortrak as primary source of nutrition.   HPI HPI: Courtney Beard admitted 05/23/18 due to post cardiac arrest in setting of presumed intentional overdose with benzodiazepine and muscle relaxants.  Bystander CPR initiated, has aspiration pneumonia and ARDS. ETT 10/12-28. Has cortrak.  Since extubation she has remained confused and periodically agitated.  She has copious secretions but has difficulty expectorating them effectively and has required frequent suctioning NT suctioning.       SLP Plan  Continue with current plan of care       Recommendations  Diet recommendations: Dysphagia 3 (mechanical soft);Honey-thick liquid Liquids provided via: Cup Medication  Administration: Crushed with puree Supervision: Intermittent supervision to cue for compensatory strategies Compensations: Minimize environmental distractions;Slow rate;Small sips/bites Postural Changes and/or Swallow Maneuvers: Seated upright 90 degrees                Oral Care Recommendations: Oral care BID Follow up Recommendations: Inpatient Rehab SLP Visit Diagnosis: Dysphagia, unspecified (R13.10) Plan: Continue with current plan of care       Suzzette Righter, Student SLP                Suzzette Righter 06/18/2018, 3:56 PM

## 2018-06-18 NOTE — PMR Pre-admission (Signed)
PMR Admission Coordinator Pre-Admission Assessment  Patient: Courtney Beard is an 67 y.o., female MRN: [id number] DOB: July 07, 1951 Height: 5\' 2"  (157.5 cm) Weight: 45.6 kg              Insurance Information HMO:   PPO: yes     PCP:      IPA:      80/20:      OTHER: Medicare advantage plan PRIMARY: [employer] Medicare      Policy#: [id number]      Subscriber: pt CM Name: Rosh    Phone#: no phone avaialble     Fax#: [phone] Pre-Cert#: [id number]   For 7 days with f/u Dirk Dress phone [phone] on 11/14  Employer: retired Benefits:  Phone #: [phone]     Name: 06/17/2018 Eff. Date: 12/10/2017     Deduct: none      Out of Pocket Max: $4200      Life Max: none CIR: $250 co pay per day for 6 days      SNF: no co pay days 1 until 20; $164 co pay per day days 21 until 100 Outpatient: $40 co pay per visit     Co-Pay: visits per medical necessity Home Health: 100%      Co-Pay: visits per medical neccesity DME: 80%     Co-Pay: 20% Providers: in network  SECONDARY: none      Medicaid Application Date:       Case Manager:  Disability Application Date:       Case Worker:   Emergency Conservator, museum/gallery Information    Name Relation Home Work [place], [place] Spouse   [phone]   Joelly, Bolanos Son   [phone]   Korinna, Tat   [phone]     Current Medical History  Patient Admitting Diagnosis: debility secondary to drug overdose  History of Present Illness:  Courtney Beard is a 67 year old right-handed female with history of depression, alcohol use.  Presented 05/23/2018 after being found unresponsive with empty bottles of Klonopin and Zanaflex at bedside. CPR was initiated. Blood pressure 80/40. Patient required intubation. Cranial CT scan reviewed, unremarkable for acute intracranial process. Alcohol level 63, ammonia level 36, troponin level negative, lactic acid 3.21, urine drug screen positive benzos, CK of 508. Echocardiogram  with ejection fraction of 65% no wall motion abnormalities. Patient was extubated 06/08/2018. Hospital course septic shock protocol with acute hypoxic respiratory failure. Subcutaneous heparin for DVT prophylaxis. Nasogastric tube in place for nutritional support and diet advanced to a dysphagia #1 Honey liquid. Psychiatry follow-up awaiting plan for possible inpatient psychiatric hospitalization. Cortrak removed on 06/19/18.  Past Medical History  Past Medical History:  Diagnosis Date  . Acute blood loss anemia 06/2018  . Anemia   . Depression   . H/O ETOH abuse     Family History  family history is not on file.  Prior Rehab/Hospitalizations:  Has the patient had major surgery during 100 days prior to admission? No  Current Medications   Current Facility-Administered Medications:  .  0.45 % sodium chloride infusion, , Intravenous, Continuous, Ghimire, Werner Lean, MD, Last Rate: 10 mL/hr at 06/18/18 2149 .  acetaminophen (TYLENOL) tablet 650 mg, 650 mg, Oral, Q6H PRN, Leslye Peer, MD, 650 mg at 06/19/18 1118 .  albuterol (PROVENTIL) (2.5 MG/3ML) 0.083% nebulizer solution 2.5 mg, 2.5 mg, Nebulization, Q2H PRN, Max Fickle B, MD, 2.5 mg at 06/09/18 2139 .  alteplase (CATHFLO ACTIVASE) injection 2 mg, 2 mg, Intracatheter, Once,  Ghimire, Werner Lean, MD .  bisacodyl (DULCOLAX) suppository 10 mg, 10 mg, Rectal, Daily PRN, Max Fickle B, MD, 10 mg at 06/03/18 1001 .  Chlorhexidine Gluconate Cloth 2 % PADS 6 each, 6 each, Topical, Daily, Max Fickle B, MD, 6 each at 06/19/18 1023 .  clonazePAM (KLONOPIN) tablet 0.5 mg, 0.5 mg, Oral, BID PRN, Maretta Bees, MD, 0.5 mg at 06/16/18 2233 .  escitalopram (LEXAPRO) tablet 20 mg, 20 mg, Oral, Daily, Ghimire, Werner Lean, MD, 20 mg at 06/19/18 1023 .  famotidine (PEPCID) 40 MG/5ML suspension 20 mg, 20 mg, Oral, Daily, Bland, Scott, DO, 20 mg at 06/19/18 1023 .  feeding supplement (JEVITY 1.2 CAL) liquid 1,000 mL, 1,000 mL, Per Tube,  Continuous, Ghimire, Werner Lean, MD, Stopped at 06/19/18 0802 .  folic acid (FOLVITE) tablet 1 mg, 1 mg, Per Tube, Daily, Oretha Milch, MD, 1 mg at 06/19/18 1023 .  food thickener (THICK IT) powder, , Oral, PRN, Ghimire, Werner Lean, MD .  free water 100 mL, 100 mL, Per Tube, Q4H, Ghimire, Werner Lean, MD, Stopped at 06/19/18 0700 .  guaiFENesin (ROBITUSSIN) 100 MG/5ML solution 200 mg, 10 mL, Per Tube, Q4H PRN, Agarwala, Ravi, MD, 200 mg at 06/16/18 2234 .  heparin injection 5,000 Units, 5,000 Units, Subcutaneous, Q8H, Tobey Grim, NP, 5,000 Units at 06/19/18 0526 .  labetalol (NORMODYNE,TRANDATE) injection 20 mg, 20 mg, Intravenous, Q10 min PRN, Simonne Martinet, NP, 20 mg at 05/26/18 1338 .  lip balm (CARMEX) ointment, , Topical, PRN, Lupita Leash, MD, 1 application at 06/12/18 1656 .  MEDLINE mouth rinse, 15 mL, Mouth Rinse, BID, Agarwala, Ravi, MD, 15 mL at 06/19/18 1024 .  polyethylene glycol (MIRALAX / GLYCOLAX) packet 17 g, 17 g, Oral, Daily PRN, Max Fickle B, MD, 17 g at 06/02/18 1501 .  potassium chloride 20 MEQ/15ML (10%) solution 40 mEq, 40 mEq, Per Tube, BID, Max Fickle B, MD, 40 mEq at 06/18/18 2131 .  sennosides (SENOKOT) 8.8 MG/5ML syrup 5 mL, 5 mL, Per Tube, Daily PRN, Max Fickle B, MD, 5 mL at 06/03/18 1000 .  sodium chloride flush (NS) 0.9 % injection 10-40 mL, 10-40 mL, Intracatheter, Q12H, McQuaid, Douglas B, MD, 10 mL at 06/18/18 0819 .  sodium chloride flush (NS) 0.9 % injection 10-40 mL, 10-40 mL, Intracatheter, PRN, Max Fickle B, MD, 10 mL at 06/19/18 0851 .  thiamine (VITAMIN B-1) tablet 100 mg, 100 mg, Per Tube, Daily, Oretha Milch, MD, 100 mg at 06/19/18 1023  Patients Current Diet:  Diet Order            Diet general        DIET DYS 3 Room service appropriate? Yes; Fluid consistency: Honey Thick  Diet effective now              Precautions / Restrictions Precautions Precautions: Fall, Other (comment) Precaution Comments:  suicide precautions, feeding tube Restrictions Weight Bearing Restrictions: No   Has the patient had 2 or more falls or a fall with injury in the past year?No  Prior Activity Level Community (5-7x/wk): active, independent; driving, retired RT, works out gym regularly  Journalist, newspaper / Corporate investment banker Devices/Equipment: None Home Equipment: None  Prior Device Use: Indicate devices/aids used by the patient prior to current illness, exacerbation or injury? None of the above  Prior Functional Level Prior Function Level of Independence: Independent Comments: was active, going to the gym 4-5x/week  Self Care: Did the patient need help  bathing, dressing, using the toilet or eating?  Independent  Indoor Mobility: Did the patient need assistance with walking from room to room (with or without device)? Independent  Stairs: Did the patient need assistance with internal or external stairs (with or without device)? Independent  Functional Cognition: Did the patient need help planning regular tasks such as shopping or remembering to take medications? Independent  Current Functional Level Cognition  Overall Cognitive Status: Impaired/Different from baseline Orientation Level: Oriented to person, Oriented to place, Disoriented to time, Disoriented to situation(States 2019, not month.) Following Commands: Follows one step commands with increased time Safety/Judgement: Decreased awareness of safety General Comments: Requiring increased time and cues. Performing two part trail making task to return to room. Pt requiring Max cues to problem solve and recall room number.     Extremity Assessment (includes Sensation/Coordination)  Upper Extremity Assessment: Generalized weakness  Lower Extremity Assessment: Defer to PT evaluation RLE Deficits / Details: Able to perform heel slides, LAQ and ankle pumps.  LLE Deficits / Details: Able to perform heel slides, LAQ and ankle pumps.      ADLs  Overall ADL's : Needs assistance/impaired Eating/Feeding: NPO Grooming: Wash/dry hands, Brushing hair, Min guard, Standing Grooming Details (indicate cue type and reason): Pt washed/dryed hands and brushed hair standing at sink. Min guard for safety. Pt required cues and increased time for fatigue. Upper Body Bathing: Minimal assistance, Sitting Lower Body Bathing: Moderate assistance, Sit to/from stand Upper Body Dressing : Minimal assistance Upper Body Dressing Details (indicate cue type and reason): Pt required Min A to don gown. Lower Body Dressing: Maximal assistance, Sit to/from stand Lower Body Dressing Details (indicate cue type and reason): requires totalA to don socks this session Toilet Transfer: Minimal assistance, With caregiver independent assisting, Ambulation, Regular Toilet Toilet Transfer Details (indicate cue type and reason): Husband helped pt to stand up. Toileting- Clothing Manipulation and Hygiene: Minimal assistance, Sit to/from stand Toileting - Clothing Manipulation Details (indicate cue type and reason): Pt required Min A for standing balance. Pt able to perform peri care. Functional mobility during ADLs: Minimal assistance, Moderate assistance General ADL Comments: Min-Mod A during functional mobility for safety and balance. Pt required max verbal and visual cues to problem solve to find room.    Mobility  Overal bed mobility: Modified Independent Bed Mobility: Sit to Supine Supine to sit: Min assist Sit to supine: Modified independent (Device/Increase time) General bed mobility comments: Pt quickly crawled back into bed at the end of session. Pt stated "I want to go to sleep"    Transfers  Overall transfer level: Needs assistance Equipment used: 1 person hand held assist Transfers: Sit to/from Stand Sit to Stand: Min assist General transfer comment: Min A to stabilize in standing.    Ambulation / Gait / Stairs / Wheelchair Mobility   Ambulation/Gait Ambulation/Gait assistance: Min assist, Mod assist(light mod assist without AD) Gait Distance (Feet): 130 Feet Assistive device: None, IV Pole Gait Pattern/deviations: Step-through pattern General Gait Details: slow, unsteady gait, mildy adducted or scissoring without use of AD. Gait velocity: decreased Gait velocity interpretation: <1.8 ft/sec, indicate of risk for recurrent falls    Posture / Balance Dynamic Sitting Balance Sitting balance - Comments: Reliant on minG-minA to maintain sitting balance. increased assist required with extended time sitting upright due to fatigue  Balance Overall balance assessment: Needs assistance Sitting-balance support: No upper extremity supported Sitting balance-Leahy Scale: Fair Sitting balance - Comments: Reliant on minG-minA to maintain sitting balance. increased assist required with  extended time sitting upright due to fatigue  Standing balance support: During functional activity, No upper extremity supported Standing balance-Leahy Scale: Poor Standing balance comment: steady assist to stand    Special needs/care consideration BiPAP/CPAP n/a CPM n/a Continuous Drip IV n/a Dialysis n/a Life Vest n/a Oxygen n/a Special Bed n/a Trach Size n/a Wound Vac n/a Skin blister left thigh; moisture associated skin damage to sacrum; rash mid upper back Bowel mgmt: continent LBM 11/7 Bladder mgmt:continent Diabetic mgmt n/a Safety sitter 1 : 1 Cortrak removed 11/8   Previous Home Environment Living Arrangements: Spouse/significant other  Lives With: Spouse Available Help at Discharge: (spouse will take FMLA as needed; he works at [employer]. Rad te[employer]) Type of Home: House Home Layout: Two level, Able to live on main level with bedroom/bathroom Home Access: Stairs to enter Entrance Stairs-Rails: Right, Left Entrance Stairs-Number of Steps: stated there was a few steps  Bathroom Shower/Tub: Engineer, manufacturing systems:  Standard Bathroom Accessibility: Yes How Accessible: Accessible via walker Home Care Services: No  Discharge Living Setting Plans for Discharge Living Setting: Patient's home, Lives with (comment)(spouse) Type of Home at Discharge: House Discharge Home Layout: Two level, Able to live on main level with bedroom/bathroom Discharge Home Access: Stairs to enter Entrance Stairs-Rails: Right, Left Entrance Stairs-Number of Steps: 5 Discharge Bathroom Shower/Tub: Tub/shower unit Discharge Bathroom Toilet: Standard Discharge Bathroom Accessibility: Yes How Accessible: Accessible via walker Does the patient have any problems obtaining your medications?: No  Social/Family/Support Systems Patient Roles: Spouse, Parent Contact Information: spouse Anticipated Caregiver: spouse Anticipated Caregiver's Contact Information: see above Ability/Limitations of Caregiver: spouse wotks, but plans to take fmla Caregiver Availability: 24/7 Discharge Plan Discussed with Primary Caregiver: Yes Is Caregiver In Agreement with Plan?: No Does Caregiver/Family have Issues with Lodging/Transportation while Pt is in Rehab?: No  Goals/Additional Needs Patient/Family Goal for Rehab: supervision PT, OT, and SLP Expected length of stay: ELOS 10 to 14 days Special Service Needs: Recruitment consultant; psych to follow up on suicidal ideation Pt/Family Agrees to Admission and willing to participate: Yes Program Orientation Provided & Reviewed with Pt/Caregiver Including Roles  & Responsibilities: Yes  Decrease burden of Care through IP rehab admission: n/a  Possible need for SNF placement upon discharge: not anticipated. I have explained to spouse that patient likely will not need an inpatient psych admit after CIR. We would request psychiatry reassessment. Patient would need 24/7 supervision at d/c.  Patient Condition: This patient's medical and functional status has changed since the consult dated 06/15/2018 in which the  Rehabilitation Physician determined and documented that the patient was potentially appropriate for intensive rehabilitative care in an inpatient rehabilitation facility. Issues have been addressed and update has been discussed with Dr. Riley Kill and patient now appropriate for inpatient rehabilitation. Will admit to inpatient rehab today.   Preadmission Screen Completed By:  Clois Dupes, 06/19/2018 11:32 AM ______________________________________________________________________   Discussed status with Dr. Riley Kill on 06/19/2018 at  1131 and received telephone approval for admission today.  Admission Coordinator:  Clois Dupes, time 1610 Date 06/19/2018

## 2018-06-18 NOTE — Progress Notes (Signed)
Patient remains awake, alert, resting in bed looking at the same newspaper and ads and switching between two pairs of glasses. Patient Oriented to person and place, but states it is September 01, 2017. Patient stated "that's what I meant to say", when this RN stated it is November 7th. This RN offered Klonopin, but patient denies. Denies needs. NT remains in room with patient at this time. Will continue to monitor.

## 2018-06-18 NOTE — Progress Notes (Addendum)
Inpatient Rehabilitation Admissions Coordinator  I await insurance decision for a possible inpt rehab admit . I met with patient at bedside with her safety sitter and she is aware. I will follow up with spouse once insurance determination is made.   , RN, MSN Rehab Admissions Coordinator [phone] 06/18/2018 12:58 PM  

## 2018-06-18 NOTE — Progress Notes (Signed)
Patient resting in bed looking at a newspaper and ads at this time. Patient awake and alert. Patient oriented, but slightly poor judgement. Patient states that she has many family members coming and she does not want to be sleeping when they come. Patient notified that it was 0130. Patient stated that she knew and that she has slept plenty while here at the hospital.

## 2018-06-18 NOTE — Progress Notes (Signed)
PROGRESS NOTE        PATIENT DETAILS Name: Courtney Beard Age: 67 y.o. Sex: female Date of Birth: [birth year] Admit Date: 05/23/2018 Admitting Physician Reyes Ivan, MD [id number], Sonny Masters, MD  Brief Narrative: Patient is a 67 y.o. female with history of EtOH use, anxiety/depression presented with a cardiac arrest in the setting of overdose and aspiration pneumonia resulting in ARDS and prolonged ventilator dependence.  Per H&P, patient's husband found the patient on day of admission unresponsive-covered in emesis with no palpable pulse- with empty bottles of Klonopin and Zanaflex.  Patient was managed in the ICU, was on broad-spectrum antimicrobial therapy-finally extubated on 10/28.  Further hospital course has been complicated by delirium, dysphagia requiring Cortak tube placement.  Mental status slowly improving with supportive care-remains n.p.o with NG feedings in place--see below for further details.  Subjective: Completely awake and alert this morning-ambulated in the hallway with the husband.  She thinks she is tolerating dysphagia 1 diet well and wants the NG tube removed.  Assessment/Plan: Septic shock with acute hypoxic respiratory failure with ARDS secondary to aspiration pneumonia/HCAP: Sepsis pathophysiology has improved-required prolonged ventilator dependence-extubated on 10/28.  Has already completed a course of antimicrobial therapy.  Although Candida seen in sputum culture, this is thought to be a colonization/contamination rather than a true infection.  Continue supportive care-incentive spirometry/flutter valve.  Continue to titrate off oxygen as tolerated-lungs are clear this morning.  Acute toxic/metabolic encephalopathy: Multifactorial-secondary to hypoxia/sedatives/narcotics-she is completely awake and alert-encephalopathy seems to have improved.  Stop Depakote/Seroquel-restart Lexapro-minimize benzos as much as possible.  Sinus  tachycardia: Secondary to acute illness/numerous physiologic stressors/anemia.  Much improved-remains slightly tachycardic in the low 100s-continue telemetry monitoring.    Hypernatremia: Resolved.  Secondary to free water deficit-sodium levels have normalized with free water flushes via NG tube.  Follow electrolytes periodically.   Hypokalemia: Repleted-recheck periodically.  Dysphagia: Related to prolonged ventilator dependent/ET tube placement-voice quality has improved.  Speech therapy continues to follow-if continues to tolerate dysphagia 1 diet well-suspect NG tube can be removed soon.    Anemia: Secondary to critical illness-no overt evidence of blood loss.  Hemoglobin slowly improving-anemia panel not consistent with iron deficiency.    Thrombocytosis: Likely reactive-no indication for iron deficiency on lab data-improving slowly with improvement in her underlying clinical issues.  Follow CBC periodically.   Suspected intentional drug overdose: Status is rapidly improving-when asked if she remembers taking Zanaflex and Klonopin-she claims not to remember.  Mental status has improved-seen by psychiatry on 11/4-recommendations are to transfer to inpatient psych when she is medically stable.  Sitter remains in place.    Deconditioning/debility: Secondary to critical illness-appreciate PT eval-CIR following to see if she can come to CIR given significant debility/deconditioning and ongoing issues with dysphagia.   DVT Prophylaxis: Prophylactic Heparin  Code Status: Full code  Family Communication: Spouse at bedside  Disposition Plan: Remain inpatient  Antimicrobial agents: Anti-infectives (From admission, onward)   Start     Dose/Rate Route Frequency Ordered Stop   06/08/18 0100  vancomycin (VANCOCIN) IVPB 750 mg/150 ml premix     750 mg 150 mL/hr over 60 Minutes Intravenous Every 12 hours 06/07/18 1307 06/11/18 1341   06/06/18 0100  vancomycin (VANCOCIN) 500 mg in sodium  chloride 0.9 % 100 mL IVPB  Status:  Discontinued     500 mg 100 mL/hr over 60  Minutes Intravenous Every 12 hours 06/05/18 1119 06/07/18 1307   06/05/18 1200  meropenem (MERREM) 1 g in sodium chloride 0.9 % 100 mL IVPB     1 g 200 mL/hr over 30 Minutes Intravenous Every 8 hours 06/05/18 1119 06/11/18 2229   06/05/18 1130  vancomycin (VANCOCIN) IVPB 1000 mg/200 mL premix     1,000 mg 200 mL/hr over 60 Minutes Intravenous  Once 06/05/18 1119 06/05/18 1330   05/31/18 1000  ceFEPIme (MAXIPIME) 2 g in sodium chloride 0.9 % 100 mL IVPB  Status:  Discontinued     2 g 200 mL/hr over 30 Minutes Intravenous Every 12 hours 05/31/18 0903 06/05/18 1109   05/31/18 1000  vancomycin (VANCOCIN) 500 mg in sodium chloride 0.9 % 100 mL IVPB  Status:  Discontinued     500 mg 100 mL/hr over 60 Minutes Intravenous Every 12 hours 05/31/18 0903 06/02/18 1039   05/26/18 1300  cefTRIAXone (ROCEPHIN) 1 g in sodium chloride 0.9 % 100 mL IVPB  Status:  Discontinued     1 g 200 mL/hr over 30 Minutes Intravenous Every 24 hours 05/26/18 1031 05/31/18 0840   05/25/18 2200  vancomycin (VANCOCIN) IVPB 750 mg/150 ml premix  Status:  Discontinued     750 mg 150 mL/hr over 60 Minutes Intravenous Every 12 hours 05/25/18 1136 05/25/18 1348   05/24/18 2200  vancomycin (VANCOCIN) 500 mg in sodium chloride 0.9 % 100 mL IVPB  Status:  Discontinued     500 mg 100 mL/hr over 60 Minutes Intravenous Every 12 hours 05/24/18 0953 05/25/18 1136   05/24/18 1100  vancomycin (VANCOCIN) IVPB 1000 mg/200 mL premix     1,000 mg 200 mL/hr over 60 Minutes Intravenous  Once 05/24/18 0953 05/24/18 1109   05/23/18 0715  piperacillin-tazobactam (ZOSYN) IVPB 3.375 g  Status:  Discontinued     3.375 g 12.5 mL/hr over 240 Minutes Intravenous Every 8 hours 05/23/18 0706 05/26/18 0943      Procedures: ETT 10/12>>10/28 PICC 10/21  CONSULTS:  pulmonary/intensive care  Time spent: 25  minutes-Greater than 50% of this time was spent in counseling,  explanation of diagnosis, planning of further management, and coordination of care.  MEDICATIONS: Scheduled Meds: . alteplase  2 mg Intracatheter Once  . Chlorhexidine Gluconate Cloth  6 each Topical Daily  . escitalopram  20 mg Oral Daily  . famotidine  20 mg Oral Daily  . folic acid  1 mg Per Tube Daily  . free water  100 mL Per Tube Q4H  . heparin  5,000 Units Subcutaneous Q8H  . mouth rinse  15 mL Mouth Rinse BID  . potassium chloride  40 mEq Per Tube BID  . sodium chloride flush  10-40 mL Intracatheter Q12H  . thiamine  100 mg Per Tube Daily   Continuous Infusions: . sodium chloride 10 mL/hr at 06/16/18 1224  . feeding supplement (JEVITY 1.2 CAL) 1,000 mL (06/16/18 1809)   PRN Meds:.acetaminophen, albuterol, bisacodyl, clonazePAM, food thickener, guaiFENesin, labetalol, lip balm, polyethylene glycol, sennosides, sodium chloride flush   PHYSICAL EXAM: Vital signs: Vitals:   06/17/18 0551 06/17/18 2005 06/18/18 0500 06/18/18 0520  BP: 99/62 112/69  125/82  Pulse: (!) 108 (!) 111  (!) 106  Resp: 20 16  18   Temp: 98.3 F (36.8 C) (!) 97.4 F (36.3 C)  (!) 97.4 F (36.3 C)  TempSrc: Oral Oral  Oral  SpO2: 97% 100%  100%  Weight:   45.6 kg   Height:  Filed Weights   06/13/18 0500 06/15/18 0456 06/18/18 0500  Weight: 46.7 kg 45.8 kg 45.6 kg   Body mass index is 18.39 kg/m.   General appearance:Awake, alert, not in any distress.  Eyes:no scleral icterus. HEENT: Atraumatic and Normocephalic Neck: supple, no JVD. Resp:Good air entry bilaterally,no rales or rhonchi CVS: S1 S2 regular, no murmurs.  GI: Bowel sounds present, Non tender and not distended with no gaurding, rigidity or rebound. Extremities: B/L Lower Ext shows no edema, both legs are warm to touch Neurology:  Non focal Psychiatric: Normal judgment and insight. Normal mood. Musculoskeletal:No digital cyanosis Skin:No Rash, warm and dry Wounds:N/A  I have personally reviewed following labs and  imaging studies  LABORATORY DATA: CBC: Recent Labs  Lab 06/13/18 0459 06/14/18 0322 06/15/18 0320 06/16/18 0354 06/18/18 0428  WBC 16.2* 13.3* 11.0* 10.0 10.2  HGB 7.7* 7.3* 7.4* 7.7* 8.7*  HCT 26.1* 25.0* 24.6* 25.3* 27.8*  MCV 80.8 80.6 80.1 78.8* 78.1*  PLT 861* 724* 674* 641* 550*    Basic Metabolic Panel: Recent Labs  Lab 06/13/18 0459 06/14/18 0322 06/15/18 0320 06/16/18 0354 06/18/18 0428  NA 148* 142 140 140 136  K 3.5 3.6 3.7 3.8 4.2  CL 116* 111 108 105 102  CO2 28 27 28 27 23   GLUCOSE 112* 111* 93 110* 109*  BUN 30* 24* 19 19 19   CREATININE 0.65 0.59 0.71 0.71 0.71  CALCIUM 9.5 9.0 9.0 9.3 9.4    GFR: Estimated Creatinine Clearance: 50.5 mL/min (by C-G formula based on SCr of 0.71 mg/dL).  Liver Function Tests: No results for input(s): AST, ALT, ALKPHOS, BILITOT, PROT, ALBUMIN in the last 168 hours. No results for input(s): LIPASE, AMYLASE in the last 168 hours. No results for input(s): AMMONIA in the last 168 hours.  Coagulation Profile: No results for input(s): INR, PROTIME in the last 168 hours.  Cardiac Enzymes: No results for input(s): CKTOTAL, CKMB, CKMBINDEX, TROPONINI in the last 168 hours.  BNP (last 3 results) No results for input(s): PROBNP in the last 8760 hours.  HbA1C: No results for input(s): HGBA1C in the last 72 hours.  CBG: No results for input(s): GLUCAP in the last 168 hours.  Lipid Profile: No results for input(s): CHOL, HDL, LDLCALC, TRIG, CHOLHDL, LDLDIRECT in the last 72 hours.  Thyroid Function Tests: No results for input(s): TSH, T4TOTAL, FREET4, T3FREE, THYROIDAB in the last 72 hours.  Anemia Panel: No results for input(s): VITAMINB12, FOLATE, FERRITIN, TIBC, IRON, RETICCTPCT in the last 72 hours.  Urine analysis:    Component Value Date/Time   COLORURINE YELLOW 05/23/2018 0719   APPEARANCEUR CLEAR 05/23/2018 0719   LABSPEC 1.016 05/23/2018 0719   PHURINE 5.0 05/23/2018 0719   GLUCOSEU NEGATIVE 05/23/2018  0719   HGBUR SMALL (A) 05/23/2018 0719   BILIRUBINUR NEGATIVE 05/23/2018 0719   KETONESUR NEGATIVE 05/23/2018 0719   PROTEINUR NEGATIVE 05/23/2018 0719   NITRITE NEGATIVE 05/23/2018 0719   LEUKOCYTESUR NEGATIVE 05/23/2018 0719    Sepsis Labs: Lactic Acid, Venous    Component Value Date/Time   LATICACIDVEN 1.4 05/23/2018 1052    MICROBIOLOGY: No results found for this or any previous visit (from the past 240 hour(s)).  RADIOLOGY STUDIES/RESULTS: Dg Chest 1 View  Result Date: 05/23/2018 CLINICAL DATA:  Aspiration pneumonia. Acute hypoxemic respiratory failure. Central line placement. EXAM: CHEST  1 VIEW COMPARISON:  Prior today FINDINGS: A new right jugular central venous catheter is seen with tip overlying the distal SVC. Endotracheal tube and nasogastric tube remain in  appropriate position. No pneumothorax visualized. Worsening airspace disease is seen throughout the right lung. Mild airspace disease in the medial left lower lung also shows mild worsening. IMPRESSION: New right jugular central venous catheter in appropriate position. No pneumothorax visualized. Interval worsening of airspace disease throughout the right lung and in the medial left lower lobe. Electronically Signed   By: Myles Rosenthal M.D.   On: 05/23/2018 23:07   Ct Head Wo Contrast  Result Date: 05/23/2018 CLINICAL DATA:  Pulseless arrest, status post CPR. EXAM: CT HEAD WITHOUT CONTRAST CT CERVICAL SPINE WITHOUT CONTRAST TECHNIQUE: Multidetector CT imaging of the head and cervical spine was performed following the standard protocol without intravenous contrast. Multiplanar CT image reconstructions of the cervical spine were also generated. COMPARISON:  None. FINDINGS: CT HEAD FINDINGS Brain: There is no mass, hemorrhage or extra-axial collection. The size and configuration of the ventricles and extra-axial CSF spaces are normal. There is no acute or chronic infarction. The brain parenchyma is normal. Vascular: No abnormal  hyperdensity of the major intracranial arteries or dural venous sinuses. No intracranial atherosclerosis. Skull: The visualized skull base, calvarium and extracranial soft tissues are normal. Sinuses/Orbits: Fluid layering in the maxillary and sphenoid sinuses. The orbits are normal. CT CERVICAL SPINE FINDINGS Alignment: There is grade 1 anterolisthesis at C3-4, C4-5 and C5-6 secondary to facet hypertrophy. There is normal facet alignment. Lateral masses of C1 and C2 and the occipital condyles are aligned. Skull base and vertebrae: No acute fracture. Soft tissues and spinal canal: No prevertebral fluid or swelling. No visible canal hematoma. Disc levels: No advanced spinal canal or neural foraminal stenosis. Upper chest: Large area of consolidation in the right upper lobe. Other: Patient is intubated with the endotracheal tube tip beyond the field of view. IMPRESSION: 1. No acute intracranial abnormality. 2. No acute fracture of the cervical spine. 3. Large area of right upper lobe consolidation, possibly secondary to aspiration. Electronically Signed   By: Deatra Robinson M.D.   On: 05/23/2018 06:34   Ct Cervical Spine Wo Contrast  Result Date: 05/23/2018 CLINICAL DATA:  Pulseless arrest, status post CPR. EXAM: CT HEAD WITHOUT CONTRAST CT CERVICAL SPINE WITHOUT CONTRAST TECHNIQUE: Multidetector CT imaging of the head and cervical spine was performed following the standard protocol without intravenous contrast. Multiplanar CT image reconstructions of the cervical spine were also generated. COMPARISON:  None. FINDINGS: CT HEAD FINDINGS Brain: There is no mass, hemorrhage or extra-axial collection. The size and configuration of the ventricles and extra-axial CSF spaces are normal. There is no acute or chronic infarction. The brain parenchyma is normal. Vascular: No abnormal hyperdensity of the major intracranial arteries or dural venous sinuses. No intracranial atherosclerosis. Skull: The visualized skull base,  calvarium and extracranial soft tissues are normal. Sinuses/Orbits: Fluid layering in the maxillary and sphenoid sinuses. The orbits are normal. CT CERVICAL SPINE FINDINGS Alignment: There is grade 1 anterolisthesis at C3-4, C4-5 and C5-6 secondary to facet hypertrophy. There is normal facet alignment. Lateral masses of C1 and C2 and the occipital condyles are aligned. Skull base and vertebrae: No acute fracture. Soft tissues and spinal canal: No prevertebral fluid or swelling. No visible canal hematoma. Disc levels: No advanced spinal canal or neural foraminal stenosis. Upper chest: Large area of consolidation in the right upper lobe. Other: Patient is intubated with the endotracheal tube tip beyond the field of view. IMPRESSION: 1. No acute intracranial abnormality. 2. No acute fracture of the cervical spine. 3. Large area of right upper lobe consolidation, possibly  secondary to aspiration. Electronically Signed   By: Deatra Robinson M.D.   On: 05/23/2018 06:34   Dg Chest Port 1 View  Result Date: 06/10/2018 CLINICAL DATA:  Fall pneumonia EXAM: PORTABLE CHEST 1 VIEW COMPARISON:  06/08/2018 FINDINGS: Right-sided PICC line is again noted and stable. The endotracheal tube is been removed in the interval. The nasogastric catheter has been exchanged for a feeding tube. Patchy infiltrates are again noted bilaterally right greater than left. No bony abnormality is noted. IMPRESSION: Stable patchy infiltrates. Electronically Signed   By: Alcide Clever M.D.   On: 06/10/2018 10:55   Dg Chest Port 1 View  Result Date: 06/08/2018 CLINICAL DATA:  Acute respiratory failure with hypoxemia EXAM: PORTABLE CHEST 1 VIEW COMPARISON:  06/07/2018 FINDINGS: Cardiac shadow is stable. Endotracheal tube, nasogastric catheter and right-sided PICC line are again seen and stable. Patchy infiltrates are again identified throughout both lungs right greater than left. No sizable effusion is seen. No bony abnormality is noted. IMPRESSION:  Stable patchy infiltrates bilaterally right greater than left. Tubes and lines as described. Electronically Signed   By: Alcide Clever M.D.   On: 06/08/2018 07:24   Dg Chest Port 1 View  Result Date: 06/07/2018 CLINICAL DATA:  Acute respiratory failure with hypoxemia EXAM: PORTABLE CHEST 1 VIEW COMPARISON:  06/06/2018 FINDINGS: Multifocal patchy opacities, right lung predominant, grossly unchanged. No pleural effusion or pneumothorax. Endotracheal tube terminates 8 cm above the carina. Right arm PICC terminates at the cavoatrial junction. Enteric tube courses into the stomach. IMPRESSION: Multifocal pneumonia, right lobe predominant, unchanged. Endotracheal tube terminates 8 cm above the carina. Additional support apparatus as above. Electronically Signed   By: Charline Bills M.D.   On: 06/07/2018 08:36   Dg Chest Port 1 View  Result Date: 06/06/2018 CLINICAL DATA:  Acute respiratory failure.  Hypoxemia. EXAM: PORTABLE CHEST 1 VIEW COMPARISON:  June 05, 2018 FINDINGS: The ETT and right PICC line are stable. The NG tube terminates below today's film. Right greater than left patchy pulmonary infiltrates are similar in the interval. No other interval changes. IMPRESSION: Multifocal pneumonia remains, similar in the interval. Stable support apparatus. Electronically Signed   By: Gerome Sam III M.D   On: 06/06/2018 08:42   Dg Chest Port 1 View  Result Date: 06/05/2018 CLINICAL DATA:  Respiratory fail, aspiration pneumonia EXAM: PORTABLE CHEST 1 VIEW COMPARISON:  Portable exam 0429 hours compared to 06/04/2018 FINDINGS: Tip of endotracheal tube projects 4.3 cm above carina. Nasogastric tube extends into stomach. RIGHT arm PICC line tip projects over SVC. Normal heart size mediastinal contours. Patchy BILATERAL airspace infiltrates consistent with multifocal pneumonia, RIGHT greater than LEFT. Infiltrates appear mildly improved versus previous exam. No pleural effusion or pneumothorax. IMPRESSION:  Multifocal pneumonia, with mildly improved aeration versus previous exam. Electronically Signed   By: Ulyses Southward M.D.   On: 06/05/2018 10:10   Dg Chest Port 1 View  Result Date: 06/04/2018 CLINICAL DATA:  67 year old with respiratory failure. EXAM: PORTABLE CHEST 1 VIEW COMPARISON:  06/03/2018 FINDINGS: Endotracheal tube is 3.3 cm above the carina. Nasogastric tube extends into the abdomen. PICC line tip in the lower SVC region. Patchy parenchymal disease and airspace disease in the right lung has slightly improved, particularly at the right lung base. Probable right pleural effusion. However, there are new or worsening patchy airspace densities along the periphery of the left lung. Heart size is within normal limits and stable. Negative for a pneumothorax. IMPRESSION: 1. New or worsening airspace disease in left  lung. Findings are concerning for left lung pneumonia. 2. Persistent patchy airspace disease in the right lung is most compatible with pneumonia with slightly improved aeration at the right lung base. 3. Probable small pleural effusions. 4. Visualized support apparatuses are appropriately positioned. Electronically Signed   By: Richarda Overlie M.D.   On: 06/04/2018 10:13   Dg Chest Port 1 View  Result Date: 06/03/2018 CLINICAL DATA:  Acute respiratory failure with hypoxemia EXAM: PORTABLE CHEST 1 VIEW COMPARISON:  06/02/2018 FINDINGS: The patient is rotated to the right on today's radiograph, reducing diagnostic sensitivity and specificity. Endotracheal tube tip 2.8 cm above the carina, satisfactorily position. A nasogastric tube extends below the inferior margin of the radiograph. Right PICC line tip: Lower SVC. Continued extensive airspace opacity in the right lung, worsened at the right lung base compared to prior. There is some mild bandlike density in the left mid lung, increased in conspicuity. Heart size within normal limits. IMPRESSION: 1. Worsened airspace opacity at the right lung base with  continued notable right upper lobe airspace opacity. 2. Mild increase in bandlike density in the left mid lung, probably atelectasis. 3. Support apparatus satisfactorily position. Electronically Signed   By: Gaylyn Rong M.D.   On: 06/03/2018 10:28   Dg Chest Port 1 View  Result Date: 06/02/2018 CLINICAL DATA:  Aspiration into the airway. EXAM: PORTABLE CHEST 1 VIEW COMPARISON:  Most recent comparison yesterday at 0507 hour FINDINGS: Patient is significantly rotated. Endotracheal tube approximately 15 mm from the carina. Enteric tube in place with tip and side-port below the diaphragm. Right internal jugular central venous catheter in the mid SVC. Right upper extremity PICC with tip in the distal SVC. Patchy opacities throughout the right lung, most confluent in the lower lobe without significant interval change allowing for differences in technique. Slight improvement in left lung base opacity. Heart size and mediastinal contours are grossly unchanged, partially obscured by rotation. No pneumothorax. IMPRESSION: 1. No significant change in patchy opacities throughout the right lung allowing for differences in technique and rotation. 2. New right upper extremity PICC with tip in the distal SVC. Borderline low positioning of endotracheal tube tip 15 mm from the carina. 3. Slight improving left basilar aeration. Electronically Signed   By: Narda Rutherford M.D.   On: 06/02/2018 02:36   Dg Chest Port 1 View  Result Date: 06/01/2018 CLINICAL DATA:  67 year old female with pneumonia. Subsequent encounter. EXAM: PORTABLE CHEST 1 VIEW COMPARISON:  05/30/2018 chest x-ray. FINDINGS: Endotracheal tube tip 3.9 cm above the carina. Right central line tip mid superior vena cava level. No pneumothorax detected. Nasogastric tube courses below the diaphragm. Tip is not included on the present exam. Minimal improvement of consolidation right lower lobe and patchy consolidation right upper lobe which may represent  infectious infiltrate in proper clinical setting. Asymmetric pulmonary vascular congestion/mild pulmonary edema. Heart size within normal limits. IMPRESSION: 1. Minimal improvement of right lower lobe consolidation and patchy consolidation right upper lobe suggestive of result of pneumonia. 2. Asymmetric mild pulmonary vascular congestion/pulmonary edema without change. Electronically Signed   By: Lacy Duverney M.D.   On: 06/01/2018 07:22   Dg Chest Port 1 View  Result Date: 05/30/2018 CLINICAL DATA:  Aspiration pneumonia. EXAM: PORTABLE CHEST 1 VIEW COMPARISON:  One-view chest x-ray 05/29/2018 FINDINGS: Heart size is normal. Endotracheal tube terminates 3 cm above the carina. NG tube is in the stomach. Right IJ line is stable. Right lower lobe airspace disease is again seen. Mild edema is slightly  increased. Minimal left basilar atelectasis is stable. IMPRESSION: 1. Persistent right lower lobe airspace disease compatible with pneumonia or aspiration. 2. Slight progression in edema and left basilar atelectasis. Electronically Signed   By: Marin Roberts M.D.   On: 05/30/2018 07:42   Dg Chest Port 1 View  Result Date: 05/29/2018 CLINICAL DATA:  Respiratory failure EXAM: PORTABLE CHEST 1 VIEW COMPARISON:  05/28/2018 FINDINGS: Cardiac shadow is stable. Right jugular central line, endotracheal tube and nasogastric catheter are again seen and stable. The left lung is predominantly clear although some left retrocardiac infiltrate is seen. Diffuse infiltrates are noted throughout the right lung stable in appearance when compared with the prior exam. Small right pleural effusion is noted as well. IMPRESSION: Stable bilateral infiltrates right greater than left with associated right effusion. Tubes and lines as described. Electronically Signed   By: Alcide Clever M.D.   On: 05/29/2018 07:07   Dg Chest Port 1 View  Result Date: 05/28/2018 CLINICAL DATA:  Pneumonia. EXAM: PORTABLE CHEST 1 VIEW COMPARISON:   05/28/2018. FINDINGS: Endotracheal tube, NG tube, right IJ line in stable position. Heart size stable. Diffuse bilateral pulmonary infiltrates, particular on the right side, and right-sided pleural effusion again noted. Similar findings noted on prior exam. IMPRESSION: 1.  Lines and tubes stable position. 2. Diffuse bilateral pulmonary infiltrates, right side greater than left. Right-sided pleural effusion. Similar findings on prior exam. Electronically Signed   By: Maisie Fus  Register   On: 05/28/2018 11:47   Dg Chest Portable 1 View  Result Date: 05/28/2018 CLINICAL DATA:  ARDS, respiratory failure and aspiration pneumonia. EXAM: PORTABLE CHEST 1 VIEW COMPARISON:  05/27/2018 FINDINGS: Endotracheal tube remains with the tip projecting approximately 4 cm above the carina. Right jugular central line shows stable positioning with the tip in the lower SVC. Gastric decompression tube extends into the stomach. Relatively stable airspace consolidation bilaterally, right greater than left with potentially mildly improved aeration at the right base. No significant pleural effusions. No pneumothorax. Stable heart size. IMPRESSION: Relatively stable bilateral airspace consolidation with potentially slightly improved aeration at the right base. Electronically Signed   By: Irish Lack M.D.   On: 05/28/2018 07:53   Dg Chest Port 1 View  Result Date: 05/27/2018 CLINICAL DATA:  Shortness of breath, ARDS EXAM: PORTABLE CHEST 1 VIEW COMPARISON:  Portable chest x-ray of 05/26/2018 FINDINGS: There is slightly better aeration of the right upper lung. There appear to be bilateral pleural effusions. Opacity at the right lung base remains most likely due to pneumonia. The tip of the endotracheal tube is approximatelymm 2.0 cm above the carina. NG tube extends into the stomach. IMPRESSION: 1. Tip of endotracheal tube 2.0 cm above the carina. 2. Slightly.  Aeration of the right upper lung. 3. Bilateral pleural effusions and  basilar atelectasis. Cannot exclude pneumonia particularly the right lung base. Electronically Signed   By: Dwyane Dee M.D.   On: 05/27/2018 10:08   Dg Chest Port 1 View  Result Date: 05/26/2018 CLINICAL DATA:  Acute respiratory failure EXAM: PORTABLE CHEST 1 VIEW COMPARISON:  Portable chest x-ray of May 25, 2018 FINDINGS: There is developed further opacification of much of the right lung. The greatest density is inferiorly. On the left there is increasing interstitial and alveolar opacity in the mid and lower lung. The heart borders are partially obscured. The heart is top-normal in size. The pulmonary vascularity is not clearly engorged. The endotracheal tube tip is at the carina. The esophagogastric tube tip and proximal port project  below the GE junction. The right internal jugular venous catheter tip projects over the midportion of the SVC. IMPRESSION: Progressive opacification of both lungs worrisome for widespread pneumonia. Low positioning of the endotracheal tube. Withdrawal by 2 cm would help assure that the tube does not migrate into the right mainstem bronchus with patient movement. Electronically Signed   By: David  [place] M.D.   On: 05/26/2018 08:40   Dg Chest Port 1 View  Result Date: 05/25/2018 CLINICAL DATA:  Acute respiratory failure EXAM: PORTABLE CHEST 1 VIEW COMPARISON:  05/23/2018 FINDINGS: Endotracheal tube, nasogastric catheter and right jugular central line are again seen. Cardiac shadow is mildly enlarged but stable. Diffuse right-sided infiltrate is noted with some increasing consolidation in the lateral right lung base. Left retrocardiac infiltrate is noted as well. No pneumothorax is identified. IMPRESSION: Bilateral infiltrates right greater than left, increasing in the right lung base. Electronically Signed   By: Alcide Clever M.D.   On: 05/25/2018 07:24   Dg Chest Port 1 View  Result Date: 05/23/2018 CLINICAL DATA:  Suspected DVT.  Endotracheal tube. EXAM: PORTABLE  CHEST 1 VIEW COMPARISON:  One-view chest x-ray 05/23/2018 at 4:13 a.m. FINDINGS: Heart size is normal. Pacing wires are in place. NG tube courses off the inferior border of the film. The endotracheal tube has been pulled back, now terminating 4.5 cm above the carina. Right middle lobe and lower lobe airspace disease is again seen. Asymmetric edema is present on the right. IMPRESSION: 1. Interval adjustment of endotracheal tube, now in satisfactory position. 2. Right middle and lower lobe airspace disease. This is concerning for infection versus aspiration. Underlying mass is not excluded. Electronically Signed   By: Marin Roberts M.D.   On: 05/23/2018 07:28   Dg Chest Portable 1 View  Result Date: 05/23/2018 CLINICAL DATA:  Intubation EXAM: PORTABLE CHEST 1 VIEW COMPARISON:  None. FINDINGS: Endotracheal tube tip is within the proximal right mainstem bronchus. This should be retracted by 4.5 cm to place it at the level of the clavicular heads. There is a large area of right parahilar consolidation. The left lung is clear. No pleural effusion or pneumothorax. The nasogastric tube side port is below the diaphragm but not clearly visualized. A dedicated abdominal radiograph may be helpful. IMPRESSION: 1. Endotracheal tube tip in the proximal right mainstem bronchus. Retraction by 4 cm recommended. 2. Nasogastric tube side port below the diaphragm but not clearly visualized. Dedicated abdominal radiograph may be helpful. 3. Large area of right parahilar and medial right lung apex consolidation which may indicate infection or neoplastic process. Chest CT or Followup PA and lateral chest X-ray is recommended in 3-4 weeks following trial of antibiotic therapy to ensure resolution and exclude underlying malignancy. These results were called by telephone at the time of interpretation on 05/23/2018 at 5:18 am to Dr. Jaci Carrel , who verbally acknowledged these results. Electronically Signed   By: Deatra Robinson M.D.   On: 05/23/2018 05:18   Dg Abd Portable 1v  Result Date: 06/08/2018 CLINICAL DATA:  Nasogastric tube placement. EXAM: PORTABLE ABDOMEN - 1 VIEW COMPARISON:  Abdominal radiograph May 23, 2018 FINDINGS: Feeding tube tip projects in second portion of duodenum. Included bowel gas pattern is nondilated and nonobstructive. No intra-abdominal mass effect. Reticulonodular densities and alveolar airspace opacities included lung bases. Soft tissue planes and included osseous structures are non suspicious. IMPRESSION: Feeding tube tip projects in duodenum. Electronically Signed   By: Awilda Metro M.D.   On: 06/08/2018 16:09  Dg Abd Portable 1v  Result Date: 05/23/2018 CLINICAL DATA:  OG tube placement. EXAM: PORTABLE ABDOMEN - 1 VIEW COMPARISON:  One-view chest x-ray of the same day. FINDINGS: OG tube is now in place. The side port is in the mid stomach. Pacing leads are stable. Right middle and lower lobe pneumonia are stable. IMPRESSION: 1. Satisfactory positioning of OG tube in the stomach. 2. Right lower lobe pneumonia. Electronically Signed   By: Marin Roberts M.D.   On: 05/23/2018 10:44   [place] Ekg Site Rite  Result Date: 06/01/2018 If Site Rite image not attached, placement could not be confirmed due to current cardiac rhythm.    LOS: 26 days   Jeoffrey Massed, MD  [facility]  If 7PM-7AM, please contact night-coverage  Please page via [email or web address]-click on MD name and type text message  06/18/2018, 12:53 PM

## 2018-06-19 ENCOUNTER — Other Ambulatory Visit: Payer: Self-pay

## 2018-06-19 ENCOUNTER — Inpatient Hospital Stay (HOSPITAL_COMMUNITY)
Admission: RE | Admit: 2018-06-19 | Discharge: 2018-06-25 | DRG: 945 | Disposition: A | Discharge status: Home / Self Care | Payer: Medicare HMO | Source: Intra-hospital | Attending: Physical Medicine & Rehabilitation | Admitting: Physical Medicine & Rehabilitation

## 2018-06-19 ENCOUNTER — Encounter (HOSPITAL_COMMUNITY): Payer: Self-pay | Admitting: Emergency Medicine

## 2018-06-19 DIAGNOSIS — J690 Pneumonitis due to inhalation of food and vomit: Secondary | ICD-10-CM | POA: Diagnosis not present

## 2018-06-19 DIAGNOSIS — R5381 Other malaise: Secondary | ICD-10-CM | POA: Diagnosis not present

## 2018-06-19 DIAGNOSIS — R000 Tachycardia, unspecified: Secondary | ICD-10-CM | POA: Diagnosis not present

## 2018-06-19 DIAGNOSIS — G9341 Metabolic encephalopathy: Secondary | ICD-10-CM | POA: Diagnosis not present

## 2018-06-19 DIAGNOSIS — J80 Acute respiratory distress syndrome: Secondary | ICD-10-CM | POA: Diagnosis not present

## 2018-06-19 DIAGNOSIS — G92 Toxic encephalopathy: Secondary | ICD-10-CM | POA: Diagnosis not present

## 2018-06-19 DIAGNOSIS — G931 Anoxic brain damage, not elsewhere classified: Secondary | ICD-10-CM | POA: Diagnosis present

## 2018-06-19 DIAGNOSIS — T17908S Unspecified foreign body in respiratory tract, part unspecified causing other injury, sequela: Secondary | ICD-10-CM

## 2018-06-19 DIAGNOSIS — R4189 Other symptoms and signs involving cognitive functions and awareness: Secondary | ICD-10-CM | POA: Diagnosis present

## 2018-06-19 DIAGNOSIS — R1312 Dysphagia, oropharyngeal phase: Secondary | ICD-10-CM | POA: Diagnosis not present

## 2018-06-19 DIAGNOSIS — E870 Hyperosmolality and hypernatremia: Secondary | ICD-10-CM | POA: Diagnosis not present

## 2018-06-19 DIAGNOSIS — R69 Illness, unspecified: Secondary | ICD-10-CM | POA: Diagnosis not present

## 2018-06-19 DIAGNOSIS — F332 Major depressive disorder, recurrent severe without psychotic features: Secondary | ICD-10-CM | POA: Diagnosis not present

## 2018-06-19 DIAGNOSIS — T50904A Poisoning by unspecified drugs, medicaments and biological substances, undetermined, initial encounter: Secondary | ICD-10-CM | POA: Diagnosis not present

## 2018-06-19 DIAGNOSIS — J9601 Acute respiratory failure with hypoxia: Secondary | ICD-10-CM | POA: Diagnosis not present

## 2018-06-19 DIAGNOSIS — F329 Major depressive disorder, single episode, unspecified: Secondary | ICD-10-CM | POA: Diagnosis present

## 2018-06-19 DIAGNOSIS — R1310 Dysphagia, unspecified: Secondary | ICD-10-CM | POA: Diagnosis not present

## 2018-06-19 HISTORY — DX: Metabolic encephalopathy: G93.41

## 2018-06-19 MED ORDER — HEPARIN SODIUM (PORCINE) 5000 UNIT/ML IJ SOLN: 5000.0000 [IU] | Freq: Three times a day (TID) | INTRAMUSCULAR | Status: DC

## 2018-06-19 MED ORDER — FAMOTIDINE 40 MG/5ML PO SUSR
20.0000 mg | Freq: Every day | ORAL | Status: DC
  Administered 2018-06-20 – 2018-06-23 (×4): 20 mg via ORAL
  Filled 2018-06-19 (×5): qty 2.5

## 2018-06-19 MED ORDER — ACETAMINOPHEN 325 MG PO TABS
650.0000 mg | ORAL_TABLET | Freq: Four times a day (QID) | ORAL | Status: DC | PRN
  Administered 2018-06-19 – 2018-06-20 (×3): 650 mg via ORAL
  Administered 2018-06-20: 01:00:00 via ORAL
  Administered 2018-06-21: 650 mg via ORAL
  Filled 2018-06-19 (×6): qty 2

## 2018-06-19 MED ORDER — POTASSIUM CHLORIDE 20 MEQ/15ML (10%) PO SOLN
40.0000 meq | Freq: Two times a day (BID) | ORAL | Status: DC
  Administered 2018-06-20 – 2018-06-23 (×9): 40 meq
  Filled 2018-06-19 (×11): qty 30

## 2018-06-19 MED ORDER — HEPARIN SODIUM (PORCINE) 5000 UNIT/ML IJ SOLN
5000.0000 [IU] | Freq: Three times a day (TID) | INTRAMUSCULAR | Status: DC
  Administered 2018-06-19 – 2018-06-20 (×3): 5000 [IU] via SUBCUTANEOUS
  Filled 2018-06-19 (×3): qty 1

## 2018-06-19 MED ORDER — SENNOSIDES 8.8 MG/5ML PO SYRP
5.0000 mL | ORAL_SOLUTION | Freq: Every day | ORAL | Status: DC | PRN
  Filled 2018-06-19: qty 5

## 2018-06-19 MED ORDER — ALBUTEROL SULFATE (2.5 MG/3ML) 0.083% IN NEBU: 2.5000 mg | INHALATION_SOLUTION | RESPIRATORY_TRACT | Status: DC | PRN

## 2018-06-19 MED ORDER — FOLIC ACID 1 MG PO TABS
1.0000 mg | ORAL_TABLET | Freq: Every day | ORAL | Status: DC
  Administered 2018-06-20 – 2018-06-25 (×6): 1 mg
  Filled 2018-06-19 (×6): qty 1

## 2018-06-19 MED ORDER — BISACODYL 10 MG RE SUPP: 10.0000 mg | Freq: Every day | RECTAL | Status: DC | PRN

## 2018-06-19 MED ORDER — VITAMIN B-1 100 MG PO TABS
100.0000 mg | ORAL_TABLET | Freq: Every day | ORAL | Status: DC
  Administered 2018-06-20 – 2018-06-25 (×6): 100 mg
  Filled 2018-06-19 (×6): qty 1

## 2018-06-19 MED ORDER — ESCITALOPRAM OXALATE 10 MG PO TABS
20.0000 mg | ORAL_TABLET | Freq: Every day | ORAL | Status: DC
  Administered 2018-06-20 – 2018-06-25 (×6): 20 mg via ORAL
  Filled 2018-06-19 (×6): qty 2

## 2018-06-19 MED ORDER — POLYETHYLENE GLYCOL 3350 17 G PO PACK: 17.0000 g | PACK | Freq: Every day | ORAL | Status: DC | PRN

## 2018-06-19 MED ORDER — SODIUM CHLORIDE 0.45 % IV SOLN
INTRAVENOUS | Status: DC
  Administered 2018-06-20 – 2018-06-23 (×5): via INTRAVENOUS

## 2018-06-19 MED ORDER — CLONAZEPAM 0.5 MG PO TABS
0.5000 mg | ORAL_TABLET | Freq: Two times a day (BID) | ORAL | Status: DC | PRN
  Administered 2018-06-21 – 2018-06-24 (×5): 0.5 mg
  Filled 2018-06-19 (×5): qty 1

## 2018-06-19 MED ORDER — LIP MEDEX EX OINT
TOPICAL_OINTMENT | CUTANEOUS | Status: DC | PRN
  Filled 2018-06-19: qty 7

## 2018-06-19 MED ORDER — STARCH (THICKENING) PO POWD
ORAL | Status: DC | PRN
  Filled 2018-06-19: qty 227

## 2018-06-19 NOTE — Evaluation (Signed)
Speech Language Pathology Assessment and Plan  Patient Details  Name: Courtney Beard MRN: [id number] Date of Birth: 12/16/1950  SLP Diagnosis: Voice disorder;Cognitive Impairments;Dysphagia  Rehab Potential: Excellent ELOS: 10-14 days     Today's Date: 06/19/2018 SLP Individual Time: 6295-2841 SLP Individual Time Calculation (min): 35 min   Problem List:  Patient Active Problem List   Diagnosis Date Noted  . Metabolic encephalopathy 32/44/0102  . Depression   . Alcohol abuse   . Leukocytosis   . Acute blood loss anemia   . MDD (major depressive disorder), recurrent severe, without psychosis ([place])   . Overdose 05/23/2018  . Acute hypoxemic respiratory failure ([place])   . Aspiration pneumonia of right lung due to gastric secretions [facility])    Past Medical History:  Past Medical History:  Diagnosis Date  . Acute blood loss anemia 06/2018  . Anemia   . Depression   . H/O ETOH abuse    Past Surgical History:  Past Surgical History:  Procedure Laterality Date  . COLONOSCOPY      Assessment / Plan / Recommendation Clinical Impression Patient is a 67 year old right-handed female with history of depression, alcohol use.  Presented 05/23/2018 after being found unresponsive with empty bottles of Klonopin and Zanaflex at bedside. CPR was initiated. Blood pressure 80/40. Patient required intubation. Cranial CT scan reviewed, unremarkable for acute intracranial process. Alcohol level63, ammonia level36, troponin level negative, lactic acid 3.21, urine drug screen positive benzos, CK of 508. Echocardiogram with ejection fraction of 65% no wall motion abnormalities. Patient was extubated 06/08/2018. Hospital course septic shock protocol with acute hypoxic respiratory failure. Subcutaneous heparin for DVT prophylaxis. Nasogastric tube in place for nutritional supportand diet advanced to a dysphagia #1 Honey liquid. Psychiatry follow-up awaiting plan for possible inpatient psychiatric  hospitalization. Cortrak removed on 06/19/18. Patient admitted to [facility] 06/19/18.  Patient demonstrates moderate-severe cognitive impairments impacting initiation, sustained attention, functional problem solving, recall of daily information and awareness which impacts her safety with functional and familiar tasks. Patient's function is also impacted by fatigue and poor activity tolerance. Patient demonstrated a hoarse and wet vocal quality with a low vocal intensity. However, patient was ~90% intelligible at the sentence level. Patient consumed minimal amounts of honey-thick liquids via cup and demonstrated what appeared to be a delayed swallow initiation without overt s/s of aspiration. However, patient with baseline wet vocal quality which makes it difficult to differentiate. Patient also consumed Dys. 3 textures with mildly prolonged mastication and decreased awareness of bolus due to fatigue but without overt s/s of aspiration. Due to patient being afebrile with clear lung sounds and increased arousal and improved vocal quality per notes from acute, recommend patient continue current diet with strict aspiration precautions and full supervision for safety and encouragement with PO intake. Patient would benefit from skilled SLP intervention to maximize her swallowing and cognitive functioning and overall functional independence prior to discharge home.     Skilled Therapeutic Interventions          Administered a BSE and cognitive-linguistic evaluation, please see above for details.   SLP Assessment  Patient will need skilled Speech Lanaguage Pathology Services during CIR admission    Recommendations  SLP Diet Recommendations: Dysphagia 3 (Mech soft);Honey Liquid Administration via: Cup Medication Administration: Crushed with puree Supervision: Full supervision/cueing for compensatory strategies;Patient able to self feed Compensations: Minimize environmental distractions;Slow rate;Small  sips/bites Postural Changes and/or Swallow Maneuvers: Seated upright 90 degrees Oral Care Recommendations: Oral care BID Recommendations for Other Services: Neuropsych consult  Patient destination: Home Follow up Recommendations: 24 hour supervision/assistance;Home Health SLP;Outpatient SLP Equipment Recommended: To be determined    SLP Frequency 3 to 5 out of 7 days   SLP Duration  SLP Intensity  SLP Treatment/Interventions 10-14 days   Minumum of 1-2 x/day, 30 to 90 minutes  Cognitive remediation/compensation;Dysphagia/aspiration precaution training;Internal/external aids;Speech/Language facilitation;Therapeutic Activities;Environmental controls;Cueing hierarchy;Functional tasks;Patient/family education    Pain Reported headache, RN aware. Patient unable to rate.   Short Term Goals: Week 1: SLP Short Term Goal 1 (Week 1): Patient will consume current diet with minimal overt s/s of aspiration with Min A verbal cues for use of swallowing compensatory strategies.  SLP Short Term Goal 2 (Week 1): Patient will consume trials of thin liquids with minimal overt s/s of aspiration over 2 sessions to assess readiness for repeat objective swallow assessment.  SLP Short Term Goal 3 (Week 1): Patient will demonstrate selective attention to tasks for ~10 minutes in a mildly distracting enviornment with Mod A verbal cues for redirection.  SLP Short Term Goal 4 (Week 1): Patient will demonstrate functional problem solving for basic and familira tasks with Mod A verbal cues.  SLP Short Term Goal 5 (Week 1): Patient will utilize external aids to recall new, daily information with Mod A verbal cues.   Refer to Care Plan for Long Term Goals  Recommendations for other services: Neuropsych  Discharge Criteria: Patient will be discharged from SLP if patient refuses treatment 3 consecutive times without medical reason, if treatment goals not met, if there is a change in medical status, if patient makes no  progress towards goals or if patient is discharged from hospital.  The above assessment, treatment plan, treatment alternatives and goals were discussed and mutually agreed upon: by patient and by family  Shedric Fredericks 06/19/2018, 3:12 PM

## 2018-06-19 NOTE — Progress Notes (Signed)
Inpatient Rehabilitation Admissions Coordinator  I met with patient and her son, Jonathan at bedside with safety sitter and then spoke with her husband by phone. All are in agreement to admit to inpt rehab today. I have notified Dr. Ghimire, RN CM , SW and RN. I will make the arrangements to admit today.   , RN, MSN Rehab Admissions Coordinator [phone] 06/19/2018 11:28 AM  

## 2018-06-19 NOTE — Progress Notes (Signed)
Writer received report

## 2018-06-19 NOTE — Progress Notes (Signed)
Staff repeatedly repositioning patient to move patient up in the bed and HOB at 30 degrees. Patient repeatedly moving down in the bed and flexing body to bring head less than 30 degrees. Patient repeatedly educated about risk of aspiration with continuous TF and importance of keeping HOB at least 30 degrees. NT in room with patient and aware of importance of patient having head elevated at least 30 degrees. Patient irritable and not completely cooperative with assessment and med administration. Patient repeatedly stating to leave her alone, let her sleep, and turn off the light. This RN repeatedly informed her of need for quick  assessment and med administration, and then patient will be left to sleep. Will continue to monitor.

## 2018-06-19 NOTE — Progress Notes (Signed)
OT Cancellation Note  Patient Details Name: [name] MRN: [id number] DOB: 1951-02-27   Cancelled Treatment:    Reason Eval/Treat Not Completed: Pain limiting ability to participate.  Pt. C/o severe headache and requests skilled therapy session be postponed later until after headache has subsided.  Will check back as able, note CIR admission likely later today.   Robet Leu, COTA/L 06/19/2018, 11:08 AM

## 2018-06-19 NOTE — Plan of Care (Signed)
  Problem: Consults Goal: RH GENERAL PATIENT EDUCATION Description See Patient Education module for education specifics. Outcome: Not Progressing Goal: Skin Care Protocol Initiated - if Braden Score 18 or less Description If consults are not indicated, leave blank or document N/A Outcome: Not Progressing Goal: Nutrition Consult-if indicated Outcome: Not Progressing   Problem: RH BOWEL ELIMINATION Goal: RH STG MANAGE BOWEL WITH ASSISTANCE Description STG Manage Bowel with min Assistance.  Outcome: Not Progressing Goal: RH STG MANAGE BOWEL W/MEDICATION W/ASSISTANCE Description STG Manage Bowel with Medication with min Assistance.  Outcome: Not Progressing   Problem: RH BLADDER ELIMINATION Goal: RH STG MANAGE BLADDER WITH ASSISTANCE Description STG Manage Bladder With min Assistance  Outcome: Not Progressing   Problem: RH SKIN INTEGRITY Goal: RH STG SKIN FREE OF INFECTION/BREAKDOWN Outcome: Not Progressing   Problem: RH SAFETY Goal: RH STG ADHERE TO SAFETY PRECAUTIONS W/ASSISTANCE/DEVICE Description STG Adhere to Safety Precautions With min  Assistance/Device.  Outcome: Not Progressing Goal: RH STG DECREASED RISK OF FALL WITH ASSISTANCE Description STG Decreased Risk of Fall With min Assistance.  Outcome: Not Progressing   Problem: RH PAIN MANAGEMENT Goal: RH STG PAIN MANAGED AT OR BELOW PT'S PAIN GOAL Outcome: Not Progressing   Problem: RH KNOWLEDGE DEFICIT GENERAL Goal: RH STG INCREASE KNOWLEDGE OF SELF CARE AFTER HOSPITALIZATION Outcome: Not Progressing   Problem: RH Vision Goal: RH LTG Vision (Specify) Outcome: Not Progressing  New admit,

## 2018-06-19 NOTE — Progress Notes (Signed)
A/Ox4, Denies SI/HI/AVH. Pt notes to be impulsive, will order tele-sitter. Pt resides with her spouse. Son is at bedside. She is able to make needs known. Cont B/B, LBM 06/19/18. Ambulates stand by assist. No skin issues, skin intact.

## 2018-06-19 NOTE — Discharge Summary (Signed)
PATIENT DETAILS Name: Courtney Beard Age: 67 y.o. Sex: female Date of Birth: January 31, 1951 MRN: [id number]. Admitting Physician: Reyes Ivan, MD [id number], Sonny Masters, MD  Admit Date: 05/23/2018 Discharge date: 06/19/2018  Recommendations for Follow-up:  1. Follow up with PCP in 1-2 weeks 2. Please obtain BMP/CBC in one week 3. Please obtain psych evaluation before discharge from CIR.  Admitted From:  Home  Disposition: CIR    Home Health: No  Equipment/Devices: None  Discharge Condition: Stable  CODE STATUS: FULL CODE  Diet recommendation:  Heart Healthy   Brief Summary: See H&P, Labs, Consult and Test reports for all details in brief, Patient is a 67 y.o. female with history of EtOH use, anxiety/depression presented with a cardiac arrest in the setting of overdose and aspiration pneumonia resulting in ARDS and prolonged ventilator dependence.  Per H&P, patient's husband found the patient on day of admission unresponsive-covered in emesis with no palpable pulse- with empty bottles of Klonopin and Zanaflex.  Patient was managed in the ICU, was on broad-spectrum antimicrobial therapy-finally extubated on 10/28.  Further hospital course has been complicated by delirium, dysphagia requiring Cortak tube placement.  Mental status slowly improving with supportive care-remains n.p.o with NG feedings in place--see below for further details.  Brief Hospital Course: Septic shock with acute hypoxic respiratory failure with ARDS secondary to aspiration pneumonia/HCAP: Sepsis pathophysiology has improved-required prolonged ventilator dependence-extubated on 10/28.  Has already completed a course of antimicrobial therapy.  Although Candida seen in sputum culture, this is thought to be a colonization/contamination rather than a true infection.  Continue supportive care-incentive spirometry/flutter valve.  Continue to titrate off oxygen as tolerated-lungs are clear this  morning.  Acute toxic/metabolic encephalopathy: Multifactorial-secondary to hypoxia/sedatives/narcotics-she is completely awake and alert-encephalopathy seems to have improved.  Depakote/Seroquel have been discontinued-restarted on Lexapro.  Minimize benzos as much as possible.  Sinus tachycardia: Secondary to acute illness/numerous physiologic stressors/anemia.  Much improved-remains slightly tachycardic in the low 100s  Hypernatremia: Resolved.  Secondary to free water deficit-sodium levels have normalized with free water flushes via NG tube.  Follow electrolytes periodically.   Hypokalemia: Repleted-recheck periodically.  Dysphagia: Related to prolonged ventilator dependent/ET tube placement-voice quality has improved.  Speech therapy continues to follow-have been upgraded to a dysphagia 3 diet.  NG tube discontinued on 11/8.   Anemia: Secondary to critical illness-no overt evidence of blood loss.  Hemoglobin slowly improving-anemia panel not consistent with iron deficiency.    Thrombocytosis: Likely reactive-no indication for iron deficiency on lab data-improving slowly with improvement in her underlying clinical issues.  Follow CBC periodically.   Suspected intentional drug overdose: Status is rapidly improving-when asked if she remembers taking Zanaflex and Klonopin-she claims not to remember.  Mental status has improved-seen by psychiatry on 11/4-recommendations are to transfer to inpatient psych when she is medically stable-she is being discharged to CIR-sitter remains in place.  CIR will consult psychiatry when she is more stable.   Deconditioning/debility: Secondary to critical illness-appreciate PT eval-CIR following to see if she can come to CIR given significant debility/deconditioning and ongoing issues with dysphagia.   Procedures/Studies: ETT 10/12>>10/28 PICC 10/21  Discharge Diagnoses:  Principal Problem:   MDD (major depressive disorder), recurrent severe,  without psychosis (HCC) Active Problems:   Overdose   Acute hypoxemic respiratory failure (HCC)   Aspiration pneumonia of right lung due to gastric secretions (HCC)   Depression   Alcohol abuse   Leukocytosis   Acute blood loss anemia   Discharge Instructions:  Activity:  As tolerated with Full fall precautions use walker/cane & assistance as needed  Discharge Instructions    Diet general   Complete by:  As directed    Dysphagia 3 diet   Discharge instructions   Complete by:  As directed    Maintain 1:1 sitter until cleared by psychiatry while at CIR   Increase activity slowly   Complete by:  As directed      Allergies as of 06/19/2018      Reactions   Erythromycin Itching      Medication List    STOP taking these medications   Collagen 500 MG Caps   GRAPE SEED PO     TAKE these medications   clonazePAM 0.5 MG tablet Commonly known as:  KLONOPIN Take 0.5 mg by mouth 2 (two) times daily as needed for anxiety.   cyclobenzaprine 10 MG tablet Commonly known as:  FLEXERIL Take 10 mg by mouth 3 (three) times daily as needed for muscle spasms.   escitalopram 20 MG tablet Commonly known as:  LEXAPRO Take 20 mg by mouth daily.   FISH OIL MAXIMUM STRENGTH 1200 MG Cpdr Take 1,200 capsules by mouth 2 (two) times daily.   heparin 5000 UNIT/ML injection Inject 1 mL (5,000 Units total) into the skin every 8 (eight) hours.   MULTI-DAY PLUS MINERALS PO Take 1 tablet by mouth daily.      Follow-up Information    Merri Brunette, MD. Schedule an appointment as soon as possible for a visit in 1 week(s).   Specialty:  Family Medicine Contact information: [phone] [address] [place] [address] [phone]          Allergies  Allergen Reactions  . Erythromycin Itching   Consultations:   pulmonary/intensive care, psychiatry and CIR  Other Procedures/Studies: Dg Chest 1 View  Result Date: 05/23/2018 CLINICAL DATA:  Aspiration pneumonia. Acute  hypoxemic respiratory failure. Central line placement. EXAM: CHEST  1 VIEW COMPARISON:  Prior today FINDINGS: A new right jugular central venous catheter is seen with tip overlying the distal SVC. Endotracheal tube and nasogastric tube remain in appropriate position. No pneumothorax visualized. Worsening airspace disease is seen throughout the right lung. Mild airspace disease in the medial left lower lung also shows mild worsening. IMPRESSION: New right jugular central venous catheter in appropriate position. No pneumothorax visualized. Interval worsening of airspace disease throughout the right lung and in the medial left lower lobe. Electronically Signed   By: Myles Rosenthal M.D.   On: 05/23/2018 23:07   Ct Head Wo Contrast  Result Date: 05/23/2018 CLINICAL DATA:  Pulseless arrest, status post CPR. EXAM: CT HEAD WITHOUT CONTRAST CT CERVICAL SPINE WITHOUT CONTRAST TECHNIQUE: Multidetector CT imaging of the head and cervical spine was performed following the standard protocol without intravenous contrast. Multiplanar CT image reconstructions of the cervical spine were also generated. COMPARISON:  None. FINDINGS: CT HEAD FINDINGS Brain: There is no mass, hemorrhage or extra-axial collection. The size and configuration of the ventricles and extra-axial CSF spaces are normal. There is no acute or chronic infarction. The brain parenchyma is normal. Vascular: No abnormal hyperdensity of the major intracranial arteries or dural venous sinuses. No intracranial atherosclerosis. Skull: The visualized skull base, calvarium and extracranial soft tissues are normal. Sinuses/Orbits: Fluid layering in the maxillary and sphenoid sinuses. The orbits are normal. CT CERVICAL SPINE FINDINGS Alignment: There is grade 1 anterolisthesis at C3-4, C4-5 and C5-6 secondary to facet hypertrophy. There is normal facet alignment. Lateral masses of C1  and C2 and the occipital condyles are aligned. Skull base and vertebrae: No acute fracture.  Soft tissues and spinal canal: No prevertebral fluid or swelling. No visible canal hematoma. Disc levels: No advanced spinal canal or neural foraminal stenosis. Upper chest: Large area of consolidation in the right upper lobe. Other: Patient is intubated with the endotracheal tube tip beyond the field of view. IMPRESSION: 1. No acute intracranial abnormality. 2. No acute fracture of the cervical spine. 3. Large area of right upper lobe consolidation, possibly secondary to aspiration. Electronically Signed   By: Deatra Robinson M.D.   On: 05/23/2018 06:34   Ct Cervical Spine Wo Contrast  Result Date: 05/23/2018 CLINICAL DATA:  Pulseless arrest, status post CPR. EXAM: CT HEAD WITHOUT CONTRAST CT CERVICAL SPINE WITHOUT CONTRAST TECHNIQUE: Multidetector CT imaging of the head and cervical spine was performed following the standard protocol without intravenous contrast. Multiplanar CT image reconstructions of the cervical spine were also generated. COMPARISON:  None. FINDINGS: CT HEAD FINDINGS Brain: There is no mass, hemorrhage or extra-axial collection. The size and configuration of the ventricles and extra-axial CSF spaces are normal. There is no acute or chronic infarction. The brain parenchyma is normal. Vascular: No abnormal hyperdensity of the major intracranial arteries or dural venous sinuses. No intracranial atherosclerosis. Skull: The visualized skull base, calvarium and extracranial soft tissues are normal. Sinuses/Orbits: Fluid layering in the maxillary and sphenoid sinuses. The orbits are normal. CT CERVICAL SPINE FINDINGS Alignment: There is grade 1 anterolisthesis at C3-4, C4-5 and C5-6 secondary to facet hypertrophy. There is normal facet alignment. Lateral masses of C1 and C2 and the occipital condyles are aligned. Skull base and vertebrae: No acute fracture. Soft tissues and spinal canal: No prevertebral fluid or swelling. No visible canal hematoma. Disc levels: No advanced spinal canal or neural  foraminal stenosis. Upper chest: Large area of consolidation in the right upper lobe. Other: Patient is intubated with the endotracheal tube tip beyond the field of view. IMPRESSION: 1. No acute intracranial abnormality. 2. No acute fracture of the cervical spine. 3. Large area of right upper lobe consolidation, possibly secondary to aspiration. Electronically Signed   By: Deatra Robinson M.D.   On: 05/23/2018 06:34   Dg Chest Port 1 View  Result Date: 06/10/2018 CLINICAL DATA:  Fall pneumonia EXAM: PORTABLE CHEST 1 VIEW COMPARISON:  06/08/2018 FINDINGS: Right-sided PICC line is again noted and stable. The endotracheal tube is been removed in the interval. The nasogastric catheter has been exchanged for a feeding tube. Patchy infiltrates are again noted bilaterally right greater than left. No bony abnormality is noted. IMPRESSION: Stable patchy infiltrates. Electronically Signed   By: Alcide Clever M.D.   On: 06/10/2018 10:55   Dg Chest Port 1 View  Result Date: 06/08/2018 CLINICAL DATA:  Acute respiratory failure with hypoxemia EXAM: PORTABLE CHEST 1 VIEW COMPARISON:  06/07/2018 FINDINGS: Cardiac shadow is stable. Endotracheal tube, nasogastric catheter and right-sided PICC line are again seen and stable. Patchy infiltrates are again identified throughout both lungs right greater than left. No sizable effusion is seen. No bony abnormality is noted. IMPRESSION: Stable patchy infiltrates bilaterally right greater than left. Tubes and lines as described. Electronically Signed   By: Alcide Clever M.D.   On: 06/08/2018 07:24   Dg Chest Port 1 View  Result Date: 06/07/2018 CLINICAL DATA:  Acute respiratory failure with hypoxemia EXAM: PORTABLE CHEST 1 VIEW COMPARISON:  06/06/2018 FINDINGS: Multifocal patchy opacities, right lung predominant, grossly unchanged. No pleural effusion or pneumothorax.  Endotracheal tube terminates 8 cm above the carina. Right arm PICC terminates at the cavoatrial junction. Enteric  tube courses into the stomach. IMPRESSION: Multifocal pneumonia, right lobe predominant, unchanged. Endotracheal tube terminates 8 cm above the carina. Additional support apparatus as above. Electronically Signed   By: Charline Bills M.D.   On: 06/07/2018 08:36   Dg Chest Port 1 View  Result Date: 06/06/2018 CLINICAL DATA:  Acute respiratory failure.  Hypoxemia. EXAM: PORTABLE CHEST 1 VIEW COMPARISON:  June 05, 2018 FINDINGS: The ETT and right PICC line are stable. The NG tube terminates below today's film. Right greater than left patchy pulmonary infiltrates are similar in the interval. No other interval changes. IMPRESSION: Multifocal pneumonia remains, similar in the interval. Stable support apparatus. Electronically Signed   By: Gerome Sam III M.D   On: 06/06/2018 08:42   Dg Chest Port 1 View  Result Date: 06/05/2018 CLINICAL DATA:  Respiratory fail, aspiration pneumonia EXAM: PORTABLE CHEST 1 VIEW COMPARISON:  Portable exam 0429 hours compared to 06/04/2018 FINDINGS: Tip of endotracheal tube projects 4.3 cm above carina. Nasogastric tube extends into stomach. RIGHT arm PICC line tip projects over SVC. Normal heart size mediastinal contours. Patchy BILATERAL airspace infiltrates consistent with multifocal pneumonia, RIGHT greater than LEFT. Infiltrates appear mildly improved versus previous exam. No pleural effusion or pneumothorax. IMPRESSION: Multifocal pneumonia, with mildly improved aeration versus previous exam. Electronically Signed   By: Ulyses Southward M.D.   On: 06/05/2018 10:10   Dg Chest Port 1 View  Result Date: 06/04/2018 CLINICAL DATA:  67 year old with respiratory failure. EXAM: PORTABLE CHEST 1 VIEW COMPARISON:  06/03/2018 FINDINGS: Endotracheal tube is 3.3 cm above the carina. Nasogastric tube extends into the abdomen. PICC line tip in the lower SVC region. Patchy parenchymal disease and airspace disease in the right lung has slightly improved, particularly at the right  lung base. Probable right pleural effusion. However, there are new or worsening patchy airspace densities along the periphery of the left lung. Heart size is within normal limits and stable. Negative for a pneumothorax. IMPRESSION: 1. New or worsening airspace disease in left lung. Findings are concerning for left lung pneumonia. 2. Persistent patchy airspace disease in the right lung is most compatible with pneumonia with slightly improved aeration at the right lung base. 3. Probable small pleural effusions. 4. Visualized support apparatuses are appropriately positioned. Electronically Signed   By: Richarda Overlie M.D.   On: 06/04/2018 10:13   Dg Chest Port 1 View  Result Date: 06/03/2018 CLINICAL DATA:  Acute respiratory failure with hypoxemia EXAM: PORTABLE CHEST 1 VIEW COMPARISON:  06/02/2018 FINDINGS: The patient is rotated to the right on today's radiograph, reducing diagnostic sensitivity and specificity. Endotracheal tube tip 2.8 cm above the carina, satisfactorily position. A nasogastric tube extends below the inferior margin of the radiograph. Right PICC line tip: Lower SVC. Continued extensive airspace opacity in the right lung, worsened at the right lung base compared to prior. There is some mild bandlike density in the left mid lung, increased in conspicuity. Heart size within normal limits. IMPRESSION: 1. Worsened airspace opacity at the right lung base with continued notable right upper lobe airspace opacity. 2. Mild increase in bandlike density in the left mid lung, probably atelectasis. 3. Support apparatus satisfactorily position. Electronically Signed   By: Gaylyn Rong M.D.   On: 06/03/2018 10:28   Dg Chest Port 1 View  Result Date: 06/02/2018 CLINICAL DATA:  Aspiration into the airway. EXAM: PORTABLE CHEST 1 VIEW COMPARISON:  Most recent comparison yesterday at 0507 hour FINDINGS: Patient is significantly rotated. Endotracheal tube approximately 15 mm from the carina. Enteric tube in  place with tip and side-port below the diaphragm. Right internal jugular central venous catheter in the mid SVC. Right upper extremity PICC with tip in the distal SVC. Patchy opacities throughout the right lung, most confluent in the lower lobe without significant interval change allowing for differences in technique. Slight improvement in left lung base opacity. Heart size and mediastinal contours are grossly unchanged, partially obscured by rotation. No pneumothorax. IMPRESSION: 1. No significant change in patchy opacities throughout the right lung allowing for differences in technique and rotation. 2. New right upper extremity PICC with tip in the distal SVC. Borderline low positioning of endotracheal tube tip 15 mm from the carina. 3. Slight improving left basilar aeration. Electronically Signed   By: Narda Rutherford M.D.   On: 06/02/2018 02:36   Dg Chest Port 1 View  Result Date: 06/01/2018 CLINICAL DATA:  67 year old female with pneumonia. Subsequent encounter. EXAM: PORTABLE CHEST 1 VIEW COMPARISON:  05/30/2018 chest x-ray. FINDINGS: Endotracheal tube tip 3.9 cm above the carina. Right central line tip mid superior vena cava level. No pneumothorax detected. Nasogastric tube courses below the diaphragm. Tip is not included on the present exam. Minimal improvement of consolidation right lower lobe and patchy consolidation right upper lobe which may represent infectious infiltrate in proper clinical setting. Asymmetric pulmonary vascular congestion/mild pulmonary edema. Heart size within normal limits. IMPRESSION: 1. Minimal improvement of right lower lobe consolidation and patchy consolidation right upper lobe suggestive of result of pneumonia. 2. Asymmetric mild pulmonary vascular congestion/pulmonary edema without change. Electronically Signed   By: Lacy Duverney M.D.   On: 06/01/2018 07:22   Dg Chest Port 1 View  Result Date: 05/30/2018 CLINICAL DATA:  Aspiration pneumonia. EXAM: PORTABLE CHEST 1  VIEW COMPARISON:  One-view chest x-ray 05/29/2018 FINDINGS: Heart size is normal. Endotracheal tube terminates 3 cm above the carina. NG tube is in the stomach. Right IJ line is stable. Right lower lobe airspace disease is again seen. Mild edema is slightly increased. Minimal left basilar atelectasis is stable. IMPRESSION: 1. Persistent right lower lobe airspace disease compatible with pneumonia or aspiration. 2. Slight progression in edema and left basilar atelectasis. Electronically Signed   By: Marin Roberts M.D.   On: 05/30/2018 07:42   Dg Chest Port 1 View  Result Date: 05/29/2018 CLINICAL DATA:  Respiratory failure EXAM: PORTABLE CHEST 1 VIEW COMPARISON:  05/28/2018 FINDINGS: Cardiac shadow is stable. Right jugular central line, endotracheal tube and nasogastric catheter are again seen and stable. The left lung is predominantly clear although some left retrocardiac infiltrate is seen. Diffuse infiltrates are noted throughout the right lung stable in appearance when compared with the prior exam. Small right pleural effusion is noted as well. IMPRESSION: Stable bilateral infiltrates right greater than left with associated right effusion. Tubes and lines as described. Electronically Signed   By: Alcide Clever M.D.   On: 05/29/2018 07:07   Dg Chest Port 1 View  Result Date: 05/28/2018 CLINICAL DATA:  Pneumonia. EXAM: PORTABLE CHEST 1 VIEW COMPARISON:  05/28/2018. FINDINGS: Endotracheal tube, NG tube, right IJ line in stable position. Heart size stable. Diffuse bilateral pulmonary infiltrates, particular on the right side, and right-sided pleural effusion again noted. Similar findings noted on prior exam. IMPRESSION: 1.  Lines and tubes stable position. 2. Diffuse bilateral pulmonary infiltrates, right side greater than left. Right-sided pleural effusion. Similar findings on prior  exam. Electronically Signed   By: Maisie Fus  Register   On: 05/28/2018 11:47   Dg Chest Portable 1 View  Result Date:  05/28/2018 CLINICAL DATA:  ARDS, respiratory failure and aspiration pneumonia. EXAM: PORTABLE CHEST 1 VIEW COMPARISON:  05/27/2018 FINDINGS: Endotracheal tube remains with the tip projecting approximately 4 cm above the carina. Right jugular central line shows stable positioning with the tip in the lower SVC. Gastric decompression tube extends into the stomach. Relatively stable airspace consolidation bilaterally, right greater than left with potentially mildly improved aeration at the right base. No significant pleural effusions. No pneumothorax. Stable heart size. IMPRESSION: Relatively stable bilateral airspace consolidation with potentially slightly improved aeration at the right base. Electronically Signed   By: Irish Lack M.D.   On: 05/28/2018 07:53   Dg Chest Port 1 View  Result Date: 05/27/2018 CLINICAL DATA:  Shortness of breath, ARDS EXAM: PORTABLE CHEST 1 VIEW COMPARISON:  Portable chest x-ray of 05/26/2018 FINDINGS: There is slightly better aeration of the right upper lung. There appear to be bilateral pleural effusions. Opacity at the right lung base remains most likely due to pneumonia. The tip of the endotracheal tube is approximatelymm 2.0 cm above the carina. NG tube extends into the stomach. IMPRESSION: 1. Tip of endotracheal tube 2.0 cm above the carina. 2. Slightly.  Aeration of the right upper lung. 3. Bilateral pleural effusions and basilar atelectasis. Cannot exclude pneumonia particularly the right lung base. Electronically Signed   By: Dwyane Dee M.D.   On: 05/27/2018 10:08   Dg Chest Port 1 View  Result Date: 05/26/2018 CLINICAL DATA:  Acute respiratory failure EXAM: PORTABLE CHEST 1 VIEW COMPARISON:  Portable chest x-ray of May 25, 2018 FINDINGS: There is developed further opacification of much of the right lung. The greatest density is inferiorly. On the left there is increasing interstitial and alveolar opacity in the mid and lower lung. The heart borders are  partially obscured. The heart is top-normal in size. The pulmonary vascularity is not clearly engorged. The endotracheal tube tip is at the carina. The esophagogastric tube tip and proximal port project below the GE junction. The right internal jugular venous catheter tip projects over the midportion of the SVC. IMPRESSION: Progressive opacification of both lungs worrisome for widespread pneumonia. Low positioning of the endotracheal tube. Withdrawal by 2 cm would help assure that the tube does not migrate into the right mainstem bronchus with patient movement. Electronically Signed   By: David  [place] M.D.   On: 05/26/2018 08:40   Dg Chest Port 1 View  Result Date: 05/25/2018 CLINICAL DATA:  Acute respiratory failure EXAM: PORTABLE CHEST 1 VIEW COMPARISON:  05/23/2018 FINDINGS: Endotracheal tube, nasogastric catheter and right jugular central line are again seen. Cardiac shadow is mildly enlarged but stable. Diffuse right-sided infiltrate is noted with some increasing consolidation in the lateral right lung base. Left retrocardiac infiltrate is noted as well. No pneumothorax is identified. IMPRESSION: Bilateral infiltrates right greater than left, increasing in the right lung base. Electronically Signed   By: Alcide Clever M.D.   On: 05/25/2018 07:24   Dg Chest Port 1 View  Result Date: 05/23/2018 CLINICAL DATA:  Suspected DVT.  Endotracheal tube. EXAM: PORTABLE CHEST 1 VIEW COMPARISON:  One-view chest x-ray 05/23/2018 at 4:13 a.m. FINDINGS: Heart size is normal. Pacing wires are in place. NG tube courses off the inferior border of the film. The endotracheal tube has been pulled back, now terminating 4.5 cm above the carina. Right middle lobe and  lower lobe airspace disease is again seen. Asymmetric edema is present on the right. IMPRESSION: 1. Interval adjustment of endotracheal tube, now in satisfactory position. 2. Right middle and lower lobe airspace disease. This is concerning for infection versus  aspiration. Underlying mass is not excluded. Electronically Signed   By: Marin Roberts M.D.   On: 05/23/2018 07:28   Dg Chest Portable 1 View  Result Date: 05/23/2018 CLINICAL DATA:  Intubation EXAM: PORTABLE CHEST 1 VIEW COMPARISON:  None. FINDINGS: Endotracheal tube tip is within the proximal right mainstem bronchus. This should be retracted by 4.5 cm to place it at the level of the clavicular heads. There is a large area of right parahilar consolidation. The left lung is clear. No pleural effusion or pneumothorax. The nasogastric tube side port is below the diaphragm but not clearly visualized. A dedicated abdominal radiograph may be helpful. IMPRESSION: 1. Endotracheal tube tip in the proximal right mainstem bronchus. Retraction by 4 cm recommended. 2. Nasogastric tube side port below the diaphragm but not clearly visualized. Dedicated abdominal radiograph may be helpful. 3. Large area of right parahilar and medial right lung apex consolidation which may indicate infection or neoplastic process. Chest CT or Followup PA and lateral chest X-ray is recommended in 3-4 weeks following trial of antibiotic therapy to ensure resolution and exclude underlying malignancy. These results were called by telephone at the time of interpretation on 05/23/2018 at 5:18 am to Dr. Jaci Carrel , who verbally acknowledged these results. Electronically Signed   By: Deatra Robinson M.D.   On: 05/23/2018 05:18   Dg Abd Portable 1v  Result Date: 06/08/2018 CLINICAL DATA:  Nasogastric tube placement. EXAM: PORTABLE ABDOMEN - 1 VIEW COMPARISON:  Abdominal radiograph May 23, 2018 FINDINGS: Feeding tube tip projects in second portion of duodenum. Included bowel gas pattern is nondilated and nonobstructive. No intra-abdominal mass effect. Reticulonodular densities and alveolar airspace opacities included lung bases. Soft tissue planes and included osseous structures are non suspicious. IMPRESSION: Feeding tube tip  projects in duodenum. Electronically Signed   By: Awilda Metro M.D.   On: 06/08/2018 16:09   Dg Abd Portable 1v  Result Date: 05/23/2018 CLINICAL DATA:  OG tube placement. EXAM: PORTABLE ABDOMEN - 1 VIEW COMPARISON:  One-view chest x-ray of the same day. FINDINGS: OG tube is now in place. The side port is in the mid stomach. Pacing leads are stable. Right middle and lower lobe pneumonia are stable. IMPRESSION: 1. Satisfactory positioning of OG tube in the stomach. 2. Right lower lobe pneumonia. Electronically Signed   By: Marin Roberts M.D.   On: 05/23/2018 10:44   [place] Ekg Site Rite  Result Date: 06/01/2018 If Site Rite image not attached, placement could not be confirmed due to current cardiac rhythm.     TODAY-DAY OF DISCHARGE:  Subjective:   Courtney Beard today has no headache,no chest abdominal pain,no new weakness tingling or numbness, feels much better wants to go home today.   Objective:   Blood pressure 125/77, pulse (!) 102, temperature 98.5 F (36.9 C), resp. rate 18, height 5\' 2"  (1.575 m), weight 45.6 kg, SpO2 97 %.  Intake/Output Summary (Last 24 hours) at 06/19/2018 1125 Last data filed at 06/19/2018 0851 Gross per 24 hour  Intake 480 ml  Output -  Net 480 ml   Filed Weights   06/13/18 0500 06/15/18 0456 06/18/18 0500  Weight: 46.7 kg 45.8 kg 45.6 kg    Exam: Awake Alert, Oriented *3, No new F.N deficits, Normal  affect [place].AT,PERRAL Supple Neck,No JVD, No cervical lymphadenopathy appriciated.  Symmetrical Chest wall movement, Good air movement bilaterally, CTAB RRR,No Gallops,Rubs or new Murmurs, No Parasternal Heave +ve B.Sounds, Abd Soft, Non tender, No organomegaly appriciated, No rebound -guarding or rigidity. No Cyanosis, Clubbing or edema, No new Rash or bruise   PERTINENT RADIOLOGIC STUDIES: Dg Chest 1 View  Result Date: 05/23/2018 CLINICAL DATA:  Aspiration pneumonia. Acute hypoxemic respiratory failure. Central line placement.  EXAM: CHEST  1 VIEW COMPARISON:  Prior today FINDINGS: A new right jugular central venous catheter is seen with tip overlying the distal SVC. Endotracheal tube and nasogastric tube remain in appropriate position. No pneumothorax visualized. Worsening airspace disease is seen throughout the right lung. Mild airspace disease in the medial left lower lung also shows mild worsening. IMPRESSION: New right jugular central venous catheter in appropriate position. No pneumothorax visualized. Interval worsening of airspace disease throughout the right lung and in the medial left lower lobe. Electronically Signed   By: Myles Rosenthal M.D.   On: 05/23/2018 23:07   Ct Head Wo Contrast  Result Date: 05/23/2018 CLINICAL DATA:  Pulseless arrest, status post CPR. EXAM: CT HEAD WITHOUT CONTRAST CT CERVICAL SPINE WITHOUT CONTRAST TECHNIQUE: Multidetector CT imaging of the head and cervical spine was performed following the standard protocol without intravenous contrast. Multiplanar CT image reconstructions of the cervical spine were also generated. COMPARISON:  None. FINDINGS: CT HEAD FINDINGS Brain: There is no mass, hemorrhage or extra-axial collection. The size and configuration of the ventricles and extra-axial CSF spaces are normal. There is no acute or chronic infarction. The brain parenchyma is normal. Vascular: No abnormal hyperdensity of the major intracranial arteries or dural venous sinuses. No intracranial atherosclerosis. Skull: The visualized skull base, calvarium and extracranial soft tissues are normal. Sinuses/Orbits: Fluid layering in the maxillary and sphenoid sinuses. The orbits are normal. CT CERVICAL SPINE FINDINGS Alignment: There is grade 1 anterolisthesis at C3-4, C4-5 and C5-6 secondary to facet hypertrophy. There is normal facet alignment. Lateral masses of C1 and C2 and the occipital condyles are aligned. Skull base and vertebrae: No acute fracture. Soft tissues and spinal canal: No prevertebral fluid or  swelling. No visible canal hematoma. Disc levels: No advanced spinal canal or neural foraminal stenosis. Upper chest: Large area of consolidation in the right upper lobe. Other: Patient is intubated with the endotracheal tube tip beyond the field of view. IMPRESSION: 1. No acute intracranial abnormality. 2. No acute fracture of the cervical spine. 3. Large area of right upper lobe consolidation, possibly secondary to aspiration. Electronically Signed   By: Deatra Robinson M.D.   On: 05/23/2018 06:34   Ct Cervical Spine Wo Contrast  Result Date: 05/23/2018 CLINICAL DATA:  Pulseless arrest, status post CPR. EXAM: CT HEAD WITHOUT CONTRAST CT CERVICAL SPINE WITHOUT CONTRAST TECHNIQUE: Multidetector CT imaging of the head and cervical spine was performed following the standard protocol without intravenous contrast. Multiplanar CT image reconstructions of the cervical spine were also generated. COMPARISON:  None. FINDINGS: CT HEAD FINDINGS Brain: There is no mass, hemorrhage or extra-axial collection. The size and configuration of the ventricles and extra-axial CSF spaces are normal. There is no acute or chronic infarction. The brain parenchyma is normal. Vascular: No abnormal hyperdensity of the major intracranial arteries or dural venous sinuses. No intracranial atherosclerosis. Skull: The visualized skull base, calvarium and extracranial soft tissues are normal. Sinuses/Orbits: Fluid layering in the maxillary and sphenoid sinuses. The orbits are normal. CT CERVICAL SPINE FINDINGS Alignment: There is  grade 1 anterolisthesis at C3-4, C4-5 and C5-6 secondary to facet hypertrophy. There is normal facet alignment. Lateral masses of C1 and C2 and the occipital condyles are aligned. Skull base and vertebrae: No acute fracture. Soft tissues and spinal canal: No prevertebral fluid or swelling. No visible canal hematoma. Disc levels: No advanced spinal canal or neural foraminal stenosis. Upper chest: Large area of  consolidation in the right upper lobe. Other: Patient is intubated with the endotracheal tube tip beyond the field of view. IMPRESSION: 1. No acute intracranial abnormality. 2. No acute fracture of the cervical spine. 3. Large area of right upper lobe consolidation, possibly secondary to aspiration. Electronically Signed   By: Deatra Robinson M.D.   On: 05/23/2018 06:34   Dg Chest Port 1 View  Result Date: 06/10/2018 CLINICAL DATA:  Fall pneumonia EXAM: PORTABLE CHEST 1 VIEW COMPARISON:  06/08/2018 FINDINGS: Right-sided PICC line is again noted and stable. The endotracheal tube is been removed in the interval. The nasogastric catheter has been exchanged for a feeding tube. Patchy infiltrates are again noted bilaterally right greater than left. No bony abnormality is noted. IMPRESSION: Stable patchy infiltrates. Electronically Signed   By: Alcide Clever M.D.   On: 06/10/2018 10:55   Dg Chest Port 1 View  Result Date: 06/08/2018 CLINICAL DATA:  Acute respiratory failure with hypoxemia EXAM: PORTABLE CHEST 1 VIEW COMPARISON:  06/07/2018 FINDINGS: Cardiac shadow is stable. Endotracheal tube, nasogastric catheter and right-sided PICC line are again seen and stable. Patchy infiltrates are again identified throughout both lungs right greater than left. No sizable effusion is seen. No bony abnormality is noted. IMPRESSION: Stable patchy infiltrates bilaterally right greater than left. Tubes and lines as described. Electronically Signed   By: Alcide Clever M.D.   On: 06/08/2018 07:24   Dg Chest Port 1 View  Result Date: 06/07/2018 CLINICAL DATA:  Acute respiratory failure with hypoxemia EXAM: PORTABLE CHEST 1 VIEW COMPARISON:  06/06/2018 FINDINGS: Multifocal patchy opacities, right lung predominant, grossly unchanged. No pleural effusion or pneumothorax. Endotracheal tube terminates 8 cm above the carina. Right arm PICC terminates at the cavoatrial junction. Enteric tube courses into the stomach. IMPRESSION:  Multifocal pneumonia, right lobe predominant, unchanged. Endotracheal tube terminates 8 cm above the carina. Additional support apparatus as above. Electronically Signed   By: Charline Bills M.D.   On: 06/07/2018 08:36   Dg Chest Port 1 View  Result Date: 06/06/2018 CLINICAL DATA:  Acute respiratory failure.  Hypoxemia. EXAM: PORTABLE CHEST 1 VIEW COMPARISON:  June 05, 2018 FINDINGS: The ETT and right PICC line are stable. The NG tube terminates below today's film. Right greater than left patchy pulmonary infiltrates are similar in the interval. No other interval changes. IMPRESSION: Multifocal pneumonia remains, similar in the interval. Stable support apparatus. Electronically Signed   By: Gerome Sam III M.D   On: 06/06/2018 08:42   Dg Chest Port 1 View  Result Date: 06/05/2018 CLINICAL DATA:  Respiratory fail, aspiration pneumonia EXAM: PORTABLE CHEST 1 VIEW COMPARISON:  Portable exam 0429 hours compared to 06/04/2018 FINDINGS: Tip of endotracheal tube projects 4.3 cm above carina. Nasogastric tube extends into stomach. RIGHT arm PICC line tip projects over SVC. Normal heart size mediastinal contours. Patchy BILATERAL airspace infiltrates consistent with multifocal pneumonia, RIGHT greater than LEFT. Infiltrates appear mildly improved versus previous exam. No pleural effusion or pneumothorax. IMPRESSION: Multifocal pneumonia, with mildly improved aeration versus previous exam. Electronically Signed   By: Ulyses Southward M.D.   On: 06/05/2018 10:10  Dg Chest Port 1 View  Result Date: 06/04/2018 CLINICAL DATA:  67 year old with respiratory failure. EXAM: PORTABLE CHEST 1 VIEW COMPARISON:  06/03/2018 FINDINGS: Endotracheal tube is 3.3 cm above the carina. Nasogastric tube extends into the abdomen. PICC line tip in the lower SVC region. Patchy parenchymal disease and airspace disease in the right lung has slightly improved, particularly at the right lung base. Probable right pleural effusion.  However, there are new or worsening patchy airspace densities along the periphery of the left lung. Heart size is within normal limits and stable. Negative for a pneumothorax. IMPRESSION: 1. New or worsening airspace disease in left lung. Findings are concerning for left lung pneumonia. 2. Persistent patchy airspace disease in the right lung is most compatible with pneumonia with slightly improved aeration at the right lung base. 3. Probable small pleural effusions. 4. Visualized support apparatuses are appropriately positioned. Electronically Signed   By: Richarda Overlie M.D.   On: 06/04/2018 10:13   Dg Chest Port 1 View  Result Date: 06/03/2018 CLINICAL DATA:  Acute respiratory failure with hypoxemia EXAM: PORTABLE CHEST 1 VIEW COMPARISON:  06/02/2018 FINDINGS: The patient is rotated to the right on today's radiograph, reducing diagnostic sensitivity and specificity. Endotracheal tube tip 2.8 cm above the carina, satisfactorily position. A nasogastric tube extends below the inferior margin of the radiograph. Right PICC line tip: Lower SVC. Continued extensive airspace opacity in the right lung, worsened at the right lung base compared to prior. There is some mild bandlike density in the left mid lung, increased in conspicuity. Heart size within normal limits. IMPRESSION: 1. Worsened airspace opacity at the right lung base with continued notable right upper lobe airspace opacity. 2. Mild increase in bandlike density in the left mid lung, probably atelectasis. 3. Support apparatus satisfactorily position. Electronically Signed   By: Gaylyn Rong M.D.   On: 06/03/2018 10:28   Dg Chest Port 1 View  Result Date: 06/02/2018 CLINICAL DATA:  Aspiration into the airway. EXAM: PORTABLE CHEST 1 VIEW COMPARISON:  Most recent comparison yesterday at 0507 hour FINDINGS: Patient is significantly rotated. Endotracheal tube approximately 15 mm from the carina. Enteric tube in place with tip and side-port below the  diaphragm. Right internal jugular central venous catheter in the mid SVC. Right upper extremity PICC with tip in the distal SVC. Patchy opacities throughout the right lung, most confluent in the lower lobe without significant interval change allowing for differences in technique. Slight improvement in left lung base opacity. Heart size and mediastinal contours are grossly unchanged, partially obscured by rotation. No pneumothorax. IMPRESSION: 1. No significant change in patchy opacities throughout the right lung allowing for differences in technique and rotation. 2. New right upper extremity PICC with tip in the distal SVC. Borderline low positioning of endotracheal tube tip 15 mm from the carina. 3. Slight improving left basilar aeration. Electronically Signed   By: Narda Rutherford M.D.   On: 06/02/2018 02:36   Dg Chest Port 1 View  Result Date: 06/01/2018 CLINICAL DATA:  67 year old female with pneumonia. Subsequent encounter. EXAM: PORTABLE CHEST 1 VIEW COMPARISON:  05/30/2018 chest x-ray. FINDINGS: Endotracheal tube tip 3.9 cm above the carina. Right central line tip mid superior vena cava level. No pneumothorax detected. Nasogastric tube courses below the diaphragm. Tip is not included on the present exam. Minimal improvement of consolidation right lower lobe and patchy consolidation right upper lobe which may represent infectious infiltrate in proper clinical setting. Asymmetric pulmonary vascular congestion/mild pulmonary edema. Heart size within normal  limits. IMPRESSION: 1. Minimal improvement of right lower lobe consolidation and patchy consolidation right upper lobe suggestive of result of pneumonia. 2. Asymmetric mild pulmonary vascular congestion/pulmonary edema without change. Electronically Signed   By: Lacy Duverney M.D.   On: 06/01/2018 07:22   Dg Chest Port 1 View  Result Date: 05/30/2018 CLINICAL DATA:  Aspiration pneumonia. EXAM: PORTABLE CHEST 1 VIEW COMPARISON:  One-view chest x-ray  05/29/2018 FINDINGS: Heart size is normal. Endotracheal tube terminates 3 cm above the carina. NG tube is in the stomach. Right IJ line is stable. Right lower lobe airspace disease is again seen. Mild edema is slightly increased. Minimal left basilar atelectasis is stable. IMPRESSION: 1. Persistent right lower lobe airspace disease compatible with pneumonia or aspiration. 2. Slight progression in edema and left basilar atelectasis. Electronically Signed   By: Marin Roberts M.D.   On: 05/30/2018 07:42   Dg Chest Port 1 View  Result Date: 05/29/2018 CLINICAL DATA:  Respiratory failure EXAM: PORTABLE CHEST 1 VIEW COMPARISON:  05/28/2018 FINDINGS: Cardiac shadow is stable. Right jugular central line, endotracheal tube and nasogastric catheter are again seen and stable. The left lung is predominantly clear although some left retrocardiac infiltrate is seen. Diffuse infiltrates are noted throughout the right lung stable in appearance when compared with the prior exam. Small right pleural effusion is noted as well. IMPRESSION: Stable bilateral infiltrates right greater than left with associated right effusion. Tubes and lines as described. Electronically Signed   By: Alcide Clever M.D.   On: 05/29/2018 07:07   Dg Chest Port 1 View  Result Date: 05/28/2018 CLINICAL DATA:  Pneumonia. EXAM: PORTABLE CHEST 1 VIEW COMPARISON:  05/28/2018. FINDINGS: Endotracheal tube, NG tube, right IJ line in stable position. Heart size stable. Diffuse bilateral pulmonary infiltrates, particular on the right side, and right-sided pleural effusion again noted. Similar findings noted on prior exam. IMPRESSION: 1.  Lines and tubes stable position. 2. Diffuse bilateral pulmonary infiltrates, right side greater than left. Right-sided pleural effusion. Similar findings on prior exam. Electronically Signed   By: Maisie Fus  Register   On: 05/28/2018 11:47   Dg Chest Portable 1 View  Result Date: 05/28/2018 CLINICAL DATA:  ARDS,  respiratory failure and aspiration pneumonia. EXAM: PORTABLE CHEST 1 VIEW COMPARISON:  05/27/2018 FINDINGS: Endotracheal tube remains with the tip projecting approximately 4 cm above the carina. Right jugular central line shows stable positioning with the tip in the lower SVC. Gastric decompression tube extends into the stomach. Relatively stable airspace consolidation bilaterally, right greater than left with potentially mildly improved aeration at the right base. No significant pleural effusions. No pneumothorax. Stable heart size. IMPRESSION: Relatively stable bilateral airspace consolidation with potentially slightly improved aeration at the right base. Electronically Signed   By: Irish Lack M.D.   On: 05/28/2018 07:53   Dg Chest Port 1 View  Result Date: 05/27/2018 CLINICAL DATA:  Shortness of breath, ARDS EXAM: PORTABLE CHEST 1 VIEW COMPARISON:  Portable chest x-ray of 05/26/2018 FINDINGS: There is slightly better aeration of the right upper lung. There appear to be bilateral pleural effusions. Opacity at the right lung base remains most likely due to pneumonia. The tip of the endotracheal tube is approximatelymm 2.0 cm above the carina. NG tube extends into the stomach. IMPRESSION: 1. Tip of endotracheal tube 2.0 cm above the carina. 2. Slightly.  Aeration of the right upper lung. 3. Bilateral pleural effusions and basilar atelectasis. Cannot exclude pneumonia particularly the right lung base. Electronically Signed   By: Renae Fickle  Gery Pray M.D.   On: 05/27/2018 10:08   Dg Chest Port 1 View  Result Date: 05/26/2018 CLINICAL DATA:  Acute respiratory failure EXAM: PORTABLE CHEST 1 VIEW COMPARISON:  Portable chest x-ray of May 25, 2018 FINDINGS: There is developed further opacification of much of the right lung. The greatest density is inferiorly. On the left there is increasing interstitial and alveolar opacity in the mid and lower lung. The heart borders are partially obscured. The heart is  top-normal in size. The pulmonary vascularity is not clearly engorged. The endotracheal tube tip is at the carina. The esophagogastric tube tip and proximal port project below the GE junction. The right internal jugular venous catheter tip projects over the midportion of the SVC. IMPRESSION: Progressive opacification of both lungs worrisome for widespread pneumonia. Low positioning of the endotracheal tube. Withdrawal by 2 cm would help assure that the tube does not migrate into the right mainstem bronchus with patient movement. Electronically Signed   By: David  [place] M.D.   On: 05/26/2018 08:40   Dg Chest Port 1 View  Result Date: 05/25/2018 CLINICAL DATA:  Acute respiratory failure EXAM: PORTABLE CHEST 1 VIEW COMPARISON:  05/23/2018 FINDINGS: Endotracheal tube, nasogastric catheter and right jugular central line are again seen. Cardiac shadow is mildly enlarged but stable. Diffuse right-sided infiltrate is noted with some increasing consolidation in the lateral right lung base. Left retrocardiac infiltrate is noted as well. No pneumothorax is identified. IMPRESSION: Bilateral infiltrates right greater than left, increasing in the right lung base. Electronically Signed   By: Alcide Clever M.D.   On: 05/25/2018 07:24   Dg Chest Port 1 View  Result Date: 05/23/2018 CLINICAL DATA:  Suspected DVT.  Endotracheal tube. EXAM: PORTABLE CHEST 1 VIEW COMPARISON:  One-view chest x-ray 05/23/2018 at 4:13 a.m. FINDINGS: Heart size is normal. Pacing wires are in place. NG tube courses off the inferior border of the film. The endotracheal tube has been pulled back, now terminating 4.5 cm above the carina. Right middle lobe and lower lobe airspace disease is again seen. Asymmetric edema is present on the right. IMPRESSION: 1. Interval adjustment of endotracheal tube, now in satisfactory position. 2. Right middle and lower lobe airspace disease. This is concerning for infection versus aspiration. Underlying mass is not  excluded. Electronically Signed   By: Marin Roberts M.D.   On: 05/23/2018 07:28   Dg Chest Portable 1 View  Result Date: 05/23/2018 CLINICAL DATA:  Intubation EXAM: PORTABLE CHEST 1 VIEW COMPARISON:  None. FINDINGS: Endotracheal tube tip is within the proximal right mainstem bronchus. This should be retracted by 4.5 cm to place it at the level of the clavicular heads. There is a large area of right parahilar consolidation. The left lung is clear. No pleural effusion or pneumothorax. The nasogastric tube side port is below the diaphragm but not clearly visualized. A dedicated abdominal radiograph may be helpful. IMPRESSION: 1. Endotracheal tube tip in the proximal right mainstem bronchus. Retraction by 4 cm recommended. 2. Nasogastric tube side port below the diaphragm but not clearly visualized. Dedicated abdominal radiograph may be helpful. 3. Large area of right parahilar and medial right lung apex consolidation which may indicate infection or neoplastic process. Chest CT or Followup PA and lateral chest X-ray is recommended in 3-4 weeks following trial of antibiotic therapy to ensure resolution and exclude underlying malignancy. These results were called by telephone at the time of interpretation on 05/23/2018 at 5:18 am to Dr. Jaci Carrel , who verbally acknowledged these results.  Electronically Signed   By: Deatra Robinson M.D.   On: 05/23/2018 05:18   Dg Abd Portable 1v  Result Date: 06/08/2018 CLINICAL DATA:  Nasogastric tube placement. EXAM: PORTABLE ABDOMEN - 1 VIEW COMPARISON:  Abdominal radiograph May 23, 2018 FINDINGS: Feeding tube tip projects in second portion of duodenum. Included bowel gas pattern is nondilated and nonobstructive. No intra-abdominal mass effect. Reticulonodular densities and alveolar airspace opacities included lung bases. Soft tissue planes and included osseous structures are non suspicious. IMPRESSION: Feeding tube tip projects in duodenum. Electronically  Signed   By: Awilda Metro M.D.   On: 06/08/2018 16:09   Dg Abd Portable 1v  Result Date: 05/23/2018 CLINICAL DATA:  OG tube placement. EXAM: PORTABLE ABDOMEN - 1 VIEW COMPARISON:  One-view chest x-ray of the same day. FINDINGS: OG tube is now in place. The side port is in the mid stomach. Pacing leads are stable. Right middle and lower lobe pneumonia are stable. IMPRESSION: 1. Satisfactory positioning of OG tube in the stomach. 2. Right lower lobe pneumonia. Electronically Signed   By: Marin Roberts M.D.   On: 05/23/2018 10:44   [place] Ekg Site Rite  Result Date: 06/01/2018 If Site Rite image not attached, placement could not be confirmed due to current cardiac rhythm.    PERTINENT LAB RESULTS: CBC: Recent Labs    06/18/18 0428  WBC 10.2  HGB 8.7*  HCT 27.8*  PLT 550*   CMET CMP     Component Value Date/Time   NA 136 06/18/2018 0428   K 4.2 06/18/2018 0428   CL 102 06/18/2018 0428   CO2 23 06/18/2018 0428   GLUCOSE 109 (H) 06/18/2018 0428   BUN 19 06/18/2018 0428   CREATININE 0.71 06/18/2018 0428   CALCIUM 9.4 06/18/2018 0428   PROT 5.1 (L) 06/01/2018 0349   ALBUMIN 1.5 (L) 06/01/2018 0349   AST 40 06/01/2018 0349   ALT 40 06/01/2018 0349   ALKPHOS 119 06/01/2018 0349   BILITOT 0.5 06/01/2018 0349   GFRNONAA >60 06/18/2018 0428   GFRAA >60 06/18/2018 0428    GFR Estimated Creatinine Clearance: 50.5 mL/min (by C-G formula based on SCr of 0.71 mg/dL). No results for input(s): LIPASE, AMYLASE in the last 72 hours. No results for input(s): CKTOTAL, CKMB, CKMBINDEX, TROPONINI in the last 72 hours. Invalid input(s): POCBNP No results for input(s): DDIMER in the last 72 hours. No results for input(s): HGBA1C in the last 72 hours. No results for input(s): CHOL, HDL, LDLCALC, TRIG, CHOLHDL, LDLDIRECT in the last 72 hours. No results for input(s): TSH, T4TOTAL, T3FREE, THYROIDAB in the last 72 hours.  Invalid input(s): FREET3 No results for input(s): VITAMINB12,  FOLATE, FERRITIN, TIBC, IRON, RETICCTPCT in the last 72 hours. Coags: No results for input(s): INR in the last 72 hours.  Invalid input(s): PT Microbiology: No results found for this or any previous visit (from the past 240 hour(s)).  FURTHER DISCHARGE INSTRUCTIONS:  Get Medicines reviewed and adjusted: Please take all your medications with you for your next visit with your Primary MD  Laboratory/radiological data: Please request your Primary MD to go over all hospital tests and procedure/radiological results at the follow up, please ask your Primary MD to get all Hospital records sent to his/her office.  In some cases, they will be blood work, cultures and biopsy results pending at the time of your discharge. Please request that your primary care M.D. goes through all the records of your hospital data and follows up on these results.  Also Note the following: If you experience worsening of your admission symptoms, develop shortness of breath, life threatening emergency, suicidal or homicidal thoughts you must seek medical attention immediately by calling 911 or calling your MD immediately  if symptoms less severe.  You must read complete instructions/literature along with all the possible adverse reactions/side effects for all the Medicines you take and that have been prescribed to you. Take any new Medicines after you have completely understood and accpet all the possible adverse reactions/side effects.   Do not drive when taking Pain medications or sleeping medications (Benzodaizepines)  Do not take more than prescribed Pain, Sleep and Anxiety Medications. It is not advisable to combine anxiety,sleep and pain medications without talking with your primary care practitioner  Special Instructions: If you have smoked or chewed Tobacco  in the last 2 yrs please stop smoking, stop any regular Alcohol  and or any Recreational drug use.  Wear Seat belts while driving.  Please note: You  were cared for by a hospitalist during your hospital stay. Once you are discharged, your primary care physician will handle any further medical issues. Please note that NO REFILLS for any discharge medications will be authorized once you are discharged, as it is imperative that you return to your primary care physician (or establish a relationship with a primary care physician if you do not have one) for your post hospital discharge needs so that they can reassess your need for medications and monitor your lab values.  Total Time spent coordinating discharge including counseling, education and face to face time equals 35 minutes.  SignedJeoffrey Massed 06/19/2018 11:25 AM

## 2018-06-19 NOTE — H&P (Signed)
Physical Medicine and Rehabilitation Admission H&P        Chief Complaint  Patient presents with  . Post Arrest  : HPI: Courtney Beard is a 67 year old right-handed female with history of depression, alcohol use. Per chart review patient lives with spouse independent and active prior to admission. One level home. Presented 05/23/2018 after being found unresponsive with empty bottles of Klonopin and Zanaflex at bedside. CPR was initiated. Blood pressure 80/40. Patient required intubation. Cranial CT scan reviewed, unremarkable for acute intracranial process. Alcohol level 63, ammonia level 36, troponin level negative, lactic acid 3.21, urine drug screen positive benzos, CK of 508. Echocardiogram with ejection fraction of 65% no wall motion abnormalities. Patient was extubated 06/08/2018. Hospital course septic shock protocol with acute hypoxic respiratory failure. Subcutaneous heparin for DVT prophylaxis. Nasogastric tube in place for nutritional support and diet advanced to a dysphagia #3 Honey liquid with tube removed 06/19/2018. Psychiatry follow-up awaiting plan for possible inpatient psychiatric hospitalization. Therapy evaluations completed with recommendations of physical medicine rehabilitation consult. Patient was admitted for a comprehensive rehabilitation program.   Review of Systems  Constitutional: Negative for chills and fever.  HENT: Negative for hearing loss.   Eyes: Negative for blurred vision and double vision.  Respiratory: Negative for cough and shortness of breath.   Cardiovascular: Positive for leg swelling. Negative for chest pain and palpitations.  Gastrointestinal: Positive for constipation. Negative for nausea.  Genitourinary: Negative for dysuria, flank pain and hematuria.  Musculoskeletal: Positive for myalgias.  Skin: Negative for rash.  Psychiatric/Behavioral: Positive for depression.  All other systems reviewed and are negative.   History reviewed. No  pertinent past medical history. History reviewed. No pertinent surgical history. History reviewed. No pertinent family history. Social History:  has no tobacco, alcohol, and drug history on file. Allergies: Not on File       Medications Prior to Admission  Medication Sig Dispense Refill  . Bioflavonoid Products (GRAPE SEED PO) Take 400 mg by mouth daily.      . clonazePAM (KLONOPIN) 0.5 MG tablet Take 0.5 mg by mouth 2 (two) times daily as needed for anxiety.      . Collagen 500 MG CAPS Take 500 mg by mouth daily.      . cyclobenzaprine (FLEXERIL) 10 MG tablet Take 10 mg by mouth 3 (three) times daily as needed for muscle spasms.      Marland Kitchen escitalopram (LEXAPRO) 20 MG tablet Take 20 mg by mouth daily.      . Multiple Vitamins-Minerals (MULTI-DAY PLUS MINERALS PO) Take 1 tablet by mouth daily.      . Omega-3 Fatty Acids (FISH OIL MAXIMUM STRENGTH) 1200 MG CPDR Take 1,200 capsules by mouth 2 (two) times daily.          Drug Regimen Review Drug regimen was reviewed and remains appropriate with no significant issues identified   Home: Home Living Family/patient expects to be discharged to:: Private residence Living Arrangements: Spouse/significant other Available Help at Discharge: Family, Available 24 hours/day Type of Home: House Home Access: Stairs to enter [employer] of Steps: stated there was a few steps  Entrance Stairs-Rails: Right, Left Home Layout: One level Home Equipment: None   Functional History: Prior Function Level of Independence: Independent Comments: was active, going to the gym 4-5x/week   Functional Status:  Mobility: Bed Mobility Overal bed mobility: Needs Assistance Bed Mobility: Supine to Sit Supine to sit: Min assist Sit to supine: Mod assist General bed mobility comments:  Min A for hip/trunk assist; cueing for upright posture as patient prefers forward flexion and resting on elbows Transfers Overall transfer level: Needs  assistance Equipment used: 2 person hand held assist Transfers: Sit to/from Stand Sit to Stand: Min assist General transfer comment: Min A to power up from bedside adn for immediate standing balance; able to assess BP in standing with readings of 120's/80's Ambulation/Gait Ambulation/Gait assistance: Min assist, +2 physical assistance, +2 safety/equipment Gait Distance (Feet): 30 Feet Assistive device: 2 person hand held assist Gait Pattern/deviations: Step-to pattern, Step-through pattern, Decreased stride length, Drifts right/left, Narrow base of support General Gait Details: slow pace of gait; cueing for even step length and weight shifting; unsteadiness throughout with required UE support Gait velocity: decreased   ADL: ADL Overall ADL's : Needs assistance/impaired Eating/Feeding: NPO Grooming: Min guard, Minimal assistance, Sitting Upper Body Bathing: Minimal assistance, Sitting Lower Body Bathing: Moderate assistance, Sit to/from stand Upper Body Dressing : Minimal assistance, Sitting Lower Body Dressing: Maximal assistance, Sit to/from stand Lower Body Dressing Details (indicate cue type and reason): requires totalA to don socks this session Toileting- Clothing Manipulation and Hygiene: Total assistance, +2 for physical assistance, +2 for safety/equipment, Bed level Toileting - Clothing Manipulation Details (indicate cue type and reason): totalA for peri-care after incontinent BM at bed level General ADL Comments: pt with significant weakness/fatigue; pt able to perform sit<>stand from EOB, prior to attempts for additional sit<>stand to attempt further mobility noted pt to be incontinent of BM, due to pt with increasing fatigue while sitting EOB returned to supine for completion of peri-care   Cognition: Cognition Overall Cognitive Status: Impaired/Different from baseline Orientation Level: Oriented to person, Oriented to place, Oriented to situation, Disoriented to  time Cognition Arousal/Alertness: Awake/alert Behavior During Therapy: WFL for tasks assessed/performed Overall Cognitive Status: Impaired/Different from baseline Area of Impairment: Following commands, Safety/judgement, Problem solving Following Commands: Follows one step commands with increased time Safety/Judgement: Decreased awareness of safety Problem Solving: Slow processing, Decreased initiation, Requires verbal cues, Requires tactile cues General Comments: patient soft spoken and at times difficult to understand   Physical Exam: Blood pressure 99/62, pulse (!) 108, temperature 98.3 F (36.8 C), temperature source Oral, resp. rate 20, height '5\' 2"'  (1.575 m), weight 45.8 kg, SpO2 97 %. Physical Exam  Vitals reviewed. Constitutional: She appears well-developed. No distress.  HENT:  Head: Normocephalic.  Eyes: Pupils are equal, round, and reactive to light. EOM are normal.  Neck: Normal range of motion. No tracheal deviation present. No thyromegaly present.  Cardiovascular: Exam reveals no friction rub.  No murmur heard. tachycardic  Respiratory: Effort normal. No respiratory distress. She has no wheezes.  Occasional wet cough  GI: Soft. She exhibits no distension. There is no tenderness.  Musculoskeletal: Normal range of motion. She exhibits no edema.  Neurological: She is alert.  Patient is alert and resting comfortably. She does provide her name and age. She cannot recall full events of her hospital stay or events leading up to her hospital stay. Oriented to person, hospital, month (with cues). STM deficits. Delays in basic processing. No language deficits. Fair insight and awareness. Moves all 4's 4/5 prox to distal. No focal sensory findings  Skin: Skin is warm and dry.  Psychiatric: She has a normal mood and affect. Her behavior is normal.      Lab Results Last 48 Hours  Results for orders placed or performed during the hospital encounter of 05/23/18 (from the past 48  hour(s))  CBC  Status: Abnormal    Collection Time: 06/16/18  3:54 AM  Result Value Ref Range    WBC 10.0 4.0 - 10.5 K/uL    RBC 3.21 (L) 3.87 - 5.11 MIL/uL    Hemoglobin 7.7 (L) 12.0 - 15.0 g/dL    HCT 25.3 (L) 36.0 - 46.0 %    MCV 78.8 (L) 80.0 - 100.0 fL    MCH 24.0 (L) 26.0 - 34.0 pg    MCHC 30.4 30.0 - 36.0 g/dL    RDW 15.8 (H) 11.5 - 15.5 %    Platelets 641 (H) 150 - 400 K/uL    nRBC 0.0 0.0 - 0.2 %      Comment: Performed at [facility] [address], [place], [place] [address]  Basic metabolic panel     Status: Abnormal    Collection Time: 06/16/18  3:54 AM  Result Value Ref Range    Sodium 140 135 - 145 mmol/L    Potassium 3.8 3.5 - 5.1 mmol/L    Chloride 105 98 - 111 mmol/L    CO2 27 22 - 32 mmol/L    Glucose, Bld 110 (H) 70 - 99 mg/dL    BUN 19 8 - 23 mg/dL    Creatinine, Ser 0.71 0.44 - 1.00 mg/dL    Calcium 9.3 8.9 - 10.3 mg/dL    GFR calc non Af Amer >60 >60 mL/min    GFR calc Af Amer >60 >60 mL/min      Comment: (NOTE) The eGFR has been calculated using the CKD EPI equation. This calculation has not been validated in all clinical situations. eGFR's persistently <60 mL/min signify possible Chronic Kidney Disease.      Anion gap 8 5 - 15      Comment: Performed at [facility] [address] [place], [place] [address]      Imaging Results (Last 48 hours)  No results found.           Medical Problem List and Plan: 1.  Debility secondary to acute toxic metabolic encephalopathy/septic shock/cardiac arrest with acute respiratory failure after suspected intentional drug overdose             -admit to inpatient rehab 2.  DVT Prophylaxis/Anticoagulation: Subcutaneous heparin 3. Pain Management:  Tylenol as needed 4. Mood: Lexapro 20 mg daily Klonopin 0.5 mg twice a day as needed             -team to provide ego support as necessary             -neuropsych eval 5. Neuropsych: This patient is capable of making decisions on her own  behalf. 6. Skin/Wound Care:  Routine skin checks 7. Fluids/Electrolytes/Nutrition:  Routine ins and outs with follow-up chemistries 8.Dysphagia. Dysphagia #3 honey thick liquids.Monitor hydration             -encourage PO        Post Admission Physician Evaluation: 1. Functional deficits secondary  to toxic encephalopathy. 2. Patient is admitted to receive collaborative, interdisciplinary care between the physiatrist, rehab nursing staff, and therapy team. 3. Patient's level of medical complexity and substantial therapy needs in context of that medical necessity cannot be provided at a lesser intensity of care such as a SNF. 4. Patient has experienced substantial functional loss from his/her baseline which was documented above under the "Functional History" and "Functional Status" headings.  Judging by the patient's diagnosis, physical exam, and functional history, the patient has potential for functional  progress which will result in measurable gains while on inpatient rehab.  These gains will be of substantial and practical use upon discharge  in facilitating mobility and self-care at the household level. 5. Physiatrist will provide 24 hour management of medical needs as well as oversight of the therapy plan/treatment and provide guidance as appropriate regarding the interaction of the two. 6. The Preadmission Screening has been reviewed and patient status is unchanged unless otherwise stated above. 7. 24 hour rehab nursing will assist with bladder management, bowel management, safety, skin/wound care, disease management, medication administration, pain management and patient education  and help integrate therapy concepts, techniques,education, etc. 8. PT will assess and treat for/with: Lower extremity strength, range of motion, stamina, balance, functional mobility, safety, adaptive techniques and equipment, NMR, family education.   Goals are: supervision. 9. OT will assess and treat for/with:  ADL's, functional mobility, safety, upper extremity strength, adaptive techniques and equipment, NMR, family ed.   Goals are: supervision. Therapy may proceed with showering this patient. 10. SLP will assess and treat for/with: cognition, family ed, community reentry.  Goals are: supervision. 11. Case Management and Social Worker will assess and treat for psychological issues and discharge planning. 12. Team conference will be held weekly to assess progress toward goals and to determine barriers to discharge. 13. Patient will receive at least 3 hours of therapy per day at least 5 days per week. 14. ELOS: 10-14 days       15. Prognosis:  excellent     I have personally performed a face to face diagnostic evaluation of this patient and formulated the key components of the plan.  Additionally, I have personally reviewed laboratory data, imaging studies, as well as relevant notes and concur with the physician assistant's documentation above.   Meredith Staggers, MD, Mellody Drown     Lavon Paganini Monowi, PA-C 06/17/2018  The patient's status has not changed. The original post admission physician evaluation remains appropriate, and any changes from the pre-admission screening or documentation from the acute chart are noted above.  Meredith Staggers, MD 06/19/2018

## 2018-06-19 NOTE — Progress Notes (Signed)
  Speech Language Pathology Treatment: Dysphagia  Patient Details Name: Courtney Beard MRN: [id number] DOB: [birth year] Today's Date: 06/19/2018 Time: 1478-2956 SLP Time Calculation (min) (ACUTE ONLY): 12 min  Assessment / Plan / Recommendation Clinical Impression  Pt preparing for D/C to CIR. Son present.  Pt provided with honey-thick liquids - no overt s/s of aspiration overall; single delayed cough at end of session.  Pt does have hx of silent aspiration according to FEES.  Voice is hoarse, no longer aphonic. Breath sounds have been clear last several days.  Pt is mobilizing well. Glottal closure has likely improved after prolonged oral intubation.  Occasional verbal cues provided for rate of intake/quantity.  Discussed current status and dysphagia3/honey thick liquid diet with CIR SLP, including concerns for aspiration but pt's overall improved phonation, mobility, lung status, and cognition.  Acute service to sign off.    HPI HPI: 67 y/o female admitted 05/23/18 due to post cardiac arrest in setting of presumed intentional overdose with benzodiazepine and muscle relaxants.  Bystander CPR initiated, has aspiration pneumonia and ARDS. ETT 10/12-28. Has cortrak.  Since extubation she has remained confused and periodically agitated.  She has copious secretions but has difficulty expectorating them effectively and has required frequent suctioning NT suctioning.       SLP Plan  Discharge SLP treatment due to (comment)(DF/C to CIR)       Recommendations  Diet recommendations: Dysphagia 3 (mechanical soft);Honey-thick liquid Liquids provided via: Cup Medication Administration: Crushed with puree Supervision: Full supervision/cueing for compensatory strategies Compensations: Minimize environmental distractions;Slow rate;Small sips/bites Postural Changes and/or Swallow Maneuvers: Seated upright 90 degrees                Oral Care Recommendations: Oral care BID Follow up  Recommendations: Inpatient Rehab SLP Visit Diagnosis: Dysphagia, unspecified (R13.10) Plan: Discharge SLP treatment due to (comment)(DF/C to CIR)       GO              Courtney Felling L. Samson Frederic, MA CCC/SLP Acute Rehabilitation Services Office number [phone] Pager [phone]   Blenda Mounts Laurice 06/19/2018, 2:07 PM

## 2018-06-19 NOTE — Progress Notes (Signed)
Marcello Fennel, MD  Physician  Physical Medicine and Rehabilitation  Consult Note  Signed  Date of Service:  06/15/2018 6:10 AM       Related encounter: ED to Hosp-Admission (Discharged) from 05/23/2018 in [place] [facility]      Signed      Expand All Collapse All    Show:Clear all [x] Manual[x] Template[] Copied  Added by: [x] Angiulli, Mcarthur Rossetti, PA-C[x] Marcello Fennel, MD  [] Hover for details      Physical Medicine and Rehabilitation Consult Reason for Consult: Decreased functional mobility Referring Physician: [facility]   HPI: ELLEANA STILLSON is a 67 y.o. right-handed female with history of depression, alcohol use.  Per chart review patient lives with spouse independent and active prior to admission.  One level home.  Presented 05/23/2018 after being found unresponsive with empty bottles of Klonopin and Zanaflex at bedside.  CPR was initiated.  Blood pressure 80/40.  Patient required intubation.  Cranial CT scan reviewed, unremarkable for acute intracranial process. Alcohol level 63, ammonia level 36, troponin negative, lactic acid 3.21, urine drug screen positive benzos, CK 508.  Echocardiogram with ejection fraction of 65% no wall motion abnormalities.  Patient was extubated 06/08/2018.  Hospital course septic shock protocol with acute hypoxic respiratory failure.  Subcutaneous heparin for DVT prophylaxis.  Nasogastric tube feeds await plan for swallow study.  Await plan for psychiatry consult follow-up for drug overdose.  A bedside sitter was provided for patient safety.  Therapy evaluations completed with recommendations of physical medicine rehab consult.  Review of Systems  Unable to perform ROS: Acuity of condition   Past medical history: Depression, alcohol abuse History reviewed. No pertinent surgical history, unable to obtain from patient. History reviewed. No pertinent family history, unable to obtain from patient. Social History:  has  no tobacco, alcohol, and drug history on file., unreliable from patient Allergies: Not on File       Medications Prior to Admission  Medication Sig Dispense Refill  . Bioflavonoid Products (GRAPE SEED PO) Take 400 mg by mouth daily.    . clonazePAM (KLONOPIN) 0.5 MG tablet Take 0.5 mg by mouth 2 (two) times daily as needed for anxiety.    . Collagen 500 MG CAPS Take 500 mg by mouth daily.    . cyclobenzaprine (FLEXERIL) 10 MG tablet Take 10 mg by mouth 3 (three) times daily as needed for muscle spasms.    Marland Kitchen escitalopram (LEXAPRO) 20 MG tablet Take 20 mg by mouth daily.    . Multiple Vitamins-Minerals (MULTI-DAY PLUS MINERALS PO) Take 1 tablet by mouth daily.    . Omega-3 Fatty Acids (FISH OIL MAXIMUM STRENGTH) 1200 MG CPDR Take 1,200 capsules by mouth 2 (two) times daily.      Home: Home Living Family/patient expects to be discharged to:: Private residence Living Arrangements: Spouse/significant other Available Help at Discharge: Family, Available 24 hours/day Type of Home: House Home Access: Stairs to enter [employer] of Steps: stated there was a few steps  Entrance Stairs-Rails: Right, Left Home Layout: One level Home Equipment: None  Functional History: Prior Function Level of Independence: Independent Comments: was active, going to the gym 4-5x/week Functional Status:  Mobility: Bed Mobility Overal bed mobility: Needs Assistance Bed Mobility: Supine to Sit, Sit to Supine Supine to sit: Mod assist Sit to supine: Mod assist General bed mobility comments: Mod A for trunk assist and LE assist throughout bed mobility.  Transfers Overall transfer level: Needs assistance Equipment used: 1 person hand held assist Transfers:  Sit to/from Stand Sit to Stand: Mod assist General transfer comment: boosting assist to rise and steady from EOB with pt utilizing HHA on therapists elbows; pt able to maintain static standing with modA while NT changed fitted bed  sheet  ADL: ADL Overall ADL's : Needs assistance/impaired Eating/Feeding: NPO Grooming: Min guard, Minimal assistance, Sitting Upper Body Bathing: Minimal assistance, Sitting Lower Body Bathing: Moderate assistance, Sit to/from stand Upper Body Dressing : Minimal assistance, Sitting Lower Body Dressing: Maximal assistance, Sit to/from stand Lower Body Dressing Details (indicate cue type and reason): requires totalA to don socks this session Toileting- Clothing Manipulation and Hygiene: Total assistance, +2 for physical assistance, +2 for safety/equipment, Bed level Toileting - Clothing Manipulation Details (indicate cue type and reason): totalA for peri-care after incontinent BM at bed level General ADL Comments: pt with significant weakness/fatigue; pt able to perform sit<>stand from EOB, prior to attempts for additional sit<>stand to attempt further mobility noted pt to be incontinent of BM, due to pt with increasing fatigue while sitting EOB returned to supine for completion of peri-care  Cognition: Cognition Overall Cognitive Status: Impaired/Different from baseline Orientation Level: Oriented to person, Oriented to place, Oriented to time, Disoriented to situation Cognition Arousal/Alertness: Awake/alert Behavior During Therapy: WFL for tasks assessed/performed Overall Cognitive Status: Impaired/Different from baseline Area of Impairment: Following commands, Safety/judgement, Problem solving Following Commands: Follows one step commands with increased time Problem Solving: Slow processing, Decreased initiation, Requires verbal cues, Requires tactile cues General Comments: Pt soft spoken and with some slurred speech, so difficulty understanding pt at times. Pt overall able to follow one step commands, though increased difficulty with command following when fatigued; pt incontinent of bowel during session and unaware   Blood pressure (!) 107/58, pulse 98, temperature 98.5 F (36.9  C), temperature source Oral, resp. rate (!) 27, height 5\' 2"  (1.575 m), weight 45.8 kg, SpO2 93 %. Physical Exam  Vitals reviewed. Constitutional: She appears well-developed.  67 year old frail female  HENT:  Head: Normocephalic and atraumatic.  Nasogastric tube in place  Eyes: EOM are normal. Right eye exhibits no discharge. Left eye exhibits no discharge.  Neck: Normal range of motion. Neck supple. No thyromegaly present.  Cardiovascular: Normal rate and regular rhythm.  Respiratory: Effort normal.  Limited inspiratory effort with some upper airway congestion  GI: Soft. Bowel sounds are normal. She exhibits no distension.  Musculoskeletal:  No edema or tenderness in extremities  Neurological:  Alert  Safety sitter is at bedside.   She needed multiple cues for place.   Limited awareness of deficits.   Follows simple commands. Motor: 4-4+/5 grossly throughout  Skin: Skin is warm and dry.  Psychiatric: Her affect is blunt. Her speech is delayed. She is slowed. Cognition and memory are impaired.    LabResultsLast24Hours       Results for orders placed or performed during the hospital encounter of 05/23/18 (from the past 24 hour(s))  CBC     Status: Abnormal   Collection Time: 06/15/18  3:20 AM  Result Value Ref Range   WBC 11.0 (H) 4.0 - 10.5 K/uL   RBC 3.07 (L) 3.87 - 5.11 MIL/uL   Hemoglobin 7.4 (L) 12.0 - 15.0 g/dL   HCT 16.1 (L) 09.6 - 04.5 %   MCV 80.1 80.0 - 100.0 fL   MCH 24.1 (L) 26.0 - 34.0 pg   MCHC 30.1 30.0 - 36.0 g/dL   RDW 40.9 (H) 81.1 - 91.4 %   Platelets 674 (H) 150 - 400 K/uL  nRBC 0.0 0.0 - 0.2 %  Basic metabolic panel     Status: Abnormal   Collection Time: 06/15/18  3:20 AM  Result Value Ref Range   Sodium 140 135 - 145 mmol/L   Potassium 3.7 3.5 - 5.1 mmol/L   Chloride 108 98 - 111 mmol/L   CO2 28 22 - 32 mmol/L   Glucose, Bld 93 70 - 99 mg/dL   BUN 19 8 - 23 mg/dL   Creatinine, Ser 2.13 0.44 - 1.00 mg/dL   Calcium  9.0 8.9 - 08.6 mg/dL   GFR calc non Af Amer >60 >60 mL/min   GFR calc Af Amer >60 >60 mL/min   Anion gap 4 (L) 5 - 15     ImagingResults(Last48hours)  No results found.    Assessment/Plan: Diagnosis: Debility secondary to drug overdose,?  Suicide attempt Labs and images (see above) independently reviewed.  Records reviewed and summated above.  1. Does the need for close, 24 hr/day medical supervision in concert with the patient's rehab needs make it unreasonable for this patient to be served in a less intensive setting? Potentially  2. Co-Morbidities requiring supervision/potential complications: depression (ensure mood does not hinder progress of therapies), alcohol use (counsel), leukocytosis (repeat labs, cont to monitor for signs and symptoms of infection, further workup if indicated, ABLA (repeat labs, transfuse to ensure appropriate perfusion for increased activity tolerance) 3. Due to safety, disease management and patient education, does the patient require 24 hr/day rehab nursing? Yes 4. Does the patient require coordinated care of a physician, rehab nurse, PT (1-2 hrs/day, 5 days/week), OT (1-2 hrs/day, 5 days/week) and SLP (1-2 hrs/day, 5 days/week) to address physical and functional deficits in the context of the above medical diagnosis(es)? Potentially Addressing deficits in the following areas: balance, endurance, locomotion, strength, transferring, bathing, dressing, toileting, cognition and psychosocial support 5. Can the patient actively participate in an intensive therapy program of at least 3 hrs of therapy per day at least 5 days per week? Yes 6. The potential for patient to make measurable gains while on inpatient rehab is excellent 7. Anticipated functional outcomes upon discharge from inpatient rehab are supervision  with PT, supervision with OT, supervision and min assist with SLP. 8. Estimated rehab length of stay to reach the above functional goals is: 10-14  days. 9. Anticipated D/C setting: Other 10. Anticipated post D/C treatments: HH therapy and Home excercise program 11. Overall Rehab/Functional Prognosis: excellent and good  RECOMMENDATIONS: This patient's condition is appropriate for continued rehabilitative care in the following setting: Will await for more thorough therapy evaluation as well as monitor progress and await psychiatry recommendations.  Potentially CIR. Patient has agreed to participate in recommended program. Potentially Note that insurance prior authorization may be required for reimbursement for recommended care.  Comment: Rehab Admissions Coordinator to follow up.   I have personally performed a face to face diagnostic evaluation, including, but not limited to relevant history and physical exam findings, of this patient and developed relevant assessment and plan.  Additionally, I have reviewed and concur with the physician assistant's documentation above.   Maryla Morrow, MD, ABPMR Mcarthur Rossetti Angiulli, PA-C 06/15/2018        Revision History                        Routing History

## 2018-06-19 NOTE — Progress Notes (Signed)
Standley Brooking, RN  Rehab Admission Coordinator  Physical Medicine and Rehabilitation  PMR Pre-admission  Signed  Date of Service:  06/18/2018 2:37 PM       Related encounter: ED to Hosp-Admission (Discharged) from 05/23/2018 in [facility]      Signed         Show:Clear all [x] Manual[x] Template[x] Copied  Added by: [x] Standley Brooking, RN  [] Hover for details PMR Admission Coordinator Pre-Admission Assessment  Patient: Courtney Beard is an 67 y.o., female MRN: [id number] DOB: Nov 09, 1950 Height: 5\' 2"  (157.5 cm) Weight: 45.6 kg                                                                                                                                                  Insurance Information HMO:   PPO: yes     PCP:      IPA:      80/20:      OTHER: Medicare advantage plan PRIMARY: A[employer] Medicare      Policy#: [id number]      Subscriber: pt CM Name: Rosh    Phone#: no phone avaialble     Fax#: [phone] Pre-Cert#: [id number]   For 7 days with f/u Dirk Dress phone [phone] on 11/14  Employer: retired Benefits:  Phone #: [phone]     Name: 06/17/2018 Eff. Date: 12/10/2017     Deduct: none      Out of Pocket Max: $4200      Life Max: none CIR: $250 co pay per day for 6 days      SNF: no co pay days 1 until 20; $164 co pay per day days 21 until 100 Outpatient: $40 co pay per visit     Co-Pay: visits per medical necessity Home Health: 100%      Co-Pay: visits per medical neccesity DME: 80%     Co-Pay: 20% Providers: in network  SECONDARY: none      Medicaid Application Date:       Case Manager:  Disability Application Date:       Case Worker:   Emergency Chief Operating Officer Information    Name Relation Home Work [place], [place] Spouse   [phone]   Camie, Hauss Son   [phone]   Leigh, Blas   [phone]     Current Medical History  Patient Admitting  Diagnosis: debility secondary to drug overdose  History of Present Illness: Courtney Beard is a 67 year old right-handed female with history of depression, alcohol use.  Presented 05/23/2018 after being found unresponsive with empty bottles of Klonopin and Zanaflex at bedside. CPR was initiated. Blood pressure 80/40. Patient required intubation. Cranial CT scan reviewed, unremarkable for acute intracranial process. Alcohol level63, ammonia level36, troponin level negative, lactic acid 3.21, urine drug screen positive  benzos, CK of 508. Echocardiogram with ejection fraction of 65% no wall motion abnormalities. Patient was extubated 06/08/2018. Hospital course septic shock protocol with acute hypoxic respiratory failure. Subcutaneous heparin for DVT prophylaxis. Nasogastric tube in place for nutritional supportand diet advanced to a dysphagia #1 Honey liquid. Psychiatry follow-up awaiting plan for possible inpatient psychiatric hospitalization. Cortrak removed on 06/19/18.  Past Medical History      Past Medical History:  Diagnosis Date  . Acute blood loss anemia 06/2018  . Anemia   . Depression   . H/O ETOH abuse     Family History  family history is not on file.  Prior Rehab/Hospitalizations:  Has the patient had major surgery during 100 days prior to admission? No  Current Medications   Current Facility-Administered Medications:  .  0.45 % sodium chloride infusion, , Intravenous, Continuous, Ghimire, Werner Lean, MD, Last Rate: 10 mL/hr at 06/18/18 2149 .  acetaminophen (TYLENOL) tablet 650 mg, 650 mg, Oral, Q6H PRN, Leslye Peer, MD, 650 mg at 06/19/18 1118 .  albuterol (PROVENTIL) (2.5 MG/3ML) 0.083% nebulizer solution 2.5 mg, 2.5 mg, Nebulization, Q2H PRN, Max Fickle B, MD, 2.5 mg at 06/09/18 2139 .  alteplase (CATHFLO ACTIVASE) injection 2 mg, 2 mg, Intracatheter, Once, Ghimire, Shanker M, MD .  bisacodyl (DULCOLAX) suppository 10 mg, 10 mg, Rectal, Daily  PRN, Max Fickle B, MD, 10 mg at 06/03/18 1001 .  Chlorhexidine Gluconate Cloth 2 % PADS 6 each, 6 each, Topical, Daily, Max Fickle B, MD, 6 each at 06/19/18 1023 .  clonazePAM (KLONOPIN) tablet 0.5 mg, 0.5 mg, Oral, BID PRN, Maretta Bees, MD, 0.5 mg at 06/16/18 2233 .  escitalopram (LEXAPRO) tablet 20 mg, 20 mg, Oral, Daily, Ghimire, Werner Lean, MD, 20 mg at 06/19/18 1023 .  famotidine (PEPCID) 40 MG/5ML suspension 20 mg, 20 mg, Oral, Daily, Bland, Scott, DO, 20 mg at 06/19/18 1023 .  feeding supplement (JEVITY 1.2 CAL) liquid 1,000 mL, 1,000 mL, Per Tube, Continuous, Ghimire, Werner Lean, MD, Stopped at 06/19/18 0802 .  folic acid (FOLVITE) tablet 1 mg, 1 mg, Per Tube, Daily, Oretha Milch, MD, 1 mg at 06/19/18 1023 .  food thickener (THICK IT) powder, , Oral, PRN, Ghimire, Werner Lean, MD .  free water 100 mL, 100 mL, Per Tube, Q4H, Ghimire, Werner Lean, MD, Stopped at 06/19/18 0700 .  guaiFENesin (ROBITUSSIN) 100 MG/5ML solution 200 mg, 10 mL, Per Tube, Q4H PRN, Agarwala, Ravi, MD, 200 mg at 06/16/18 2234 .  heparin injection 5,000 Units, 5,000 Units, Subcutaneous, Q8H, Tobey Grim, NP, 5,000 Units at 06/19/18 0526 .  labetalol (NORMODYNE,TRANDATE) injection 20 mg, 20 mg, Intravenous, Q10 min PRN, Simonne Martinet, NP, 20 mg at 05/26/18 1338 .  lip balm (CARMEX) ointment, , Topical, PRN, Lupita Leash, MD, 1 application at 06/12/18 1656 .  MEDLINE mouth rinse, 15 mL, Mouth Rinse, BID, Agarwala, Ravi, MD, 15 mL at 06/19/18 1024 .  polyethylene glycol (MIRALAX / GLYCOLAX) packet 17 g, 17 g, Oral, Daily PRN, Max Fickle B, MD, 17 g at 06/02/18 1501 .  potassium chloride 20 MEQ/15ML (10%) solution 40 mEq, 40 mEq, Per Tube, BID, Max Fickle B, MD, 40 mEq at 06/18/18 2131 .  sennosides (SENOKOT) 8.8 MG/5ML syrup 5 mL, 5 mL, Per Tube, Daily PRN, Max Fickle B, MD, 5 mL at 06/03/18 1000 .  sodium chloride flush (NS) 0.9 % injection 10-40 mL, 10-40 mL,  Intracatheter, Q12H, McQuaid, Douglas B, MD, 10 mL at 06/18/18 0819 .  sodium chloride flush (NS) 0.9 % injection 10-40 mL, 10-40 mL, Intracatheter, PRN, Max Fickle B, MD, 10 mL at 06/19/18 0851 .  thiamine (VITAMIN B-1) tablet 100 mg, 100 mg, Per Tube, Daily, Oretha Milch, MD, 100 mg at 06/19/18 1023  Patients Current Diet:  Diet Order                  Diet general         DIET DYS 3 Room service appropriate? Yes; Fluid consistency: Honey Thick  Diet effective now               Precautions / Restrictions Precautions Precautions: Fall, Other (comment) Precaution Comments: suicide precautions, feeding tube Restrictions Weight Bearing Restrictions: No   Has the patient had 2 or more falls or a fall with injury in the past year?No  Prior Activity Level Community (5-7x/wk): active, independent; driving, retired RT, works out gym regularly  Journalist, newspaper / Corporate investment banker Devices/Equipment: None Home Equipment: None  Prior Device Use: Indicate devices/aids used by the patient prior to current illness, exacerbation or injury? None of the above  Prior Functional Level Prior Function Level of Independence: Independent Comments: was active, going to the gym 4-5x/week  Self Care: Did the patient need help bathing, dressing, using the toilet or eating?  Independent  Indoor Mobility: Did the patient need assistance with walking from room to room (with or without device)? Independent  Stairs: Did the patient need assistance with internal or external stairs (with or without device)? Independent  Functional Cognition: Did the patient need help planning regular tasks such as shopping or remembering to take medications? Independent  Current Functional Level Cognition  Overall Cognitive Status: Impaired/Different from baseline Orientation Level: Oriented to person, Oriented to place, Disoriented to time, Disoriented to situation(States  2019, not month.) Following Commands: Follows one step commands with increased time Safety/Judgement: Decreased awareness of safety General Comments: Requiring increased time and cues. Performing two part trail making task to return to room. Pt requiring Max cues to problem solve and recall room number.     Extremity Assessment (includes Sensation/Coordination)  Upper Extremity Assessment: Generalized weakness  Lower Extremity Assessment: Defer to PT evaluation RLE Deficits / Details: Able to perform heel slides, LAQ and ankle pumps.  LLE Deficits / Details: Able to perform heel slides, LAQ and ankle pumps.     ADLs  Overall ADL's : Needs assistance/impaired Eating/Feeding: NPO Grooming: Wash/dry hands, Brushing hair, Min guard, Standing Grooming Details (indicate cue type and reason): Pt washed/dryed hands and brushed hair standing at sink. Min guard for safety. Pt required cues and increased time for fatigue. Upper Body Bathing: Minimal assistance, Sitting Lower Body Bathing: Moderate assistance, Sit to/from stand Upper Body Dressing : Minimal assistance Upper Body Dressing Details (indicate cue type and reason): Pt required Min A to don gown. Lower Body Dressing: Maximal assistance, Sit to/from stand Lower Body Dressing Details (indicate cue type and reason): requires totalA to don socks this session Toilet Transfer: Minimal assistance, With caregiver independent assisting, Ambulation, Regular Toilet Toilet Transfer Details (indicate cue type and reason): Husband helped pt to stand up. Toileting- Clothing Manipulation and Hygiene: Minimal assistance, Sit to/from stand Toileting - Clothing Manipulation Details (indicate cue type and reason): Pt required Min A for standing balance. Pt able to perform peri care. Functional mobility during ADLs: Minimal assistance, Moderate assistance General ADL Comments: Min-Mod A during functional mobility for safety and balance. Pt required max  verbal and visual  cues to problem solve to find room.    Mobility  Overal bed mobility: Modified Independent Bed Mobility: Sit to Supine Supine to sit: Min assist Sit to supine: Modified independent (Device/Increase time) General bed mobility comments: Pt quickly crawled back into bed at the end of session. Pt stated "I want to go to sleep"    Transfers  Overall transfer level: Needs assistance Equipment used: 1 person hand held assist Transfers: Sit to/from Stand Sit to Stand: Min assist General transfer comment: Min A to stabilize in standing.    Ambulation / Gait / Stairs / Wheelchair Mobility  Ambulation/Gait Ambulation/Gait assistance: Min assist, Mod assist(light mod assist without AD) Gait Distance (Feet): 130 Feet Assistive device: None, IV Pole Gait Pattern/deviations: Step-through pattern General Gait Details: slow, unsteady gait, mildy adducted or scissoring without use of AD. Gait velocity: decreased Gait velocity interpretation: <1.8 ft/sec, indicate of risk for recurrent falls    Posture / Balance Dynamic Sitting Balance Sitting balance - Comments: Reliant on minG-minA to maintain sitting balance. increased assist required with extended time sitting upright due to fatigue  Balance Overall balance assessment: Needs assistance Sitting-balance support: No upper extremity supported Sitting balance-Leahy Scale: Fair Sitting balance - Comments: Reliant on minG-minA to maintain sitting balance. increased assist required with extended time sitting upright due to fatigue  Standing balance support: During functional activity, No upper extremity supported Standing balance-Leahy Scale: Poor Standing balance comment: steady assist to stand    Special needs/care consideration BiPAP/CPAP n/a CPM n/a Continuous Drip IV n/a Dialysis n/a Life Vest n/a Oxygen n/a Special Bed n/a Trach Size n/a Wound Vac n/a Skin blister left thigh; moisture associated skin damage to  sacrum; rash mid upper back Bowel mgmt: continent LBM 11/7 Bladder mgmt:continent Diabetic mgmt n/a Safety sitter 1 : 1 Cortrak removed 11/8   Previous Home Environment Living Arrangements: Spouse/significant other  Lives With: Spouse Available Help at Discharge: (spouse will take FMLA as needed; he works at [employer]. Rad te[employer]) Type of Home: House Home Layout: Two level, Able to live on main level with bedroom/bathroom Home Access: Stairs to enter Entrance Stairs-Rails: Right, Left Entrance Stairs-Number of Steps: stated there was a few steps  Bathroom Shower/Tub: Engineer, manufacturing systems: Standard Bathroom Accessibility: Yes How Accessible: Accessible via walker Home Care Services: No  Discharge Living Setting Plans for Discharge Living Setting: Patient's home, Lives with (comment)(spouse) Type of Home at Discharge: House Discharge Home Layout: Two level, Able to live on main level with bedroom/bathroom Discharge Home Access: Stairs to enter Entrance Stairs-Rails: Right, Left Entrance Stairs-Number of Steps: 5 Discharge Bathroom Shower/Tub: Tub/shower unit Discharge Bathroom Toilet: Standard Discharge Bathroom Accessibility: Yes How Accessible: Accessible via walker Does the patient have any problems obtaining your medications?: No  Social/Family/Support Systems Patient Roles: Spouse, Parent Contact Information: spouse Anticipated Caregiver: spouse Anticipated Caregiver's Contact Information: see above Ability/Limitations of Caregiver: spouse wotks, but plans to take fmla Caregiver Availability: 24/7 Discharge Plan Discussed with Primary Caregiver: Yes Is Caregiver In Agreement with Plan?: No Does Caregiver/Family have Issues with Lodging/Transportation while Pt is in Rehab?: No  Goals/Additional Needs Patient/Family Goal for Rehab: supervision PT, OT, and SLP Expected length of stay: ELOS 10 to 14 days Special Service Needs: Recruitment consultant; psych to follow up  on suicidal ideation Pt/Family Agrees to Admission and willing to participate: Yes Program Orientation Provided & Reviewed with Pt/Caregiver Including Roles  & Responsibilities: Yes  Decrease burden of Care through IP rehab admission: n/a  Possible need for  SNF placement upon discharge: not anticipated. I have explained to spouse that patient likely will not need an inpatient psych admit after CIR. We would request psychiatry reassessment. Patient would need 24/7 supervision at d/c.  Patient Condition: This patient's medical and functional status has changed since the consult dated 06/15/2018 in which the Rehabilitation Physician determined and documented that the patient was potentially appropriate for intensive rehabilitative care in an inpatient rehabilitation facility. Issues have been addressed and update has been discussed with Dr. Riley Kill and patient now appropriate for inpatient rehabilitation. Will admit to inpatient rehab today.   Preadmission Screen Completed By:  Clois Dupes, 06/19/2018 11:32 AM ______________________________________________________________________   Discussed status with Dr. Riley Kill on 06/19/2018 at  1131 and received telephone approval for admission today.  Admission Coordinator:  Clois Dupes, time 2841 Date 06/19/2018           Cosigned by: Ranelle Oyster, MD at 06/19/2018 11:42 AM  Revision History

## 2018-06-19 NOTE — Progress Notes (Signed)
Cortrak removed per MD orders. Patient tolerated removal well. Encouraged PO intake.

## 2018-06-19 NOTE — Progress Notes (Signed)
Patient information reviewed and entered into eRehab system by Alcides Nutting, RN, CRRN, PPS Coordinator.  Information including medical coding, functional ability and quality indicators will be reviewed and updated through discharge.     Per nursing patient was given "Data Collection Information Summary for Patients in Inpatient Rehabilitation Facilities with attached "Privacy Act Statement-Health Care Records" upon admission.  

## 2018-06-19 NOTE — Progress Notes (Signed)
Physical Therapy Treatment Patient Details Name: Courtney Beard MRN: [id number] DOB: 1951/05/03 Today's Date: 06/19/2018    History of Present Illness Pt is a 67 y/o female admitted secondary to Cardiac arrest complicated by aspiration PNA and ARDS secondary to overdose. Pt was intubated on 10/12 and was extubated 10/28. PMH includes Anxiety, Depression, ETOH abuse.     PT Comments    Improving steadily with mobility.  Still unsteady with minimal challenge.  Somewhat impaired cognition.  Will benefit nicely from CIR level therapy.   Follow Up Recommendations  CIR     Equipment Recommendations  None recommended by PT    Recommendations for Other Services       Precautions / Restrictions Precautions Precautions: Fall;Other (comment) Precaution Comments: suicide precautions, feeding tube    Mobility  Bed Mobility Overal bed mobility: Needs Assistance Bed Mobility: Sit to Supine     Supine to sit: Supervision     General bed mobility comments: slow to make it to EOB  Transfers Overall transfer level: Needs assistance   Transfers: Sit to/from Stand Sit to Stand: Min assist            Ambulation/Gait Ambulation/Gait assistance: Min assist Gait Distance (Feet): 50 Feet(then 140, then additional 100 feet with standing rest for re)   Gait Pattern/deviations: Step-through pattern Gait velocity: decreased Gait velocity interpretation: <1.8 ft/sec, indicate of risk for recurrent falls General Gait Details: Generally unsteady gait with drift, scissoring or narrowed BOS.  pt was asked to carry her drink along and stop to drink at intervals.   Stairs             Wheelchair Mobility    Modified Rankin (Stroke Patients Only)       Balance Overall balance assessment: Needs assistance   Sitting balance-Leahy Scale: Fair       Standing balance-Leahy Scale: Poor                              Cognition Arousal/Alertness:  Awake/alert Behavior During Therapy: WFL for tasks assessed/performed Overall Cognitive Status: Impaired/Different from baseline Area of Impairment: Following commands;Safety/judgement;Problem solving                       Following Commands: Follows one step commands with increased time Safety/Judgement: Decreased awareness of safety   Problem Solving: Decreased initiation        Exercises      General Comments General comments (skin integrity, edema, etc.): Son present      Pertinent Vitals/Pain Pain Assessment: No/denies pain    Home Living                      Prior Function            PT Goals (current goals can now be found in the care plan section) Acute Rehab PT Goals Patient Stated Goal: regain strength, get back to independence PT Goal Formulation: With patient Time For Goal Achievement: 06/26/18 Potential to Achieve Goals: Good Progress towards PT goals: Progressing toward goals    Frequency    Min 3X/week      PT Plan Current plan remains appropriate    Co-evaluation              AM-PAC PT "6 Clicks" Daily Activity  Outcome Measure  Difficulty turning over in bed (including adjusting bedclothes, sheets and blankets)?: None Difficulty moving from lying on  back to sitting on the side of the bed? : None Difficulty sitting down on and standing up from a chair with arms (e.g., wheelchair, bedside commode, etc,.)?: A Little Help needed moving to and from a bed to chair (including a wheelchair)?: A Little Help needed walking in hospital room?: A Little Help needed climbing 3-5 steps with a railing? : A Lot 6 Click Score: 19    End of Session   Activity Tolerance: Patient tolerated treatment well   Nurse Communication: Mobility status PT Visit Diagnosis: Unsteadiness on feet (R26.81);Other abnormalities of gait and mobility (R26.89)     Time: 1610-9604 PT Time Calculation (min) (ACUTE ONLY): 14 min  Charges:  $Gait  Training: 8-22 mins                     06/19/2018  Sumner Bing, PT Acute Rehabilitation Services [phone]  (pager) [phone]  (office)   Eliseo Gum Johnpaul Gillentine 06/19/2018, 3:58 PM

## 2018-06-20 ENCOUNTER — Inpatient Hospital Stay (HOSPITAL_COMMUNITY): Payer: Self-pay

## 2018-06-20 ENCOUNTER — Inpatient Hospital Stay (HOSPITAL_COMMUNITY): Payer: Self-pay | Admitting: Speech Pathology

## 2018-06-20 ENCOUNTER — Inpatient Hospital Stay (HOSPITAL_COMMUNITY): Payer: Self-pay | Admitting: Occupational Therapy

## 2018-06-20 DIAGNOSIS — G931 Anoxic brain damage, not elsewhere classified: Secondary | ICD-10-CM

## 2018-06-20 DIAGNOSIS — R1312 Dysphagia, oropharyngeal phase: Secondary | ICD-10-CM

## 2018-06-20 DIAGNOSIS — R4189 Other symptoms and signs involving cognitive functions and awareness: Secondary | ICD-10-CM

## 2018-06-20 MED ORDER — ENOXAPARIN SODIUM 40 MG/0.4ML ~~LOC~~ SOLN
40.0000 mg | SUBCUTANEOUS | Status: DC
  Administered 2018-06-20 – 2018-06-24 (×5): 40 mg via SUBCUTANEOUS
  Filled 2018-06-20 (×5): qty 0.4

## 2018-06-20 NOTE — Progress Notes (Signed)
[facility] PHYSICAL MEDICINE & REHABILITATION PROGRESS NOTE   Subjective/Complaints: Patient states shower felt great.  She is resting before therapy and plans to eat lunch. Fluid intake remains poor, she is allowed to take thin liquids with speech therapy only. Review of systems negative for chest pain shortness of breath nausea vomiting diarrhea or constipation  Objective:   No results found. Recent Labs    06/18/18 0428  WBC 10.2  HGB 8.7*  HCT 27.8*  PLT 550*   Recent Labs    06/18/18 0428  NA 136  K 4.2  CL 102  CO2 23  GLUCOSE 109*  BUN 19  CREATININE 0.71  CALCIUM 9.4    Intake/Output Summary (Last 24 hours) at 06/20/2018 0449 Last data filed at 06/19/2018 1700 Gross per 24 hour  Intake 100 ml  Output -  Net 100 ml     Physical Exam: Vital Signs Blood pressure 109/70, pulse 92, temperature 97.9 F (36.6 C), temperature source Oral, resp. rate 18, height 5\' 3"  (1.6 m), weight 45.2 kg, SpO2 99 %.   General: No acute distress Mood and affect are appropriate Heart: Regular rate and rhythm no rubs murmurs or extra sounds Lungs: Clear to auscultation, breathing unlabored, no rales or wheezes Abdomen: Positive bowel sounds, soft nontender to palpation, nondistended Extremities: No clubbing, cyanosis, or edema Skin: No evidence of breakdown, no evidence of rash Neurologic: Cranial nerves II through XII intact, motor strength is 5/5 in bilateral deltoid, bicep, tricep, grip, hip flexor, knee extensors, ankle dorsiflexor and plantar flexor Sensory exam normal sensation to light touch and proprioception in bilateral upper and lower extremities Cerebellar exam normal finger to nose to finger as well as heel to shin in bilateral upper and lower extremities Musculoskeletal: Full range of motion in all 4 extremities. No joint swelling Memory is 2/3 unrelated objects after 2-minute delay  Assessment/Plan: 1. 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  Physiatrist is providing close team supervision and 24 hour management of active medical problems listed below.  Physiatrist and rehab team continue to assess barriers to discharge/monitor patient progress toward functional and medical goals  Care Tool:  Bathing              Bathing assist       Upper Body Dressing/Undressing Upper body dressing   What is the patient wearing?: Pull over shirt, Hospital gown only    Upper body assist      Lower Body Dressing/Undressing Lower body dressing      What is the patient wearing?: Underwear/pull up, Incontinence brief     Lower body assist       Toileting Toileting    Toileting assist Assist for toileting: Supervision/Verbal cueing     Transfers Chair/bed transfer  Transfers assist  Chair/bed transfer activity did not occur: N/A        Locomotion Ambulation   Ambulation assist              Walk 10 feet activity   Assist           Walk 50 feet activity   Assist           Walk 150 feet activity   Assist           Walk 10 feet on uneven surface  activity   Assist           Wheelchair  Assist               Wheelchair 50 feet with 2 turns activity    Assist            Wheelchair 150 feet activity     Assist          Medical Problem List and Plan: 1.Debilitysecondary to acute toxic metabolic encephalopathy/septic shock/cardiac arrest with acute respiratory failure after suspected intentional drug overdose CIR PT OT speech 2. DVT Prophylaxis/Anticoagulation: Subcutaneous heparin switch to Lovenox 3. Pain Management:Tylenol as needed 4. Mood:Lexapro 20 mg dailyKlonopin 0.5 mg twice a day as needed -team to provide ego support as necessary -neuropsych eval 5. Neuropsych: This patientiscapable  of making decisions on herown behalf. 6. Skin/Wound Care:Routine skin checks 7. Fluids/Electrolytes/Nutrition:Routine ins and outs with follow-up chemistries Will need IV fluids at night until p.o. intake improves 8.Dysphagia. Dysphagia #3honey thick liquids.Monitor hydration -encourage PO    LOS: 1 days A FACE TO FACE EVALUATION WAS PERFORMED  Erick Colace 06/20/2018, 4:49 AM

## 2018-06-20 NOTE — Progress Notes (Addendum)
Speech Language Pathology Daily Session Note  Patient Details  Name: Courtney Beard MRN: [id number] Date of Birth: July 07, 1951  Today's Date: 06/20/2018 SLP Individual Time: 0830-0940 SLP Individual Time Calculation (min): 70 min  Short Term Goals: Week 1: SLP Short Term Goal 1 (Week 1): Patient will consume current diet with minimal overt s/s of aspiration with Min A verbal cues for use of swallowing compensatory strategies.  SLP Short Term Goal 2 (Week 1): Patient will consume trials of thin liquids with minimal overt s/s of aspiration over 2 sessions to assess readiness for repeat objective swallow assessment.  SLP Short Term Goal 3 (Week 1): Patient will demonstrate selective attention to tasks for ~10 minutes in a mildly distracting enviornment with Mod A verbal cues for redirection.  SLP Short Term Goal 4 (Week 1): Patient will demonstrate functional problem solving for basic and familira tasks with Mod A verbal cues.  SLP Short Term Goal 5 (Week 1): Patient will utilize external aids to recall new, daily information with Mod A verbal cues.   Skilled Therapeutic Interventions:  Skilled treatment session focused on dysphagia and cognition goals. Husband present and provided some background information. Pt's mother has dementia with behavioral disturbance. 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 This was catastrophic event for pt as she is very fearful of becoming her mother even though she has not displayed any s/s of dementia. Emotional support given. SLP further facilitated by administering the [facility] version 7.1. Pt obtained score of 16 out of 30 (n=> 26). Pt with deficits in recall, semi-complex problem, and task organization. Education provided on nature of deficits as related to current event (not an indicator of dementia), pt with good prognosis for cognitive recovery. Pt able to identify  current deficits as related to physical and dysphagia/cognitive deficits.   SLP further administered of regular textures at beginning of session. Pt with mildly prolonged mastication but was functional and demonstrated complete oral clearing. Pt with c/o food and upgraded to regualr to increase consumption. SLP later administered trials of ice chips. Pt with delayed cough x 1 in 10 trials. Pt left upright in bed, bed alarm on, telesitter in room and all needs within reach.      Pain Pain Assessment Pain Scale: 0-10 Pain Score: 0-No pain  Therapy/Group: Individual Therapy  [name] 06/20/2018, 9:58 AM

## 2018-06-20 NOTE — Evaluation (Signed)
Occupational Therapy Assessment and Plan  Patient Details  Name: Courtney Beard MRN: [id number] Date of Birth: 28-May-1951  OT Diagnosis: altered mental status and muscle weakness (generalized) Rehab Potential: Rehab Potential (ACUTE ONLY): Excellent ELOS: 7-10 days   Today's Date: 06/20/2018 OT Individual Time: 3716-9678 OT Individual Time Calculation (min): 73 min     Problem List:  Patient Active Problem List   Diagnosis Date Noted  . Metabolic encephalopathy 93/81/0175  . Depression   . Alcohol abuse   . Leukocytosis   . Acute blood loss anemia   . MDD (major depressive disorder), recurrent severe, without psychosis ([place])   . Overdose 05/23/2018  . Acute hypoxemic respiratory failure ([place])   . Aspiration pneumonia of right lung due to gastric secretions [facility])     Past Medical History:  Past Medical History:  Diagnosis Date  . Acute blood loss anemia 06/2018  . Anemia   . Depression   . H/O ETOH abuse    Past Surgical History:  Past Surgical History:  Procedure Laterality Date  . COLONOSCOPY      Assessment & Plan Clinical Impression: Patient is a 67 y.o. year old female with history of depression, alcohol use. Per chart review patient lives with spouse independent and active prior to admission. One level home. Presented 05/23/2018 after being found unresponsive with empty bottles of Klonopin and Zanaflex at bedside. CPR was initiated. Blood pressure 80/40. Patient required intubation. Cranial CT scan reviewed, unremarkable for acute intracranial process. Alcohol level63, ammonia level36, troponin level negative, lactic acid 3.21, urine drug screen positive benzos, CK of 508. Echocardiogram with ejection fraction of 65% no wall motion abnormalities. Patient was extubated 06/08/2018. Hospital course septic shock protocol with acute hypoxic respiratory failure. Subcutaneous heparin for DVT prophylaxis. Nasogastric tube in place for nutritional supportand diet  advanced to a dysphagia #3Honey liquid with tube removed 06/19/2018. Psychiatry follow-up awaiting plan for possible inpatient psychiatric hospitalization. Therapy evaluations completed with recommendations of physical medicine rehabilitation consult. Patient was admitted for a comprehensive rehabilitation program.  Patient transferred to CIR on 06/19/2018 .    Patient currently requires min with basic self-care skills secondary to muscle weakness, decreased cardiorespiratoy endurance, decreased safety awareness and decreased memory and decreased standing balance and decreased balance strategies.  Prior to hospitalization, patient could complete ADLs, iADLs and functional mobility with independent .  Patient will benefit from skilled intervention to decrease level of assist with basic self-care skills, increase independence with basic self-care skills and increase level of independence with iADL prior to discharge home with care partner.  Anticipate patient will require 24 hour supervision and follow up therapies TBD.  OT - End of Session Activity Tolerance: Tolerates 10 - 20 min activity with multiple rests;Tolerates 30+ min activity with multiple rests Endurance Deficit: Yes OT Assessment Rehab Potential (ACUTE ONLY): Excellent OT Patient demonstrates impairments in the following area(s): Balance;Endurance;Safety;Motor;Cognition OT Basic ADL's Functional Problem(s): Grooming;Bathing;Dressing;Toileting OT Advanced ADL's Functional Problem(s): Simple Meal Preparation OT Transfers Functional Problem(s): Toilet;Tub/Shower OT Additional Impairment(s): None OT Plan OT Intensity: Minimum of 1-2 x/day, 45 to 90 minutes OT Frequency: 5 out of 7 days OT Duration/Estimated Length of Stay: 7-10 days OT Treatment/Interventions: Balance/vestibular training;Discharge planning;Pain management;Therapeutic Activities;UE/LE Coordination activities;Cognitive remediation/compensation;Disease  mangement/prevention;Functional mobility training;Patient/family education;Therapeutic Exercise;Community reintegration;DME/adaptive equipment instruction;Psychosocial support;UE/LE Strength taining/ROM OT Self Feeding Anticipated Outcome(s): mod I OT Basic Self-Care Anticipated Outcome(s): supervision OT Toileting Anticipated Outcome(s): supervision OT Bathroom Transfers Anticipated Outcome(s): supervision OT Recommendation Recommendations for Other Services: Neuropsych consult Patient destination:  Home Follow Up Recommendations: Other (comment)(TBD) Equipment Recommended: To be determined Equipment Details: possibly shower chair    Skilled Therapeutic Intervention OT eval completed with explanation of CIR process, OT purpose and overall POC. OT treatment completed with focus on ADL retraining. Pt presents supine in bed with no c/o pain and willing to participate in therapy session, wishing to shower during this session. Obtained and issued different w/c for better fit start of session. Pt reporting need to toilet, though impulsive to attempt moving towards EOB and attempt to slide around bedrail, requiring assist to wait for therapist. Pt ambulates to bathroom with min HHA, completes toileting with overall min steadying assist in standing during clothing management. Pt performing peri-care using lateral leans seated on toilet. Transitioned to TTB where pt completed UB/LB bathing, using lateral leans to wash perineal region/buttocks this session, performing task with overall CGA. Pt performed stand pivot to w/c in bathroom with minA for completion of UB/LB dressing, completing dressing tasks with increased time and minA for balance when standing. CGA when donning footwear. Transported pt via w/c for energy conservation throughout unit to provide general tour and further education on CIR stay. Pt ambulates from rehab gym to ADL apartment with minA without AD, taking seated rest break and returning in  same manner. Transported pt back to room via w/c where pt was left supine in bed end of session with bed alarm set and safety mat in place, telesitter present, son present, call bell and needs within reach.   OT Evaluation Precautions/Restrictions  Precautions Precautions: Fall Precaution Comments: telesitter  Restrictions Weight Bearing Restrictions: No General Chart Reviewed: Yes Family/Caregiver Present: Yes Vital Signs   Pain Pain Assessment Pain Scale: 0-10 Pain Score: 0-No pain Home Living/Prior Functioning Home Living Family/patient expects to be discharged to:: Private residence Living Arrangements: Spouse/significant other Available Help at Discharge: Family(per chart review, spouse will take FMLA PRN, currently still works) Type of Home: House Home Access: Stairs to enter Technical brewer of Steps: 4 Entrance Stairs-Rails: Right, Left, Can reach both Home Layout: Two level, Bed/bath upstairs Alternate Level Stairs-Number of Steps: flight (approx 8-10) Alternate Level Stairs-Rails: Left, Right, Can reach both Bathroom Shower/Tub: Tub/shower unit, Multimedia programmer: Standard  Lives With: Spouse IADL History Homemaking Responsibilities: Yes Meal Prep Responsibility: Therapist, occupational Responsibility: Primary Cleaning Responsibility: Primary Homemaking Comments: reports spouse can assist with iADLs Current License: Yes Mode of Transportation: Car Occupation: Retired Type of Occupation: retired Statistician  Leisure and Hobbies: reports she works out 4-5x/week, enjoys reading Prior Function Level of Independence: Independent with basic ADLs, Independent with homemaking with ambulation, Independent with gait, Independent with transfers  Able to Take Stairs?: Yes Driving: Yes Vocation: Retired Leisure: Hobbies-yes (Comment) ADL ADL Upper Body Bathing: Contact guard Where Assessed-Upper Body Bathing: Shower Lower Body Bathing: Contact  guard Where Assessed-Lower Body Bathing: Shower Upper Body Dressing: Setup Where Assessed-Upper Body Dressing: Wheelchair Lower Body Dressing: Contact guard Where Assessed-Lower Body Dressing: Wheelchair Toileting: Minimal assistance Where Assessed-Toileting: Glass blower/designer: Psychiatric nurse Method: Counselling psychologist: Energy manager: Environmental education officer Method: Heritage manager: Gaffer Baseline Vision/History: Wears glasses Wears Glasses: Reading only Patient Visual Report: No change from baseline Vision Assessment?: No apparent visual deficits Perception  Perception: Within Functional Limits Praxis Praxis: Intact Cognition Overall Cognitive Status: Impaired/Different from baseline Arousal/Alertness: Awake/alert Orientation Level: Person;Place;Situation Person: Oriented Place: Oriented Situation: Oriented Year: 2019 Month: November Day of Week:  Correct Memory: Impaired Memory Impairment: Decreased short term memory Decreased Short Term Memory: Verbal basic;Functional basic Immediate Memory Recall: Sock;Blue;Bed Memory Recall: Blue;Bed Memory Recall Sock: (with multiple choice options) Memory Recall Blue: Without Cue Memory Recall Bed: Without Cue Attention: Selective Sustained Attention: Appears intact Selective Attention: Impaired Selective Attention Impairment: Functional basic;Verbal complex;Functional complex Awareness: Impaired Awareness Impairment: Intellectual impairment Problem Solving: Impaired Problem Solving Impairment: Verbal basic;Functional basic Safety/Judgment: Impaired Comments: impulsive  Sensation Sensation Light Touch: Appears Intact(UEs) Hot/Cold: Appears Intact Proprioception: Appears Intact Stereognosis: Appears Intact Coordination Gross Motor Movements are Fluid and Coordinated: Yes Fine Motor Movements are  Fluid and Coordinated: Yes(requires increased effort ) Motor  Motor Motor: Other (comment) Motor - Skilled Clinical Observations: generalized weakness  Mobility  Bed Mobility Bed Mobility: Sit to Supine;Supine to Sit Supine to Sit: Supervision/Verbal cueing Sit to Supine: Contact Guard/Touching assist Transfers Sit to Stand: Contact Guard/Touching assist Stand to Sit: Contact Guard/Touching assist  Trunk/Postural Assessment  Cervical Assessment Cervical Assessment: Within Functional Limits Thoracic Assessment Thoracic Assessment: Within Functional Limits Lumbar Assessment Lumbar Assessment: Within Functional Limits Postural Control Postural Control: Deficits on evaluation Righting Reactions: delayed   Balance Balance Balance Assessed: Yes Static Sitting Balance Static Sitting - Balance Support: No upper extremity supported;Feet supported Static Sitting - Level of Assistance: 5: Stand by assistance Dynamic Sitting Balance Dynamic Sitting - Balance Support: No upper extremity supported;Feet supported;During functional activity Dynamic Sitting - Level of Assistance: 5: Stand by assistance Dynamic Sitting - Balance Activities: Reaching for objects Sitting balance - Comments: reaching for shoes, donning/doffing socks/shoes Static Standing Balance Static Standing - Balance Support: No upper extremity supported;During functional activity Static Standing - Level of Assistance: 4: Min assist Dynamic Standing Balance Dynamic Standing - Balance Support: During functional activity;No upper extremity supported Dynamic Standing - Level of Assistance: 4: Min assist Extremity/Trunk Assessment RUE Assessment RUE Assessment: Exceptions to [facility] Active Range of Motion (AROM) Comments: WFL General Strength Comments: generalized weakness (grossly 3+/5), weak grip strength LUE Assessment LUE Assessment: Exceptions to [facility] Active Range of Motion (AROM) Comments: WFL General Strength Comments:  generalized weakness (grossly 3+/5), weak grip strength     Refer to Care Plan for Long Term Goals  Recommendations for other services: Neuropsych   Discharge Criteria: Patient will be discharged from OT if patient refuses treatment 3 consecutive times without medical reason, if treatment goals not met, if there is a change in medical status, if patient makes no progress towards goals or if patient is discharged from hospital.  The above assessment, treatment plan, treatment alternatives and goals were discussed and mutually agreed upon: by patient  Raymondo Band 06/20/2018, 12:27 PM

## 2018-06-20 NOTE — Evaluation (Signed)
Physical Therapy Assessment and Plan  Patient Details  Name: Courtney Beard MRN: [id number] Date of Birth: [birth year]  PT Diagnosis: Abnormality of gait, Cognitive deficits and Muscle weakness Rehab Potential: Good ELOS: 7-10   Today's Date: 06/20/2018 PT Individual Time: 1300-1405 PT Individual Time Calculation (min): 65 min    Problem List:  Patient Active Problem List   Diagnosis Date Noted  . Metabolic encephalopathy 56/38/9373  . Depression   . Alcohol abuse   . Leukocytosis   . Acute blood loss anemia   . MDD (major depressive disorder), recurrent severe, without psychosis ([place])   . Overdose 05/23/2018  . Acute hypoxemic respiratory failure ([place])   . Aspiration pneumonia of right lung due to gastric secretions [facility])     Past Medical History:  Past Medical History:  Diagnosis Date  . Acute blood loss anemia 06/2018  . Anemia   . Depression   . H/O ETOH abuse    Past Surgical History:  Past Surgical History:  Procedure Laterality Date  . COLONOSCOPY      Assessment & Plan Clinical Impression: Courtney Beard is a 67 year old right-handed female with history of depression, alcohol use. Per chart review patient lives with spouse independent and active prior to admission. One level home. Presented 05/23/2018 after being found unresponsive with empty bottles of Klonopin and Zanaflex at bedside. CPR was initiated. Blood pressure 80/40. Patient required intubation. Cranial CT scan reviewed, unremarkable for acute intracranial process. Alcohol level63, ammonia level36, troponin level negative, lactic acid 3.21, urine drug screen positive benzos, CK of 508. Echocardiogram with ejection fraction of 65% no wall motion abnormalities. Patient was extubated 06/08/2018. Hospital course septic shock protocol with acute hypoxic respiratory failure. Subcutaneous heparin for DVT prophylaxis. Nasogastric tube in place for nutritional supportand diet advanced to a dysphagia  #3Honey liquid with tube removed 06/19/2018. Psychiatry follow-up awaiting plan for possible inpatient psychiatric hospitalization.  Patient transferred to CIR on 06/19/2018 .   Patient currently requires min with mobility secondary to muscle weakness, decreased cardiorespiratoy endurance, impaired timing and sequencing, decreased attention, decreased awareness, decreased safety awareness and decreased memory and decreased sitting balance, decreased standing balance, decreased postural control and decreased balance strategies.  Prior to hospitalization, patient was independent  with mobility and lived with Spouse in a House home.  Home access is 4Stairs to enter.  Patient will benefit from skilled PT intervention to maximize safe functional mobility, minimize fall risk and decrease caregiver burden for planned discharge home with 24 hour supervision.  Anticipate patient will benefit from follow up OP at discharge.  PT - End of Session Activity Tolerance: Tolerates 10 - 20 min activity with multiple rests Endurance Deficit: Yes Endurance Deficit Description: lies down throughout eval due to fatigue PT Assessment Rehab Potential (ACUTE/IP ONLY): Good PT Patient demonstrates impairments in the following area(s): Balance;Safety;Behavior;Endurance;Motor PT Transfers Functional Problem(s): Bed Mobility;Bed to Chair;Car;Furniture;Floor PT Locomotion Functional Problem(s): Ambulation;Stairs PT Plan PT Intensity: Minimum of 1-2 x/day ,45 to 90 minutes PT Frequency: 5 out of 7 days PT Duration Estimated Length of Stay: 7-10 PT Treatment/Interventions: Ambulation/gait training;Community reintegration;DME/adaptive equipment instruction;Neuromuscular re-education;Psychosocial support;Stair training;UE/LE Strength taining/ROM;Wheelchair propulsion/positioning;Balance/vestibular training;Discharge planning;Therapeutic Activities;UE/LE Coordination activities;Pain management;Cognitive  remediation/compensation;Functional mobility training;Patient/family education;Splinting/orthotics;Therapeutic Exercise PT Transfers Anticipated Outcome(s): independent basic, supervision car and floor PT Locomotion Anticipated Outcome(s): supervision gait x 150' controlled, 250' community and up/down 12 steps 2 rails, independent gait 50' home PT Recommendation Follow Up Recommendations: Outpatient PT Patient destination: Home Equipment Recommended: To be determined  Skilled Therapeutic  Intervention  Pt mildly impulsive throughout session.  She was exhausted from activity today.  Pt remembered 0/3 words after 10 minutes, and was unable to remember the name of 1 of her 2 dogs.  With a letter cue from her son, she remembered the name.  Pt participated well.  Patient demonstrates increased fall risk as noted by score of   50/56 on Berg Balance Scale.  (<36= high risk for falls, close to 100%; 37-45 significant >80%; 46-51 moderate >50%; 52-55 lower >25%).  Pt had difficulty following more complex directions of parts of the Berg assessment.  Gait distances and stairs were limited by pt's fatigue.  PR discussed ELOS, LTGs and schedule with pt and her son Roderic Palau.  Her affect was quite variable during session, from smiling and laughing at jokes from her son to nearly crying several times.  PT provided emotional support.  Discussed falls precautions and use of call bell.  Pt left resting in bed with alarm set and needs at hand, with Roderic Palau present.  PT Evaluation Precautions/Restrictions Precautions Precautions: Fall Precaution Comments: telesitter  Restrictions Weight Bearing Restrictions: No Pain Pain Assessment Pain Score: 0-No pain Home Living/Prior Functioning Home Living Available Help at Discharge: Family(per chart review, spouse will take FMLA PRN, currently still works) Type of Home: House Home Access: Stairs to enter Technical brewer of Steps: 4 Entrance Stairs-Rails:  Right;Left;Can reach both Home Layout: Two level;Bed/bath upstairs Alternate Level Stairs-Number of Steps: flight (approx 8-10) Alternate Level Stairs-Rails: Left;Right;Can reach both Bathroom Shower/Tub: Tub/shower unit;Walk-in shower Bathroom Toilet: Standard  Lives With: Spouse Prior Function Level of Independence: Independent with basic ADLs;Independent with homemaking with ambulation;Independent with gait;Independent with transfers  Able to Take Stairs?: Yes Driving: Yes Vocation: Retired RT Leisure: Hobbies-yes (Comment) Comments: was active, going to the gym 4-5x/week; questionable excessive activity wiht multiple classes at gym each day Vision/Perception  Perception Perception: Within Functional Limits Praxis Praxis: Intact  Cognition Overall Cognitive Status: Impaired/Different from baseline Arousal/Alertness: Awake/alert Orientation Level: Oriented X4 Attention: Selective Sustained Attention: Impaired(in busy environement, fatigued) Selective Attention: Impaired Selective Attention Impairment: Functional basic;Verbal complex;Functional complex Memory: Impaired Memory Impairment: Decreased short term memory;Decreased recall of new information Decreased Short Term Memory: Verbal basic;Functional basic Awareness: Impaired Awareness Impairment: Intellectual impairment Problem Solving: Impaired Problem Solving Impairment: Verbal basic;Functional basic Safety/Judgment: Impaired Comments: impulsive  Sensation Sensation Light Touch: Appears Intact Proprioception: Appears Intact Coordination Gross Motor Movements are Fluid and Coordinated: Yes Fine Motor Movements are Fluid and Coordinated: Yes Finger Nose Finger Test: wNLs Heel Shin Test: WNLs bil Motor  Motor Motor: Other (comment) Motor - Skilled Clinical Observations: generalized weakness   Mobility Bed Mobility Bed Mobility: Rolling Right;Rolling Left;Right Sidelying to Sit;Left Sidelying to Sit Rolling  Right: Independent Rolling Left: Independent Right Sidelying to Sit: Supervision/Verbal cueing Left Sidelying to Sit: Supervision/Verbal cueing Supine to Sit: Supervision/Verbal cueing Sit to Supine: Supervision/Verbal cueing Transfers Transfers: Sit to Stand;Stand to Sit;Stand Pivot Transfers Sit to Stand: Contact Guard/Touching assist Stand to Sit: Contact Guard/Touching assist Stand Pivot Transfers: Contact Guard/Touching assist Stand Pivot Transfer Details: Verbal cues for precautions/safety Transfer (Assistive device): None Locomotion  Gait Ambulation: Yes Gait Assistance: Contact Guard/Touching assist Gait Distance (Feet): 60 Feet Assistive device: None Gait Gait: Yes Gait Pattern: Impaired Gait Pattern: Decreased trunk rotation;Narrow base of support Stairs / Additional Locomotion Stairs: Yes Stairs Assistance: Minimal Assistance - Patient > 75% Stair Management Technique: Two rails Number of Stairs: 4 Height of Stairs: 6 Wheelchair Mobility Wheelchair Mobility: No  Trunk/Postural Assessment  Cervical Assessment Cervical Assessment: Within Functional Limits Thoracic Assessment Thoracic Assessment: Within Functional Limits Lumbar Assessment Lumbar Assessment: Within Functional Limits Postural Control Postural Control: Deficits on evaluation Righting Reactions: delayed   Balance Balance Balance Assessed: Yes(given external perturbations, delayed hip and stepping strategies) Standardized Balance Assessment Standardized Balance Assessment: Berg Balance Test Berg Balance Test Sit to Stand: Able to stand without using hands and stabilize independently Standing Unsupported: Able to stand safely 2 minutes Sitting with Back Unsupported but Feet Supported on Floor or Stool: Able to sit safely and securely 2 minutes Stand to Sit: Sits safely with minimal use of hands Transfers: Able to transfer safely, minor use of hands Standing Unsupported with Eyes Closed: Able to  stand 10 seconds with supervision Standing Ubsupported with Feet Together: Able to place feet together independently and stand 1 minute safely From Standing, Reach Forward with Outstretched Arm: Can reach forward >12 cm safely (5") From Standing Position, Pick up Object from Floor: Able to pick up shoe safely and easily From Standing Position, Turn to Look Behind Over each Shoulder: Looks behind from both sides and weight shifts well Turn 360 Degrees: Able to turn 360 degrees safely in 4 seconds or less Standing Unsupported, Alternately Place Feet on Step/Stool: Able to complete >2 steps/needs minimal assist Standing Unsupported, One Foot in Front: Able to plae foot ahead of the other independently and hold 30 seconds Standing on One Leg: Able to lift leg independently and hold > 10 seconds Total Score: 50 Static Sitting Balance Static Sitting - Balance Support: No upper extremity supported;Feet supported Dynamic Sitting Balance Dynamic Sitting - Balance Support: No upper extremity supported;Feet supported;During functional activity Dynamic Sitting - Balance Activities: Reaching for objects Sitting balance - Comments: floor Static Standing Balance Static Standing - Balance Support: No upper extremity supported;During functional activity Static Standing - Level of Assistance: 5: Stand by assistance Dynamic Standing Balance Dynamic Standing - Balance Support: During functional activity;No upper extremity supported Dynamic Standing - Level of Assistance: 5: Stand by assistance Extremity Assessment      RLE Assessment RLE Assessment: Exceptions to [facility] General Strength Comments: grossly in sitting: hip flex 4/5; ankle DF 4/5 LLE Assessment LLE Assessment: Within Functional Limits General Strength Comments: ankle DF 4/5    Refer to Care Plan for Long Term Goals  Recommendations for other services: Neuropsych  Discharge Criteria: Patient will be discharged from PT if patient refuses  treatment 3 consecutive times without medical reason, if treatment goals not met, if there is a change in medical status, if patient makes no progress towards goals or if patient is discharged from hospital.  The above assessment, treatment plan, treatment alternatives and goals were discussed and mutually agreed upon: by patient and by family  Meziah Blasingame 06/20/2018, 5:14 PM

## 2018-06-21 MED ORDER — ACETAMINOPHEN 325 MG PO TABS
650.0000 mg | ORAL_TABLET | Freq: Four times a day (QID) | ORAL | Status: DC | PRN
  Administered 2018-06-21 – 2018-06-25 (×9): 650 mg via ORAL
  Filled 2018-06-21 (×9): qty 2

## 2018-06-21 NOTE — Progress Notes (Signed)
Multi attempts OOB without assistanc .  ? related to HS IVF's. Easily redirected. Telesitter and alarms in place to alert staff. A & O to self and place. C/O pain to hands-PRN tylenol given X 2. Courtney Beard A

## 2018-06-21 NOTE — Plan of Care (Signed)
  Problem: RH SAFETY Goal: RH STG ADHERE TO SAFETY PRECAUTIONS W/ASSISTANCE/DEVICE Description STG Adhere to Safety Precautions With min Assistance/Device.  Outcome: Not Progressing; telesitter   

## 2018-06-21 NOTE — Progress Notes (Signed)
[place] PHYSICAL MEDICINE & REHABILITATION PROGRESS NOTE   Subjective/Complaints: Patient moved to another room secondary to getting up with poor safety awareness.  Patient remembers me but states she saw me last 2 days ago rather than yesterday.  We discussed her cognitive deficits as well as her balance problem as well as her swallowing issues.  She was oriented to the rehab process Review of systems negative for chest pain shortness of breath nausea vomiting diarrhea or constipation  Objective:   No results found. No results for input(s): WBC, HGB, HCT, PLT in the last 72 hours. No results for input(s): NA, K, CL, CO2, GLUCOSE, BUN, CREATININE, CALCIUM in the last 72 hours.  Intake/Output Summary (Last 24 hours) at 06/21/2018 1041 Last data filed at 06/21/2018 0801 Gross per 24 hour  Intake 660 ml  Output -  Net 660 ml     Physical Exam: Vital Signs Blood pressure 119/74, pulse (!) 106, temperature 98 F (36.7 C), temperature source Oral, resp. rate 18, height 5\' 3"  (1.6 m), weight 45.2 kg, SpO2 93 %.   General: No acute distress Mood and affect are appropriate Heart: Regular rate and rhythm no rubs murmurs or extra sounds Lungs: Clear to auscultation, breathing unlabored, no rales or wheezes Abdomen: Positive bowel sounds, soft nontender to palpation, nondistended Extremities: No clubbing, cyanosis, or edema Skin: No evidence of breakdown, no evidence of rash Neurologic: Cranial nerves II through XII intact, motor strength is 5/5 in bilateral deltoid, bicep, tricep, grip, hip flexor, knee extensors, ankle dorsiflexor and plantar flexor Sensory exam normal sensation to light touch and proprioception in bilateral upper and lower extremities Cerebellar exam normal finger to nose to finger as well as heel to shin in bilateral upper and lower extremities Musculoskeletal: Full range of motion in all 4 extremities. No joint swelling Memory is 2/3 unrelated objects after  2-minute delay  Assessment/Plan: 1. 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  Physiatrist is providing close team supervision and 24 hour management of active medical problems listed below.  Physiatrist and rehab team continue to assess barriers to discharge/monitor patient progress toward functional and medical goals  Care Tool:  Bathing    Body parts bathed by patient: Right arm, Left arm, Chest, Abdomen, Right upper leg, Buttocks, Front perineal area, Left upper leg, Right lower leg, Left lower leg         Bathing assist Assist Level: Contact Guard/Touching assist     Upper Body Dressing/Undressing Upper body dressing   What is the patient wearing?: Pull over shirt    Upper body assist Assist Level: Supervision/Verbal cueing    Lower Body Dressing/Undressing Lower body dressing      What is the patient wearing?: Underwear/pull up     Lower body assist Assist for lower body dressing: Minimal Assistance - Patient > 75%     Toileting Toileting    Toileting assist Assist for toileting: Supervision/Verbal cueing     Transfers Chair/bed transfer  Transfers assist  Chair/bed transfer activity did not occur: N/A  Chair/bed transfer assist level: Supervision/Verbal cueing     Locomotion Ambulation   Ambulation assist      Assist level: Contact Guard/Touching assist   Max distance: 60   Walk 10 feet activity   Assist     Assist level: Contact Guard/Touching assist Assistive device: Hand held assist   Walk 50 feet activity   Assist  Assist level: Contact Guard/Touching assist Assistive device: Hand held assist    Walk 150 feet activity   Assist           Walk 10 feet on uneven surface  activity   Assist Walk 10 feet on uneven surfaces activity did not occur: Safety/medical concerns(fatigue)          Wheelchair     Assist Will patient use wheelchair at discharge?: No   Wheelchair activity did not occur: Safety/medical concerns(fatigue)         Wheelchair 50 feet with 2 turns activity    Assist            Wheelchair 150 feet activity     Assist          Medical Problem List and Plan: 1.Debilitysecondary to acute toxic metabolic encephalopathy/septic shock/cardiac arrest with acute respiratory failure after suspected intentional drug overdose CIR PT OT speech, continue to educate patient on nature of her deficits 2. DVT Prophylaxis/Anticoagulation: Subcutaneous heparin switch to Lovenox 3. Pain Management:Tylenol as needed 4. Mood:Lexapro 20 mg dailyKlonopin 0.5 mg twice a day as needed -team to provide ego support as necessary -neuropsych eval 5. Neuropsych: This patientiscapable of making decisions on herown behalf. 6. Skin/Wound Care:Routine skin checks 7. Fluids/Electrolytes/Nutrition:Routine ins and outs with follow-up chemistries Will need IV fluids at night until p.o. intake improves, which will likely be when her swallowing restrictions are eased 8.Dysphagia. Dysphagia #3honey thick liquids.Monitor hydration -encourage PO, question repeat modified this week    LOS: 2 days A FACE TO FACE EVALUATION WAS PERFORMED  Erick Colace 06/21/2018, 10:41 AM

## 2018-06-22 ENCOUNTER — Inpatient Hospital Stay (HOSPITAL_COMMUNITY): Payer: Self-pay | Admitting: Occupational Therapy

## 2018-06-22 ENCOUNTER — Inpatient Hospital Stay (HOSPITAL_COMMUNITY): Payer: Self-pay | Admitting: Physical Therapy

## 2018-06-22 ENCOUNTER — Inpatient Hospital Stay (HOSPITAL_COMMUNITY): Payer: Self-pay | Admitting: Speech Pathology

## 2018-06-22 LAB — COMPREHENSIVE METABOLIC PANEL
ALT: 32 U/L (ref 0–44)
AST: 19 U/L (ref 15–41)
Albumin: 2.7 g/dL — ABNORMAL LOW (ref 3.5–5.0)
Alkaline Phosphatase: 78 U/L (ref 38–126)
Anion gap: 6 (ref 5–15)
BUN: 15 mg/dL (ref 8–23)
CHLORIDE: 105 mmol/L (ref 98–111)
CO2: 25 mmol/L (ref 22–32)
Calcium: 9.1 mg/dL (ref 8.9–10.3)
Creatinine, Ser: 0.77 mg/dL (ref 0.44–1.00)
GFR calc Af Amer: >60 mL/min (ref >60)
GFR calc non Af Amer: >60 mL/min (ref >60)
Glucose, Bld: 94 mg/dL (ref 70–99)
POTASSIUM: 3.7 mmol/L (ref 3.5–5.1)
SODIUM: 136 mmol/L (ref 135–145)
Total Bilirubin: 0.4 mg/dL (ref 0.3–1.2)
Total Protein: 6 g/dL — ABNORMAL LOW (ref 6.5–8.1)

## 2018-06-22 LAB — CBC WITH DIFFERENTIAL/PLATELET
ABS IMMATURE GRANULOCYTES: 0.01 10*3/uL (ref 0.00–0.07)
BASOS ABS: 0 10*3/uL (ref 0.0–0.1)
BASOS PCT: 0 %
EOS ABS: 0.2 10*3/uL (ref 0.0–0.5)
Eosinophils Relative: 3 %
HCT: 26.7 % — ABNORMAL LOW (ref 36.0–46.0)
Hemoglobin: 8.4 g/dL — ABNORMAL LOW (ref 12.0–15.0)
IMMATURE GRANULOCYTES: 0 %
Lymphocytes Relative: 36 %
Lymphs Abs: 2.6 10*3/uL (ref 0.7–4.0)
MCH: 24.5 pg — ABNORMAL LOW (ref 26.0–34.0)
MCHC: 31.5 g/dL (ref 30.0–36.0)
MCV: 77.8 fL — ABNORMAL LOW (ref 80.0–100.0)
Monocytes Absolute: 0.6 10*3/uL (ref 0.1–1.0)
Monocytes Relative: 9 %
NEUTROS ABS: 3.8 10*3/uL (ref 1.7–7.7)
NEUTROS PCT: 52 %
NRBC: 0 % (ref 0.0–0.2)
Platelets: 439 10*3/uL — ABNORMAL HIGH (ref 150–400)
RBC: 3.43 MIL/uL — AB (ref 3.87–5.11)
RDW: 16.9 % — AB (ref 11.5–15.5)
WBC: 7.2 10*3/uL (ref 4.0–10.5)

## 2018-06-22 MED ORDER — MEGESTROL ACETATE 40 MG PO TABS
40.0000 mg | ORAL_TABLET | Freq: Every day | ORAL | Status: DC
  Administered 2018-06-22 – 2018-06-25 (×3): 40 mg via ORAL
  Filled 2018-06-22 (×4): qty 1

## 2018-06-22 NOTE — Progress Notes (Signed)
Speech Language Pathology Daily Session Note  Patient Details  Name: Courtney Beard MRN: [id number] Date of Birth: 01/29/1951  Today's Date: 06/22/2018 SLP Individual Time: 1415-1505 SLP Individual Time Calculation (min): 50 min  Short Term Goals: Week 1: SLP Short Term Goal 1 (Week 1): Patient will consume current diet with minimal overt s/s of aspiration with Min A verbal cues for use of swallowing compensatory strategies.  SLP Short Term Goal 2 (Week 1): Patient will consume trials of thin liquids with minimal overt s/s of aspiration over 2 sessions to assess readiness for repeat objective swallow assessment.  SLP Short Term Goal 3 (Week 1): Patient will demonstrate selective attention to tasks for ~10 minutes in a mildly distracting enviornment with Mod A verbal cues for redirection.  SLP Short Term Goal 4 (Week 1): Patient will demonstrate functional problem solving for basic and familira tasks with Mod A verbal cues.  SLP Short Term Goal 5 (Week 1): Patient will utilize external aids to recall new, daily information with Mod A verbal cues.   Skilled Therapeutic Interventions: Skilled treatment session focused on dysphagia and speech goals. SLP facilitated session by administering RMT testing. Patient's peak MIP was within the LLN. However, patient's peak MEP of 65cm H2O showed meaningful weakness. Therefore, SLP introduced the EMST 150 device set at ~45 cm H2O. Patient performed 25 repetitions with Min A verbal cues for accuracy and a self-perceived effort level of 6/10.  SLP also facilitated session by administering trials of ice chips and thin liquids via tsp and cup after oral care. Patient consumed trials without overt s/s of aspiration with the exception of one large coughing episode at the end of trials due to bolus size. Recommend patient initiate the water protocol with her son Courtney Beard) signed off to supervise. Suspect patient will be ready for repeat MBS on Wednesday to assess  progress and potential upgrade. Patient and son verbalized understanding of all information. Patient left upright in chair with son present. Continue with current plan of care.     Pain No/Denies Pain   Therapy/Group: Individual Therapy  Courtney Beard 06/22/2018, 3:18 PM

## 2018-06-22 NOTE — Progress Notes (Signed)
Occupational Therapy Session Note  Patient Details  Name: Courtney Beard MRN: [id number] Date of Birth: Apr 12, 1951  Today's Date: 06/22/2018 OT Individual Time: 1100-1200 OT Individual Time Calculation (min): 60 min    Short Term Goals: Week 1:  OT Short Term Goal 1 (Week 1): STG=LTG d/t ELOS  Skilled Therapeutic Interventions/Progress Updates:    Upon entering the room, pt supine in bed with son present in room and requesting to shower. Pt performed bed mobility independently. Pt ambulating in room without use of AD with overall supervision. Pt needing min cuing for safety awareness. She stood in shower to wash self and sat on commode chair to don clothing items at overall supervision level as well. Pt taking rest break and completing grooming items while seated. Son checked off to provide supervision for ambulation to toilet. Pt returning to sit on EOB for meal. Call bell and all needed items within reach.   Therapy Documentation Precautions:  Precautions Precautions: Fall Precaution Comments: telesitter  Restrictions Weight Bearing Restrictions: No Pain: Pain Assessment Pain Scale: 0-10 Pain Score: 8  Pain Type: Acute pain;Neuropathic pain Pain Location: Hand Pain Orientation: Right;Left Pain Descriptors / Indicators: Aching Pain Onset: On-going Patients Stated Pain Goal: 0 Pain Intervention(s): Medication (See eMAR) Multiple Pain Sites: Yes 2nd Pain Site Pain Score: 8 Pain Type: Acute pain Pain Location: Sacrum Pain Orientation: Mid Pain Descriptors / Indicators: Aching Pain Frequency: Intermittent Pain Onset: On-going Patient's Stated Pain Goal: 8 Pain Intervention(s): Medication (See eMAR) PAINAD (Pain Assessment in Advanced Dementia) Breathing: normal Negative Vocalization: none Facial Expression: smiling or inexpressive Body Language: relaxed Consolability: no need to console PAINAD Score: 0 Critical Care Pain Observation Tool (CPOT) Facial  Expression: Tense Body Movements: Absence of movements Muscle Tension: Relaxed Compliance with ventilator (intubated pts.): N/A Vocalization (extubated pts.): Sighing, moaning CPOT Total: 2 ADL: ADL Upper Body Bathing: Contact guard Where Assessed-Upper Body Bathing: Shower Lower Body Bathing: Contact guard Where Assessed-Lower Body Bathing: Shower Upper Body Dressing: Setup Where Assessed-Upper Body Dressing: Wheelchair Lower Body Dressing: Contact guard Where Assessed-Lower Body Dressing: Wheelchair Toileting: Minimal assistance Where Assessed-Toileting: Teacher, adult education: Curator Method: Proofreader: Acupuncturist: Insurance underwriter Method: Designer, industrial/product: Emergency planning/management officer   Therapy/Group: Individual Therapy  Alen Bleacher 06/22/2018, 12:08 PM

## 2018-06-22 NOTE — Progress Notes (Signed)
Physical Therapy Session Note  Patient Details  Name: Courtney Beard MRN: [id number] Date of Birth: [birth year]  Today's Date: 06/22/2018 PT Individual Time: 1515-1600 PT Individual Time Calculation (min): 45 min   Short Term Goals: Week 1:  PT Short Term Goal 1 (Week 1): = LTGs due to ELOS  Skilled Therapeutic Interventions/Progress Updates:    no c/o pain but reports she is hungry.  Mildly perseverative on being able to go home "ASAP".  Son present throughout session to observe, session focus on high level balance and activity tolerance.  Pt performing all mobility with supervision or better.  High level balance including gait while balance stacks of cups, 15 reps minisquats on rocker board in AP and ML direction, dynavision x2 trials on airex pad.  Floor transfer with distant supervision.  2x15 reps bird/dogs, 30 second hold stretch for hamstrings and hip flexors bilaterally.  Nustep x5 minutes LEs only at level 6 for strengthening and overall activity tolerance.  Returned to room early as pt reporting she is starving and cannot do anymore.  Declines food, reporting that her husband is bringing her dinner.  Left in care of son, call bell in reach and needs met.   Therapy Documentation Precautions:  Precautions Precautions: Fall Precaution Comments: telesitter  Restrictions Weight Bearing Restrictions: No General: PT Amount of Missed Time (min): 30 Minutes PT Missed Treatment Reason: Patient unwilling to participate;Other (Comment)(pt reports she's "Starving")    Therapy/Group: Individual Therapy  Michel Santee 06/22/2018, 4:03 PM

## 2018-06-22 NOTE — Progress Notes (Signed)
Social Work  Social Work Assessment and Plan  Patient Details  Name: Courtney Beard MRN: [id number] Date of Birth: [birth year]  Today's Date: 06/22/2018  Problem List:  Patient Active Problem List   Diagnosis Date Noted  . Metabolic encephalopathy 06/19/2018  . Depression   . Alcohol abuse   . Leukocytosis   . Acute blood loss anemia   . MDD (major depressive disorder), recurrent severe, without psychosis (HCC)   . Overdose 05/23/2018  . Acute hypoxemic respiratory failure (HCC)   . Aspiration pneumonia of right lung due to gastric secretions [facility])    Past Medical History:  Past Medical History:  Diagnosis Date  . Acute blood loss anemia 06/2018  . Anemia   . Depression   . H/O ETOH abuse    Past Surgical History:  Past Surgical History:  Procedure Laterality Date  . COLONOSCOPY     Social History:  reports that she has quit smoking. She has never used smokeless tobacco. She reports that she drinks alcohol. She reports that she does not use drugs.  Family / Support Systems Marital Status: Married Patient Roles: Spouse, Parent Spouse/Significant Other: spouse, Izora Benn @ (C) [phone] Children: son, Dejha King (local) @ (C) [phone];  son, Vail Basista [facility]) @ [phone] Anticipated Caregiver: spouse Ability/Limitations of Caregiver: spouse wotks, but plans to take fmla Caregiver Availability: 24/7 Family Dynamics: Pt's spouse very concerned and involved in pt's CIR program.  Son here daily as well and both are prepared to share in provision of 24/7 support.  Social History Preferred language: English Religion: Non-Denominational Cultural Background: NA Education: college Read: Yes Write: Yes Employment Status: Retired Date Retired/Disabled/Unemployed: Jan 2019 Legal Hisotry/Current Legal Issues: None Guardian/Conservator: None - per MD, pt is capable of making decisions on her own behalf.   Abuse/Neglect Abuse/Neglect Assessment Can  Be Completed: Yes Physical Abuse: Denies Verbal Abuse: Denies Sexual Abuse: Denies Exploitation of patient/patient's resources: Denies Self-Neglect: Denies  Emotional Status Pt's affect, behavior adn adjustment status: Pt very pleasant and able to complete assessment interview without much difficulty.  She is a little distracted by environment.  She speaks of her hospital admission/ overdose in a very matter-of-fact manner and very focused on d/c home ASAP.  Pt does not appear to be in any emotional distress.  Expresses slight irritability with not being able to go home tomorrow.  Will involved neurospychology given the nature of her admission due to suicide attempt.   Recent Psychosocial Issues: Pt denies any prior issues.  Notes she retired in Jan but denies any concerns there.  Spouse notes that, since retirement, pt had expressed feelings of worthlessness. Pyschiatric History: None Substance Abuse History: Per chart review, pt with self-report h/o of binge drinking.  Patient / Family Perceptions, Expectations & Goals Pt/Family understanding of illness & functional limitations: Pt, spouse and son with general understanding of her medical course and severity of her issues following her suicide attempt.  Pt with good understanding of her current functional limitations/ need for CIR, however, feels she is ready for d/c now. Premorbid pt/family roles/activities: Completely independent PTA and very active. Anticipated changes in roles/activities/participation: Anticipate recommendation for 24/7 supervision initially given min cognitive deficits and recent suicide attempt. Pt/family expectations/goals: "I just want to go home.  I will want to see a ENT at some point, too."  [employer]: None Premorbid Home Care/DME Agencies: None Transportation available at discharge: yes Resource referrals recommended: Neuropsychology, Support group (specify)  Discharge  Planning Living Arrangements:  Spouse/significant other Support Systems: Spouse/significant other, Children Type of Residence: Private residence Insurance Resources: Medicare([employer]) Financial Resources: Social Security Financial Screen Referred: No Living Expenses: Own Money Management: Spouse, Patient Does the patient have any problems obtaining your medications?: No Home Management: pt and spouse Patient/Family Preliminary Plans: Pt to return home with spouse who is able to take FMLA to provide needed supervision Social Work Anticipated Follow Up Needs: HH/OP Expected length of stay: 7 days  Clinical Impression Pleasant, agreeable woman here on CIR for debility following suicide attempt.  Making good progress and team anticipating short LOS.  Pt able to talk openly about her situation and has good family support.  Plan to involve neuropsychology and await his recommendations for ongoing psych f/u.  Will also follow for support and d/c planning needs.  Estoria Geary 06/22/2018, 8:38 am

## 2018-06-22 NOTE — Progress Notes (Signed)
[place] PHYSICAL MEDICINE & REHABILITATION PROGRESS NOTE   Subjective/Complaints: Frustrated that she gets out of breath so easily. Denies pain. Slept well  ROS: Patient denies fever, rash, sore throat, blurred vision, nausea, vomiting, diarrhea, cough,  chest pain, joint or back pain, headache, or mood change.   Objective:   No results found. Recent Labs    06/22/18 0547  WBC 7.2  HGB 8.4*  HCT 26.7*  PLT 439*   Recent Labs    06/22/18 0547  NA 136  K 3.7  CL 105  CO2 25  GLUCOSE 94  BUN 15  CREATININE 0.77  CALCIUM 9.1    Intake/Output Summary (Last 24 hours) at 06/22/2018 0851 Last data filed at 06/22/2018 0819 Gross per 24 hour  Intake 1291.13 ml  Output -  Net 1291.13 ml     Physical Exam: Vital Signs Blood pressure 115/74, pulse 96, temperature 98 F (36.7 C), temperature source Oral, resp. rate 18, height 5\' 3"  (1.6 m), weight 45.2 kg, SpO2 93 %.   Constitutional: No distress . Vital signs reviewed. HEENT: EOMI, oral membranes moist Neck: supple Cardiovascular: tachy 120 without murmur. No JVD    Respiratory: CTA Bilaterally without wheezes or rales. Normal effort    GI: BS +, non-tender, non-distended  Skin: No evidence of breakdown, no evidence of rash Neurologic: Cranial nerves II through XII intact, motor strength is 5/5 in bilateral deltoid, bicep, tricep, grip, hip flexor, knee extensors, ankle dorsiflexor and plantar flexor Sensory exam normal sensation to light touch and proprioception in bilateral upper and lower extremities Cerebellar exam normal finger to nose to finger as well as heel to shin in bilateral upper and lower extremities Musculoskeletal: Full range of motion in all 4 extremities. No joint swelling Mild STM deficits. Fair insight and awareness.  Psych: pleasant and cooperative  Assessment/Plan: 1. 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  Physiatrist is providing close team supervision and 24 hour management of active medical problems listed below.  Physiatrist and rehab team continue to assess barriers to discharge/monitor patient progress toward functional and medical goals  Care Tool:  Bathing    Body parts bathed by patient: Right arm, Left arm, Chest, Abdomen, Right upper leg, Buttocks, Front perineal area, Left upper leg, Right lower leg, Left lower leg         Bathing assist Assist Level: Supervision/Verbal cueing     Upper Body Dressing/Undressing Upper body dressing   What is the patient wearing?: Pull over shirt    Upper body assist Assist Level: Supervision/Verbal cueing    Lower Body Dressing/Undressing Lower body dressing      What is the patient wearing?: Underwear/pull up     Lower body assist Assist for lower body dressing: Contact Guard/Touching assist     Toileting Toileting    Toileting assist Assist for toileting: Supervision/Verbal cueing     Transfers Chair/bed transfer  Transfers assist  Chair/bed transfer activity did not occur: N/A  Chair/bed transfer assist level: Supervision/Verbal cueing     Locomotion Ambulation   Ambulation assist      Assist level: Contact Guard/Touching assist   Max distance: 60   Walk 10 feet activity   Assist     Assist level: Contact Guard/Touching assist Assistive device: Hand held assist   Walk 50 feet activity   Assist    Assist level: Contact Guard/Touching assist Assistive device: Hand held assist  Walk 150 feet activity   Assist           Walk 10 feet on uneven surface  activity   Assist Walk 10 feet on uneven surfaces activity did not occur: Safety/medical concerns(fatigue)         Wheelchair     Assist Will patient use wheelchair at discharge?: No   Wheelchair activity did not occur: Safety/medical concerns(fatigue)         Wheelchair 50  feet with 2 turns activity    Assist            Wheelchair 150 feet activity     Assist          Medical Problem List and Plan: 1.Debilitysecondary to acute toxic metabolic encephalopathy/septic shock/cardiac arrest with acute respiratory failure after suspected intentional drug overdose -Continue CIR therapies including PT, OT, and SLP  2. DVT Prophylaxis/Anticoagulation:   Lovenox 3. Pain Management:Tylenol as needed 4. Mood:Lexapro 20 mg dailyKlonopin 0.5 mg twice a day as needed -team to provide ego support as necessary -neuropsych eval 5. Neuropsych: This patientiscapable of making decisions on herown behalf. 6. Skin/Wound Care:Routine skin checks 7. Fluids/Electrolytes/Nutrition:Routine ins and outs with follow-up chemistries  -continue IV fluids at night until p.o. intake improves   -add megace for appetite  -I personally reviewed the patient's labs today.   8.Dysphagia. Dysphagia #3honey thick liquids.Monitor hydration -encourage PO, ? repeat modified this week 9. Tachycardia: due to deconditioning  -120 on exam. VS showing 80-'90's  -observe for now, consider BB  -encourage fluids     LOS: 3 days A FACE TO FACE EVALUATION WAS PERFORMED  Ranelle Oyster 06/22/2018, 8:51 AM

## 2018-06-22 NOTE — IPOC Note (Signed)
Overall Plan of Care [facility]) Patient Details Name: Courtney Beard MRN: [id number] DOB: 1951-02-20  Admitting Diagnosis: <principal problem not specified>  Hospital Problems: Active Problems:   Metabolic encephalopathy     Functional Problem List: Nursing Behavior, Bladder, Bowel, Medication Management, Safety  PT Balance, Safety, Behavior, Endurance, Motor  OT Balance, Endurance, Safety, Motor, Cognition  SLP Cognition, Nutrition  TR         Basic ADL's: OT Grooming, Bathing, Dressing, Toileting     Advanced  ADL's: OT Simple Meal Preparation     Transfers: PT Bed Mobility, Bed to Chair, Car, Furniture, Floor  OT Toilet, Tub/Shower     Locomotion: PT Ambulation, Stairs     Additional Impairments: OT None  SLP Swallowing, Communication, Social Cognition expression Social Interaction, Problem Solving, Memory, Attention, Awareness  TR      Anticipated Outcomes Item Anticipated Outcome  Self Feeding mod I  Swallowing  Supervision   Basic self-care  supervision  Toileting  supervision   Bathroom Transfers supervision  Bowel/Bladder  Cont B/B, LBM 06/19/18  Transfers  independent basic, supervision car and floor  Locomotion  supervision gait x 150' controlled, 250' community and up/down 12 steps 2 rails, independent gait 50' home  Communication  Mod I  Cognition  Min A   Pain  Denies pain 6/10. tolerable   Safety/Judgment  Refrain from falls, injuries,    Therapy Plan: PT Intensity: Minimum of 1-2 x/day ,45 to 90 minutes PT Frequency: 5 out of 7 days PT Duration Estimated Length of Stay: 7-10 OT Intensity: Minimum of 1-2 x/day, 45 to 90 minutes OT Frequency: 5 out of 7 days OT Duration/Estimated Length of Stay: 7-10 days SLP Intensity: Minumum of 1-2 x/day, 30 to 90 minutes SLP Frequency: 3 to 5 out of 7 days SLP Duration/Estimated Length of Stay: 10-14 days     Team Interventions: Nursing Interventions Patient/Family Education, Bladder  Management, Bowel Management  PT interventions Ambulation/gait training, Community reintegration, DME/adaptive equipment instruction, Neuromuscular re-education, Psychosocial support, Stair training, UE/LE Strength taining/ROM, Wheelchair propulsion/positioning, Warden/ranger, Discharge planning, Therapeutic Activities, UE/LE Coordination activities, Pain management, Cognitive remediation/compensation, Functional mobility training, Patient/family education, Splinting/orthotics, Therapeutic Exercise  OT Interventions Balance/vestibular training, Discharge planning, Pain management, Therapeutic Activities, UE/LE Coordination activities, Cognitive remediation/compensation, Disease mangement/prevention, Functional mobility training, Patient/family education, Therapeutic Exercise, Community reintegration, Fish farm manager, Psychosocial support, UE/LE Strength taining/ROM  SLP Interventions Cognitive remediation/compensation, Dysphagia/aspiration precaution training, Internal/external aids, Speech/Language facilitation, Therapeutic Activities, Environmental controls, Cueing hierarchy, Functional tasks, Patient/family education  TR Interventions    SW/CM Interventions Discharge Planning, Psychosocial Support, Patient/Family Education   Barriers to Discharge MD  Medical stability  Nursing      PT      OT      SLP      SW       Team Discharge Planning: Destination: PT-Home ,OT- Home , SLP-Home Projected Follow-up: PT-Outpatient PT, OT-  Other (comment)(TBD), SLP-24 hour supervision/assistance, Home Health SLP, Outpatient SLP Projected Equipment Needs: PT-To be determined, OT- To be determined, SLP-To be determined Equipment Details: PT- , OT-possibly shower chair  Patient/family involved in discharge planning: PT- Patient, Family member/caregiver,  OT-Patient, SLP-Patient, Family member/caregiver  MD ELOS: 7-10 days Medical Rehab Prognosis:  Excellent Assessment:  The patient has been admitted for CIR therapies with the diagnosis of anoxic encephalopathy, debility. The team will be addressing functional mobility, strength, stamina, balance, safety, adaptive techniques and equipment, self-care, bowel and bladder mgt, patient and caregiver education, cognition, activity tolerance. Goals  have been set at supervision for basic self-care and locomotion and mod I for transfers.    Ranelle Oyster, MD, FAAPMR      See Team Conference Notes for weekly updates to the plan of care

## 2018-06-23 ENCOUNTER — Inpatient Hospital Stay (HOSPITAL_COMMUNITY): Payer: Self-pay | Admitting: Physical Therapy

## 2018-06-23 ENCOUNTER — Inpatient Hospital Stay (HOSPITAL_COMMUNITY): Payer: Self-pay

## 2018-06-23 ENCOUNTER — Inpatient Hospital Stay (HOSPITAL_COMMUNITY): Payer: Self-pay | Admitting: Occupational Therapy

## 2018-06-23 MED ORDER — ENSURE ENLIVE PO LIQD: 237.0000 mL | Freq: Two times a day (BID) | ORAL | Status: DC

## 2018-06-23 NOTE — Progress Notes (Signed)
Initial Nutrition Assessment  DOCUMENTATION CODES:   Severe malnutrition in context of social or environmental circumstances, Underweight  INTERVENTION:   - MVI with minerals daily  - Ensure Enlive po BID thickened to honey-thick consistency, each supplement provides 350 kcal and 20 grams of protein (strawberry flavor)  NUTRITION DIAGNOSIS:   Severe Malnutrition related to social / environmental circumstances (depression, drug overdose) as evidenced by moderate fat depletion, severe fat depletion, moderate muscle depletion, severe muscle depletion, percent weight loss (20.2% weight loss in less than 1 month).  GOAL:   Patient will meet greater than or equal to 90% of their needs  MONITOR:   PO intake, Supplement acceptance, Diet advancement, Weight trends, Labs  REASON FOR ASSESSMENT:   Other (low BMI)    ASSESSMENT:   [age] year old female with PMH significant for depression, EtOH use, and anxiety. Per chart review patient lives with spouse independent and active prior to admission. Pt presented on 05/23/2018 after being found unresponsive with empty bottles of Klonopin and Zanaflex at bedside. CPR was initiated. Patient required intubation. Cranial CT scan unremarkable for acute intracranial process. Pt was extubated on 06/08/2018. Hospital course included septic shock protocol with acute hypoxic respiratory failure. Post-pyloric Cortrak tube was in place for nutritional supportand diet advanced to a Dysphagia 3,honey-thick liquid with tube removed on 06/19/2018. Psychiatry follow-up awaiting plan for possible inpatient psychiatric hospitalization.   11/9 - diet advanced to Regular with honey-thick liquids  Noted Megace added for appetite on 06/23/18.  Spoke with pt and son at bedside. Pt preparing to eat lunch meal. Noted chicken and dumplings, fresh asparagus spears, dinner roll, margarine, sugar cookie, and honey-thick apple juice on pt's tray (655 kcal, 22 grams of protein).  Pt states, "I'm hungry." RD encouraged PO intake at meals. Pt reports that her appetite is improving.  Pt shares that she is trying her best to eat what she needs to in order to sustain herself and fuel herself for therapies.  Pt endorses recent weight loss and reports her UBW as 105 lbs. Pt is unsure of exactly when she last weighed this. Per weight history in chart, pt with 25 lb weight loss since admission to hospital on 10/12. This is a 20.2% weight loss in less than 1 month which is significant for timeframe.  Pt amenable to trying strawberry Ensure Enlive. Pt has tried [employer] Cup and does not like it.  Noted modified barium swallow scheduled for tomorrow morning. Will follow for diet advancement.  Meal Completion: 50-100% x last 4 recorded meals  Medications reviewed and include: Pepcid, folic acid, Megace, KCl, thiamine  Labs reviewed.  NUTRITION - FOCUSED PHYSICAL EXAM:    Most Recent Value  Orbital Region  Moderate depletion  Upper Arm Region  Moderate depletion  Thoracic and Lumbar Region  Severe depletion  Buccal Region  Moderate depletion  Temple Region  Moderate depletion  Clavicle Bone Region  Severe depletion  Clavicle and Acromion Bone Region  Severe depletion  Scapular Bone Region  Moderate depletion  Dorsal Hand  Moderate depletion  Patellar Region  Moderate depletion  Anterior Thigh Region  Severe depletion  Posterior Calf Region  Severe depletion  Edema (RD Assessment)  None  Hair  Reviewed  Eyes  Reviewed  Mouth  Reviewed  Skin  Reviewed  Nails  Reviewed       Diet Order:   Diet Order            Diet regular Room service appropriate? Yes; Fluid consistency: Honey  Thick  Diet effective now              EDUCATION NEEDS:   Education needs have been addressed  Skin:  Skin Assessment: Reviewed RN Assessment  Last BM:  11/10  Height:   Ht Readings from Last 1 Encounters:  06/19/18 5\' 3"  (1.6 m)    Weight:   Wt Readings from Last 1  Encounters:  06/19/18 45.2 kg    Ideal Body Weight:  52.27 kg  BMI:  Body mass index is 17.65 kg/m.  Estimated Nutritional Needs:   Kcal:  1600-1800  Protein:  75-90 grams  Fluid:  1.6-1.8 L    Earma Reading, MS, RD, LDN Inpatient Clinical Dietitian Pager: [phone] Weekend/After Hours: [phone]

## 2018-06-23 NOTE — Progress Notes (Signed)
Physical Therapy Discharge Summary  Patient Details  Name: Courtney Beard MRN: [id number] Date of Birth: 05/03/1951  Patient has met 58 of 14 long term goals due to improved activity tolerance, improved balance, improved postural control, improved attention, improved awareness and improved coordination.  Patient to discharge at an ambulatory level supervision>indep.   Patient's care partner is independent to provide the necessary cognitive assistance at discharge.  Reasons goals not met: na  Recommendation:  Patient will benefit from ongoing skilled PT services in outpatient setting to continue to advance safe functional mobility, address ongoing impairments in balance, awareness, recall, and activity tolerance, and minimize fall risk.  Equipment: No equipment provided  Reasons for discharge: treatment goals met  Patient/family agrees with progress made and goals achieved: Yes  PT Discharge Precautions/Restrictions- falls; somewhat impulsive Restrictions Weight Bearing Restrictions: No Pain Pain Assessment Pain Score: 4/10 bil hands, neuropathic; premedicated Vision/Perception at baseline; wears glasses at all times    Cognition Overall Cognitive Status: Impaired/Different from baseline Arousal/Alertness: Awake/alert Orientation Level: Oriented to person;Oriented to place;Oriented to time;Oriented to situation Attention: Selective Sustained Attention: Impaired Memory: Impaired Memory Impairment: Decreased short term memory;Decreased recall of new information(route finding) Awareness: Impaired Safety/Judgment: Impaired Comments: impulsive  Sensation Sensation Light Touch: Appears Intact Proprioception: Appears Intact Coordination Gross Motor Movements are Fluid and Coordinated: Yes Fine Motor Movements are Fluid and Coordinated: Yes Finger Nose Finger Test: wNLs Heel Shin Test: WNLs bil Motor  Motor Motor: Other (comment) Motor - Skilled Clinical Observations:  generalized weakness  Motor - Discharge Observations: generalized weakness  Mobility Bed Mobility Bed Mobility: Rolling Right;Rolling Left;Right Sidelying to Sit;Left Sidelying to Sit Rolling Right: Independent Rolling Left: Independent Right Sidelying to Sit: Independent Left Sidelying to Sit: Independent Supine to Sit: Independent Sit to Supine: Independent Transfers Transfers: Stand Pivot Transfers Sit to Stand: Independent Stand to Sit: Independent Stand Pivot Transfers: Independent Transfer (Assistive device): None Locomotion  Gait Ambulation: Yes Gait Assistance: Independent Gait Distance (Feet): 200 Feet Assistive device: None Gait Gait: Yes Gait Pattern: Impaired Gait Pattern: Decreased trunk rotation;Narrow base of support Stairs / Additional Locomotion Stairs: Yes Stairs Assistance: Supervision/Verbal cueing Stair Management Technique: Two rails Number of Stairs: 12 Height of Stairs: 6(and 3) Wheelchair Mobility Wheelchair Mobility: No  Trunk/Postural Assessment  Cervical Assessment Cervical Assessment: Within Functional Limits Thoracic Assessment Thoracic Assessment: Within Functional Limits Lumbar Assessment Lumbar Assessment: Within Functional Limits Postural Control Postural Control: Deficits on evaluation Righting Reactions: and protective reactions delayed ; pt often keeps hands in pockets, from habit  Balance Balance Balance Assessed: Yes(given external perturbations, delayed hip and stepping strategies) Standardized Balance Assessment Standardized Balance Assessment: Berg Balance Test Berg Balance Test(06/20/18) Sit to Stand: Able to stand without using hands and stabilize independently Standing Unsupported: Able to stand safely 2 minutes Sitting with Back Unsupported but Feet Supported on Floor or Stool: Able to sit safely and securely 2 minutes Stand to Sit: Sits safely with minimal use of hands Transfers: Able to transfer safely, minor use of  hands Standing Unsupported with Eyes Closed: Able to stand 10 seconds with supervision Standing Ubsupported with Feet Together: Able to place feet together independently and stand 1 minute safely From Standing, Reach Forward with Outstretched Arm: Can reach forward >12 cm safely (5") From Standing Position, Pick up Object from Floor: Able to pick up shoe safely and easily From Standing Position, Turn to Look Behind Over each Shoulder: Looks behind from both sides and weight shifts well Turn 360 Degrees: Able to turn  360 degrees safely in 4 seconds or less Standing Unsupported, Alternately Place Feet on Step/Stool: Able to complete >2 steps/needs minimal assist Standing Unsupported, One Foot in Front: Able to plae foot ahead of the other independently and hold 30 seconds Standing on One Leg: Able to lift leg independently and hold > 10 seconds Total Score: 50 Static Sitting Balance Static Sitting - Balance Support: No upper extremity supported;Feet supported Static Sitting - Level of Assistance: 7: Independent Dynamic Sitting Balance Dynamic Sitting - Balance Support: No upper extremity supported;Feet supported;During functional activity Dynamic Sitting - Level of Assistance: 7: Independent Dynamic Sitting - Balance Activities: Reaching for objects Sitting balance - Comments: floor Static Standing Balance Static Standing - Balance Support: No upper extremity supported;During functional activity Static Standing - Level of Assistance: 7: Independent Dynamic Standing Balance Dynamic Standing - Balance Support: During functional activity;No upper extremity supported Dynamic Standing - Level of Assistance: 5: Stand by assistance Dynamic Standing - Balance Activities: Reaching for objects;Forward lean/weight shifting Functional Gait  Assessment Gait assessed : score 28 (06/23/18) Extremity Assessment      RLE Assessment RLE Assessment: Exceptions to [facility] General Strength Comments: grossly in  sitting: hip flex 4+/5; knee 5/5 , ankle DF 4/5 LLE Assessment LLE Assessment: Within Functional Limits General Strength Comments: ankle DF 4/5    , 06/24/2018, 12:26 PM

## 2018-06-23 NOTE — Progress Notes (Signed)
Speech Language Pathology Daily Session Note  Patient Details  Name: Leonides Schanzlisabetta M Newberry MRN: [id number] Date of Birth: 1951/06/06  Today's Date: 06/23/2018 SLP Individual Time: 1040-1130 SLP Individual Time Calculation (min): 50 min  Short Term Goals: Week 1: SLP Short Term Goal 1 (Week 1): Patient will consume current diet with minimal overt s/s of aspiration with Min A verbal cues for use of swallowing compensatory strategies.  SLP Short Term Goal 2 (Week 1): Patient will consume trials of thin liquids with minimal overt s/s of aspiration over 2 sessions to assess readiness for repeat objective swallow assessment.  SLP Short Term Goal 3 (Week 1): Patient will demonstrate selective attention to tasks for ~10 minutes in a mildly distracting enviornment with Mod A verbal cues for redirection.  SLP Short Term Goal 4 (Week 1): Patient will demonstrate functional problem solving for basic and familira tasks with Mod A verbal cues.  SLP Short Term Goal 5 (Week 1): Patient will utilize external aids to recall new, daily information with Mod A verbal cues.   Skilled Therapeutic Interventions:Skilled ST services focused on swallow and cognitive skills. Pt's son and husband present for most of the session. SLP facilitated PO consumption of thin liquid via cup, following oral care, Pt demonstrated intermittent throat clear. SLP educated pt about scheduled MBS tomorrow, pt was extremely frustrated that it was not today, SLP provided emotional support. SLP facilitated use of RMT to increase vocal intensity, pt returned demonstration preforming x30 set at level 45 cm H2O rating 7 out 10 for self perceived effort. SLP facilitated problem solving with novel card task, pt required mod A fade to min A verbal cues for problem solving at basic level and mod A verbal cues at more complex level. Pt required mod A verbal cues to recall rules, however required max-mod A verbal cues to recall daily events such as  therapeutic task and therapist names. SLP educated pt on how to read schedule to aid in recall of day events. Pt was left in room with call bell within reach and bed alarm set. SLP reccomends to continue skilled services.     Pain Pain Assessment Pain Score: 0-No pain  Therapy/Group: Individual Therapy  Phuc Kluttz  [facility] System[facility] 06/23/2018, 2:31 PM

## 2018-06-23 NOTE — Progress Notes (Signed)
Physical Therapy Session Note  Patient Details  Name: Courtney Beard MRN: [id number] Date of Birth: [birth year]  Today's Date: 06/23/2018 PT Individual Time: 8138-8719 PT Individual Time Calculation (min): 53 min   Short Term Goals: Week 1:  PT Short Term Goal 1 (Week 1): = LTGs due to ELOS  Skilled Therapeutic Interventions/Progress Updates:    no c/o pain but reports fatigue.  Session focus on balance and cognition.    Pt recalls swallow precautions for water protocol without cues.  All functional mobility throughout session with supervision or better.  Pt recalls steps to make pasta with canned sauce without cues.  PT administered FGA (see below for details).  Pt scored 28/30 on FGA, and PT provided education on results and recommendations for increased caution with ambulation in distracting environments.  Kinetron from seated level at 15 cm/s 3x20 cycles for LE strengthening.  Kinetron sit<>stand focus on equal weight bearing through LEs at 30 cm/s for 3x10 trials, rest breaks between trials as needed.  Path finding back to room with min cues for locating external aid to find room, pt able to recall room number without cues.  Returned to room and positioned with call bell in reach and needs met.   Therapy Documentation Precautions:  Precautions Precautions: Fall Precaution Comments: telesitter  Restrictions Weight Bearing Restrictions: No  Balance: Standardized Balance Assessment Standardized Balance Assessment: Functional Gait Assessment Functional Gait  Assessment Gait Level Surface: Walks 20 ft in less than 5.5 sec, no assistive devices, good speed, no evidence for imbalance, normal gait pattern, deviates no more than 6 in outside of the 12 in walkway width. Change in Gait Speed: Able to smoothly change walking speed without loss of balance or gait deviation. Deviate no more than 6 in outside of the 12 in walkway width. Gait with Horizontal Head Turns: Performs head turns  smoothly with no change in gait. Deviates no more than 6 in outside 12 in walkway width Gait with Vertical Head Turns: Performs task with slight change in gait velocity (eg, minor disruption to smooth gait path), deviates 6 - 10 in outside 12 in walkway width or uses assistive device Gait and Pivot Turn: Pivot turns safely within 3 sec and stops quickly with no loss of balance. Step Over Obstacle: Is able to step over 2 stacked shoe boxes taped together (9 in total height) without changing gait speed. No evidence of imbalance. Gait with Narrow Base of Support: Ambulates 7-9 steps. Gait with Eyes Closed: Walks 20 ft, no assistive devices, good speed, no evidence of imbalance, normal gait pattern, deviates no more than 6 in outside 12 in walkway width. Ambulates 20 ft in less than 7 sec. Ambulating Backwards: Walks 20 ft, no assistive devices, good speed, no evidence for imbalance, normal gait Steps: Alternating feet, no rail. Total Score: 28    Therapy/Group: Individual Therapy  Michel Santee 06/23/2018, 3:25 PM

## 2018-06-23 NOTE — Plan of Care (Signed)
  Problem: Consults Goal: RH GENERAL PATIENT EDUCATION Description See Patient Education module for education specifics. Outcome: Progressing Goal: Skin Care Protocol Initiated - if Braden Score 18 or less Description If consults are not indicated, leave blank or document N/A Outcome: Progressing Goal: Nutrition Consult-if indicated Outcome: Progressing   Problem: RH BOWEL ELIMINATION Goal: RH STG MANAGE BOWEL WITH ASSISTANCE Description STG Manage Bowel with min Assistance.  Outcome: Progressing Goal: RH STG MANAGE BOWEL W/MEDICATION W/ASSISTANCE Description STG Manage Bowel with Medication with min Assistance.  Outcome: Progressing   Problem: RH BLADDER ELIMINATION Goal: RH STG MANAGE BLADDER WITH ASSISTANCE Description STG Manage Bladder With min Assistance  Outcome: Progressing   Problem: RH SKIN INTEGRITY Goal: RH STG SKIN FREE OF INFECTION/BREAKDOWN Outcome: Progressing   Problem: RH SAFETY Goal: RH STG ADHERE TO SAFETY PRECAUTIONS W/ASSISTANCE/DEVICE Description STG Adhere to Safety Precautions With min  Assistance/Device.  Outcome: Progressing Goal: RH STG DECREASED RISK OF FALL WITH ASSISTANCE Description STG Decreased Risk of Fall With min Assistance.  Outcome: Progressing   Problem: RH PAIN MANAGEMENT Goal: RH STG PAIN MANAGED AT OR BELOW PT'S PAIN GOAL Outcome: Progressing   Problem: RH KNOWLEDGE DEFICIT GENERAL Goal: RH STG INCREASE KNOWLEDGE OF SELF CARE AFTER HOSPITALIZATION Outcome: Progressing   Problem: RH Vision Goal: RH LTG Vision (Specify) Outcome: Progressing

## 2018-06-23 NOTE — Progress Notes (Signed)
Occupational Therapy Session Note  Patient Details  Name: Courtney Beard MRN: [id number] Date of Birth: [birth year]  Today's Date: 06/23/2018 OT Individual Time: [id number] OT Individual Time Calculation (min): 70 min    Short Term Goals: Week 1:  OT Short Term Goal 1 (Week 1): STG=LTG d/t ELOS  Skilled Therapeutic Interventions/Progress Updates:    Upon entering the room, pt supine in bed with RN exiting the room. Pt requesting water per protocol. Pt ambulating to sink for oral care with supervision overall and then seated for water. Pt performing intermittent throat clear with min A and no coughing noted. Pt ambulating into bathroom without use of AD to shower with supervision. Pt bathing while standing with intermittent supervision for safety. Pt needing min verbal cuing for safety awareness with sit <>stand during LB clothing management. Pt returning to bed at end of session secondary to fatigue.Pt reviewing schedule with OT and calling family member to give schedule correctly without cuing needed. Bed alarm activated and call bell within reach.   Therapy Documentation Precautions:  Precautions Precautions: Fall Precaution Comments: telesitter  Restrictions Weight Bearing Restrictions: No Pain: Pain Assessment Pain Scale: 0-10 Pain Score: 8  Pain Type: Neuropathic pain Pain Location: Hand Pain Orientation: Right;Left Pain Descriptors / Indicators: Burning;Constant Pain Frequency: Constant Pain Onset: On-going Pain Intervention(s): Medication (See eMAR);MD notified (Comment) ADL: ADL Upper Body Bathing: Contact guard Where Assessed-Upper Body Bathing: Shower Lower Body Bathing: Contact guard Where Assessed-Lower Body Bathing: Shower Upper Body Dressing: Setup Where Assessed-Upper Body Dressing: Wheelchair Lower Body Dressing: Contact guard Where Assessed-Lower Body Dressing: Wheelchair Toileting: Minimal assistance Where Assessed-Toileting: Teacher, adult educationToilet Toilet Transfer:  CuratorMinimal assistance Toilet Transfer Method: ProofreaderAmbulating Toilet Transfer Equipment: AcupuncturistGrab bars Walk-In Shower Transfer: Insurance underwriterMinimal assistance Walk-In Shower Transfer Method: Designer, industrial/productAmbulating Walk-In Shower Equipment: Manufacturing systems engineerTransfer tub bench Vision   Perception    Praxis   Exercises:   Other Treatments:     Therapy/Group: Individual Therapy  Alen BleacherBradsher, Burley [place] P 06/23/2018, 9:10 AM

## 2018-06-23 NOTE — Progress Notes (Signed)
[place] PHYSICAL MEDICINE & REHABILITATION PROGRESS NOTE   Subjective/Complaints: "I'm ready to go home." complains of hand pain/swelling  ROS: Patient denies fever, rash, sore throat, blurred vision, nausea, vomiting, diarrhea, cough, shortness of breath or chest pain,   back pain, headache, or mood change.    Objective:   No results found. Recent Labs    06/22/18 0547  WBC 7.2  HGB 8.4*  HCT 26.7*  PLT 439*   Recent Labs    06/22/18 0547  NA 136  K 3.7  CL 105  CO2 25  GLUCOSE 94  BUN 15  CREATININE 0.77  CALCIUM 9.1    Intake/Output Summary (Last 24 hours) at 06/23/2018 0855 Last data filed at 06/23/2018 0753 Gross per 24 hour  Intake 750 ml  Output -  Net 750 ml     Physical Exam: Vital Signs Blood pressure 131/85, pulse 94, temperature 97.7 F (36.5 C), temperature source Oral, resp. rate 18, height 5\' 3"  (1.6 m), weight 45.2 kg, SpO2 95 %.   Constitutional: No distress . Vital signs reviewed. HEENT: EOMI, oral membranes moist Neck: supple Cardiovascular: RRR without murmur. No JVD    Respiratory: CTA Bilaterally without wheezes or rales. Normal effort    GI: BS +, non-tender, non-distended  Skin: No evidence of breakdown, no evidence of rash Neurologic: Cranial nerves II through XII intact, motor strength is 5/5 in bilateral deltoid, bicep, tricep, grip, hip flexor, knee extensors, ankle dorsiflexor and plantar flexor -fair insight and awareness Sensory exam normal sensation to light touch and proprioception in bilateral upper and lower extremities Cerebellar exam normal finger to nose to finger as well as heel to shin in bilateral upper and lower extremities Musculoskeletal: mild arthritic changes in hands, no swelling.  Psych: pleasant and cooperative  Assessment/Plan: 1. 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  Physiatrist  is providing close team supervision and 24 hour management of active medical problems listed below.  Physiatrist and rehab team continue to assess barriers to discharge/monitor patient progress toward functional and medical goals  Care Tool:  Bathing    Body parts bathed by patient: Right arm, Left arm, Chest, Abdomen, Right upper leg, Buttocks, Front perineal area, Left upper leg, Right lower leg, Left lower leg, Face         Bathing assist Assist Level: Supervision/Verbal cueing     Upper Body Dressing/Undressing Upper body dressing   What is the patient wearing?: Pull over shirt, Bra    Upper body assist Assist Level: Supervision/Verbal cueing    Lower Body Dressing/Undressing Lower body dressing      What is the patient wearing?: Underwear/pull up, Pants     Lower body assist Assist for lower body dressing: Supervision/Verbal cueing     Toileting Toileting    Toileting assist Assist for toileting: Supervision/Verbal cueing     Transfers Chair/bed transfer  Transfers assist  Chair/bed transfer activity did not occur: N/A  Chair/bed transfer assist level: Set up assist     Locomotion Ambulation   Ambulation assist      Assist level: Set up assist   Max distance: 150   Walk 10 feet activity   Assist     Assist level: Set up assist Assistive device: Hand held assist   Walk 50 feet activity   Assist    Assist level: Set up assist Assistive device: Hand held assist    Walk 150  feet activity   Assist    Assist level: Set up assist      Walk 10 feet on uneven surface  activity   Assist Walk 10 feet on uneven surfaces activity did not occur: Safety/medical concerns(fatigue)   Assist level: Set up assist     Wheelchair     Assist Will patient use wheelchair at discharge?: No   Wheelchair activity did not occur: Safety/medical concerns(fatigue)         Wheelchair 50 feet with 2 turns activity    Assist             Wheelchair 150 feet activity     Assist          Medical Problem List and Plan: 1.Debilitysecondary to acute toxic metabolic encephalopathy/septic shock/cardiac arrest with acute respiratory failure after suspected intentional drug overdose -Continue CIR therapies including PT, OT, and SLP  --Interdisciplinary Team Conference today    2. DVT Prophylaxis/Anticoagulation:   Lovenox 3. Pain Management:Tylenol as needed 4. Mood:Lexapro 20 mg dailyKlonopin 0.5 mg twice a day as needed -team to provide ego support as necessary -neuropsych eval 5. Neuropsych: This patientiscapable of making decisions on herown behalf. 6. Skin/Wound Care:Routine skin checks 7. Fluids/Electrolytes/Nutrition:   -continue IV fluids at night until p.o. intake improves   -added megace for appetite 11/12   -spoke with pt re: need fo rincreased po   8.Dysphagia. Dysphagia #3honey thick liquids.Monitor hydration -encourage PO, ? repeat modified this week 9. Tachycardia: due to deconditioning  -improved on exam today---80's to 90's  -observe for now, consider BB  -encourage fluids     LOS: 4 days A FACE TO FACE EVALUATION WAS PERFORMED  Ranelle Oyster 06/23/2018, 8:55 AM

## 2018-06-24 ENCOUNTER — Encounter (HOSPITAL_COMMUNITY): Payer: Self-pay | Admitting: Psychology

## 2018-06-24 ENCOUNTER — Inpatient Hospital Stay (HOSPITAL_COMMUNITY): Payer: Self-pay | Admitting: Speech Pathology

## 2018-06-24 ENCOUNTER — Encounter (HOSPITAL_COMMUNITY): Payer: Self-pay | Admitting: Speech Pathology

## 2018-06-24 ENCOUNTER — Inpatient Hospital Stay (HOSPITAL_COMMUNITY): Payer: Self-pay | Admitting: Physical Therapy

## 2018-06-24 ENCOUNTER — Inpatient Hospital Stay (HOSPITAL_COMMUNITY): Payer: Self-pay | Admitting: Occupational Therapy

## 2018-06-24 ENCOUNTER — Inpatient Hospital Stay (HOSPITAL_COMMUNITY): Payer: Medicare HMO

## 2018-06-24 ENCOUNTER — Inpatient Hospital Stay (HOSPITAL_COMMUNITY): Payer: Self-pay

## 2018-06-24 MED ORDER — FAMOTIDINE 20 MG PO TABS
20.0000 mg | ORAL_TABLET | Freq: Every day | ORAL | Status: DC
  Administered 2018-06-24 – 2018-06-25 (×2): 20 mg via ORAL
  Filled 2018-06-24 (×2): qty 1

## 2018-06-24 MED ORDER — DICLOFENAC SODIUM 1 % TD GEL
2.0000 g | Freq: Three times a day (TID) | TRANSDERMAL | Status: DC
  Administered 2018-06-24 – 2018-06-25 (×4): 2 g via TOPICAL
  Filled 2018-06-24: qty 100

## 2018-06-24 MED ORDER — POTASSIUM CHLORIDE CRYS ER 20 MEQ PO TBCR
40.0000 meq | EXTENDED_RELEASE_TABLET | Freq: Two times a day (BID) | ORAL | Status: DC
  Administered 2018-06-24 – 2018-06-25 (×3): 40 meq via ORAL
  Filled 2018-06-24 (×3): qty 2

## 2018-06-24 NOTE — Progress Notes (Signed)
Speech Language Pathology Daily Session Note  Patient Details  Name: Leonides Schanzlisabetta M Dowdle MRN: [id number] Date of Birth: [birth year]  Today's Date: 06/24/2018 SLP Individual Time: 0830-0900 SLP Individual Time Calculation (min): 30 min  Short Term Goals: Week 1: SLP Short Term Goal 1 (Week 1): Patient will consume current diet with minimal overt s/s of aspiration with Min A verbal cues for use of swallowing compensatory strategies.  SLP Short Term Goal 2 (Week 1): Patient will consume trials of thin liquids with minimal overt s/s of aspiration over 2 sessions to assess readiness for repeat objective swallow assessment.  SLP Short Term Goal 3 (Week 1): Patient will demonstrate selective attention to tasks for ~10 minutes in a mildly distracting enviornment with Mod A verbal cues for redirection.  SLP Short Term Goal 4 (Week 1): Patient will demonstrate functional problem solving for basic and familira tasks with Mod A verbal cues.  SLP Short Term Goal 5 (Week 1): Patient will utilize external aids to recall new, daily information with Mod A verbal cues.   Skilled Therapeutic Interventions: Skilled treatment session focused on dysphagia and cognition. SLP facilitated session by providing skilled observation of pt consuming thin water via straw. No overt s/s of aspiration observed at bedside. MBS scheduled for this day. Education provided on MBS and discharge plan for Outpatient ST to target cognition, vocal quality and ENT follow up. Son present and both voiced understanding. All question answered to pt and son's satisfaction.      Pain Pain Assessment Pain Scale: 0-10 Pain Score: 0-No pain  Therapy/Group: Individual Therapy  Ivannia Willhelm 06/24/2018, 9:44 AM

## 2018-06-24 NOTE — Progress Notes (Signed)
Modified Barium Swallow Progress Note  Patient Details  Name: Courtney Beard MRN: [id number] Date of Birth: [birth year]  Today's Date: 06/24/2018  Modified Barium Swallow completed.  Full report located under Chart Review in the Imaging Section.  Brief recommendations include the following:  Clinical Impression  Pt demonstrates appropriate oropharyngeal abilities when consuming thin liquids (via large cup sips, consecutive cup sips and consecutive straw sips). No penetration or aspiration obsrved. Pt also consumed barium tablet with thin liquids via straw with no penetration or aspiration. Pt is appropriate for upgrade to thin liquids, medicine whole with thins as well. Education provided to pt and her son on results.    Continue to recommend ENT visit to assess etiology of hoarseness.    Swallow Evaluation Recommendations   Recommended Consults: Consider ENT evaluation(Pt continues to be hoarse - recommend ENT)   SLP Diet Recommendations: Regular solids;Thin liquid   Liquid Administration via: Cup;Straw   Medication Administration: Whole meds with liquid   Supervision: Patient able to self feed   Compensations: Minimize environmental distractions;Slow rate;Small sips/bites   Postural Changes: Remain semi-upright after after feeds/meals (Comment)   Oral Care Recommendations: Oral care BID        Courtney Beard 06/24/2018,9:39 AM
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
## 2018-06-24 NOTE — Progress Notes (Signed)
[place] PHYSICAL MEDICINE & REHABILITATION PROGRESS NOTE   Subjective/Complaints: Anxious to get home. Hands still feel "swollen". Some tenderness  ROS: Patient denies fever, rash, sore throat, blurred vision, nausea, vomiting, diarrhea, cough, shortness of breath or chest pain,   headache, or mood change.    Objective:   No results found. Recent Labs    06/22/18 0547  WBC 7.2  HGB 8.4*  HCT 26.7*  PLT 439*   Recent Labs    06/22/18 0547  NA 136  K 3.7  CL 105  CO2 25  GLUCOSE 94  BUN 15  CREATININE 0.77  CALCIUM 9.1    Intake/Output Summary (Last 24 hours) at 06/24/2018 0904 Last data filed at 06/24/2018 0811 Gross per 24 hour  Intake 600 ml  Output -  Net 600 ml     Physical Exam: Vital Signs Blood pressure 124/74, pulse 86, temperature 99.4 F (37.4 C), temperature source Oral, resp. rate 18, height 5\' 3"  (1.6 m), weight 45.3 kg, SpO2 99 %.   Constitutional: No distress . Vital signs reviewed. HEENT: EOMI, oral membranes moist Neck: supple Cardiovascular: RRR without murmur. No JVD    Respiratory: CTA Bilaterally without wheezes or rales. Normal effort    GI: BS +, non-tender, non-distended  Neurologic: Cranial nerves II through XII intact, motor strength is 5/5 in bilateral deltoid, bicep, tricep, grip, hip flexor, knee extensors, ankle dorsiflexor and plantar flexor -fair insight and awareness ?decreased LT in fingers?  Musculoskeletal: mild arthritic changes in hands, no swelling. ---no change Psych: pleasant and cooperative  Assessment/Plan: 1. 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  Physiatrist is providing close team supervision and 24 hour management of active medical problems listed below.  Physiatrist and rehab team continue to assess barriers to discharge/monitor patient progress toward functional and medical  goals  Care Tool:  Bathing    Body parts bathed by patient: Right arm, Left arm, Chest, Abdomen, Right upper leg, Buttocks, Front perineal area, Left upper leg, Right lower leg, Left lower leg, Face         Bathing assist Assist Level: Supervision/Verbal cueing     Upper Body Dressing/Undressing Upper body dressing   What is the patient wearing?: Pull over shirt, Bra    Upper body assist Assist Level: Supervision/Verbal cueing    Lower Body Dressing/Undressing Lower body dressing      What is the patient wearing?: Underwear/pull up, Pants     Lower body assist Assist for lower body dressing: Supervision/Verbal cueing     Toileting Toileting    Toileting assist Assist for toileting: Supervision/Verbal cueing     Transfers Chair/bed transfer  Transfers assist  Chair/bed transfer activity did not occur: N/A  Chair/bed transfer assist level: Independent with assistive device     Locomotion Ambulation   Ambulation assist      Assist level: Independent with assistive device   Max distance: 200   Walk 10 feet activity   Assist     Assist level: Independent with assistive device Assistive device: Hand held assist   Walk 50 feet activity   Assist    Assist level: Independent with assistive device Assistive device: Hand held assist    Walk 150 feet activity   Assist    Assist level: Independent with assistive device      Walk 10 feet on uneven surface  activity   Assist Walk 10 feet on uneven  surfaces activity did not occur: Safety/medical concerns(fatigue)   Assist level: Set up assist     Wheelchair     Assist Will patient use wheelchair at discharge?: No   Wheelchair activity did not occur: Safety/medical concerns(fatigue)         Wheelchair 50 feet with 2 turns activity    Assist            Wheelchair 150 feet activity     Assist          Medical Problem List and Plan: 1.Debilitysecondary to  acute toxic metabolic encephalopathy/septic shock/cardiac arrest with acute respiratory failure after suspected intentional drug overdose -Continue CIR therapies including PT, OT, and SLP      2. DVT Prophylaxis/Anticoagulation:   Lovenox 3. Pain Management:Tylenol as needed  -add voltaren gel for hands, ?neuroropathy? 4. Mood:Lexapro 20 mg dailyKlonopin 0.5 mg twice a day as needed -team to provide ego support as necessary -neuropsych   5. Neuropsych: This patientiscapable of making decisions on herown behalf. 6. Skin/Wound Care:Routine skin checks 7. Fluids/Electrolytes/Nutrition:   -continue IV fluids at night until p.o. intake improves   -added megace for appetite 11/12---ate 75-100% yesterday      8.Dysphagia. Dysphagia #3honey thick liquids.Monitor hydration -MBS today 9. Tachycardia: due to deconditioning  -improved    -observe for now, consider BB  -encourage fluids     LOS: 5 days A FACE TO FACE EVALUATION WAS PERFORMED  Ranelle OysterZachary T  06/24/2018, 9:04 AM

## 2018-06-24 NOTE — Progress Notes (Signed)
Physical Therapy Session Note  Patient Details  Name: Courtney Beard MRN: [id number] Date of Birth: 1950-12-08  Today's Date: 06/24/2018 PT Individual Time: 1100-1200 PT Individual Time Calculation (min): 60 min   Short Term Goals: Week 1:  PT Short Term Goal 1 (Week 1): = LTGs due to ELOS  Skilled Therapeutic Interventions/Progress Updates:   Pt reclined in recliner, hood up on her sweatshirt.  She did not understand that she had further therapy today.    Gait in room without AD to /from toilet for contiennt voiding and washing hands at sink, wihtout LOB.  Gait training to day room, on level tile without LOB.    Exercise on NuStep at levl 4 x 5 minutes for general endurance.  High level balance activities: terminal hip extension in tall kneeling iwht hands on head, "knee walking" in tall kneeling, quadruped>< 2 point , without LOB , slightly more difficulty with L hip extension than R.  Single limb stance: L x 10 seconds x 1, R x 2-4 seconds x 2; standing heel, toe raises, zoom ball in standing.  Gait training while transporting a slide board containing 7 cups stacked in pyramid shape, without spillage x 100'.  Pt left resting in recliner, hungry for lunch, with needs at hand and son [name] present..     Therapy Documentation Precautions:  Precautions Precautions: Fall Precaution Comments: telesitter  Restrictions Weight Bearing Restrictions: No   Pain: Pain Assessment Pain Scale: 0-10 Pain Score: 4  Pain Type: Acute pain;Neuropathic pain Pain Location: Hand Pain Orientation: Right;Left Pain Descriptors / Indicators: Burning Pain Frequency: Constant Pain Onset: On-going Pain Intervention(s): Medication (See eMAR)   Therapy/Group: Individual Therapy  Laityn Bensen 06/24/2018, 12:16 PM

## 2018-06-24 NOTE — Discharge Summary (Signed)
NAME: Courtney Beard, Courtney M. MEDICAL RECORD [id number] ACCOUNT [id number] DATE OF BIRTH:[birth year] FACILITY: [facility] LOCATION: [facility] PHYSICIAN:ZACHARY SWARTZ, MD  DISCHARGE SUMMARY  DATE OF DISCHARGE:  06/25/2018  DISCHARGE DIAGNOSES: 1.  Toxic metabolic encephalopathy, septic shock.   2.  Cardiac arrest with respiratory failure after suspected intentional drug overdose. 3.  Subcutaneous Lovenox for deep venous thrombosis prophylaxis.   4.  Mood. 5.  Dysphagia.   6.  Tachycardia secondary to deconditioning.  HOSPITAL COURSE:  A 67 year old right-handed female, history of depression and alcohol use.  She lives with spouse, independent prior to admission.  Presented 05/23/2018 after being found unresponsive with empty bottles of Klonopin and Zanaflex at  bedside.  CPR was initiated.  Blood pressure 80/40, required intubation.  Cranial CT scan unremarkable for acute process.  Alcohol level 63.  Ammonia level 36.  Troponin level negative.  Lactic acid 3.21, urine drug screen positive for benzos.  CK of  508.    Echocardiogram with ejection fraction 65%, no wall motion abnormalities.  She was extubated 06/08/2018.  HOSPITAL COURSE:  Septic shock protocol.  Acute hypoxic respiratory failure.  Subcutaneous heparin for DVT prophylaxis and nasogastric tube initially in place.  Her diet was slowly advanced.  Therapy evaluations completed after followup for psychiatry  services and the patient was admitted for comprehensive rehabilitation program.  PAST MEDICAL HISTORY:  See discharge diagnoses.  SOCIAL HISTORY:  Lives with spouse, independent prior to admission.  FUNCTIONAL STATUS:  Upon admission to rehab services was minimal assist 30 feet 2 person handheld assist, minimal assist sit to stand, min mod assist with activities of daily living.  PHYSICAL EXAMINATION: VITAL SIGNS:  Blood pressure 99/62, pulse 108, temperature 98, respirations 20. GENERAL:  Alert female.  She was able to  provide her name and age.  She could not recall full events of her hospital stay.  Oriented to person, hospital and month. HEENT:  EOMs intact. NECK:  Supple, nontender, no JVD. CARDIOVASCULAR:  Rate controlled. ABDOMEN:  Soft, nontender, good bowel sounds. LUNGS:  Clear to auscultation.  REHABILITATION HOSPITAL COURSE:  The patient was admitted to inpatient rehabilitation services.  Therapies initiated on a 3-hour daily basis consisting of physical therapy, occupational therapy, speech therapy and rehabilitation nursing.  The following  issues were addressed during patient's rehabilitation stay.  Pertaining to the patient's toxic metabolic encephalopathy complicated by septic shock, cardiac arrest.  She did require intubation for a short time.  Suspect due to intentional drug overdose.   She continued to participate fully with her therapies.  She remained on Lexapro for mood stabilization.  She did have a history of alcohol use exhibited no signs of withdrawal.  Her diet was slowly advanced to a regular consistency.  Therapies initiated.  She could ambulate throughout the rehab unit.  All functional mobility requiring only supervision.  Scored 28/30 on FGA testing.  Up and down stairs supervision.  Gathered  belongings for activities of daily living and homemaking.  She did receive neuropsychological testing throughout her hospital stay.  It was advised at length discussion with husband and family the need for supervision for safety.  Outpatient ongoing with ambulatory referral for behavioral health.     The patient did extremely well, was discharged to home.  DISCHARGE MEDICATIONS:  Included Lexapro 20 mg p.o. daily, Pepcid 20 mg p.o. daily, folic acid 1 mg daily, potassium chloride had been discontinued.  Thiamine 100 mg p.o. daily, Tylenol as needed.  DIET:  Regular.  The patient will follow  up with Dr. Faith Rogue at the outpatient rehab service office as advised; Dr. Sonny Masters management.  Ambulatory referral obtained with Behavioral Health  SPECIAL INSTRUCTIONS:  No driving, smoking or alcohol.  Twenty-four hour supervision to be provided by family.  AN/NUANCE D:06/24/2018 T:06/24/2018 JOB:[id number]

## 2018-06-24 NOTE — Plan of Care (Signed)
  Problem: Consults Goal: RH GENERAL PATIENT EDUCATION Description See Patient Education module for education specifics. Outcome: Progressing Goal: Skin Care Protocol Initiated - if Braden Score 18 or less Description If consults are not indicated, leave blank or document N/A Outcome: Progressing Goal: Nutrition Consult-if indicated Outcome: Progressing   Problem: RH BOWEL ELIMINATION Goal: RH STG MANAGE BOWEL WITH ASSISTANCE Description STG Manage Bowel with min Assistance.  Outcome: Progressing Goal: RH STG MANAGE BOWEL W/MEDICATION W/ASSISTANCE Description STG Manage Bowel with Medication with min Assistance.  Outcome: Progressing   Problem: RH BLADDER ELIMINATION Goal: RH STG MANAGE BLADDER WITH ASSISTANCE Description STG Manage Bladder With min Assistance  Outcome: Progressing   Problem: RH SKIN INTEGRITY Goal: RH STG SKIN FREE OF INFECTION/BREAKDOWN Outcome: Progressing   Problem: RH SAFETY Goal: RH STG ADHERE TO SAFETY PRECAUTIONS W/ASSISTANCE/DEVICE Description STG Adhere to Safety Precautions With min  Assistance/Device.  Outcome: Progressing Goal: RH STG DECREASED RISK OF FALL WITH ASSISTANCE Description STG Decreased Risk of Fall With min Assistance.  Outcome: Progressing   Problem: RH PAIN MANAGEMENT Goal: RH STG PAIN MANAGED AT OR BELOW PT'S PAIN GOAL Outcome: Progressing   Problem: RH KNOWLEDGE DEFICIT GENERAL Goal: RH STG INCREASE KNOWLEDGE OF SELF CARE AFTER HOSPITALIZATION Outcome: Progressing   Problem: RH Vision Goal: RH LTG Vision (Specify) Outcome: Progressing   

## 2018-06-24 NOTE — Care Management (Signed)
Inpatient Rehabilitation Center Individual Statement of Services  Patient Name:  Courtney Beard  Date:  06/23/2018  Welcome to the Inpatient Rehabilitation Center.  Our goal is to provide you with an individualized program based on your diagnosis and situation, designed to meet your specific needs.  With this comprehensive rehabilitation program, you will be expected to participate in at least 3 hours of rehabilitation therapies Monday-Friday, with modified therapy programming on the weekends.  Your rehabilitation program will include the following services:  Physical Therapy (PT), Occupational Therapy (OT), Speech Therapy (ST), 24 hour per day rehabilitation nursing, Therapeutic Recreaction (TR), Neuropsychology, Case Management (Social Worker), Rehabilitation Medicine, Nutrition Services and Pharmacy Services  Weekly team conferences will be held on Tuesdays to discuss your progress.  Your Social Worker will talk with you frequently to get your input and to update you on team discussions.  Team conferences with you and your family in attendance may also be held.  Expected length of stay: 7 days   Overall anticipated outcome: supervision  Depending on your progress and recovery, your program may change. Your Social Worker will coordinate services and will keep you informed of any changes. Your Social Worker's name and contact numbers are listed  below.  The following services may also be recommended but are not provided by the Inpatient Rehabilitation Center:   Driving Evaluations  Home Health Rehabiltiation Services  Outpatient Rehabilitation Services   Arrangements will be made to provide these services after discharge if needed.  Arrangements include referral to agencies that provide these services.  Your insurance has been verified to be:  [employer] Your primary doctor is:  Merri BrunetteCandace Smith  Pertinent information will be shared with your doctor and your insurance  company.  Social Worker:  LyonsLucy Royalty [place], [place] [phone] or ([phone]) [phone]   Information discussed with and copy given to patient by: Amada JupiterHOYLE, Hilma Steinhilber, 06/23/2018, 1:34 PM

## 2018-06-24 NOTE — Progress Notes (Signed)
Occupational Therapy Discharge Summary  Patient Details  Name: Courtney Beard MRN: [id number] Date of Birth: [birth year]  Today's Date: 06/24/2018 OT Individual Time: 1415-1510 OT Individual Time Calculation (min): 55 min    Patient has met 12 of 12 long term goals due to improved activity tolerance, improved balance, ability to compensate for deficits, improved attention, improved awareness and improved coordination.  Patient to discharge at overall Modified Independent level.  Patient's care partner is independent to provide the necessary cognitive assistance at discharge.     Recommendation:  Patient will benefit from ongoing skilled OT services in outpatient setting to continue to advance functional skills in the area of iADL and return to prior activities.  Equipment: No equipment provided   Reviewed options for shower seat with patient and son.   They plan to purchase a shower seat for use in the shower stall at home.  Reasons for discharge: treatment goals met  Patient/family agrees with progress made and goals achieved: Yes   Therapy session completed to include self care/shower, patient and family education and general endurance activities.  She completed a shower in stance using a grab bar at DS/I level.  She obtained her clothing and got dressed at a Mod I/I level.  Ambulation on the unit without AD DS. UE ergometer 2 x 4 mintues.  Limited endurance continues to be a barrier for return to prior activities.   Reviewed with patient and son options for shower seats, safety with HM/daily task completion, gradual return to exercise.  Emphasized need for intermittent supervision, no driving, no bike riding, safety with sharps and carrying objects.  Patient and son verbalize understanding and readiness for discharge  OT Discharge Precautions/Restrictions  Precautions Precautions: Fall General OT Amount of Missed Time: 20 Minutes Vital Signs Therapy Vitals Temp: 98.9 F  (37.2 C) Temp Source: Oral Pulse Rate: 96(retake when pt was calm) Resp: 16 BP: 115/80 Patient Position (if appropriate): Sitting Oxygen Therapy SpO2: 98 % O2 Device: Room Air Pain Pain Assessment Pain Scale: 0-10 Pain Score: 0-No pain ADL ADL Eating: Independent Where Assessed-Eating: Edge of bed Grooming: Independent Where Assessed-Grooming: Standing at sink Upper Body Bathing: Independent Where Assessed-Upper Body Bathing: Shower Lower Body Bathing: Modified independent Where Assessed-Lower Body Bathing: Shower, Other (Comment)(uses hand rail in shower ) Upper Body Dressing: Independent Where Assessed-Upper Body Dressing: Edge of bed Lower Body Dressing: Independent Where Assessed-Lower Body Dressing: Edge of bed Toileting: Independent Where Assessed-Toileting: Glass blower/designer: Programmer, applications Method: Counselling psychologist: Energy manager: Distant supervision Social research officer, government Method: Heritage manager: Education officer, environmental Vision Baseline Vision/History: No visual deficits Patient Visual Report: No change from baseline Perception  Perception: Within Functional Limits Praxis Praxis: Intact Cognition Overall Cognitive Status: Within Functional Limits for tasks assessed Arousal/Alertness: Awake/alert Orientation Level: Oriented X4 Attention: Sustained;Selective Sustained Attention: Appears intact Selective Attention: Appears intact Memory: (able to recall events of the day and plan for discharge) Sensation  grossly intact Motor    Mobility  Bed Mobility Right Sidelying to Sit: Independent Left Sidelying to Sit: Independent Supine to Sit: Independent Sit to Supine: Independent Transfers Sit to Stand: Independent Stand to Sit: Independent  Trunk/Postural Assessment     Balance Balance Balance Assessed: Yes Dynamic Sitting Balance Dynamic Sitting - Balance Support: During functional  activity Dynamic Sitting - Level of Assistance: 7: Independent Dynamic Sitting - Balance Activities: Reaching for objects Dynamic Standing Balance Dynamic Standing - Balance Support: During functional activity Dynamic Standing -  Level of Assistance: 7: Independent Dynamic Standing - Balance Activities: Reaching for objects Extremity/Trunk Assessment RUE Assessment RUE Assessment: Within Functional Limits General Strength Comments: 4+/5 LUE Assessment LUE Assessment: Within Functional Limits General Strength Comments: 4+/5   Carlos Levering 06/24/2018, 4:37 PM

## 2018-06-24 NOTE — Discharge Summary (Signed)
Discharge summary job [phone]

## 2018-06-24 NOTE — Progress Notes (Signed)
Physical Therapy Session Note  Patient Details  Name: Courtney Beard [name]: [id number] Date of Birth: [birth year]  Today's Date: 06/24/2018 PT Individual Time: 1005-1030 PT Individual Time Calculation (min): 25 min   Short Term Goals: Week 1:  PT Short Term Goal 1 (Week 1): = LTGs due to ELOS  Skilled Therapeutic Interventions/Progress Updates:  Pt received in room with son present but not attending session. No c/o pain reported. Pt ambulates around unit without AD & supervision with lateral sway & inconsistent step length & width with pt requiring max cuing for pathfinding. Pt completes car transfer at simulated SUV height with supervision and negotiates uneven surface & ramp without AD & supervision. Pt negotiates stairs with cuing to use B rails but pt impulsively attempting without BUE support and pt experienced impaired foot clearance and LOB but pt able to correct herself and prevent fall with min assist from therapist. Pt then negotiates 12 steps with B rails and supervision. Pt utilized nu-step on level 5 x 5 minutes with task focusing on endurance training & BLE strengthening. At end of session pt left in room with son present to supervise. Educated pt's son on need for pt to have BUE support on rails when negotiating stairs.  Therapy Documentation Precautions:  Precautions Precautions: Fall Precaution Comments: telesitter  Restrictions Weight Bearing Restrictions: No    Therapy/Group: Individual Therapy  Sandi MariscalVictoria M Anadelia Kintz 06/24/2018, 10:33 AM

## 2018-06-24 NOTE — Patient Care Conference (Signed)
Inpatient RehabilitationTeam Conference and Plan of Care Update Date: 06/23/2018   Time: 2:05 PM    Patient Name: Courtney Beard      Medical Record Number: [id number]  Date of Birth: August 11, 1951 Sex: Female         Room/Bed: 4W12C/4W12C-01 Payor Info: Payor: [employer] MEDICARE / Plan: Monia PouchAETNA MEDICARE HMO/PPO / Product Type: *No Product type* /    Admitting Diagnosis: Debility  Admit Date/Time:  06/19/2018  1:55 PM Admission Comments: No comment available   Primary Diagnosis:  <principal problem not specified> Principal Problem: <principal problem not specified>  Patient Active Problem List   Diagnosis Date Noted  . Metabolic encephalopathy 06/19/2018  . Depression   . Alcohol abuse   . Leukocytosis   . Acute blood loss anemia   . MDD (major depressive disorder), recurrent severe, without psychosis (HCC)   . Overdose 05/23/2018  . Acute hypoxemic respiratory failure (HCC)   . Aspiration pneumonia of right lung due to gastric secretions [facility](HCC)     Expected Discharge Date: Expected Discharge Date: 06/25/18  Team Members Present: Physician leading conference: Dr. Faith RogueZachary Swartz Social Worker Present: Amada JupiterLucy Fernado Brigante, LCSW Nurse Present: Willey BladeKaren Winter, RN PT Present: Teodoro Kilaitlin Penven-Crew, PT OT Present: Callie FieldingKatie Pittman, OT SLP Present: Colin BentonMadison Cratch, SLP PPS Coordinator present : Tora DuckMarie Noel, RN, CRRN     Current Status/Progress Goal Weekly Team Focus  Medical   anoxic/toxic encephalopathy, debility, sepsis, dysphagia, poor po intake  improve nutrition, cognition  nutrition, volume mgt   Bowel/Bladder   Patient is continent of B/B.   Maintain continence of B.B and regular bowel pattern.   Assess for toileting needs and address as needed.    Swallow/Nutrition/ Hydration   HTL, Regular; water protocol intermittent throat clear  Supervision A  MBS tomorrow    ADL's   supervision without use of AD  supervision overall  d/c planning, balance, safety awareness, cognition    Mobility   mod I  supervision>indep  d/c planning    Communication      Mod I  RMT, vocal intensity    Safety/Cognition/ Behavioral Observations  Mod-Min A problem solving, max-mod A recall,   MIn A  basic problem solving, error awarenes, recall, selective attention   Pain   Patient complains of aching pain to bilateral hands. Given prn tylenol upon request with full effects.   Maintain pain level <3 on scale of 1-10  Assess for pain every shift and as needed. Address as needed.    Skin   No skin issues noted.   Maintain skin integrity.   Assess skin every shift and as needed.     Rehab Goals Patient on target to meet rehab goals: Yes *See Care Plan and progress notes for long and short-term goals.     Barriers to Discharge  Current Status/Progress Possible Resolutions Date Resolved   Physician    Medical stability               Nursing                  PT                    OT                  SLP                SW                Discharge Planning/Teaching  Needs:  Plan for pt to d/c home with spouse/ family providing 24/7 supervision.  teaching ongoing   Team Discussion:  Cognition; dysphagia.  Cont b/b.  Becomes slightly agitated at times.  Plan MBS tomorrow;  Min-mod basic problem solving.  Very poor memory.  Independent mobility goals.  neuropsych go assess and address d/c recommendations with pt and spouse.  Revisions to Treatment Plan:  NA    Continued Need for Acute Rehabilitation Level of Care: The patient requires daily medical management by a physician with specialized training in physical medicine and rehabilitation for the following conditions: Daily direction of a multidisciplinary physical rehabilitation program to ensure safe treatment while eliciting the highest outcome that is of practical value to the patient.: Yes Daily medical management of patient stability for increased activity during participation in an intensive rehabilitation regime.:  Yes Daily analysis of laboratory values and/or radiology reports with any subsequent need for medication adjustment of medical intervention for : Neurological problems;Cardiac problems   I attest that I was present, lead the team conference, and concur with the assessment and plan of the team.   Sherrill Buikema 06/24/2018, 11:18 AM

## 2018-06-25 ENCOUNTER — Telehealth (HOSPITAL_COMMUNITY): Payer: Self-pay | Admitting: Psychology

## 2018-06-25 MED ORDER — THIAMINE HCL 100 MG PO TABS: 100.0000 mg | ORAL_TABLET | Freq: Every day | ORAL | 0 refills | Status: DC

## 2018-06-25 MED ORDER — ACETAMINOPHEN 325 MG PO TABS: 650.0000 mg | ORAL_TABLET | Freq: Four times a day (QID) | ORAL | Status: AC | PRN

## 2018-06-25 MED ORDER — FOLIC ACID 1 MG PO TABS: 1.0000 mg | ORAL_TABLET | Freq: Every day | ORAL | 1 refills | Status: DC

## 2018-06-25 MED ORDER — ESCITALOPRAM OXALATE 20 MG PO TABS: 20.0000 mg | ORAL_TABLET | Freq: Every day | ORAL | 1 refills | Status: DC

## 2018-06-25 MED ORDER — FAMOTIDINE 20 MG PO TABS: 20.0000 mg | ORAL_TABLET | Freq: Every day | ORAL | 0 refills | Status: DC

## 2018-06-25 MED ORDER — CLONAZEPAM 0.5 MG PO TABS: 0.5000 mg | ORAL_TABLET | Freq: Two times a day (BID) | ORAL | 0 refills | Status: DC | PRN

## 2018-06-25 MED ORDER — DICLOFENAC SODIUM 1 % TD GEL: 2.0000 g | Freq: Three times a day (TID) | TRANSDERMAL | 1 refills | Status: DC

## 2018-06-25 NOTE — Discharge Instructions (Signed)
FOR SAFETY, 24 HOUR SUPERVISION IS RECOMMENDED UNTIL OTHERWISE DIRECTED BY A PHYSICIAN.  ABSOLUTELY NO DRIVING UNTIL OTHERWISE DIRECTED BY A PHYSICIAN. CAR KEYS SHOULD BE HIDDEN IN A SAFE LOCATION IF NEEDED.  DUE TO RISK OF INJURY, PLEASE REMOVE GUNS, KNIVES, OR ANY OTHER POTENTIALLY DANGEROUS OBJECTS AND HAZARDOUS MATERIALS FROM THE HOME. Inpatient Rehab Discharge Instructions  Courtney Beard Discharge date and time: No discharge date for patient encounter.   Activities/Precautions/ Functional Status: Activity: activity as tolerated Diet: regular diet Wound Care: none needed Functional status:  ___ No restrictions     ___ Walk up steps independently ___ 24/7 supervision/assistance   ___ Walk up steps with assistance ___ Intermittent supervision/assistance  ___ Bathe/dress independently ___ Walk with walker     _x__ Bathe/dress with assistance ___ Walk Independently    ___ Shower independently ___ Walk with assistance    ___ Shower with assistance ___ No alcohol     ___ Return to work/school ________   COMMUNITY REFERRALS UPON DISCHARGE:    Outpatient: PT     OT    ST                   Agency:  [facility]     Phone: [phone]                Appointment Date/Time:  11/19 @ 9:30 (please arrive by 9:15) for PT                                                                  11/20 @ 2:00 for OT                                                                  12/2   @ 9:00 for speech  Other:  [name]/19 @ 1:30 pm                                                            [address]  [phone]                                                            [phone]        Special Instructions: No driving, smoking, alcohol or illicit drug use   My questions have been answered and I understand these instructions. I will adhere to these goals and the provided educational materials after my  discharge from the hospital.  Patient/Caregiver Signature _______________________________ Date __________  Clinician Signature _______________________________________ Date __________  Please bring this form and your medication list with you to  all your follow-up doctor's appointments.

## 2018-06-25 NOTE — Progress Notes (Signed)
Patient discharged to home, accompanied by her son. 

## 2018-06-25 NOTE — Progress Notes (Addendum)
PHYSICAL MEDICINE & REHABILITATION PROGRESS NOTE   Subjective/Complaints: Anxious to get home and "have my hair done"  ROS: Patient denies fever, rash, sore throat, blurred vision, nausea, vomiting, diarrhea, cough, shortness of breath or chest pain,   back pain, headache, or mood change.    Objective:   Dg Swallowing Func-speech Pathology  Result Date: 06/24/2018 Objective Swallowing Evaluation: Type of Study: Bedside Swallow Evaluation  Patient Details Name: Courtney Beard MRN: [id number] Date of Birth: [birth year] Today's Date: 06/24/2018 Time: SLP Start Time (ACUTE ONLY): 1330 -SLP Stop Time (ACUTE ONLY): 1342 SLP Time Calculation (min) (ACUTE ONLY): 12 min Past Medical History: Past Medical History: Diagnosis Date . Acute blood loss anemia 06/2018 . Anemia  . Depression  . H/O ETOH abuse  Past Surgical History: Past Surgical History: Procedure Laterality Date . COLONOSCOPY   HPI: 67 y/o female admitted 05/23/18 due to post cardiac arrest in setting of presumed intentional overdose with benzodiazepine and muscle relaxants.  Bystander CPR initiated, has aspiration pneumonia and ARDS. ETT 10/12-28. Has cortrak.  Since extubation she has remained confused and periodically agitated.  She has copious secretions but has difficulty expectorating them effectively and has required frequent suctioning NT suctioning.  Subjective: alert, following commands Assessment / Plan / Recommendation CHL IP CLINICAL IMPRESSIONS 06/24/2018 Clinical Impression Pt demonstrates appropriate oropharyngeal abilities when consuming thin liquids (via large cup sips, consecutive cup sips and consecutive straw sips). No penetration or aspiration obsrved. Pt also consumed barium tablet with thin liquids via straw with no penetration or aspiration. Pt is appropriate for upgrade to thin liquids, medicine whole with thins as well. Education provided to pt and her son on results.  SLP Visit Diagnosis Dysphagia,  unspecified (R13.10) Attention and concentration deficit following -- Frontal lobe and executive function deficit following -- Impact on safety and function No limitations   CHL IP TREATMENT RECOMMENDATION 06/24/2018 Treatment Recommendations Therapy as outlined in treatment plan below   Prognosis 06/15/2018 Prognosis for Safe Diet Advancement Good Barriers to Reach Goals Cognitive deficits Barriers/Prognosis Comment -- CHL IP DIET RECOMMENDATION 06/24/2018 SLP Diet Recommendations Regular solids;Thin liquid Liquid Administration via Cup;Straw Medication Administration Whole meds with liquid Compensations Minimize environmental distractions;Slow rate;Small sips/bites Postural Changes Remain semi-upright after after feeds/meals (Comment)   CHL IP OTHER RECOMMENDATIONS 06/24/2018 Recommended Consults Consider ENT evaluation Oral Care Recommendations Oral care BID Other Recommendations --   CHL IP FOLLOW UP RECOMMENDATIONS 06/19/2018 Follow up Recommendations Inpatient Rehab   CHL IP FREQUENCY AND DURATION 06/15/2018 Speech Therapy Frequency (ACUTE ONLY) min 2x/week Treatment Duration 2 weeks      CHL IP ORAL PHASE 06/24/2018 Oral Phase WFL Oral - Pudding Teaspoon -- Oral - Pudding Cup -- Oral - Honey Teaspoon -- Oral - Honey Cup -- Oral - Nectar Teaspoon -- Oral - Nectar Cup -- Oral - Nectar Straw -- Oral - Thin Teaspoon -- Oral - Thin Cup -- Oral - Thin Straw -- Oral - Puree -- Oral - Mech Soft -- Oral - Regular -- Oral - Multi-Consistency -- Oral - Pill -- Oral Phase - Comment --  CHL IP PHARYNGEAL PHASE 06/24/2018 Pharyngeal Phase WFL Pharyngeal- Pudding Teaspoon -- Pharyngeal -- Pharyngeal- Pudding Cup -- Pharyngeal -- Pharyngeal- Honey Teaspoon -- Pharyngeal -- Pharyngeal- Honey Cup -- Pharyngeal -- Pharyngeal- Nectar Teaspoon -- Pharyngeal -- Pharyngeal- Nectar Cup -- Pharyngeal -- Pharyngeal- Nectar Straw -- Pharyngeal -- Pharyngeal- Thin Teaspoon -- Pharyngeal -- Pharyngeal- Thin Cup -- Pharyngeal -- Pharyngeal-  Thin Straw -- Pharyngeal --  Pharyngeal- Puree -- Pharyngeal -- Pharyngeal- Mechanical Soft -- Pharyngeal -- Pharyngeal- Regular -- Pharyngeal -- Pharyngeal- Multi-consistency -- Pharyngeal -- Pharyngeal- Pill -- Pharyngeal -- Pharyngeal Comment --  CHL IP CERVICAL ESOPHAGEAL PHASE 06/24/2018 Cervical Esophageal Phase WFL Pudding Teaspoon -- Pudding Cup -- Honey Teaspoon -- Honey Cup -- Nectar Teaspoon -- Nectar Cup -- Nectar Straw -- Thin Teaspoon -- Thin Cup -- Thin Straw -- Puree -- Mechanical Soft -- Regular -- Multi-consistency -- Pill -- Cervical Esophageal Comment -- Courtney Beard 06/24/2018, 9:43 AM              No results for input(s): WBC, HGB, HCT, PLT in the last 72 hours. No results for input(s): NA, K, CL, CO2, GLUCOSE, BUN, CREATININE, CALCIUM in the last 72 hours.  Intake/Output Summary (Last 24 hours) at 06/25/2018 1001 Last data filed at 06/24/2018 1900 Gross per 24 hour  Intake 120 ml  Output -  Net 120 ml     Physical Exam: Vital Signs Blood pressure (!) 111/55, pulse (!) 106, temperature 98 F (36.7 C), temperature source Oral, resp. rate 19, height 5\' 3"  (1.6 m), weight 45.3 kg, SpO2 96 %.   Constitutional: No distress . Vital signs reviewed. HEENT: EOMI, oral membranes moist Neck: supple Cardiovascular: RRR without murmur. No JVD    Respiratory: CTA Bilaterally without wheezes or rales. Normal effort    GI: BS +, non-tender, non-distended  Neurologic: Cranial nerves II through XII intact, motor strength is 5/5 in bilateral deltoid, bicep, tricep, grip, hip flexor, knee extensors, ankle dorsiflexor and plantar flexor Improving insight and awareness ?decreased LT in fingers?---no change  Musculoskeletal: mild arthritic changes in hands, no swelling. --  Psych: pleasant and cooperative  Assessment/Plan: 1. 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  Physiatrist is providing close team supervision and 24 hour management of active medical problems listed below.  Physiatrist and rehab team continue to assess barriers to discharge/monitor patient progress toward functional and medical goals  Care Tool:  Bathing    Body parts bathed by patient: Right arm, Left arm, Chest, Abdomen, Front perineal area, Buttocks, Right upper leg, Left upper leg, Right lower leg, Left lower leg, Face         Bathing assist Assist Level: Independent     Upper Body Dressing/Undressing Upper body dressing   What is the patient wearing?: Bra, Pull over shirt    Upper body assist Assist Level: Independent    Lower Body Dressing/Undressing Lower body dressing      What is the patient wearing?: Underwear/pull up, Pants     Lower body assist Assist for lower body dressing: Independent     Toileting Toileting    Toileting assist Assist for toileting: Supervision/Verbal cueing     Transfers Chair/bed transfer  Transfers assist  Chair/bed transfer activity did not occur: N/A  Chair/bed transfer assist level: Independent with assistive device     Locomotion Ambulation   Ambulation assist      Assist level: Supervision/Verbal cueing Assistive device: (none) Max distance: 150 ft    Walk 10 feet activity   Assist     Assist level: Supervision/Verbal cueing Assistive device: Hand held assist   Walk 50 feet activity   Assist    Assist level: Supervision/Verbal cueing Assistive device: Hand held assist    Walk 150 feet activity   Assist    Assist level: Supervision/Verbal cueing  Walk 10 feet on uneven surface  activity   Assist Walk 10 feet on uneven surfaces activity did not occur: Safety/medical concerns(fatigue)   Assist level: Supervision/Verbal cueing Assistive device: (none)   Wheelchair     Assist Will patient use wheelchair at discharge?: No   Wheelchair activity did not occur:  Safety/medical concerns(fatigue)         Wheelchair 50 feet with 2 turns activity    Assist            Wheelchair 150 feet activity     Assist          Medical Problem List and Plan: 1.Debilitysecondary to acute toxic metabolic encephalopathy/septic shock/cardiac arrest with acute respiratory failure after suspected intentional drug overdose -Continue CIR therapies including PT, OT, and SLP  -dc home today with supervision     -Patient to see Rehab MD/provider in the office for transitional care encounter in 1-2 weeks.  2. DVT Prophylaxis/Anticoagulation:   Lovenox 3. Pain Management:Tylenol as needed  -add voltaren gel for hands---will not send home with this 4. Mood:Lexapro 20 mg dailyKlonopin 0.5 mg twice a day as needed -team to provide ego support as necessary -neuropsych eval appreciated  -pt is not a threat to harm or self at this point  -still recommend supervision at home  -outpt psychiatric follow up ordered  5. Neuropsych: This patientiscapable of making decisions on herown behalf. 6. Skin/Wound Care:Routine skin checks 7. Fluids/Electrolytes/Nutrition:   -on regular diet. Tolerating well     8.Dysphagia. Adv to reg diet    LOS: 6 days A FACE TO FACE EVALUATION WAS PERFORMED  Ranelle Oyster 06/25/2018, 10:01 AM

## 2018-06-25 NOTE — Progress Notes (Signed)
Social Work  Discharge Note  Met with pt and son yesterday to review d/c recommendations/ plans.  Pt was very eager for d/c today and agreeable with all recommendations.     The overall goal for the admission was met for:   Discharge location: Yes - home with spouse and son who will share in provision of 24/7 supervision initially.  Length of Stay: Yes - 6 days  Discharge activity level: Yes - supervision  Home/community participation: Yes  Services provided included: MD, RD, PT, OT, SLP, RN, Pharmacy, Neuropsych and SW  Financial Services: [employer] Medicare  Follow-up services arranged: Outpatient: PT, OT, ST via [facility], Other: referral to [facility] - appt 11/19 with Brandon Melnick and Patient/Family has no preference for HH/DME agencies  Comments (or additional information): As noted, recommendation per neuropsychology for pt to be evaluated/ followed at [facility] to address psychosocial issues and pt agreeable.  Appointment has been made and information provided to pt and her son.  Pt has also signed a [place] "No Harm" contract and this has been scanned into her chart documents.    Patient/Family verbalized understanding of follow-up arrangements: Yes  Individual responsible for coordination of the follow-up plan: pt  Confirmed correct DME delivered: NA Jaeger Trueheart

## 2018-06-25 NOTE — Progress Notes (Signed)
Speech Language Pathology Discharge Summary  Patient Details  Name: Courtney Beard MRN: [id number] Date of Birth: 1950/11/11  Today's Date: 06/25/2018   Patient has met 8 of 8 long term goals.  Patient to discharge at overall Supervision;Min level.   Reasons goals not met:     Clinical Impression/Discharge Summary:   Pt has made good progress in ST sessions and as a result she is currently consuming a regular diet with thin liquids without overt s/s of dysphagia or aspiration. Pt has also made progress towards her cognitive goals and is currently Min A to supervision with complex problem solving, recall of information and awareness. As such pt would benefit from Outpatient ST services to address complex problem solving related to money, medication and daily management.    Pt continues to experience a very hoarse vocal quality and would benefit from follow up ENT consult to assess vocal cords (pt with history of multiple difficult intubations).    Care Partner:  Caregiver Able to Provide Assistance: Yes  Type of Caregiver Assistance: Cognitive  Recommendation:  24 hour supervision/assistance;Outpatient SLP  Rationale for SLP Follow Up: Maximize cognitive function and independence;Maximize functional communication;Reduce caregiver burden     Reasons for discharge: Treatment goals met   Patient/Family Agrees with Progress Made and Goals Achieved: Yes    [name] 06/25/2018, 8:26 AM

## 2018-06-26 ENCOUNTER — Telehealth: Payer: Self-pay

## 2018-06-26 NOTE — Telephone Encounter (Signed)
Transitional Care call-husband Caryn BeeKevin    1. Are you/is patient experiencing any problems since coming home? No Are there any questions regarding any aspect of care? No 2. Are there any questions regarding medications administration/dosing? No Are meds being taken as prescribed? Yes Patient should review meds with caller to confirm 3. Have there been any falls? No 4. Has Home Health been to the house and/or have they contacted you? Outpatient and has appts If not, have you tried to contact them? Can we help you contact them? 5. Are bowels and bladder emptying properly? Yes Are there any unexpected incontinence issues? Once or twice went in her pants If applicable, is patient following bowel/bladder programs? 6. Any fevers, problems with breathing, unexpected pain?No 7. Are there any skin problems or new areas of breakdown?No 8. Has the patient/family member arranged specialty MD follow up (ie cardiology/neurology/renal/surgical/etc)?Yes  Can we help arrange? 9. Does the patient need any other services or support that we can help arrange?No 10. Are caregivers following through as expected in assisting the patient?Yes 11. Has the patient quit smoking, drinking alcohol, or using drugs as recommended?Yes  Appointment time, arrive time 9:20 am for 9:40 am appt with Riley LamEunice 1st, then Dr. Riley KillSwartz [address] [phone]

## 2018-06-30 ENCOUNTER — Ambulatory Visit: Payer: Self-pay | Admitting: Physical Therapy

## 2018-06-30 ENCOUNTER — Ambulatory Visit: Payer: Medicare HMO | Admitting: Physical Therapy

## 2018-06-30 ENCOUNTER — Ambulatory Visit (INDEPENDENT_AMBULATORY_CARE_PROVIDER_SITE_OTHER): Payer: Medicare HMO | Admitting: Psychology

## 2018-06-30 ENCOUNTER — Encounter (HOSPITAL_COMMUNITY): Payer: Self-pay | Admitting: Psychology

## 2018-06-30 DIAGNOSIS — R69 Illness, unspecified: Secondary | ICD-10-CM | POA: Diagnosis not present

## 2018-06-30 DIAGNOSIS — F1020 Alcohol dependence, uncomplicated: Secondary | ICD-10-CM
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
## 2018-07-01 ENCOUNTER — Ambulatory Visit: Payer: Medicare HMO | Attending: Family Medicine | Admitting: Occupational Therapy

## 2018-07-01 DIAGNOSIS — M25541 Pain in joints of right hand: Secondary | ICD-10-CM | POA: Insufficient documentation

## 2018-07-01 DIAGNOSIS — R41844 Frontal lobe and executive function deficit: Secondary | ICD-10-CM | POA: Insufficient documentation

## 2018-07-01 DIAGNOSIS — M25542 Pain in joints of left hand: Secondary | ICD-10-CM | POA: Diagnosis not present

## 2018-07-01 DIAGNOSIS — M6281 Muscle weakness (generalized): Secondary | ICD-10-CM | POA: Diagnosis not present

## 2018-07-01 DIAGNOSIS — R2689 Other abnormalities of gait and mobility: Secondary | ICD-10-CM | POA: Insufficient documentation

## 2018-07-01 NOTE — Therapy (Signed)
[facility] [address] [place], [place], [address] Phone: [phone]   Fax:  [phone]  Occupational Therapy Evaluation  Patient Details  Name: Courtney Beard MRN: [id number] Date of Birth: 07-03-1951 Referring Provider (OT): Dr. Riley Kill   Encounter Date: 07/01/2018  OT End of Session - 07/01/18 1511    Visit Number  1    Number of Visits  17    Date for OT Re-Evaluation  08/30/18    Authorization Type  [employer]    Authorization - Visit Number  1    Authorization - Number of Visits  10    OT Start Time  1406    OT Stop Time  1446    OT Time Calculation (min)  40 min    Activity Tolerance  Patient tolerated treatment well    Behavior During Therapy  [facility] for tasks assessed/performed       Past Medical History:  Diagnosis Date  . Acute blood loss anemia 06/2018  . Anemia   . Depression   . H/O ETOH abuse     Past Surgical History:  Procedure Laterality Date  . COLONOSCOPY      There were no vitals filed for this visit.  Subjective Assessment - 07/01/18 1505    Subjective   Pt reports today is the best day she has had    Pertinent History  Toxic metabolic encephalopathy, septic shock, cardiac arrest with resp. failure after suspected intentional drug overdose, dysphagia, tachycardia due to deconditioning    Patient Stated Goals  resume prior level of function    Currently in Pain?  Yes    Pain Score  6     Pain Location  Hand    Pain Orientation  Right;Left    Pain Descriptors / Indicators  Aching    Pain Type  Chronic pain    Pain Onset  More than a month ago    Pain Frequency  Intermittent    Aggravating Factors   mornings    Pain Relieving Factors  tylenol        [facility] OT Assessment - 07/01/18 1416      Assessment   Medical Diagnosis  metabolic encephalopathy    Referring Provider (OT)  Dr. Riley Kill      Precautions   Precautions  None      Balance Screen   Has the patient  fallen in the past 6 months  No    Has the patient had a decrease in activity level because of a fear of falling?   No    Is the patient reluctant to leave their home because of a fear of falling?   No      Home  Environment   Family/patient expects to be discharged to:  Private residence    Home Access  Stairs   14 steps   Home Layout  Two level    Lives With  Spouse   husband works weekend option     Prior Function   Level of Economist  --   retired Theatre manager   Leisure  walk with dogs      ADL   Eating/Feeding  Modified independent    Grooming  Modified independent    Upper Body Bathing  Modified independent    Lower Body Bathing  Modified independent    Upper Body Dressing  Independent    Lower Body Dressing  Modified independent  LexicographerToilet Transfer  Independent    Toileting -  Hygiene  Modified Independent      IADL   Shopping  Needs to be accompanied on any shopping trip    Light Housekeeping  Performs light daily tasks such as dishwashing, bed making    Meal Prep  Able to complete simple cold meal and snack prep    Medication Management  Takes responsibility if medication is prepared in advance in seperate dosage    Financial Management  --   husband handles bill pay prior to hospitalization     Mobility   Mobility Status  Independent      Written Expression   Dominant Hand  Right    Handwriting  100% legible      Activity Tolerance   Activity Tolerance  --   Tolerates 20 mins activity prior to rest break     Cognition   Overall Cognitive Status  Impaired/Different from baseline    Attention  Selective    Selective Attention  --   impaired alternating attention   Memory  Impaired    Memory Impairment  Decreased short term memory    Problem Solving  Impaired    MOCA  22/30   difficulty with clock numbers, recalled 1/5 words    Cognition Comments  impaired alternating attention for trailmaking task      Coordination   9  Hole Peg Test  Right;Left    Right 9 Hole Peg Test  23.32 secs    Left 9 Hole Peg Test  23.94 secs      ROM / Strength   AROM / PROM / Strength  AROM;Strength      AROM   Overall AROM   Within functional limits for tasks performed      Strength   Overall Strength  Deficits    Overall Strength Comments  proximal strength grossly 4/5, distal 4+/5      Hand Function   Right Hand Grip (lbs)  23     Left Hand Grip (lbs)  25                        OT Short Term Goals - 07/01/18 1631      OT SHORT TERM GOAL #1   Title  I with HEP     Time  4    Period  Weeks    Status  New      OT SHORT TERM GOAL #2   Title  Pt will perform basic home managment tasks modified independently demonstrating good safety awareness.    Time  4    Period  Weeks    Status  New      OT SHORT TERM GOAL #3   Title  Pt will verbalize understanding of compensatory strategies for short term memory/cognitive deficits.    Time  4    Period  Weeks    Status  New      OT SHORT TERM GOAL #4   Title  Pt will perform basic cooking task with supervision demonstrating good safety awareness.    Time  4    Period  Weeks    Status  New      OT SHORT TERM GOAL #5   Title  Pt will report performing home management activities x 30 mins in standing without rest break.    Time  4    Period  Weeks    Status  New  Additional Short Term Goals   Additional Short Term Goals  Yes      OT SHORT TERM GOAL #6   Title  Pt will demonstrate ability to perform a functional organization task with no more than min v.c (plan a meal and generate a grocery list)     Time  4    Period  Weeks    Status  New        OT Long Term Goals - 07/01/18 1633      OT LONG TERM GOAL #1   Title  Pt will perform mod complex cooking and home management at a modified independent level demonstrating good safety awareness.    Time  8    Period  Weeks    Status  New    Target Date  08/30/18      OT LONG TERM GOAL #2    Title  Pt demonstate improved grip strength for ADLS as evidenced by increasing bilateral grip strength by 5lbs.    Time  8    Period  Weeks    Status  New      OT LONG TERM GOAL #3   Title  Pt will demonstrate ability to alternate attention between a physical and cognitive task with 90% or better accuracy, in prep for driving.    Time  8    Period  Weeks    Status  New      OT LONG TERM GOAL #4   Title  Pt will demonstrate improved endurance with pt reporting ability to stand x 45 mins without rest breaks for ADL/IADLS.    Time  8    Period  Weeks    Status  New      OT LONG TERM GOAL #5   Title  Pt will verbalize understanding of ways to manage hand pain and joint protection strategies prn    Time  8    Period  Weeks    Status  New            Plan - 07/01/18 1512    Clinical Impression Statement  Pt is a 67 y.o female hospitalized 05/23/18 for toxic metabolic encephalopathy, cardiac arrest with resp failure after suspected intentional overdose. Pt required intubation. Pt received therapies on CIR and she was d/c home on 06/25/18. Pt presents with the following deficits which impede performance of ADLS/ IADLS: decreased strength, decreased activity tolerance, decreased balance, cognitive deficits and hand pain. Pt can benefit from skilled occupational therapy to maximize pt's safety and independence with daily activities    Occupational Profile and client history currently impacting functional performance  PMH: toxic metabolic encephalopathy, cardiac arrest with resp failure after suspected intentional overdose, dysphagia, tachycardia due to deconditioning, depression, alcohol abuse, Pt i s retired respiratory therapist, she was completely I prior to hospitalization, pt lives with her husband( he works weekend option). She is accompanied today by her son who lives out of town.    Occupational performance deficits (Please refer to evaluation for details):   ADL's;IADL's;Leisure;Social Participation    Rehab Potential  Good    Current Impairments/barriers affecting progress:  cognitive deficits, hx of alcohol abuse, depression, overdose    OT Frequency  2x / week   plus eval   OT Duration  8 weeks    OT Treatment/Interventions  Self-care/ADL training;Therapeutic exercise;Patient/family education;Neuromuscular education;Moist Heat;Aquatic Therapy;Paraffin;Therapeutic activities;Functional Mobility Training;Energy conservation;Fluidtherapy;Cryotherapy;Ultrasound;Contrast Bath;DME and/or AE instruction;Manual Therapy;Passive range of motion;Cognitive remediation/compensation    Plan  HEP for grip  strength, compensatory strategies for short term memory deficits, decreased activity tolerance,     Clinical Decision Making  Several treatment options, min-mod task modification necessary    Consulted and Agree with Plan of Care  Family member/caregiver;Patient    Family Member Consulted  son       Patient will benefit from skilled therapeutic intervention in order to improve the following deficits and impairments:  Abnormal gait, Decreased cognition, Decreased knowledge of use of DME, Pain, Decreased mobility, Decreased activity tolerance, Decreased endurance, Decreased strength, Impaired UE functional use, Impaired perceived functional ability, Decreased safety awareness, Decreased knowledge of precautions  Visit Diagnosis: Muscle weakness (generalized) - Plan: Ot plan of care cert/re-cert  Pain in joint of left hand - Plan: Ot plan of care cert/re-cert  Pain in joint of right hand - Plan: Ot plan of care cert/re-cert  Frontal lobe and executive function deficit - Plan: Ot plan of care cert/re-cert  Other abnormalities of gait and mobility - Plan: Ot plan of care cert/re-cert    Problem List Patient Active Problem List   Diagnosis Date Noted  . Metabolic encephalopathy 06/19/2018  . Depression   . Alcohol abuse   . Leukocytosis   . Acute  blood loss anemia   . MDD (major depressive disorder), recurrent severe, without psychosis (HCC)   . Overdose 05/23/2018  . Acute hypoxemic respiratory failure (HCC)   . Aspiration pneumonia of right lung due to gastric secretions (HCC)     RINE,KATHRYN 07/02/2018, 12:49 PM  [facility] [address] [place], [place], [address] Phone: [phone]   Fax:  [phone]  Name: Courtney Beard MRN: [id number] Date of Birth: 1951/03/09
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
## 2018-07-03 ENCOUNTER — Encounter: Payer: Medicare HMO | Admitting: Registered Nurse

## 2018-07-03 DIAGNOSIS — H2513 Age-related nuclear cataract, bilateral: Secondary | ICD-10-CM | POA: Diagnosis not present

## 2018-07-07 ENCOUNTER — Telehealth (HOSPITAL_COMMUNITY): Payer: Self-pay | Admitting: Psychology

## 2018-07-07 ENCOUNTER — Encounter: Payer: Self-pay | Admitting: Registered Nurse

## 2018-07-07 ENCOUNTER — Encounter: Payer: Medicare HMO | Attending: Registered Nurse | Admitting: Registered Nurse

## 2018-07-07 VITALS — BP 117/72 | HR 65 | Resp 14 | Ht 62.0 in | Wt 97.0 lb

## 2018-07-07 DIAGNOSIS — Z87891 Personal history of nicotine dependence: Secondary | ICD-10-CM | POA: Insufficient documentation

## 2018-07-07 DIAGNOSIS — M2550 Pain in unspecified joint: Secondary | ICD-10-CM | POA: Diagnosis not present

## 2018-07-07 DIAGNOSIS — R69 Illness, unspecified: Secondary | ICD-10-CM | POA: Diagnosis not present

## 2018-07-07 DIAGNOSIS — G9341 Metabolic encephalopathy: Secondary | ICD-10-CM

## 2018-07-07 DIAGNOSIS — E079 Disorder of thyroid, unspecified: Secondary | ICD-10-CM | POA: Diagnosis not present

## 2018-07-07 DIAGNOSIS — F3289 Other specified depressive episodes: Secondary | ICD-10-CM

## 2018-07-07 DIAGNOSIS — T50902D Poisoning by unspecified drugs, medicaments and biological substances, intentional self-harm, subsequent encounter: Secondary | ICD-10-CM | POA: Diagnosis not present

## 2018-07-07 DIAGNOSIS — T50902A Poisoning by unspecified drugs, medicaments and biological substances, intentional self-harm, initial encounter: Secondary | ICD-10-CM | POA: Insufficient documentation

## 2018-07-07 DIAGNOSIS — J690 Pneumonitis due to inhalation of food and vomit: Secondary | ICD-10-CM | POA: Diagnosis not present

## 2018-07-07 DIAGNOSIS — F329 Major depressive disorder, single episode, unspecified: Secondary | ICD-10-CM | POA: Diagnosis not present

## 2018-07-07 DIAGNOSIS — Z09 Encounter for follow-up examination after completed treatment for conditions other than malignant neoplasm: Secondary | ICD-10-CM | POA: Diagnosis not present

## 2018-07-07 NOTE — Progress Notes (Signed)
Subjective:    Patient ID: Courtney SchanzElisabetta M Beard, female    DOB: 17-Aug-1950, 67 y.o.   MRN: [id number]  HPI: Courtney Schanzlisabetta M Beard is a 67 y.o. female who is here for Transitional Care Visit in  Follow up of her Metabolic Encephalopathy, Intentional Drug Overdose and Depression. She has been home receiving [facility]. She rated her pain 10. She denies pain today. Also reports appetite is fair.   Friend in the room.    Pain Inventory Average Pain 10 Pain Right Now 10 My pain is constant and aching  In the last 24 hours, has pain interfered with the following? General activity n/.a Relation with others n/a Enjoyment of life n/a What TIME of day is your pain at its worst? varies Sleep (in general) Fair  Pain is worse with: some activites Pain improves with: medication Relief from Meds: 5  Mobility walk without assistance ability to climb steps?  yes Do you have any goals in this area?  no  Function retired Do you have any goals in this area?  no  Neuro/Psych No problems in this area  Prior Studies Any changes since last visit?  no  Physicians involved in your care Any changes since last visit?  no   History reviewed. No pertinent family history. Social History   Socioeconomic History  . Marital status: Married    Spouse name: Not on file  . Number of children: Not on file  . Years of education: Not on file  . Highest education level: Not on file  Occupational History  . Not on file  Social Needs  . Financial resource strain: Not on file  . Food insecurity:    Worry: Not on file    Inability: Not on file  . Transportation needs:    Medical: Not on file    Non-medical: Not on file  Tobacco Use  . Smoking status: Former Games developermoker  . Smokeless tobacco: Never Used  . Tobacco comment: quit smoking in 2002  Substance and Sexual Activity  . Alcohol use: Yes    Comment: " occasional "  . Drug use: Never  . Sexual activity: Not on  file  Lifestyle  . Physical activity:    Days per week: Not on file    Minutes per session: Not on file  . Stress: Not on file  Relationships  . Social connections:    Talks on phone: Not on file    Gets together: Not on file    Attends religious service: Not on file    Active member of club or organization: Not on file    Attends meetings of clubs or organizations: Not on file    Relationship status: Not on file  Other Topics Concern  . Not on file  Social History Narrative   Mrs. Don BroachCalvert resides with spouse. She had two sons. Before admission she was driving and independent.    Past Surgical History:  Procedure Laterality Date  . COLONOSCOPY     Past Medical History:  Diagnosis Date  . Acute blood loss anemia 06/2018  . Anemia   . Depression   . H/O ETOH abuse   . Thyroid disease    BP 117/72   Pulse 65   Resp 14   Ht 5\' 2"  (1.575 m)   Wt 97 lb (44 kg)   SpO2 97%   BMI 17.74 kg/m   Opioid Risk Score:   Fall Risk Score:  `1  Depression screen  PHQ 2/9  No flowsheet data found.  Review of Systems  Constitutional: Negative.   HENT: Negative.   Respiratory: Negative.   Cardiovascular: Negative.   Gastrointestinal: Negative.   Endocrine: Negative.   Genitourinary: Negative.   Musculoskeletal: Positive for arthralgias.  Allergic/Immunologic: Negative.   Neurological: Negative.   Hematological: Negative.   Psychiatric/Behavioral: Negative.   All other systems reviewed and are negative.      Objective:   Physical Exam  Constitutional: She is oriented to person, place, and time. She appears well-developed and well-nourished.  HENT:  Head: Normocephalic and atraumatic.  Neck: Normal range of motion. Neck supple.  Cardiovascular: Normal rate and regular rhythm.  Pulmonary/Chest: Effort normal and breath sounds normal.  Musculoskeletal:  Normal Muscle Bulk and Muscle Testing Reveals:  Upper Extremities: Full ROM and Muscle Strength 5/5 Lower  Extremities: Full ROM and Muscle Strength 5/5 Arises from Table with ease Narrow Based Gait   Neurological: She is alert and oriented to person, place, and time.  Skin: Skin is warm and dry.  Psychiatric: She has a normal mood and affect. Her behavior is normal.  Nursing note and vitals reviewed.         Assessment & Plan:  1. Metabolic Encephalopathy: Continue to Monitor: PCP Following. Continue with Outpatient Therapy and Monitor.  2. Intentional Drug Overdose: Reports she seen Charmian Muff at [facility]. She doesn't have a scheduled appointment with [facility]. This provider sent an e-mal to Ms. Evans regarding the above, aawiting a response.  3. Depression: Continue Lexapro: Awaiting Response from [facility].  At this time she denies being depressed. Also states she would prefer to speak to female, awaiting a response from Ms. Evans. She has  supervision from her friend. Continue to monitor.   30  minutes of face to face patient care time was spent during this visit. All questions were encouraged and answered.  F/U with Dr Riley Kill in 4-6 weeks.

## 2018-07-13 ENCOUNTER — Ambulatory Visit: Payer: Medicare HMO | Admitting: Physical Therapy

## 2018-07-13 ENCOUNTER — Other Ambulatory Visit: Payer: Self-pay

## 2018-07-13 ENCOUNTER — Ambulatory Visit: Payer: Medicare HMO | Attending: Family Medicine | Admitting: Speech Pathology

## 2018-07-13 ENCOUNTER — Encounter: Payer: Self-pay | Admitting: Physical Therapy

## 2018-07-13 DIAGNOSIS — R2689 Other abnormalities of gait and mobility: Secondary | ICD-10-CM | POA: Diagnosis present

## 2018-07-13 DIAGNOSIS — M6281 Muscle weakness (generalized): Secondary | ICD-10-CM

## 2018-07-13 DIAGNOSIS — R41841 Cognitive communication deficit: Secondary | ICD-10-CM | POA: Insufficient documentation

## 2018-07-13 DIAGNOSIS — R498 Other voice and resonance disorders: Secondary | ICD-10-CM | POA: Insufficient documentation

## 2018-07-13 DIAGNOSIS — M25541 Pain in joints of right hand: Secondary | ICD-10-CM | POA: Diagnosis present

## 2018-07-13 DIAGNOSIS — M25542 Pain in joints of left hand: Secondary | ICD-10-CM | POA: Diagnosis present

## 2018-07-13 DIAGNOSIS — R41844 Frontal lobe and executive function deficit: Secondary | ICD-10-CM | POA: Insufficient documentation

## 2018-07-13 NOTE — Therapy (Signed)
[facility] [address] [place], [place], [address] Phone: [phone]   Fax:  [phone]  Speech Language Pathology Evaluation  Patient Details  Name: Courtney Beard MRN: [id number] Date of Birth: Feb 12, 1951 Referring Provider (SLP): Dr. Riley KillSwartz   Encounter Date: 07/13/2018  End of Session - 07/13/18 1049    Visit Number  1    Number of Visits  9    Date for SLP Re-Evaluation  09/11/18    Authorization Type  [employer] 1 copay if multiple visits on same day    SLP Start Time  0938    SLP Stop Time   1031    SLP Time Calculation (min)  53 min    Activity Tolerance  Patient tolerated treatment well       Past Medical History:  Diagnosis Date  . Acute blood loss anemia 06/2018  . Anemia   . Depression   . H/O ETOH abuse   . Thyroid disease     Past Surgical History:  Procedure Laterality Date  . COLONOSCOPY      There were no vitals filed for this visit.  Subjective Assessment - 07/13/18 0938    Subjective  "I think they're getting clearer with every day."    Currently in Pain?  No/denies         [facility] - 07/13/18 [id number]      SLP Visit Information   SLP Received On  07/13/18    Referring Provider (SLP)  Dr. Riley KillSwartz    Onset Date  05/23/18    Medical Diagnosis  metabolic encephalopathy      Subjective   Subjective  Pt reports improving cognition since hospitalization    Patient/Family Stated Goal  cont to improve cognition, voice      General Information   HPI  Patient is a 67 year old right-handed female with history of depression, alcohol use. Presented 05/23/2018 after being found unresponsive with empty bottles of Klonopin and Zanaflex at bedside. CPR was initiated. Toxic metabolic encephalopathy, septic shock, cardiac arrest with resp. failure after suspected intentional drug overdose, dysphagia, tachycardia due to deconditioning. Prolonged intubation with post-extubation  dysphagia per FEES, advanced to regular/thin per repeat MBS on 06/24/18. Admitted to CIR 06/19/18-06/25/18 where ST addressed moderate-severe cognitive communicaton impairments including sustained attention, problem solving, recall, and awareness.     Behavioral/Cognition  alert, cooperative    Mobility Status  ambulated to session      Balance Screen   Has the patient fallen in the past 6 months  No    Has the patient had a decrease in activity level because of a fear of falling?   No    Is the patient reluctant to leave their home because of a fear of falling?   No      Prior Functional Status   Cognitive/Linguistic Baseline  Within functional limits    Type of Home  House     Lives With  Spouse    Available Support  Family    Vocation  Retired   retired respiratory therapist     Cognition   Overall Cognitive Status  Impaired/Different from baseline    Attention  Selective;Alternating    Selective Attention  Appears intact    Alternating Attention  Impaired    Alternating Attention Impairment  Functional basic;Verbal basic    Memory  Impaired    Memory Impairment  Decreased short term memory    Decreased Short Term Memory  Verbal  basic   delayed recall 1/5, 5/5 with category cue   Awareness  Impaired    Awareness Impairment  Emergent impairment;Anticipatory impairment   verbal cues for error awareness in trailmaking   Problem Solving  Impaired    Problem Solving Impairment  Verbal complex    Systems developer  Impaired    Organizing Impairment  Verbal complex;Functional basic      Auditory Comprehension   Overall Auditory Comprehension  Appears within functional limits for tasks assessed      Visual Recognition/Discrimination   Discrimination  Within Function Limits      Reading Comprehension   Reading Status  Within funtional limits      Expression   Primary Mode of Expression  Verbal      Verbal Expression   Overall Verbal Expression   Appears within functional limits for tasks assessed      Written Expression   Dominant Hand  Right    Written Expression  Not tested      Oral Motor/Sensory Function   Overall Oral Motor/Sensory Function  Appears within functional limits for tasks assessed      Motor Speech   Overall Motor Speech  Impaired    Respiration  Within functional limits    Phonation  Hoarse    Resonance  Within functional limits    Articulation  Within functional limitis    Intelligibility  Intelligible    Word  75-100% accurate    Phrase  75-100% accurate    Sentence  75-100% accurate    Conversation  75-100% accurate    Motor Planning  Witnin functional limits    Phonation  Impaired    Vocal Abuses  Habitual Cough/Throat Clear    Volume  Soft    Pitch  Low      Standardized Assessments   Standardized Assessments   Cognitive Linguistic Quick Test;Montreal Cognitive Assessment (MOCA)    Montreal Cognitive Assessment (MOCA)   MOCA-Basic 21/30      Cognitive Linguistic Quick Test (Ages 18-69)   Attention  Mild    Memory  Mild    Executive Function  Mild    Language  WNL    Visuospatial Skills  Mild    Severity Rating Total  16    Composite Severity Rating  13.6                      SLP Education - 07/13/18 1049    Education Details  Use of a calendar; reduce throat clearing    Person(s) Educated  Patient;Spouse    Methods  Explanation;Handout    Comprehension  Verbalized understanding;Need further instruction         SLP Long Term Goals - 07/13/18 1050      SLP LONG TERM GOAL #1   Title  Pt will use a memory compensation system to record, reference and organize daily activities/appointments x3 sessions     Time  4    Period  Weeks    Status  New      SLP LONG TERM GOAL #2   Title  Pt will report carryover of 3 vocal hygiene strategies over 3 visits.     Time  4    Period  Weeks    Status  New      SLP LONG TERM GOAL #3   Title  Pt will use abdominal breathing in  8 minutes simple conversation 85% accuracy.  Time  4    Period  Weeks    Status  New       Plan - 07/13/18 1054    Clinical Impression Statement  Courtney Beard presents with mild cognitive communication impairment with deficits in alternating attention, organization, problem solving and short-term recall. Slow processing also noted. Pt/husband feel pt is improving cognitively but is not at her functional baseline. Pt enjoys reading and reports she was happy to be able to read and follow a novel at home. Her husband is managing pt's schedule and appointments at this time, however pt expressed desire to keep track of her own hair appointments. Pt/husband express concerns about pt's voice, which remains hoarse, low-intensity after prolonged intubation. Pt noted with harsh throat clearing throughout assessment. She has not yet seen ENT. Per notes, OT to address functional organization, alternating attention. Pt would benefit from skilled ST to address cognitive deficits and vocal quality in order to improve independence/schedule management and communication at home and in the community. Will address vocal hygiene and abdominal breathing, consider vocal function exercises if appropriate per ENT.     Speech Therapy Frequency  2x / week    Duration  4 weeks   or 9 visits total   Treatment/Interventions  Cognitive reorganization;Compensatory strategies;Compensatory techniques;Cueing hierarchy;Internal/external aids;Functional tasks;Patient/family education;SLP instruction and feedback    Potential to Achieve Goals  Good    Potential Considerations  Financial resources    Consulted and Agree with Plan of Care  Patient;Family member/caregiver    Family Member Consulted  husband       Patient will benefit from skilled therapeutic intervention in order to improve the following deficits and impairments:   Cognitive communication deficit  Other voice and resonance disorders    Problem List Patient  Active Problem List   Diagnosis Date Noted  . Metabolic encephalopathy 06/19/2018  . Depression   . Alcohol abuse   . Leukocytosis   . Acute blood loss anemia   . MDD (major depressive disorder), recurrent severe, without psychosis (HCC)   . Overdose 05/23/2018  . Acute hypoxemic respiratory failure (HCC)   . Aspiration pneumonia of right lung due to gastric secretions [facility])    Rondel Baton, MS, CCC-SLP Speech-Language Pathologist  Arlana Lindau 07/13/2018, 11:05 AM  [facility] [address] [place], [place], [address] Phone: [phone]   Fax:  [phone]  Name: Courtney Beard MRN: [id number] Date of Birth: 25-Nov-1950

## 2018-07-13 NOTE — Patient Instructions (Signed)
Using a Calendar System to Help Your Memory  . Carry your calendar with you at all times . Decide on a place for your calendar o Choose a place to keep your calendar at home o Put it back in the same place when you finish recording information or when you return home . Review your calendar with someone. o It may be helpful for someone to give you advice about information to record.  . Record information at the time it occurs.  o Don't wait until later to write down information o Ask others for a moment to record in your calendar . Review your calendar at least twice a day. o Morning: check plans, write "To Do" list o Evening: review day, make notes, plan for next day, transfer incomplete tasks to another day . Write in all appointments. o Eg. doctor, dentist, school, hairdresser . Write in weekly scheduled events. o Record things you do each week such as: your therapy schedule, exercise class, garbage day, Sunday school o Add scheduled events for things you have trouble remembering, such as: completing exercises, making phone calls, reviewing your budget . Write in upcoming events. o Birthdays, holidays, family activities, social events, bills due . Write a "To Do" list every day. o Include items you need to do and items you are asked to do o Check items off ONLY after you do them o Record things you can achieve on that day. Look to another day if you are too busy. . Record details from your day. o Write down information from conversations so you can follow up later. o Record information from appointments so you can remember what was said. . Reference your calendar throughout the day. o Check off items on your "To Do" list o Check your schedule . Plan out larger tasks. o Break down into steps and write in your calendar to make the task more manageable . Review your calendar every few days. o Make sure you have transferred "To-Do" items have not completed to another day o Review the  events you've recording and try to remember some details . Review your calendar at the end of the month. o Copy events to the next month.  

## 2018-07-13 NOTE — Therapy (Signed)
[facility] [address] [place], [place], [address] Phone: [phone]   Fax:  [phone]  Physical Therapy Evaluation  Patient Details  Name: Courtney Beard MRN: [id number] Date of Birth: 15-Mar-1951 Referring Provider (PT): Faith RogueSwartz, Zachary   Encounter Date: 07/13/2018  PT End of Session - 07/13/18 1550    Visit Number  1    Number of Visits  9    Date for PT Re-Evaluation  09/11/18    Authorization Type  [employer] Medicare; 1 copay if multiple disciplines on same day    PT Start Time  1316    PT Stop Time  1400    PT Time Calculation (min)  44 min    Activity Tolerance  Patient tolerated treatment well    Behavior During Therapy  [facility] for tasks assessed/performed       Past Medical History:  Diagnosis Date  . Acute blood loss anemia 06/2018  . Anemia   . Depression   . H/O ETOH abuse   . Thyroid disease     Past Surgical History:  Procedure Laterality Date  . COLONOSCOPY      There were no vitals filed for this visit.   Subjective Assessment - 07/13/18 1322    Subjective  No problems at all with balance and mobility-steadily improving every day.  Husband agrees that balance is good.  No problems with steps.  Not yet walking dogs in the neighborhood.  I'm clooking, cleaning, doing laundry.  Husband notes decrease in endurance, stamina.  No falls.    Pertinent History  Toxic metabolic encephalopathy, septic shock, cardiac arrest with resp. failure after suspected intentional drug overdose, dysphagia, tachycardia due to deconditioning     Patient Stated Goals  The only thing I'd like to work on is my stamina.    Currently in Pain?  No/denies         [facility] - 07/13/18 1325      Assessment   Medical Diagnosis  metabolic encephalopathy    Referring Provider (PT)  Faith RogueSwartz, Zachary    Onset Date/Surgical Date  05/23/18      Precautions   Precautions  None      Balance Screen   Has the  patient fallen in the past 6 months  No    Has the patient had a decrease in activity level because of a fear of falling?   No    Is the patient reluctant to leave their home because of a fear of falling?   No      Home Environment   Living Environment  Private residence    Living Arrangements  Spouse/significant other    Available Help at Discharge  Family    Type of Home  House    Home Access  Stairs to enter    Entrance Stairs-Number of Steps  3    Entrance Stairs-Rails  None    Home Layout  Two level;Bed/bath upstairs    Alternate Level Stairs-Number of Steps  14    Alternate Level Stairs-Rails  Left;Right      Prior Function   Level of Independence  Independent    Vocation  Retired   respiratory therapist   Leisure  walking dogs, dog park; enjoyed working out 5-6 days per week, 2-3 hrs per day.      ROM / Strength   AROM / PROM / Strength  Strength      Strength   Overall Strength  Deficits  Overall Strength Comments  Grossly tested at least 4/5 hip flexion, quads, hamstrings; 3+/5 bilateral ankle dorsiflexion      Transfers   Transfers  Sit to Stand;Stand to Sit    Sit to Stand  6: Modified independent (Device/Increase time)    Five time sit to stand comments   9.5    Stand to Sit  6: Modified independent (Device/Increase time)      Ambulation/Gait   Ambulation/Gait  Yes    Ambulation/Gait Assistance  7: Independent    Ambulation Distance (Feet)  1348 Feet    Assistive device  None    Gait Pattern  Step-through pattern    Ambulation Surface  Level;Indoor    Gait velocity  9 sec= 3.64 ft/sec      6 Minute Walk- Baseline   6 Minute Walk- Baseline  yes    HR (bpm)  76    02 Sat (%RA)  93 %    Modified Borg Scale for Dyspnea  0- Nothing at all    Perceived Rate of Exertion (Borg)  7- Very, very light      6 Minute walk- Post Test   6 Minute Walk Post Test  yes    HR (bpm)  66    02 Sat (%RA)  92 %    Modified Borg Scale for Dyspnea  0.5- Very, very slight  shortness of breath    Perceived Rate of Exertion (Borg)  7- Very, very light      6 minute walk test results    Aerobic Endurance Distance Walked  1348   normal values 1765 ft for 68-69 yo females     Standardized Balance Assessment   Standardized Balance Assessment  Timed Up and Go Test      Timed Up and Go Test   TUG  Normal TUG    Normal TUG (seconds)  9.6      High Level Balance   High Level Balance Comments  EO/EC 30 seconds solid surface, EO/EC standing on foam 30 seconds-able to maintain balance      Functional Gait  Assessment   Gait assessed   Yes    Gait Level Surface  Walks 20 ft in less than 5.5 sec, no assistive devices, good speed, no evidence for imbalance, normal gait pattern, deviates no more than 6 in outside of the 12 in walkway width.    Change in Gait Speed  Able to smoothly change walking speed without loss of balance or gait deviation. Deviate no more than 6 in outside of the 12 in walkway width.    Gait with Horizontal Head Turns  Performs head turns smoothly with slight change in gait velocity (eg, minor disruption to smooth gait path), deviates 6-10 in outside 12 in walkway width, or uses an assistive device.    Gait with Vertical Head Turns  Performs head turns with no change in gait. Deviates no more than 6 in outside 12 in walkway width.    Gait and Pivot Turn  Pivot turns safely within 3 sec and stops quickly with no loss of balance.    Step Over Obstacle  Is able to step over 2 stacked shoe boxes taped together (9 in total height) without changing gait speed. No evidence of imbalance.    Gait with Narrow Base of Support  Is able to ambulate for 10 steps heel to toe with no staggering.    Gait with Eyes Closed  Walks 20 ft, slow speed, abnormal gait pattern,  evidence for imbalance, deviates 10-15 in outside 12 in walkway width. Requires more than 9 sec to ambulate 20 ft.    Ambulating Backwards  Walks 20 ft, no assistive devices, good speed, no evidence for  imbalance, normal gait    Steps  Alternating feet, no rail.    Total Score  27                Objective measurements completed on examination: See above findings.                   PT Long Term Goals - 07/13/18 1558      PT LONG TERM GOAL #1   Title  Pt will be independent with HEP for improved gait and strengthening.  TARGET 08/14/18 (moved one week due to pt being out of town)    Time  4    Period  Weeks    Status  New    Target Date  08/14/18      PT LONG TERM GOAL #2   Title  Pt will improve 6 MWT by at least 150 ft  for improved gait efficiency and safety in community.    Time  4    Period  Weeks    Status  New    Target Date  08/14/18      PT LONG TERM GOAL #3   Title  Pt will verbalize plans for continued community fitness upon d/c from PT.    Time  4    Period  Weeks    Status  New    Target Date  09/10/18             Plan - 07/13/18 1551    Clinical Impression Statement  Pt is a 68 y.o female hospitalized 05/23/18 for toxic metabolic encephalopathy, cardiac arrest with resp failure after suspected intentional overdose. Pt required intubation. Pt received therapies on CIR and she was d/c home on 06/25/18.  She presents to OP PT with decreased lower extremity strength, decreased endurance for gait activities (per patient report and per 6 MWT).  Pt's gait distance in 6 MWT is 1348 ft, under normal value for older females, 61-48 year old, 1765 ft.  Prior to hospitalization, pt was independent and walked her two dogs at the park (one dog >40 lbs).  Pt would benefit from skilled PT to address above stated deficits for pt to return to PLOF.    History and Personal Factors relevant to plan of care:  metabolic encephalopathy with intubation; independent PTA    Clinical Presentation  Stable    Clinical Presentation due to:  metabolic encephalopathy     Clinical Decision Making  Low    Rehab Potential  Good    Clinical Impairments Affecting Rehab  Potential  motivated for therapy; family supportive; medical event precipitated by possible intentional overdose    PT Frequency  2x / week    PT Duration  4 weeks   plus eval, but may d/c early based on progress/level of function   PT Treatment/Interventions  Gait training;ADLs/Self Care Home Management;Therapeutic activities;Functional mobility training;Therapeutic exercise;Patient/family education;Balance training;Neuromuscular re-education    PT Next Visit Plan  Initiate HEP for ankle strengthening, walking program; gait outdoors/work on compliant surfaces    Consulted and Agree with Plan of Care  Patient;Family member/caregiver    Family Member Consulted  Husband       Patient will benefit from skilled therapeutic intervention in order to improve the following deficits and  impairments:  Decreased activity tolerance, Decreased strength, Decreased mobility  Visit Diagnosis: Other abnormalities of gait and mobility  Muscle weakness (generalized)     Problem List Patient Active Problem List   Diagnosis Date Noted  . Metabolic encephalopathy 06/19/2018  . Depression   . Alcohol abuse   . Leukocytosis   . Acute blood loss anemia   . MDD (major depressive disorder), recurrent severe, without psychosis (HCC)   . Overdose 05/23/2018  . Acute hypoxemic respiratory failure (HCC)   . Aspiration pneumonia of right lung due to gastric secretions (HCC)     Gertrude Bucks W. 07/13/2018, 4:03 PM  Gean Maidens., PT   [facility] [address] [place], [place], [address] Phone: [phone]   Fax:  [phone]  Name: Courtney Beard MRN: [id number] Date of Birth: 1950-11-17

## 2018-07-15 ENCOUNTER — Ambulatory Visit: Payer: Medicare HMO | Admitting: Occupational Therapy

## 2018-07-15 ENCOUNTER — Ambulatory Visit: Payer: Medicare HMO | Admitting: Physical Therapy

## 2018-07-15 DIAGNOSIS — R41844 Frontal lobe and executive function deficit: Secondary | ICD-10-CM

## 2018-07-15 DIAGNOSIS — M25542 Pain in joints of left hand: Secondary | ICD-10-CM

## 2018-07-15 DIAGNOSIS — R2689 Other abnormalities of gait and mobility: Secondary | ICD-10-CM | POA: Diagnosis not present

## 2018-07-15 DIAGNOSIS — R41841 Cognitive communication deficit: Secondary | ICD-10-CM | POA: Diagnosis not present

## 2018-07-15 DIAGNOSIS — M25541 Pain in joints of right hand: Secondary | ICD-10-CM

## 2018-07-15 DIAGNOSIS — M6281 Muscle weakness (generalized): Secondary | ICD-10-CM

## 2018-07-15 DIAGNOSIS — R498 Other voice and resonance disorders: Secondary | ICD-10-CM | POA: Diagnosis not present

## 2018-07-15 NOTE — Patient Instructions (Signed)
Access Code: [email or web address]  URL: [email or web address]  Date: 07/15/2018  Prepared by: Lonia BloodAmy Marriott   Exercises  Heel Toe Raises with Counter Support - 10 reps - 2 sets - 3 sec hold - 1x daily - 5x weekly  Squat at Table - 10 reps - 2 sets - 1x daily - 5x weekly  Standing Tandem Balance with Counter Support - 3 reps - 1 sets - 10-15 sec hold - 1x daily - 5x weekly

## 2018-07-15 NOTE — Therapy (Addendum)
[facility] [address] [place], [place], [address] Phone: [phone]   Fax:  [phone]  Occupational Therapy Treatment  Patient Details  Name: Courtney Beard MRN: [id number] Date of Birth: [birth year] Referring Provider (OT): Dr. Riley Kill   Encounter Date: 07/15/2018  OT End of Session - 07/15/18 1054    Visit Number  2    Number of Visits  17    Date for OT Re-Evaluation  08/30/18    Authorization Type  [employer] Medicare    Authorization - Visit Number  2    Authorization - Number of Visits  10    OT Start Time  1022    OT Stop Time  1100    OT Time Calculation (min)  38 min       Past Medical History:  Diagnosis Date  . Acute blood loss anemia 06/2018  . Anemia   . Depression   . H/O ETOH abuse   . Thyroid disease     Past Surgical History:  Procedure Laterality Date  . COLONOSCOPY      There were no vitals filed for this visit.  Subjective Assessment - 07/15/18 1650    Subjective   Pt reports bilateral hand pain    Pertinent History  Toxic metabolic encephalopathy, septic shock, cardiac arrest with resp. failure after suspected intentional drug overdose, dysphagia, tachycardia due to deconditioning    Patient Stated Goals  resume prior level of function    Currently in Pain?  Yes    Pain Score  8     Pain Location  Hand    Pain Orientation  Right;Left    Pain Descriptors / Indicators  Aching    Pain Type  Chronic pain    Pain Onset  More than a month ago    Pain Frequency  Intermittent    Aggravating Factors   mornings    Pain Relieving Factors  tylenol, heat                     Treatment: Fluidotherapy x 10 mins to bilateral hands for pain relief, no adverse reactions. Standing to copy small peg design, for functional endurance with a cognitive component, pt copied design with 1 v.c, and no rest breaks. Pt stood for approximately 15 mins Planning a meal and generating a  grocery list and estimated charges with only 1 v.c Pt is impulsive and perfroms tasks quickly, she requires v.c for thoroughness.        OT Short Term Goals - 07/01/18 1631      OT SHORT TERM GOAL #1   Title  I with HEP     Time  4    Period  Weeks    Status  New      OT SHORT TERM GOAL #2   Title  Pt will perform basic home managment tasks modified independently demonstrating good safety awareness.    Time  4    Period  Weeks    Status  New      OT SHORT TERM GOAL #3   Title  Pt will verbalize understanding of compensatory strategies for short term memory/cognitive deficits.    Time  4    Period  Weeks    Status  New      OT SHORT TERM GOAL #4   Title  Pt will perform basic cooking task with supervision demonstrating good safety awareness.    Time  4  Period  Weeks    Status  New      OT SHORT TERM GOAL #5   Title  Pt will report performing home management activities x 30 mins in standing without rest break.    Time  4    Period  Weeks    Status  New      Additional Short Term Goals   Additional Short Term Goals  Yes      OT SHORT TERM GOAL #6   Title  Pt will demonstrate ability to perform a functional organization task with no more than min v.c (plan a meal and generate a grocery list)     Time  4    Period  Weeks    Status  New        OT Long Term Goals - 07/01/18 1633      OT LONG TERM GOAL #1   Title  Pt will perform mod complex cooking and home management at a modified independent level demonstrating good safety awareness.    Time  8    Period  Weeks    Status  New    Target Date  08/30/18      OT LONG TERM GOAL #2   Title  Pt demonstate improved grip strength for ADLS as evidenced by increasing bilateral grip strength by 5lbs.    Time  8    Period  Weeks    Status  New      OT LONG TERM GOAL #3   Title  Pt will demonstrate ability to alternate attention between a physical and cognitive task with 90% or better accuracy, in prep for driving.     Time  8    Period  Weeks    Status  New      OT LONG TERM GOAL #4   Title  Pt will demonstrate improved endurance with pt reporting ability to stand x 45 mins without rest breaks for ADL/IADLS.    Time  8    Period  Weeks    Status  New      OT LONG TERM GOAL #5   Title  Pt will verbalize understanding of ways to manage hand pain and joint protection strategies prn    Time  8    Period  Weeks    Status  New            Plan - 07/15/18 1056    Clinical Impression Statement  Pt is progressing towards goals. She demonstrates improving endurance. Pt demonstrates improving cognitive skills as well.    Occupational Profile and client history currently impacting functional performance  PMH: toxic metabolic encephalopathy, cardiac arrest with resp failure after suspected intentional overdose, dysphagia, tachycardia due to deconditioning, depression, alcohol abuse, Pt i s retired respiratory therapist, she was completely I prior to hospitalization, pt lives with her husband( he works weekend option). She is accompanied today by her son who lives out of town.    Rehab Potential  Good    Current Impairments/barriers affecting progress:  cognitive deficits, hx of alcohol abuse, depression, overdose, pain    OT Frequency  2x / week    OT Duration  8 weeks    OT Treatment/Interventions  Self-care/ADL training;Therapeutic exercise;Patient/family education;Neuromuscular education;Moist Heat;Aquatic Therapy;Paraffin;Therapeutic activities;Functional Mobility Training;Energy conservation;Fluidtherapy;Cryotherapy;Ultrasound;Contrast Bath;DME and/or AE instruction;Manual Therapy;Passive range of motion;Cognitive remediation/compensation    Plan  simple cooking task, assess safety,- pt is going out of town next week compensatory strategies for short term memory deficits,  activity tolerance,     Consulted and Agree with Plan of Care  Family member/caregiver;Patient    Family Member Consulted  husband         Patient will benefit from skilled therapeutic intervention in order to improve the following deficits and impairments:  Abnormal gait, Decreased cognition, Decreased knowledge of use of DME, Pain, Decreased mobility, Decreased activity tolerance, Decreased endurance, Decreased strength, Impaired UE functional use, Impaired perceived functional ability, Decreased safety awareness, Decreased knowledge of precautions  Visit Diagnosis: Pain in joint of left hand  Pain in joint of right hand  Frontal lobe and executive function deficit  Muscle weakness (generalized)    Problem List Patient Active Problem List   Diagnosis Date Noted  . Metabolic encephalopathy 06/19/2018  . Depression   . Alcohol abuse   . Leukocytosis   . Acute blood loss anemia   . MDD (major depressive disorder), recurrent severe, without psychosis (HCC)   . Overdose 05/23/2018  . Acute hypoxemic respiratory failure (HCC)   . Aspiration pneumonia of right lung due to gastric secretions (HCC)     Jamarien Rodkey 07/15/2018, 4:52 PM  [facility] [address] [place], [place], [address] Phone: [phone]   Fax:  [phone]  Name: MAUREENA DABBS MRN: [id number] Date of Birth: 1951-04-13

## 2018-07-15 NOTE — Therapy (Signed)
[facility] [address] [place], [place], [address] Phone: [phone]   Fax:  [phone]  Physical Therapy Treatment  Patient Details  Name: Courtney Beard MRN: [id number] Date of Birth: 07-12-1951 Referring Provider (PT): Faith RogueSwartz, Zachary   Encounter Date: 07/15/2018  PT End of Session - 07/15/18 1948    Visit Number  2    Number of Visits  9    Date for PT Re-Evaluation  09/11/18    Authorization Type  [employer]na Medicare; 1 copay if multiple disciplines on same day    PT Start Time  0933    PT Stop Time  1018    PT Time Calculation (min)  45 min    Activity Tolerance  Patient tolerated treatment well    Behavior During Therapy  [facility] for tasks assessed/performed       Past Medical History:  Diagnosis Date  . Acute blood loss anemia 06/2018  . Anemia   . Depression   . H/O ETOH abuse   . Thyroid disease     Past Surgical History:  Procedure Laterality Date  . COLONOSCOPY      There were no vitals filed for this visit.  Subjective Assessment - 07/15/18 0936    Subjective  Did have two falls since eval-one while walking dog in stones in yard, second going up steps.  Lower legs felt weak.  Was able to eventually get up with upper body assistance.    Pertinent History  Toxic metabolic encephalopathy, septic shock, cardiac arrest with resp. failure after suspected intentional drug overdose, dysphagia, tachycardia due to deconditioning     Patient Stated Goals  The only thing I'd like to work on is my stamina.    Currently in Pain?  No/denies       Sensory Organization Test: Condition 1:  WNL Condition 2:  Decreased for trials 2 and 3 Condition 3:  Decreased for trials 1 and 2 Condition 4:  Decreased for trials 1 and 2 Condition 5:  Decreased for trials 2 and 3 Condition 6:  Decreased for trials 2 and 3; fall trial 1  Composite score:  61/100  Sensory Analysis:   Below WNL for Somatosensory (approx  90)    Below WNL for Vision (approx 75)    Below WNL for Vestibular (approx 50)  Center of gravity alignment:  Anterior and L  Strategy analysis:  Pt has good use of hip/ankle strategy except ankle dominant for Trial 1, condition 6.                 [facility] Adult PT Treatment/Exercise - 07/15/18 0001      Self-Care   Self-Care  Other Self-Care Comments    Other Self-Care Comments   Discussed in detail results of Sensory Organization test, including relation of test to real-life situations and how PT plans to address through exercises.  Given patient's recent 2 falls and performance on Sensory Organization test, discussed specific fall prevention information (lighting at night, being careful/attentive to unlevel surfaces/ways to stand and balance strategies when standing with dog in yard to avoid fall)          Balance Exercises - 07/15/18 1943      Balance Exercises: Standing   Tandem Stance  Eyes open;Intermittent upper extremity support;1 rep;10 secs    Heel Raises Limitations  10 reps    Toe Raise Limitations  10 reps    Other Standing Exercises  Standing mini-squats at counter, 10 reps,  to facilitate ankle dorsiflexor/lower extremity strengthening        PT Education - 07/15/18 1947    Education Details  Initiated HEP-see instructions; explained results of Sensory Organization test    Person(s) Educated  Patient    Methods  Explanation;Demonstration;Handout    Comprehension  Verbalized understanding;Returned demonstration          PT Long Term Goals - 07/13/18 1558      PT LONG TERM GOAL #1   Title  Pt will be independent with HEP for improved gait and strengthening.  TARGET 08/14/18 (moved one week due to pt being out of town)    Time  4    Period  Weeks    Status  New    Target Date  08/14/18      PT LONG TERM GOAL #2   Title  Pt will improve 6 MWT by at least 150 ft  for improved gait efficiency and safety in community.    Time  4    Period  Weeks     Status  New    Target Date  08/14/18      PT LONG TERM GOAL #3   Title  Pt will verbalize plans for continued community fitness upon d/c from PT.    Time  4    Period  Weeks    Status  New    Target Date  09/10/18            Plan - 07/15/18 1948    Clinical Impression Statement  Given pt's reported 2 falls since eval, PT had patient perform Sensory Organization test, with patient scoring below normal limits for composite balance score, for somatosensory,vision, vestibular system use for balance.  Pt noted to have forward displaced center of gravity.  Initiated HEP to address balance deficits and ankle weakness (as noted in eval).  Pt will continue to benefit from skilled PT to address balance, gait and strengthening.    Rehab Potential  Good    Clinical Impairments Affecting Rehab Potential  motivated for therapy; family supportive; medical event precipitated by possible intentional overdose    PT Frequency  2x / week    PT Duration  4 weeks   plus eval, but may d/c early based on progress/level of function   PT Treatment/Interventions  Gait training;ADLs/Self Care Home Management;Therapeutic activities;Functional mobility training;Therapeutic exercise;Patient/family education;Balance training;Neuromuscular re-education    PT Next Visit Plan  Review initial HEP, work on corner balance exercises and add to HEP; walking program    Consulted and Agree with Plan of Care  Patient    Family Member Consulted          Patient will benefit from skilled therapeutic intervention in order to improve the following deficits and impairments:  Decreased activity tolerance, Decreased strength, Decreased mobility  Visit Diagnosis: Other abnormalities of gait and mobility  Muscle weakness (generalized)     Problem List Patient Active Problem List   Diagnosis Date Noted  . Metabolic encephalopathy 06/19/2018  . Depression   . Alcohol abuse   . Leukocytosis   . Acute blood loss anemia    . MDD (major depressive disorder), recurrent severe, without psychosis (HCC)   . Overdose 05/23/2018  . Acute hypoxemic respiratory failure (HCC)   . Aspiration pneumonia of right lung due to gastric secretions (HCC)     Courtney Nanney W. 07/15/2018, 7:52 PM Gean Maidens., PT  [facility] [address] [place], [place], [address]  Phone: [phone]   Fax:  [phone]  Name: Courtney Beard MRN: [id number] Date of Birth: 30-May-1951

## 2018-07-21 ENCOUNTER — Encounter: Payer: Self-pay | Admitting: Occupational Therapy

## 2018-07-21 ENCOUNTER — Ambulatory Visit: Payer: Self-pay | Admitting: Physical Therapy

## 2018-07-22 ENCOUNTER — Encounter: Payer: Self-pay | Admitting: Occupational Therapy

## 2018-07-22 ENCOUNTER — Ambulatory Visit: Payer: Self-pay | Admitting: Physical Therapy

## 2018-07-28 ENCOUNTER — Encounter: Payer: Self-pay | Admitting: Physical Therapy

## 2018-07-28 ENCOUNTER — Ambulatory Visit: Payer: Medicare HMO | Admitting: Speech Pathology

## 2018-07-28 ENCOUNTER — Ambulatory Visit: Payer: Medicare HMO | Admitting: Occupational Therapy

## 2018-07-28 ENCOUNTER — Ambulatory Visit: Payer: Medicare HMO | Admitting: Physical Therapy

## 2018-07-28 DIAGNOSIS — R2689 Other abnormalities of gait and mobility: Secondary | ICD-10-CM

## 2018-07-28 DIAGNOSIS — M25542 Pain in joints of left hand: Secondary | ICD-10-CM

## 2018-07-28 DIAGNOSIS — R498 Other voice and resonance disorders: Secondary | ICD-10-CM | POA: Diagnosis not present

## 2018-07-28 DIAGNOSIS — R41844 Frontal lobe and executive function deficit: Secondary | ICD-10-CM | POA: Diagnosis not present

## 2018-07-28 DIAGNOSIS — M6281 Muscle weakness (generalized): Secondary | ICD-10-CM | POA: Diagnosis not present

## 2018-07-28 DIAGNOSIS — M25541 Pain in joints of right hand: Secondary | ICD-10-CM

## 2018-07-28 DIAGNOSIS — R41841 Cognitive communication deficit: Secondary | ICD-10-CM | POA: Diagnosis not present

## 2018-07-28 NOTE — Therapy (Signed)
[facility] [address] [place], [place], [address] Phone: [phone]   Fax:  [phone]  Physical Therapy Treatment  Patient Details  Name: Courtney Schanzlisabetta M Beard MRN: [id number] Date of Birth: 29-May-1951 Referring Provider (PT): Faith RogueSwartz, Zachary   Encounter Date: 07/28/2018  PT End of Session - 07/28/18 1105    Visit Number  3    Number of Visits  9    Date for PT Re-Evaluation  09/11/18    Authorization Type  [employer]na Medicare; 1 copay if multiple disciplines on same day    PT Start Time  1104    PT Stop Time  1150    PT Time Calculation (min)  46 min    Activity Tolerance  Patient tolerated treatment well    Behavior During Therapy  [facility] for tasks assessed/performed       Past Medical History:  Diagnosis Date  . Acute blood loss anemia 06/2018  . Anemia   . Depression   . H/O ETOH abuse   . Thyroid disease     Past Surgical History:  Procedure Laterality Date  . COLONOSCOPY      There were no vitals filed for this visit.  Subjective Assessment - 07/28/18 1105    Subjective  Getting stronger and better since she started. No falls. Feels the strength in her lower legs has made the difference with falls.     Pertinent History  Toxic metabolic encephalopathy, septic shock, cardiac arrest with resp. failure after suspected intentional drug overdose, dysphagia, tachycardia due to deconditioning     Patient Stated Goals  The only thing I'd like to work on is my stamina.    Currently in Pain?  No/denies                            Balance Exercises - 07/28/18 2000      Balance Exercises: Standing   Standing Eyes Opened  Narrow base of support (BOS);Head turns;Foam/compliant surface    Standing Eyes Closed  Narrow base of support (BOS);Head turns;Foam/compliant surface   Ok feet together; sway partial tandem   Tandem Stance  Eyes open;30 secs    SLS  Eyes open;Eyes closed;Foam/compliant  surface;Intermittent upper extremity support   EO, foam 30 sec bil; EC, foam 12 sec max   SLS with Vectors  Foam/compliant surface   cone taps (single, double, crossing over)   Balance Beam  black crosswise; EO, EC static 30 sec (feet apart); EO squats x 10; EO head turns, nods    Tandem Gait  Forward;Foam/compliant surface    Retro Gait  Foam/compliant surface;Head turns    Sidestepping  Foam/compliant support   toe walking   Marching Limitations  along blue mat, fwd, backward and again with head turns    Heel Raises Limitations  on foam 3 sec hold, ankle instability noted    Other Standing Exercises  on BOSU flat side up, EO, tipping edge toward floor at 12, 3, 6, 9 o'clock with 3 sec hold; single leg stance with raised leg alternating hip flexion, ext, abduction with light UE support; on BOSU round side up, squats x 10         PT Education - 07/28/18 2007    Education Details  reviewed and updated HEP    Person(s) Educated  Patient    Methods  Explanation;Demonstration;Verbal cues;Handout    Comprehension  Verbalized understanding;Returned demonstration;Verbal cues required;Need further instruction  PT Long Term Goals - 07/13/18 1558      PT LONG TERM GOAL #1   Title  Pt will be independent with HEP for improved gait and strengthening.  TARGET 08/14/18 (moved one week due to pt being out of town)    Time  4    Period  Weeks    Status  New    Target Date  08/14/18      PT LONG TERM GOAL #2   Title  Pt will improve 6 MWT by at least 150 ft  for improved gait efficiency and safety in community.    Time  4    Period  Weeks    Status  New    Target Date  08/14/18      PT LONG TERM GOAL #3   Title  Pt will verbalize plans for continued community fitness upon d/c from PT.    Time  4    Period  Weeks    Status  New    Target Date  09/10/18            Plan - 07/28/18 2008    Clinical Impression Statement  Session focused on balance training while challenging  her vestibular, vision, and somatosensory systems with pt having most difficulty with sensory input altered (compliant surface) and vision removed. Patient demonstrates ability to adapt to activity quickly and regain balance using ankle vs hip strategy (rarely required stepping strategy). Pt highly motivated and can continue to benefit from PT to reduce fall risk and return to highly active lifestyle.     Rehab Potential  Good    Clinical Impairments Affecting Rehab Potential  motivated for therapy; family supportive; medical event precipitated by possible intentional overdose    PT Frequency  2x / week    PT Duration  4 weeks   plus eval, but may d/c early based on progress/level of function   PT Treatment/Interventions  Gait training;ADLs/Self Care Home Management;Therapeutic activities;Functional mobility training;Therapeutic exercise;Patient/family education;Balance training;Neuromuscular re-education    PT Next Visit Plan  see if ?'s re: HEP, did she try to return to yoga class?; needs high level vestibular challenges; ?try treadmill fwd, backward, sidestepping with head turns? weather permitting outdoor/unlevel surfaces    Consulted and Agree with Plan of Care  Patient    Family Member Consulted          Patient will benefit from skilled therapeutic intervention in order to improve the following deficits and impairments:  Decreased activity tolerance, Decreased strength, Decreased mobility  Visit Diagnosis: Other abnormalities of gait and mobility  Muscle weakness (generalized)     Problem List Patient Active Problem List   Diagnosis Date Noted  . Metabolic encephalopathy 06/19/2018  . Depression   . Alcohol abuse   . Leukocytosis   . Acute blood loss anemia   . MDD (major depressive disorder), recurrent severe, without psychosis (HCC)   . Overdose 05/23/2018  . Acute hypoxemic respiratory failure (HCC)   . Aspiration pneumonia of right lung due to gastric secretions (HCC)      Zena Amos, PT 07/28/2018, 8:15 PM  [facility] [address] [place], [place], [address] Phone: [phone]   Fax:  [phone]  Name: Courtney Beard MRN: [id number] Date of Birth: June 17, 1951

## 2018-07-28 NOTE — Patient Instructions (Signed)
Access Code: [email or web address]  URL: [email or web address]  Date: 07/28/2018  Prepared by: Veda CanningLynn Dason Mosley   Exercises  Heel Toe Raises with Counter Support - 10 reps - 2 sets - 3 sec hold - 1x daily - 5x weekly  Squat at Table - 10 reps - 2 sets - 1x daily - 5x weekly  Foam-half tandem, eyes closed - 1 sets - 30 seconds hold - 1x daily - 7x weekly  Single Leg Stance on Foam Pad - 3 reps - 1 sets - 15-30 hold - 1x daily - 7x weekly   Educated 63

## 2018-07-28 NOTE — Therapy (Signed)
[facility] [address] [place], [place], [address] Phone: [phone]   Fax:  [phone]  Speech Language Pathology Treatment  Patient Details  Name: Courtney Beard MRN: [id number] Date of Birth: 16-Oct-1950 Referring Provider (SLP): Dr. Riley Kill   Encounter Date: 07/28/2018  End of Session - 07/28/18 1316    Visit Number  2    Number of Visits  9    Date for SLP Re-Evaluation  09/11/18    Authorization Type  [employer] 1 copay if multiple visits on same day    SLP Start Time  0935    SLP Stop Time   1015    SLP Time Calculation (min)  40 min    Activity Tolerance  Patient tolerated treatment well       Past Medical History:  Diagnosis Date  . Acute blood loss anemia 06/2018  . Anemia   . Depression   . H/O ETOH abuse   . Thyroid disease     Past Surgical History:  Procedure Laterality Date  . COLONOSCOPY      There were no vitals filed for this visit.  Subjective Assessment - 07/28/18 0944    Subjective  "Could I be aspirating?"    Currently in Pain?  No/denies            ADULT SLP TREATMENT - 07/28/18 0932      General Information   Behavior/Cognition  Alert;Cooperative      Treatment Provided   Treatment provided  Cognitive-Linquistic      Pain Assessment   Pain Assessment  No/denies pain      Cognitive-Linquistic Treatment   Treatment focused on  Voice    Skilled Treatment  Pt reports productive cough (see "S") but denies coughing with intake. She also denies fever, shortness of breath, wheezing, and reports sputum is clear. SLP provided education re: signs of aspiration pneumonia and further discussed pt's MBS which revealed no laryngeal penetration or aspiration. SLP explained that MBS is a "snapshot in time" and that given ? incomplete vocal fold closure, pt may be at increased risk for aspirating if some (normal) penetration were to occur during her swallow, however pt's  signs are not suggestive of aspiration PNA at this time. Encouraged pt to follow up with MD if her symptoms worsen or if she exhibits signs of pneumonia. Pt verbalized understanding. SLP introduced vocal hygiene strategies and abdominal breathing. Pt with frequent throat clearing during session (x6 in first 15 minutes), but demo'd awareness and attempts to reduce after SLP provided education. Pt with mixed success with AB (abdominal movement but occasional mod cues to reduce neck/shoulder/chest tension and movement. Pt did demo awareness of when she used AB with words (family members names) and improved vocal quality vs. hoarseness when she did not. Told pt to practice her breathing 15 min 2x per day at home.       Assessment / Recommendations / Plan   Plan  Continue with current plan of care      Progression Toward Goals   Progression toward goals  Progressing toward goals       SLP Education - 07/28/18 1316    Education Details  abdominal breathing, vocal hygiene, confidential voice vs whisper    Person(s) Educated  Patient    Methods  Explanation;Demonstration;Verbal cues;Handout    Comprehension  Verbalized understanding;Returned demonstration;Need further instruction         SLP Long Term Goals - 07/28/18 1319  SLP LONG TERM GOAL #1   Title  Pt will use a memory compensation system to record, reference and organize daily activities/appointments x3 sessions     Time  4    Period  Weeks    Status  On-going      SLP LONG TERM GOAL #2   Title  Pt will report carryover of 3 vocal hygiene strategies over 3 visits.     Time  4    Period  Weeks    Status  On-going      SLP LONG TERM GOAL #3   Title  Pt will use abdominal breathing in 8 minutes simple conversation 85% accuracy.    Time  4    Period  Weeks    Status  On-going       Plan - 07/28/18 1317    Clinical Impression Statement  Mrs. Malmquist presents with mild cognitive communication impairment with deficits in  alternating attention, organization, problem solving and short-term recall. Slow processing also noted. Pt/husband feel pt is improving cognitively but is not at her functional baseline. Pt enjoys reading and reports she was happy to be able to read and follow a novel at home. Her husband is managing pt's schedule and appointments at this time, however pt expressed desire to keep track of her own hair appointments. Pt/husband express concerns about pt's voice, which remains hoarse, low-intensity after prolonged intubation. Harsh throat clearing persists. She has not yet seen ENT. Maximum phonation time taken today; 11 seconds with raspy/rough phonation, average 63dB at 30 cm. Pt would benefit from skilled ST to address cognitive deficits and vocal quality in order to improve independence/schedule management and communication at home and in the community. Will address vocal hygiene and abdominal breathing, consider vocal function exercises if appropriate per ENT.     Speech Therapy Frequency  2x / week    Duration  4 weeks   or 9 visits total   Treatment/Interventions  Cognitive reorganization;Compensatory strategies;Compensatory techniques;Cueing hierarchy;Internal/external aids;Functional tasks;Patient/family education;SLP instruction and feedback    Potential to Achieve Goals  Good    Potential Considerations  Financial resources       Patient will benefit from skilled therapeutic intervention in order to improve the following deficits and impairments:   Other voice and resonance disorders    Problem List Patient Active Problem List   Diagnosis Date Noted  . Metabolic encephalopathy 06/19/2018  . Depression   . Alcohol abuse   . Leukocytosis   . Acute blood loss anemia   . MDD (major depressive disorder), recurrent severe, without psychosis (HCC)   . Overdose 05/23/2018  . Acute hypoxemic respiratory failure (HCC)   . Aspiration pneumonia of right lung due to gastric secretions [facility](HCC)     Rondel BatonMary Beth Jaki Hammerschmidt, MS, CCC-SLP Speech-Language Pathologist   Arlana LindauMary E Deadra Diggins 07/28/2018, 1:20 PM  [facility] [address] [place], [place], [address] Phone: [phone]   Fax:  [phone]   Name: Leonides Schanzlisabetta M Roycroft MRN: [id number] Date of Birth: [birth year]

## 2018-07-28 NOTE — Patient Instructions (Addendum)
VOICE CONSERVATION PROGRAM  1. Avoid overuse of voice or excessive use of the voice . The vocal cords can become easily fatigued. Try to sort out what is "necessary" versus "unnecessary" talking in your environment. You do not need to STOP talking, but try to limit it as much as possible.  . Think of resting your voice just as long as you talk. For example, if you talk for 5 minutes, rest your voice completely for 5 minutes. . If your voice feels "tired" during the day, try to rest it as much as possible.  2. Avoid using an excessively loud voice or shouting/raising your voice . When you yell or raise your voice, the vocal cords slam into each other, much like a strong hand clap. This causes irritation, and if this irritation continues, hoarseness may increase. . If people in your home talk loudly, ask them to reduce the volume of their voices to help you decrease your volume as well. . Sit near or face the person to whom you are speaking.   3. Avoid talking over background noise . When talking in background noise, speech automatically has increased loudness, and as in number 2 above, continued loud speech can result in increased hoarseness due to irritation to the vocal cords.  . Do not talk over the radio or the TV. Mute them before speaking, go to a quieter place to talk, or sit next to the person with whom you are watching.  4. Talk in a voice that is soft, smooth, and gentle . This allows the vocal cords to come together in a gentle way and allows the air being exhaled from the lungs to do most/all of the work when you are speaking.  5. Avoid excessive throat clearing, coughing, and loud laughter . During these activities the vocal cords can also be slammed together in a hard way and foster irritation or swelling, which can cause hoarseness. . Try taking sips of room temperature/cool water with hard swallows to clear secretions from the throat, instead of throat clearing or  coughing. . If you absolutely must clear your throat, do so as gently as possible. If you find yourself clearing your throat or coughing a lot, consult your physician. . You will want to laugh. Continue to do so, but softly and gently. Do not laugh loudly for long periods of time because this can increase the chances for vocal fold irritation and thus hoarseness. . Lozenges can help reduce the need to clear throat/cough during cold/allergy season.  6. Keep the mouth and throat lubricated . Drink at least 8-10 8 oz. glasses of water per day (64-80 oz.). This water can come in the form of drink mixes.  . Caffeinated beverages such as colas, coffee, and tea (hot tea AND sweet tea) dry out the vocal cords and then can cause irritation and thus hoarseness.  . Drinking water will keep your body hydrated. This will help to decrease secretions in the throat, which cause people to clear their throats or cough. . If you are exercising outside (especially during drier weather), try to breathe through your nose as much as possible. If this is not possible, lifting the tongue up behind the upper teeth when breathing through the mouth will add some moisture to the air breathed in past the vocal cords.  7. Avoid mouth breathing - breathe through your nose . Your nose serves as a natural filter for dust and dirt particles from the air, and as a humidifier to   moisten the air. Vocal cords like to work in moistened, filtered air. When you breathe through your mouth you lose the air filtering and moistening benefits of breathing through the nose. Marland Kitchen. Be aware of breathing patterns when sitting quietly (e.g., reading or watching TV). Increase your awareness and try to change habits from mouth breathing to nose breathing during those times.  8. Avoid environmental and/or ingested irritants . Try to avoid smoke-filled and or dusty environments. These items dry out the vocal cords and cause irritation.  9. Use an air filter  if the home is dusty, and/or a humidifier if the air is dry  . This will help to maintain clean, humid air to breathe. Remember, this type of air is what the vocal cords like best.  ___________________________________   Abdominal Breathing exercises   . Shoulders down - this is a cue to relax . Place your hand on your abdomen - this helps you focus on easy abdominal breath support - the best and most relaxed way to breathe . Breathe in through your nose and fill your belly with air, watching your hand move outward . Breathe out through your mouth and watch your belly move in. An audible "sh"  may help   Think of your belly as a balloon, when you fill with air (inhale), the balloon gets bigger. As the air goes out (exhale), the balloon deflates.  If you are having difficulty coordinating this, lay on your back with a plastic cup on your belly and repeat the above steps, watching you belly move up with inhalation and down with exhalations  Practice breathing in and out in front of a mirror, watching your belly Breathe in for a count of 5 and breathe out for a count of 5  Once you have the rhythm, try adding some speech, using your exhale to power your speech (family members names, dogs, phrases you say often)    Signs of Aspiration Pneumonia   . Chest pain/tightness . Fever (can be low grade) . Cough  o With foul-smelling phlegm (sputum) o With sputum containing pus or blood o With greenish sputum . Fatigue  . Shortness of breath  . Wheezing   **IF YOU HAVE THESE SIGNS, CONTACT YOUR DOCTOR OR GO TO THE EMERGENCY DEPARTMENT OR URGENT CARE AS SOON AS POSSIBLE**

## 2018-07-28 NOTE — Therapy (Signed)
[facility] [address] [place], [place], [address] Phone: [phone]   Fax:  [phone]  Occupational Therapy Treatment  Patient Details  Name: Courtney Beard MRN: [id number] Date of Birth: 11-09-1950 Referring Provider (OT): Dr. Naaman Plummer   Encounter Date: 07/28/2018  OT End of Session - 07/28/18 1117    Visit Number  3    Number of Visits  17    Date for OT Re-Evaluation  08/30/18    Authorization Type  [employer]na Medicare    Authorization - Visit Number  3    Authorization - Number of Visits  10    OT Start Time  5916    OT Stop Time  1100    OT Time Calculation (min)  45 min    Activity Tolerance  Patient tolerated treatment well    Behavior During Therapy  [facility] for tasks assessed/performed       Past Medical History:  Diagnosis Date  . Acute blood loss anemia 06/2018  . Anemia   . Depression   . H/O ETOH abuse   . Thyroid disease     Past Surgical History:  Procedure Laterality Date  . COLONOSCOPY      There were no vitals filed for this visit.  Subjective Assessment - 07/28/18 1019    Subjective   I've been doing a lot of cooking    Pertinent History  Toxic metabolic encephalopathy, septic shock, cardiac arrest with resp. failure after suspected intentional drug overdose, dysphagia, tachycardia due to deconditioning    Patient Stated Goals  resume prior level of function    Currently in Pain?  Yes    Pain Score  6     Pain Location  Hand    Pain Orientation  Right;Left    Pain Descriptors / Indicators  Aching    Pain Type  Chronic pain    Pain Onset  More than a month ago    Pain Frequency  Constant    Aggravating Factors   cold    Pain Relieving Factors  pain meds, heat                   OT Treatments/Exercises (OP) - 07/28/18 0001      ADLs   Cooking  Pt I'ly gathering all ingredients and sequencing well to make brownies, then rice-a-roni on stovetop. Pt accidentally  through away box w/ instructions before remembering time needed to bake brownies and had to retrieve box. Pt demo ability to multi-task and problem solve during cooking tasks. Pt demo safety t/o task.       Modalities   Modalities  Paraffin      RUE Paraffin   Number Minutes Paraffin  10 Minutes    RUE Paraffin Location  Hand    Comments  at end of session      LUE Paraffin   Number Minutes Paraffin  10 Minutes    LUE Paraffin Location  Hand    Comments  simultaneously with Rt hand               OT Short Term Goals - 07/28/18 1117      OT SHORT TERM GOAL #1   Title  I with HEP     Time  4    Period  Weeks    Status  New      OT SHORT TERM GOAL #2   Title  Pt will perform basic home managment tasks modified independently  demonstrating good safety awareness.    Time  4    Period  Weeks    Status  New      OT SHORT TERM GOAL #3   Title  Pt will verbalize understanding of compensatory strategies for short term memory/cognitive deficits.    Time  4    Period  Weeks    Status  New      OT SHORT TERM GOAL #4   Title  Pt will perform basic cooking task with supervision demonstrating good safety awareness.    Time  4    Period  Weeks    Status  Achieved      OT SHORT TERM GOAL #5   Title  Pt will report performing home management activities x 30 mins in standing without rest break.    Time  4    Period  Weeks    Status  Achieved      OT SHORT TERM GOAL #6   Title  Pt will demonstrate ability to perform a functional organization task with no more than min v.c (plan a meal and generate a grocery list)     Time  4    Period  Weeks    Status  On-going        OT Long Term Goals - 07/01/18 1633      OT LONG TERM GOAL #1   Title  Pt will perform mod complex cooking and home management at a modified independent level demonstrating good safety awareness.    Time  8    Period  Weeks    Status  New    Target Date  08/30/18      OT LONG TERM GOAL #2   Title  Pt  demonstate improved grip strength for ADLS as evidenced by increasing bilateral grip strength by 5lbs.    Time  8    Period  Weeks    Status  New      OT LONG TERM GOAL #3   Title  Pt will demonstrate ability to alternate attention between a physical and cognitive task with 90% or better accuracy, in prep for driving.    Time  8    Period  Weeks    Status  New      OT LONG TERM GOAL #4   Title  Pt will demonstrate improved endurance with pt reporting ability to stand x 45 mins without rest breaks for ADL/IADLS.    Time  8    Period  Weeks    Status  New      OT LONG TERM GOAL #5   Title  Pt will verbalize understanding of ways to manage hand pain and joint protection strategies prn    Time  8    Period  Weeks    Status  New            Plan - 07/28/18 1118    Clinical Impression Statement  Pt progressing towards goals. Pt met 2 STG's at this time.     Occupational Profile and client history currently impacting functional performance  PMH: toxic metabolic encephalopathy, cardiac arrest with resp failure after suspected intentional overdose, dysphagia, tachycardia due to deconditioning, depression, alcohol abuse, Pt i s retired respiratory therapist, she was completely I prior to hospitalization, pt lives with her husband( he works weekend option). She is accompanied today by her son who lives out of town.    Occupational performance deficits (Please refer to evaluation for details):  ADL's;IADL's;Leisure;Social Participation    Rehab Potential  Good    Current Impairments/barriers affecting progress:  cognitive deficits, hx of alcohol abuse, depression, overdose, pain    OT Frequency  2x / week    OT Duration  8 weeks    OT Treatment/Interventions  Self-care/ADL training;Therapeutic exercise;Patient/family education;Neuromuscular education;Moist Heat;Aquatic Therapy;Paraffin;Therapeutic activities;Functional Mobility Training;Energy  conservation;Fluidtherapy;Cryotherapy;Ultrasound;Contrast Bath;DME and/or AE instruction;Manual Therapy;Passive range of motion;Cognitive remediation/compensation    Plan  compensatory strategies for short term memory deficits,  activity tolerance, assess progress towards goals - anticipate pt will not need O.T. much longer    Consulted and Agree with Plan of Care  Family member/caregiver;Patient    Family Member Consulted  husband        Patient will benefit from skilled therapeutic intervention in order to improve the following deficits and impairments:  Abnormal gait, Decreased cognition, Decreased knowledge of use of DME, Pain, Decreased mobility, Decreased activity tolerance, Decreased endurance, Decreased strength, Impaired UE functional use, Impaired perceived functional ability, Decreased safety awareness, Decreased knowledge of precautions  Visit Diagnosis: Frontal lobe and executive function deficit  Pain in joint of right hand  Pain in joint of left hand    Problem List Patient Active Problem List   Diagnosis Date Noted  . Metabolic encephalopathy 93/81/0175  . Depression   . Alcohol abuse   . Leukocytosis   . Acute blood loss anemia   . MDD (major depressive disorder), recurrent severe, without psychosis ([place])   . Overdose 05/23/2018  . Acute hypoxemic respiratory failure ([place])   . Aspiration pneumonia of right lung due to gastric secretions [facility])     Carey Bullocks, OTR/L 07/28/2018, 11:19 AM  [facility] [address] [place], [place], [address] Phone: [phone]   Fax:  [phone]  Name: Courtney Beard MRN: [id number] Date of Birth: Jun 23, 1951

## 2018-07-30 ENCOUNTER — Encounter: Payer: Self-pay | Admitting: Physical Therapy

## 2018-07-30 ENCOUNTER — Ambulatory Visit: Payer: Medicare HMO | Admitting: Physical Therapy

## 2018-07-30 ENCOUNTER — Ambulatory Visit: Payer: Medicare HMO | Admitting: Speech Pathology

## 2018-07-30 ENCOUNTER — Ambulatory Visit: Payer: Medicare HMO | Admitting: Occupational Therapy

## 2018-07-30 DIAGNOSIS — M6281 Muscle weakness (generalized): Secondary | ICD-10-CM

## 2018-07-30 DIAGNOSIS — R2689 Other abnormalities of gait and mobility: Secondary | ICD-10-CM | POA: Diagnosis not present

## 2018-07-30 DIAGNOSIS — R41841 Cognitive communication deficit: Secondary | ICD-10-CM | POA: Diagnosis not present

## 2018-07-30 DIAGNOSIS — M25541 Pain in joints of right hand: Secondary | ICD-10-CM

## 2018-07-30 DIAGNOSIS — R41844 Frontal lobe and executive function deficit: Secondary | ICD-10-CM

## 2018-07-30 DIAGNOSIS — R498 Other voice and resonance disorders: Secondary | ICD-10-CM | POA: Diagnosis not present

## 2018-07-30 DIAGNOSIS — M25542 Pain in joints of left hand: Secondary | ICD-10-CM | POA: Diagnosis not present

## 2018-07-30 NOTE — Patient Instructions (Signed)
After you practice your abdominal breathing for 15 minutes (twice a day), read your words and phrases with that same breathing. If your voice is hoarse, try not to clear your throat. You can swallow, or take a sip of water. If that and a deep breath don't clear your voice, you can try some gentle neck stretches to relax, then resume your abdominal breathing and try again.   Sharyn DrossAlex Jonathan Theo Miles Monique  Kevin Mom Norristownom Carl Maddie [place]ella  Toby Frankie [facility]  Monica Dana Christy Kat Fayrene HelperMeredith Uri [employer] Karin [employer]  [employer] [facility] [place]  Walk the dogs Good morning I love you Good night Feed the dogs I love you, Baldo AshCarl My bucket 'o love

## 2018-07-30 NOTE — Therapy (Signed)
[facility] [address] [place], [place], [address] Phone: [phone]   Fax:  [phone]  Occupational Therapy Treatment  Patient Details  Name: Leonides Schanzlisabetta M Evers MRN: [id number] Date of Birth: 1951-03-22 Referring Provider (OT): Dr. Riley KillSwartz   Encounter Date: 07/30/2018  OT End of Session - 07/30/18 0940    Visit Number  4    Number of Visits  17    Date for OT Re-Evaluation  08/30/18    Authorization Type  [employer] Medicare    Authorization - Visit Number  4    Authorization - Number of Visits  10    OT Start Time  [phone] [id number]    OT Stop Time  1016    OT Time Calculation (min)  38 min    Activity Tolerance  Patient tolerated treatment well    Behavior During Therapy  [facility] for tasks assessed/performed       Past Medical History:  Diagnosis Date  . Acute blood loss anemia 06/2018  . Anemia   . Depression   . H/O ETOH abuse   . Thyroid disease     Past Surgical History:  Procedure Laterality Date  . COLONOSCOPY      There were no vitals filed for this visit.  Subjective Assessment - 07/30/18 0939    Subjective   Pt reports that she is doing a lot of cleaning at home    Pertinent History  Toxic metabolic encephalopathy, septic shock, cardiac arrest with resp. failure after suspected intentional drug overdose, dysphagia, tachycardia due to deconditioning    Patient Stated Goals  resume prior level of function    Currently in Pain?  No/denies                 Treatment:  Organizing your day task, pt. completed including all items, min v.c to all more time for all items.          OT Education/ treatment - 07/30/18 1007    Education Details  memory strategies, yellow putty HEP - see pt instructions   Person(s) Educated  Patient    Methods  Explanation;Demonstration;Handout    Comprehension  Verbalized understanding;Returned demonstration;Verbal cues required       OT Short Term Goals -  07/30/18 1033      OT SHORT TERM GOAL #1   Title  I with HEP     Time  4    Period  Weeks    Status  Achieved   yellow putty HEP     OT SHORT TERM GOAL #2   Title  Pt will perform basic home managment tasks modified independently demonstrating good safety awareness.    Time  4    Period  Weeks    Status  Achieved      OT SHORT TERM GOAL #3   Title  Pt will verbalize understanding of compensatory strategies for short term memory/cognitive deficits.    Time  4    Period  Weeks    Status  On-going      OT SHORT TERM GOAL #4   Title  Pt will perform basic cooking task with supervision demonstrating good safety awareness.    Time  4    Period  Weeks    Status  Achieved      OT SHORT TERM GOAL #5   Title  Pt will report performing home management activities x 30 mins in standing without rest break.    Time  4  Period  Weeks    Status  Achieved      OT SHORT TERM GOAL #6   Title  Pt will demonstrate ability to perform a functional organization task with no more than min v.c (plan a meal and generate a grocery list)     Time  4    Period  Weeks    Status  On-going        OT Long Term Goals - 07/01/18 1633      OT LONG TERM GOAL #1   Title  Pt will perform mod complex cooking and home management at a modified independent level demonstrating good safety awareness.    Time  8    Period  Weeks    Status  New    Target Date  08/30/18      OT LONG TERM GOAL #2   Title  Pt demonstate improved grip strength for ADLS as evidenced by increasing bilateral grip strength by 5lbs.    Time  8    Period  Weeks    Status  New      OT LONG TERM GOAL #3   Title  Pt will demonstrate ability to alternate attention between a physical and cognitive task with 90% or better accuracy, in prep for driving.    Time  8    Period  Weeks    Status  New      OT LONG TERM GOAL #4   Title  Pt will demonstrate improved endurance with pt reporting ability to stand x 45 mins without rest breaks  for ADL/IADLS.    Time  8    Period  Weeks    Status  New      OT LONG TERM GOAL #5   Title  Pt will verbalize understanding of ways to manage hand pain and joint protection strategies prn    Time  8    Period  Weeks    Status  New            Plan - 07/30/18 1652    Clinical Impression Statement  Pt progressing towards goals. Pt demonstrates improving cognition  and understanding of memory strategies.    Occupational Profile and client history currently impacting functional performance  PMH: toxic metabolic encephalopathy, cardiac arrest with resp failure after suspected intentional overdose, dysphagia, tachycardia due to deconditioning, depression, alcohol abuse, Pt i s retired respiratory therapist, she was completely I prior to hospitalization, pt lives with her husband( he works weekend option). She is accompanied today by her son who lives out of town.    Occupational performance deficits (Please refer to evaluation for details):  ADL's;IADL's;Leisure;Social Participation    Rehab Potential  Good    Current Impairments/barriers affecting progress:  cognitive deficits, hx of alcohol abuse, depression, overdose, pain    OT Frequency  2x / week    OT Duration  8 weeks    OT Treatment/Interventions  Self-care/ADL training;Therapeutic exercise;Patient/family education;Neuromuscular education;Moist Heat;Aquatic Therapy;Paraffin;Therapeutic activities;Functional Mobility Training;Energy conservation;Fluidtherapy;Cryotherapy;Ultrasound;Contrast Bath;DME and/or AE instruction;Manual Therapy;Passive range of motion;Cognitive remediation/compensation    Plan  work towards unmet goals, endurance, cognition    Consulted and Agree with Plan of Care  Patient       Patient will benefit from skilled therapeutic intervention in order to improve the following deficits and impairments:  Abnormal gait, Decreased cognition, Decreased knowledge of use of DME, Pain, Decreased mobility, Decreased  activity tolerance, Decreased endurance, Decreased strength, Impaired UE functional use, Impaired perceived functional ability, Decreased  safety awareness, Decreased knowledge of precautions  Visit Diagnosis: Muscle weakness (generalized)  Frontal lobe and executive function deficit  Pain in joint of right hand  Pain in joint of left hand    Problem List Patient Active Problem List   Diagnosis Date Noted  . Metabolic encephalopathy 06/19/2018  . Depression   . Alcohol abuse   . Leukocytosis   . Acute blood loss anemia   . MDD (major depressive disorder), recurrent severe, without psychosis (HCC)   . Overdose 05/23/2018  . Acute hypoxemic respiratory failure (HCC)   . Aspiration pneumonia of right lung due to gastric secretions (HCC)     , 07/30/2018, 4:56 PM  [facility] [address] [place], [place], [address] Phone: [phone]   Fax:  [phone]  Name: KARMA ANSLEY MRN: [id number] Date of Birth: [birth year]

## 2018-07-30 NOTE — Therapy (Signed)
[facility] [address] [place], [place], [address] Phone: [phone]   Fax:  [phone]  Speech Language Pathology Treatment  Patient Details  Name: Courtney Beard MRN: [id number] Date of Birth: 30-Sep-1950 Referring Provider (SLP): Dr. Riley KillSwartz   Encounter Date: 07/30/2018  End of Session - 07/30/18 0846    Visit Number  3    Number of Visits  9    Date for SLP Re-Evaluation  09/11/18    Authorization Type  [employer] Medicare 1 copay if multiple visits on same day    SLP Start Time  0803    SLP Stop Time   0845    SLP Time Calculation (min)  42 min    Activity Tolerance  Patient tolerated treatment well       Past Medical History:  Diagnosis Date  . Acute blood loss anemia 06/2018  . Anemia   . Depression   . H/O ETOH abuse   . Thyroid disease     Past Surgical History:  Procedure Laterality Date  . COLONOSCOPY      There were no vitals filed for this visit.  Subjective Assessment - 07/30/18 0806    Subjective  "My son Courtney Beard commented on my voice."    Patient is accompained by:  Family member   husband   Currently in Pain?  No/denies            ADULT SLP TREATMENT - 07/30/18 0806      General Information   Behavior/Cognition  Alert;Cooperative      Treatment Provided   Treatment provided  Cognitive-Linquistic      Pain Assessment   Pain Assessment  No/denies pain      Cognitive-Linquistic Treatment   Treatment focused on  Voice    Skilled Treatment  (speech tx: 29 min) Pt reports attempting to reduce throat clearing and increase use of confidential voice, drinking water. Reports practicing abdominal breathing (AB) at rest and when having a conversation with her son, who remarked on her improved vocal quality. Pt continues with harsh throat clears today, though fewer than previous session (x5 today over whole session). Pt initial accuracy with AB at rest was 75%; with min  cues/instruction pt reduced upper body tension and AB was 90% accurate over 2 minute period. Progressed to words/phrases; pt generated personally relevant terms with occasional min-mod A to reduce laryngeal focus and use abdominal breathing. Trained pt in gentle stretches for accessory muscles (neck and shoulders). Pt to practice her list of words/phrases at home after AB at rest. Cognitive tx (13 minutes): Pt told SLP she is bothered by her memory problems, stating she sometimes forgets what she is going to do by the time she leaves a room. SLP discussed ideas for a memory compensation system with pt and her husband. She uses a wall calendar at home. Pt agreed that it would be helpful to carry a notebook with her and to start to write down information such as appointments and to do lists. Pt to bring with her to next session.      Assessment / Recommendations / Plan   Plan  Continue with current plan of care      Progression Toward Goals   Progression toward goals  Progressing toward goals           SLP Long Term Goals - 07/30/18 [id number]      SLP LONG TERM GOAL #1   Title  Pt will use a memory  compensation system to record, reference and organize daily activities/appointments x3 sessions     Time  4    Period  Weeks    Status  On-going      SLP LONG TERM GOAL #2   Title  Pt will report carryover of 3 vocal hygiene strategies over 3 visits.     Baseline  07/30/18    Time  4    Period  Weeks    Status  On-going      SLP LONG TERM GOAL #3   Title  Pt will use abdominal breathing in 8 minutes simple conversation 85% accuracy.    Time  4    Period  Weeks    Status  On-going       Plan - 07/30/18 [id number]    Clinical Impression Statement  Courtney Beard presents with mild cognitive communication impairment; pt reports most difficulties with short and also long-term memory (remembering past vacations or family events). Pt/husband feel pt is improving cognitively but is not at her functional  baseline. Voice remains hoarse, low-intensity, but with significant improvements in quality in word and phrase level tasks with abdominal breathing. Improving awareness of throat clearing. Pt would benefit from skilled ST to address cognitive deficits and vocal quality in order to improve independence/schedule management and communication at home and in the community. Will address vocal hygiene and abdominal breathing, consider vocal function exercises if appropriate per ENT.     Speech Therapy Frequency  2x / week    Duration  4 weeks   or 9 total visits   Treatment/Interventions  Cognitive reorganization;Compensatory strategies;Compensatory techniques;Cueing hierarchy;Internal/external aids;Functional tasks;Patient/family education;SLP instruction and feedback    Potential to Achieve Goals  Good       Patient will benefit from skilled therapeutic intervention in order to improve the following deficits and impairments:   Other voice and resonance disorders  Cognitive communication deficit    Problem List Patient Active Problem List   Diagnosis Date Noted  . Metabolic encephalopathy 06/19/2018  . Depression   . Alcohol abuse   . Leukocytosis   . Acute blood loss anemia   . MDD (major depressive disorder), recurrent severe, without psychosis (HCC)   . Overdose 05/23/2018  . Acute hypoxemic respiratory failure (HCC)   . Aspiration pneumonia of right lung due to gastric secretions [facility](HCC)    Rondel BatonMary Beth Hadlyn Amero, MS, CCC-SLP Speech-Language Pathologist  Courtney Beard 07/30/2018, 9:02 AM  [facility] [address] [place], [place], [address] Phone: [phone]   Fax:  [phone]   Name: Courtney Beard MRN: [id number] Date of Birth: 12/28/1950

## 2018-07-30 NOTE — Therapy (Signed)
[facility] [address] [place], [place], [address] Phone: [phone]   Fax:  [phone]  Physical Therapy Treatment  Patient Details  Name: Courtney Beard MRN: [id number] Date of Birth: March 09, 1951 Referring Provider (PT): Faith RogueSwartz, Zachary   Encounter Date: 07/30/2018  PT End of Session - 07/30/18 2016    Visit Number  4    Number of Visits  9    Date for PT Re-Evaluation  09/11/18    Authorization Type  [employer]na Medicare; 1 copay if multiple disciplines on same day    PT Start Time  0848    PT Stop Time  0927    PT Time Calculation (min)  39 min    Activity Tolerance  Patient tolerated treatment well    Behavior During Therapy  [facility] for tasks assessed/performed       Past Medical History:  Diagnosis Date  . Acute blood loss anemia 06/2018  . Anemia   . Depression   . H/O ETOH abuse   . Thyroid disease     Past Surgical History:  Procedure Laterality Date  . COLONOSCOPY      There were no vitals filed for this visit.  Subjective Assessment - 07/30/18 0851    Subjective  Did not get to the yoga class as her husband is not feeling well. And she is not yet driving.     Pertinent History  Toxic metabolic encephalopathy, septic shock, cardiac arrest with resp. failure after suspected intentional drug overdose, dysphagia, tachycardia due to deconditioning     Patient Stated Goals  The only thing I'd like to work on is my stamina.    Currently in Pain?  No/denies                       [facility] Adult PT Treatment/Exercise - 07/30/18 0001      Knee/Hip Exercises: Aerobic   Tread Mill  total time 15 minutes: forward up to 3.0 mph with 5% elevation; sideways with single UE support (to each side with stepping to her right much more difficult due to ?perceptaual issue); to backwards           Balance Exercises - 07/30/18 0929      Balance Exercises: Standing   Standing Eyes Closed  Narrow  base of support (BOS);Head turns;Foam/compliant surface   1/2 tandem on blue beam, head turns x 10 each   SLS with Vectors  Foam/compliant surface   on blue airex, alt taps to tall yoga blk; bil shdr flex   Tandem Gait  Forward;Retro;Intermittent upper extremity support;Foam/compliant surface    Sidestepping  Foam/compliant support;Upper extremity support   6 lengths-4 in crouched position; all on lbue beam    Marching Limitations  on blue beam foam in // bars     sit to stand with feet on 1" foam x 5 reps EO and 5 reps EC without imbalance   PT Education - 07/30/18 2017    Education Details  pt educated she can find LOTs of YouTube yoga videos she can do at home if husband cannot take her to the gym    Person(s) Educated  Patient    Methods  Explanation;Handout    Comprehension  Verbalized understanding          PT Long Term Goals - 07/13/18 1558      PT LONG TERM GOAL #1   Title  Pt will be independent with HEP for improved gait  and strengthening.  TARGET 08/14/18 (moved one week due to pt being out of town)    Time  4    Period  Weeks    Status  New    Target Date  08/14/18      PT LONG TERM GOAL #2   Title  Pt will improve 6 MWT by at least 150 ft  for improved gait efficiency and safety in community.    Time  4    Period  Weeks    Status  New    Target Date  08/14/18      PT LONG TERM GOAL #3   Title  Pt will verbalize plans for continued community fitness upon d/c from PT.    Time  4    Period  Weeks    Status  New    Target Date  09/10/18            Plan - 07/30/18 2018    Clinical Impression Statement  Session focused on gait training for balance and endurance, including multi-directional walking on treadmill. Balance training on compliant surfaces with EO and head movment vs EC with head movment. At end of session discussed pt's excellent progress and likely will not need all visits originally planned for. Pt in agreement to look at LTGs next visit with  ?discharge.     Rehab Potential  Good    Clinical Impairments Affecting Rehab Potential  motivated for therapy; family supportive; medical event precipitated by possible intentional overdose    PT Frequency  2x / week    PT Duration  4 weeks   plus eval, but may d/c early based on progress/level of function   PT Treatment/Interventions  Gait training;ADLs/Self Care Home Management;Therapeutic activities;Functional mobility training;Therapeutic exercise;Patient/family education;Balance training;Neuromuscular re-education    PT Next Visit Plan  chk LTGs and possibly d/c    Consulted and Agree with Plan of Care  Patient    Family Member Consulted          Patient will benefit from skilled therapeutic intervention in order to improve the following deficits and impairments:  Decreased activity tolerance, Decreased strength, Decreased mobility  Visit Diagnosis: Muscle weakness (generalized)  Other abnormalities of gait and mobility     Problem List Patient Active Problem List   Diagnosis Date Noted  . Metabolic encephalopathy 06/19/2018  . Depression   . Alcohol abuse   . Leukocytosis   . Acute blood loss anemia   . MDD (major depressive disorder), recurrent severe, without psychosis (HCC)   . Overdose 05/23/2018  . Acute hypoxemic respiratory failure (HCC)   . Aspiration pneumonia of right lung due to gastric secretions (HCC)     Zena AmosLynn P Lacey Dotson, PT 07/30/2018, 8:23 PM  [facility] [address] [place], [place], [address] Phone: [phone]   Fax:  [phone]  Name: Courtney Schanzlisabetta M Latorre MRN: [id number] Date of Birth: 06-29-1951

## 2018-07-30 NOTE — Patient Instructions (Addendum)
Memory Compensation Strategies  1. Use "WARM" strategy.  W= write it down  A= associate it  R= repeat it  M= make a mental note  2.   You can keep a Memory Notebook.  Use a 3-ring notebook with sections for the following: calendar, important names and phone numbers,  medications, doctors' names/phone numbers, lists/reminders, and a section to journal what you did each day.   3.    Use a calendar to write appointments down.  4.    Write yourself a schedule for the day.  This can be placed on the calendar or in a separate section of the Memory Notebook.  Keeping a regular schedule can help memory.  5.    Use medication organizer with sections for each day or morning/evening pills.  You may need help loading it  6.    Keep a basket, or pegboard by the door.  Place items that you need to take out with you in the basket or on the pegboard.  You may also want to  include a message board for reminders.  7.    Use sticky notes.  Place sticky notes with reminders in a place where the task is performed.  For example: " turn off the  stove" placed by the stove, "lock the door" placed on the door at eye level, " take your medications" on  the bathroom mirror or by the place where you normally take your medications.  8.    Use alarms/timers.  Use while cooking to remind yourself to check on food or as a reminder to take your medicine, or as a  reminder to make a call, or as a reminder to perform another task, etc. 1. Grip Strengthening (Resistive Putty)   Squeeze putty using thumb and all fingers. Repeat _20___ times. Do __2__ sessions per day.   2. Roll putty into tube on table and pinch between each finger and thumb x 10 reps each. (can do ring and small finger together)     Copyright  [employer]. All rights reserved.    

## 2018-08-03 ENCOUNTER — Encounter: Payer: Self-pay | Admitting: Physical Therapy

## 2018-08-03 ENCOUNTER — Ambulatory Visit: Payer: Medicare HMO | Admitting: Speech Pathology

## 2018-08-03 ENCOUNTER — Ambulatory Visit: Payer: Medicare HMO | Admitting: Physical Therapy

## 2018-08-03 DIAGNOSIS — R41844 Frontal lobe and executive function deficit: Secondary | ICD-10-CM | POA: Diagnosis not present

## 2018-08-03 DIAGNOSIS — M6281 Muscle weakness (generalized): Secondary | ICD-10-CM | POA: Diagnosis not present

## 2018-08-03 DIAGNOSIS — R41841 Cognitive communication deficit: Secondary | ICD-10-CM | POA: Diagnosis not present

## 2018-08-03 DIAGNOSIS — R2689 Other abnormalities of gait and mobility: Secondary | ICD-10-CM | POA: Diagnosis not present

## 2018-08-03 DIAGNOSIS — M25542 Pain in joints of left hand: Secondary | ICD-10-CM | POA: Diagnosis not present

## 2018-08-03 DIAGNOSIS — M25541 Pain in joints of right hand: Secondary | ICD-10-CM | POA: Diagnosis not present

## 2018-08-03 DIAGNOSIS — R498 Other voice and resonance disorders: Secondary | ICD-10-CM

## 2018-08-03 NOTE — Patient Instructions (Addendum)
Continue using your notebook; remember to bring it with you next time you come to therapy.  Continue reviewing some of your family "books" of trips. As you remember details or stories from trips, try journaling about them.

## 2018-08-03 NOTE — Therapy (Signed)
[facility] [address] [place], [place], [address] Phone: [phone]   Fax:  [phone]  Physical Therapy Treatment  Patient Details  Name: Courtney Beard MRN: [id number] Date of Birth: August 13, 1950 Referring Provider (PT): Alger Simons   Encounter Date: 08/03/2018  PT End of Session - 08/03/18 1455    Visit Number  5    Number of Visits  9    Date for PT Re-Evaluation  09/11/18    Authorization Type  [employer]; 1 copay if multiple disciplines on same day    PT Start Time  0852    PT Stop Time  0932    PT Time Calculation (min)  40 min    Activity Tolerance  Patient tolerated treatment well    Behavior During Therapy  [facility] for tasks assessed/performed       Past Medical History:  Diagnosis Date  . Acute blood loss anemia 06/2018  . Anemia   . Depression   . H/O ETOH abuse   . Thyroid disease     Past Surgical History:  Procedure Laterality Date  . COLONOSCOPY      There were no vitals filed for this visit.  Subjective Assessment - 08/03/18 0855    Subjective  Will be a quiet holiday, as my husband will be working.  Balance and walking is "excellent".    Pertinent History  Toxic metabolic encephalopathy, septic shock, cardiac arrest with resp. failure after suspected intentional drug overdose, dysphagia, tachycardia due to deconditioning     Patient Stated Goals  The only thing I'd like to work on is my stamina.    Currently in Pain?  No/denies         [facility] PT Assessment - 08/03/18 0859      6 Minute Walk- Baseline   6 Minute Walk- Baseline  yes    HR (bpm)  77    02 Sat (%RA)  99 %      6 Minute walk- Post Test   6 Minute Walk Post Test  yes    HR (bpm)  80    02 Sat (%RA)  95 %    Modified Borg Scale for Dyspnea  0- Nothing at all    Perceived Rate of Exertion (Borg)  7- Very, very light      6 minute walk test results    Aerobic Endurance Distance Walked  1478     Endurance additional comments  improved by 140 ft from assessment                    [facility] Adult PT Treatment/Exercise - 08/03/18 0001      High Level Balance   High Level Balance Comments  Reviewed HEP-heel/toe raises x 10 reps, squats on solid surface, then squats on foam x 5 reps each, partial tandem stance on foam, SLS on foam with EC-pt return demo understanding.        Self-Care   Self-Care  Other Self-Care Comments    Other Self-Care Comments   Discussed POC, progress towards goals, results of SOT, improvements made in therapy and plans for d/c.  Pt verbalizes community fitness plans-continueing HEP, walking at gym several times per week.  Pt is appropriate for d/c this visit.        Sensory Organization Test: Composite score 73/100 (improved from 61/100) and improved to WNL Somatosensory, Visual, and Vestibular systems WNL (improved from all being below normal limits)  PT Education - 08/03/18 1454    Education Details  Progress towards goals, results of Sensory Organization test, POC and plans for d/c this visit.    Person(s) Educated  Patient    Methods  Explanation    Comprehension  Verbalized understanding          PT Long Term Goals - 08/03/18 0857      PT LONG TERM GOAL #1   Title  Pt will be independent with HEP for improved gait and strengthening.  TARGET 08/14/18 (moved one week due to pt being out of town)    Time  4    Period  Weeks    Status  Achieved      PT LONG TERM GOAL #2   Title  Pt will improve 6 MWT by at least 150 ft  for improved gait efficiency and safety in community.    Time  4    Period  Weeks    Status  Partially Met      PT LONG TERM GOAL #3   Title  Pt will verbalize plans for continued community fitness upon d/c from PT.    Baseline  [employer] 3x/wk    Time  4    Period  Weeks    Status  Achieved            Plan - 08/03/18 1455    Clinical Impression Statement  Pt feeling she is improving with gait and  balance, no falls since one fall where dog pulled her over.  Pt has met LTG 1 and 3, LTG 2 partially met, with improved 6 MWT by 140 ft since eval.  Also reassessed Sensory Organization test, with pt improving composite score to [facility].  Pt is pleased with her progress from PT and agrees to discharge.    Rehab Potential  Good    Clinical Impairments Affecting Rehab Potential  motivated for therapy; family supportive; medical event precipitated by possible intentional overdose    PT Frequency  2x / week    PT Duration  4 weeks   plus eval, but may d/c early based on progress/level of function   PT Treatment/Interventions  Gait training;ADLs/Self Care Home Management;Therapeutic activities;Functional mobility training;Therapeutic exercise;Patient/family education;Balance training;Neuromuscular re-education    PT Next Visit Plan  Plan to discharge this visit.    Consulted and Agree with Plan of Care  Patient    Family Member Consulted          Patient will benefit from skilled therapeutic intervention in order to improve the following deficits and impairments:  Decreased activity tolerance, Decreased strength, Decreased mobility  Visit Diagnosis: Other abnormalities of gait and mobility     Problem List Patient Active Problem List   Diagnosis Date Noted  . Metabolic encephalopathy 28/63/8177  . Depression   . Alcohol abuse   . Leukocytosis   . Acute blood loss anemia   . MDD (major depressive disorder), recurrent severe, without psychosis ([place])   . Overdose 05/23/2018  . Acute hypoxemic respiratory failure ([place])   . Aspiration pneumonia of right lung due to gastric secretions ([place])     Chandlor Noecker W. 08/03/2018, 3:00 PM  Frazier Butt., PT   [facility] [address] [place], [place], [address] Phone: [phone]   Fax:  [phone]  Name: NARY SNEED MRN: [id number] Date of Birth: [birth year]

## 2018-08-03 NOTE — Therapy (Signed)
[facility] [address] [place], [place], [address] Phone: [phone]   Fax:  [phone]  Speech Language Pathology Treatment  Patient Details  Name: Courtney Beard MRN: [id number] Date of Birth: [birth year] Referring Provider (SLP): Dr. Riley KillSwartz   Encounter Date: 08/03/2018  End of Session - 08/03/18 0849    Visit Number  4    Number of Visits  9    Date for SLP Re-Evaluation  09/11/18    SLP Start Time  0804    SLP Stop Time   0844    SLP Time Calculation (min)  40 min    Activity Tolerance  Patient tolerated treatment well       Past Medical History:  Diagnosis Date  . Acute blood loss anemia 06/2018  . Anemia   . Depression   . H/O ETOH abuse   . Thyroid disease     Past Surgical History:  Procedure Laterality Date  . COLONOSCOPY      There were no vitals filed for this visit.  Subjective Assessment - 08/03/18 0806    Subjective  "I want you to know I had coffee with my friends and they said my voice sounds a lot better."    Currently in Pain?  No/denies            ADULT SLP TREATMENT - 08/03/18 0808      General Information   Behavior/Cognition  Alert;Cooperative    Patient Positioning  Upright in chair      Treatment Provided   Treatment provided  Cognitive-Linquistic      Pain Assessment   Pain Assessment  No/denies pain      Cognitive-Linquistic Treatment   Treatment focused on  Voice    Skilled Treatment  (speech tx, 30 min) Pt reports great improvement in her voice, rates it as 8/10, with 10 being her "normal voice". She is using abdominal breathing (AB) in conversation ~60% accuracy upon entering, still with some chest-centered breathing and speaking on residual capacity. SLP cued pt for AB, breath pacing in sentences (occasional min-mod A), and for swallowing or breathing again vs throat clearing to improve vocal quality. Pt demo'd awareness, visibly suppressing throat  clears in subsequent conversations, 8-10 minutes, with AB accuracy ~80%. (cognitive tx, 10 min). Pt did not bring her notebook but reports this has been helping her with recall at home. Pt remains concerned about long-term memory, stating she reviewed family albums of trips and was amazed that she did not initially remember some events, though started to recall some details. SLP encouraged pt to continue to review albums at home and to try journaling about some of her memories. Recall of upcoming appointments (hairdresser, therapy) with occasional min A for specific details (which discipline/therapist).      Assessment / Recommendations / Plan   Plan  Continue with current plan of care      Progression Toward Goals   Progression toward goals  Progressing toward goals           SLP Long Term Goals - 08/03/18 1243      SLP LONG TERM GOAL #1   Title  Pt will use a memory compensation system to record, reference and organize daily activities/appointments x3 sessions     Time  3    Period  Weeks    Status  On-going      SLP LONG TERM GOAL #2   Title  Pt will report carryover of 3 vocal  hygiene strategies over 3 visits.     Baseline  07/30/18, 08/03/18    Time  3    Status  On-going      SLP LONG TERM GOAL #3   Title  Pt will use abdominal breathing in 8 minutes simple conversation 85% accuracy.    Time  3    Period  Weeks    Status  On-going       Plan - 08/03/18 1244    Clinical Impression Statement  Pt reports improving memory and voice; she rates her voice as 8/10 today with 10 being her "normal" voice. Vocal quality and endurance improved today, with mod hoarseness intermittently. Suppressed throat clears several times in conversation after instruction. Pt making good progress and indicates she feels she is making good progress toward her baseline. She continues to report decreased quality of life due to memory issues. Pt would benefit from skilled ST to address cognitive deficits  and vocal quality in order to improve independence/schedule management and communication at home and in the community. Will address vocal hygiene and abdominal breathing, consider vocal function exercises if appropriate per ENT.     Speech Therapy Frequency  2x / week    Duration  4 weeks   or 9 total visits   Treatment/Interventions  Cognitive reorganization;Compensatory strategies;Compensatory techniques;Cueing hierarchy;Internal/external aids;Functional tasks;Patient/family education;SLP instruction and feedback    Potential to Achieve Goals  Good    Potential Considerations  Financial resources       Patient will benefit from skilled therapeutic intervention in order to improve the following deficits and impairments:   Other voice and resonance disorders  Cognitive communication deficit    Problem List Patient Active Problem List   Diagnosis Date Noted  . Metabolic encephalopathy 06/19/2018  . Depression   . Alcohol abuse   . Leukocytosis   . Acute blood loss anemia   . MDD (major depressive disorder), recurrent severe, without psychosis ([facility])   . Overdose 05/23/2018  . Acute hypoxemic respiratory failure ([facility])   . Aspiration pneumonia of right lung due to gastric secretions [facility](HCC)    Rondel BatonMary Beth Rashun Grattan, MS, CCC-SLP Speech-Language Pathologist  Arlana LindauMary E Jesyca Weisenburger 08/03/2018, 12:48 PM  [facility] [address] [place], [place], [address] Phone: [phone]   Fax:  [phone]   Name: Courtney Beard MRN: [id number] Date of Birth: 04/05/1951

## 2018-08-04 ENCOUNTER — Ambulatory Visit: Payer: Self-pay | Admitting: Physical Therapy

## 2018-08-06 ENCOUNTER — Encounter: Payer: Self-pay | Admitting: Occupational Therapy

## 2018-08-10 ENCOUNTER — Ambulatory Visit: Payer: Medicare HMO | Admitting: Speech Pathology

## 2018-08-10 ENCOUNTER — Ambulatory Visit: Payer: Medicare HMO | Admitting: Occupational Therapy

## 2018-08-10 ENCOUNTER — Telehealth (HOSPITAL_COMMUNITY): Payer: Self-pay | Admitting: Psychology

## 2018-08-10 ENCOUNTER — Encounter: Payer: Self-pay | Admitting: Speech Pathology

## 2018-08-13 ENCOUNTER — Encounter: Payer: Self-pay | Admitting: Speech Pathology

## 2018-08-13 ENCOUNTER — Encounter: Payer: Self-pay | Admitting: Occupational Therapy

## 2018-08-13 ENCOUNTER — Ambulatory Visit: Payer: Self-pay | Admitting: Physical Therapy

## 2018-08-17 ENCOUNTER — Encounter: Payer: Self-pay | Admitting: Speech Pathology

## 2018-08-18 ENCOUNTER — Encounter: Payer: Medicare HMO | Attending: Registered Nurse | Admitting: Physical Medicine & Rehabilitation

## 2018-08-18 ENCOUNTER — Encounter: Payer: Self-pay | Admitting: Physical Medicine & Rehabilitation

## 2018-08-18 VITALS — BP 111/72 | HR 66 | Resp 14 | Ht 62.0 in | Wt 101.0 lb

## 2018-08-18 DIAGNOSIS — F329 Major depressive disorder, single episode, unspecified: Secondary | ICD-10-CM | POA: Insufficient documentation

## 2018-08-18 DIAGNOSIS — G9341 Metabolic encephalopathy: Secondary | ICD-10-CM | POA: Diagnosis not present

## 2018-08-18 DIAGNOSIS — Z87891 Personal history of nicotine dependence: Secondary | ICD-10-CM | POA: Insufficient documentation

## 2018-08-18 DIAGNOSIS — T50902A Poisoning by unspecified drugs, medicaments and biological substances, intentional self-harm, initial encounter: Secondary | ICD-10-CM | POA: Insufficient documentation

## 2018-08-18 DIAGNOSIS — E079 Disorder of thyroid, unspecified: Secondary | ICD-10-CM | POA: Insufficient documentation

## 2018-08-18 DIAGNOSIS — R69 Illness, unspecified: Secondary | ICD-10-CM | POA: Diagnosis not present

## 2018-08-18 NOTE — Progress Notes (Signed)
Subjective:    Patient ID: Courtney Beard, female    DOB: 1951/04/14, 68 y.o.   MRN: [id number]  HPI   Courtney Beard is here in follow-up of her metabolic encephalopathy.  She reports ongoing improvements.  She has interest in having her driving restrictions removed. She reports improvement in her balance and stamina. She completed outpt therapies. She also did some exercising at the [facility] but hasn't gone back because she doesn't want to inconvenience others. She is walking her dogs.   She does report some issues with her memory especially for long term information. She has had less problem with day to day information   Her hands remain an ongoing issue as related to her arthritis.   Her voice had been improving until she came down with an upper respiratory virus.   I asked her about her mood, and she said that she feels very upbeat.  She has been very positive.  She talked to her sons and feels that this whole event may have been out of more her alcohol abuse then intent to harm herself.  She is no longer drinking.  She has contemplating going to AA but is hesitant to be part of "that crowd".  She is eager to get out into the community, get back to the [employer], etc.  Her family is very supportive of her.         Pain Inventory Average Pain 8 Pain Right Now 1 My pain is dull, stabbing and aching  In the last 24 hours, has pain interfered with the following? General activity 1 Relation with others 0 Enjoyment of life 0 What TIME of day is your pain at its worst? morning Sleep (in general) Good  Pain is worse with: unsure Pain improves with: heat/ice and medication Relief from Meds: 8  Mobility walk without assistance ability to climb steps?  yes do you drive?  no  Function retired  Neuro/Psych loss of taste or smell  Prior Studies Any changes since last visit?  no  Physicians involved in your care Any changes since last visit?  no   History reviewed.  No pertinent family history. Social History   Socioeconomic History  . Marital status: Married    Spouse name: Not on file  . Number of children: Not on file  . Years of education: Not on file  . Highest education level: Not on file  Occupational History  . Not on file  Social Needs  . Financial resource strain: Not on file  . Food insecurity:    Worry: Not on file    Inability: Not on file  . Transportation needs:    Medical: Not on file    Non-medical: Not on file  Tobacco Use  . Smoking status: Former Games developer  . Smokeless tobacco: Never Used  . Tobacco comment: quit smoking in 2002  Substance and Sexual Activity  . Alcohol use: Yes    Comment: " occasional "  . Drug use: Never  . Sexual activity: Not on file  Lifestyle  . Physical activity:    Days per week: Not on file    Minutes per session: Not on file  . Stress: Not on file  Relationships  . Social connections:    Talks on phone: Not on file    Gets together: Not on file    Attends religious service: Not on file    Active member of club or organization: Not on file  Attends meetings of clubs or organizations: Not on file    Relationship status: Not on file  Other Topics Concern  . Not on file  Social History Narrative   Courtney Beard resides with spouse. She had two sons. Before admission she was driving and independent.    Past Surgical History:  Procedure Laterality Date  . COLONOSCOPY     Past Medical History:  Diagnosis Date  . Acute blood loss anemia 06/2018  . Anemia   . Depression   . H/O ETOH abuse   . Thyroid disease    BP 111/72   Pulse 66   Resp 14   Ht 5\' 2"  (1.575 m)   Wt 101 lb (45.8 kg)   SpO2 97%   BMI 18.47 kg/m   Opioid Risk Score:   Fall Risk Score:  `1  Depression screen PHQ 2/9  No flowsheet data found.  Review of Systems  Constitutional: Negative.   HENT: Negative.   Eyes: Negative.   Respiratory: Negative.   Cardiovascular: Negative.   Gastrointestinal:  Negative.   Endocrine: Negative.   Genitourinary: Negative.   Musculoskeletal: Positive for arthralgias.  Skin: Negative.   Allergic/Immunologic: Negative.   Neurological: Negative.   Hematological: Negative.   Psychiatric/Behavioral: Negative.   All other systems reviewed and are negative.      Objective:   Physical Exam General: No acute distress HEENT: EOMI, oral membranes moist, dysphonic slightly Cards: reg rate  Chest: normal effort Abdomen: Soft, NT, ND Skin: dry, intact Extremities: no edema Neuro: a.ert and oriented x3. Functional cognition and memory today Normal Muscle Bulk and Muscle Testing Reveals:  Upper Extremities: Full ROM and Muscle Strength 5/5 Lower Extremities: Full ROM and Muscle Strength 5/5  gait is normal. Musc:   Skin: Skin is warm and dry.  Psychiatric: pleasant and up beat.         Assessment & Plan:  1. Metabolic Encephalopathy: generally resolved.   -return to driving was discussed. Plan presented to patient  -discussed memory/aides/routine to help boost cognitive function. She is certainly competent to drive locally.  2. Intentional Drug Overdose: see below, ?related to etoh intoxication 3. Depression: Continue Lexapro.   -follow up with Behavior health recommended re: mood/etoh  -discussed AA   -reviewed etoh use with patient.   4. Dysphonia: ENT follow up if no improvement.  15  minutes of face to face patient care time was spent during this visit. All questions were encouraged and answered. Follow up with me PRN

## 2018-08-18 NOTE — Patient Instructions (Addendum)
PLEASE FEEL FREE TO CALL OUR OFFICE WITH ANY PROBLEMS OR QUESTIONS [phone])   RETURN TO DRIVING PLAN:  WITH THE SUPERVISION OF A LICENSED DRIVER, PLEASE DRIVE IN AN EMPTY PARKING LOT FOR AT LEAST 2-3 TRIALS TO TEST REACTION TIME, VISION, USE OF EQUIPMENT IN CAR, ETC.  IF SUCCESSFUL WITH THE PARKING LOT DRIVING, PROCEED TO SUPERVISED DRIVING TRIALS IN YOUR NEIGHBORHOOD STREETS AT LOW TRAFFIC TIMES TO TEST OBSERVATION TO TRAFFIC SIGNALS, REACTION TIME, ETC. PLEASE ATTEMPT AT LEAST 2-3 TRIALS IN YOUR NEIGHBORHOOD.  IF NEIGHBORHOOD DRIVING IS SUCCESSFUL, YOU MAY PROCEED TO DRIVING IN BUSIER AREAS IN YOUR COMMUNITY WITH SUPERVISION OF A LICENSED DRIVER. PLEASE ATTEMPT AT LEAST 4-5 TRIALS.  IF COMMUNITY DRIVING IS SUCCESSFUL, YOU MAY PROCEED TO DRIVING ALONE, DURING THE DAY TIME, IN NON-PEAK TRAFFIC TIMES. YOU SHOULD DRIVE NO FURTHER THAN 30 MINUTES IN ONE DIRECTION. PLEASE DO NOT DRIVE IF YOU FEEL FATIGUED OR UNDER THE INFLUENCE OF MEDICATION.    DR. SUI TEOH: ENT

## 2018-08-20 ENCOUNTER — Encounter: Payer: Self-pay | Admitting: Speech Pathology

## 2018-08-21 ENCOUNTER — Other Ambulatory Visit: Payer: Self-pay | Admitting: Physical Medicine & Rehabilitation

## 2018-09-22 DIAGNOSIS — R490 Dysphonia: Secondary | ICD-10-CM | POA: Diagnosis not present

## 2018-09-22 DIAGNOSIS — K219 Gastro-esophageal reflux disease without esophagitis: Secondary | ICD-10-CM | POA: Diagnosis not present

## 2018-09-28 DIAGNOSIS — Z008 Encounter for other general examination: Secondary | ICD-10-CM | POA: Diagnosis not present

## 2018-10-01 DIAGNOSIS — R69 Illness, unspecified: Secondary | ICD-10-CM | POA: Diagnosis not present

## 2018-12-29 ENCOUNTER — Telehealth: Payer: Self-pay | Admitting: *Deleted

## 2018-12-29 ENCOUNTER — Encounter: Payer: Self-pay | Admitting: *Deleted

## 2018-12-29 NOTE — Telephone Encounter (Signed)
LVM requesting call back to update EMR.  

## 2018-12-29 NOTE — Telephone Encounter (Signed)
Received call back from patient, updated EMR.  

## 2018-12-30 ENCOUNTER — Other Ambulatory Visit: Payer: Self-pay

## 2018-12-30 ENCOUNTER — Encounter: Payer: Self-pay | Admitting: Diagnostic Neuroimaging

## 2018-12-30 ENCOUNTER — Ambulatory Visit (INDEPENDENT_AMBULATORY_CARE_PROVIDER_SITE_OTHER): Payer: Medicare HMO | Admitting: Diagnostic Neuroimaging

## 2018-12-30 DIAGNOSIS — R413 Other amnesia: Secondary | ICD-10-CM | POA: Diagnosis not present

## 2018-12-30 NOTE — Progress Notes (Signed)
[facility]  PATIENT: Courtney Beard DOB: 1951/05/16  REFERRING CLINICIAN: Erby Pian Beard HISTORY FROM: patient  REASON FOR VISIT: new consult    HISTORICAL  CHIEF COMPLAINT:  Chief Complaint  Patient presents with  . Memory Loss    HISTORY OF PRESENT ILLNESS:   68 year old female with history of alcohol abuse, intentional overdose, cardiac arrest, prolonged ICU stay (October 2019), here for evaluation of memory loss.  Patient was developing some mild memory loss prior to her overdose in October 2019.  She was abusing alcohol at the time.  She also had depression at that time.  Since her ICU admission her memory loss been worse.  She has difficulty focusing on reading and remembering things.  She is able to maintain most of her ADLs.  Patient has family history of dementia in both of her parents.  The parents she had genetic testing with increased predisposition for herself, as part of a research study.     REVIEW OF SYSTEMS: Full 14 system review of systems performed and negative with exception of: As per HPI.  ALLERGIES: Allergies  Allergen Reactions  . Erythromycin Itching    HOME MEDICATIONS: Outpatient Medications Prior to Visit  Medication Sig Dispense Refill  . acetaminophen (TYLENOL) 325 MG tablet Take 2 tablets (650 mg total) by mouth every 6 (six) hours as needed for fever.    Marland Kitchen. b complex vitamins capsule Take 1 capsule by mouth daily.    . Cholecalciferol (VITAMIN D3) 125 MCG (5000 UT) CAPS Take by mouth.    . Coenzyme Q10 (CO Q10) 100 MG CAPS Take by mouth.    . escitalopram (LEXAPRO) 20 MG tablet Take 1 tablet (20 mg total) by mouth daily. 30 tablet 1  . Ferrous Sulfate (IRON PO) Take 65 mg by mouth daily.    . Grape Seed Extract 100 MG CAPS Take 4 capsules by mouth.    . Multiple Vitamins-Minerals (MULTI-DAY PLUS MINERALS PO) Take 1 tablet by mouth daily.    . Omega-3 Fatty Acids (FISH OIL MAXIMUM STRENGTH) 1200 MG CPDR Take 1,200  capsules by mouth 2 (two) times daily.    Marland Kitchen. Specialty Vitamins Products (COLLAGEN ULTRA) CAPS Take by mouth.    Marland Kitchen. UNABLE TO FIND Med Name: tumeric 500  Mg daily     No facility-administered medications prior to visit.     PAST MEDICAL HISTORY: Past Medical History:  Diagnosis Date  . Acute blood loss anemia 06/2018  . Allergic rhinitis   . Anemia   . Depression   . Depression   . Diverticulitis   . H/O ETOH abuse    quit 01/2018  . Headache   . Memory changes   . Osteopenia   . Thyroid disease     PAST SURGICAL HISTORY: Past Surgical History:  Procedure Laterality Date  . APPENDECTOMY    . COLON RESECTION     age 68  . COLONOSCOPY    . GANGLION CYST EXCISION    . TONSILLECTOMY      FAMILY HISTORY: Family History  Problem Relation Age of Onset  . Thyroid disease Brother     SOCIAL HISTORY: Social History   Socioeconomic History  . Marital status: Married    Spouse name: Courtney Beard  . Number of children: 2  . Years of education: Not on file  . Highest education level: Associate degree: occupational, Scientist, product/process developmenttechnical, or vocational program  Occupational History    Comment: retired  Engineer, productionocial Needs  . Financial resource strain:  Not on file  . Food insecurity:    Worry: Not on file    Inability: Not on file  . Transportation needs:    Medical: Not on file    Non-medical: Not on file  Tobacco Use  . Smoking status: Former Smoker    Years: 25.00  . Smokeless tobacco: Never Used  . Tobacco comment: quit smoking in 2002  Substance and Sexual Activity  . Alcohol use: Yes    Comment: " occasional ", quit 01/2018  . Drug use: Never  . Sexual activity: Not on file  Lifestyle  . Physical activity:    Days per week: Not on file    Minutes per session: Not on file  . Stress: Not on file  Relationships  . Social connections:    Talks on phone: Not on file    Gets together: Not on file    Attends religious service: Not on file    Active member of club or organization: Not  on file    Attends meetings of clubs or organizations: Not on file    Relationship status: Not on file  . Intimate partner violence:    Fear of current or ex partner: Not on file    Emotionally abused: Not on file    Physically abused: Not on file    Forced sexual activity: Not on file  Other Topics Concern  . Not on file  Social History Narrative   Courtney Beard resides with spouse. She had two sons. Before admission she was driving and independent.    Caffeine- 2 servings daily     PHYSICAL EXAM   VIDEO EXAM  GENERAL EXAM/CONSTITUTIONAL:  Vitals: There were no vitals filed for this visit.  There is no height or weight on file to calculate BMI. Wt Readings from Last 3 Encounters:  08/18/18 101 lb (45.8 kg)  07/07/18 97 lb (44 kg)  06/24/18 99 lb 13.9 oz (45.3 kg)     Patient is in no distress; well developed, nourished and groomed; neck is supple   NEUROLOGIC: MENTAL STATUS:  No flowsheet data found.  awake, alert, oriented to person  recent memory intact  normal attention and concentration  language fluent, comprehension intact, naming intact  fund of knowledge appropriate  CRANIAL NERVE:   2nd, 3rd, 4th, 6th - visual fields full to confrontation, extraocular muscles intact, no nystagmus  5th - facial sensation symmetric  7th - facial strength symmetric  8th - hearing intact  11th - shoulder shrug symmetric  12th - tongue protrusion midline  MOTOR:   NO TREMOR; NO DRIFT IN BUE  SENSORY:   normal and symmetric to light touch  COORDINATION:   fine finger movements normal     DIAGNOSTIC DATA (LABS, IMAGING, TESTING) - I reviewed patient records, labs, notes, testing and imaging myself where available.  Lab Results  Component Value Date   WBC 7.2 06/22/2018   HGB 8.4 (L) 06/22/2018   HCT 26.7 (L) 06/22/2018   MCV 77.8 (L) 06/22/2018   PLT 439 (H) 06/22/2018      Component Value Date/Time   NA 136 06/22/2018 0547   K 3.7  06/22/2018 0547   CL 105 06/22/2018 0547   CO2 25 06/22/2018 0547   GLUCOSE 94 06/22/2018 0547   BUN 15 06/22/2018 0547   CREATININE 0.77 06/22/2018 0547   CALCIUM 9.1 06/22/2018 0547   PROT 6.0 (L) 06/22/2018 0547   ALBUMIN 2.7 (L) 06/22/2018 0547   AST 19 06/22/2018 0547  ALT 32 06/22/2018 0547   ALKPHOS 78 06/22/2018 0547   BILITOT 0.4 06/22/2018 0547   GFRNONAA >60 06/22/2018 0547   GFRAA >60 06/22/2018 0547   Lab Results  Component Value Date   TRIG 220 (H) 06/08/2018   No results found for: HGBA1C Lab Results  Component Value Date   VITAMINB12 1,509 (H) 06/14/2018   No results found for: TSH   05/23/2018 CT head unremarkable [I reviewed images myself and agree with interpretation. -VRP]     ASSESSMENT AND PLAN  68 y.o. year old female here with history of alcohol abuse, depression, cardiac arrest, metabolic encephalopathy, with ongoing subjective cognitive deficits.  Most likely is a combination of the above factors.  Recommend to optimize nutrition, exercise, cognitively stimulating activities.  No evidence of dementia at this time as patient is able to maintain all of her ADLs  Dx:  1. Memory loss     Virtual Visit via Video Note  I connected with Courtney Beard on 12/30/18 at  9:30 AM EDT by a video enabled telemedicine application and verified that I am speaking with the correct person using two identifiers.  Location: Patient: home  Provider: office   I discussed the limitations of evaluation and management by telemedicine and the availability of in person appointments. The patient expressed understanding and agreed to proceed.   I discussed the assessment and treatment plan with the patient. The patient was provided an opportunity to ask questions and all were answered. The patient agreed with the plan and demonstrated an understanding of the instructions.   The patient was advised to call back or seek an in-person evaluation if the symptoms  worsen or if the condition fails to improve as anticipated.  I provided 35 minutes of non-face-to-face time during this encounter.   PLAN:  MEMORY LOSS  - Recommend to optimize nutrition, exercise, cognitively stimulating activities.  - Consider evaluation and treatment by psychiatry, psychology for depression and anxiety  Return for return to PCP.    Suanne Marker, MD 12/30/2018, 9:41 AM Certified in Neurology, Neurophysiology and Neuroimaging  [facility] [address] [place], [place] [address] [phone]

## 2019-01-08 ENCOUNTER — Other Ambulatory Visit: Payer: Self-pay | Admitting: Family Medicine

## 2019-01-08 DIAGNOSIS — Z1231 Encounter for screening mammogram for malignant neoplasm of breast: Secondary | ICD-10-CM

## 2019-01-19 DIAGNOSIS — R69 Illness, unspecified: Secondary | ICD-10-CM | POA: Diagnosis not present

## 2019-01-19 DIAGNOSIS — D508 Other iron deficiency anemias: Secondary | ICD-10-CM | POA: Diagnosis not present

## 2019-01-19 DIAGNOSIS — R7309 Other abnormal glucose: Secondary | ICD-10-CM | POA: Diagnosis not present

## 2019-01-25 ENCOUNTER — Encounter: Payer: Self-pay | Admitting: Physician Assistant

## 2019-01-25 ENCOUNTER — Other Ambulatory Visit: Payer: Self-pay

## 2019-01-25 ENCOUNTER — Ambulatory Visit: Payer: Medicare HMO | Admitting: Physician Assistant

## 2019-01-25 VITALS — BP 135/84 | HR 71 | Ht 62.0 in | Wt 102.0 lb

## 2019-01-25 DIAGNOSIS — Z87898 Personal history of other specified conditions: Secondary | ICD-10-CM

## 2019-01-25 DIAGNOSIS — R413 Other amnesia: Secondary | ICD-10-CM | POA: Diagnosis not present

## 2019-01-25 DIAGNOSIS — F331 Major depressive disorder, recurrent, moderate: Secondary | ICD-10-CM | POA: Diagnosis not present

## 2019-01-25 DIAGNOSIS — R69 Illness, unspecified: Secondary | ICD-10-CM | POA: Diagnosis not present
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
## 2019-02-16 ENCOUNTER — Other Ambulatory Visit: Payer: Self-pay | Admitting: Family Medicine

## 2019-02-16 DIAGNOSIS — N6320 Unspecified lump in the left breast, unspecified quadrant: Secondary | ICD-10-CM

## 2019-02-17 ENCOUNTER — Other Ambulatory Visit: Payer: Self-pay | Admitting: Family Medicine

## 2019-02-17 DIAGNOSIS — N63 Unspecified lump in unspecified breast: Secondary | ICD-10-CM

## 2019-02-19 DIAGNOSIS — D508 Other iron deficiency anemias: Secondary | ICD-10-CM | POA: Diagnosis not present

## 2019-02-19 DIAGNOSIS — R7309 Other abnormal glucose: Secondary | ICD-10-CM | POA: Diagnosis not present

## 2019-02-25 ENCOUNTER — Ambulatory Visit: Payer: Medicare HMO

## 2019-03-08 ENCOUNTER — Other Ambulatory Visit: Payer: Self-pay

## 2019-03-08 ENCOUNTER — Ambulatory Visit
Admission: RE | Admit: 2019-03-08 | Discharge: 2019-03-08 | Disposition: A | Discharge status: Home / Self Care | Payer: Medicare HMO | Source: Ambulatory Visit | Attending: Family Medicine | Admitting: Family Medicine

## 2019-03-08 DIAGNOSIS — N63 Unspecified lump in unspecified breast: Secondary | ICD-10-CM

## 2019-03-08 DIAGNOSIS — R922 Inconclusive mammogram: Secondary | ICD-10-CM | POA: Diagnosis not present

## 2019-03-08 DIAGNOSIS — N6320 Unspecified lump in the left breast, unspecified quadrant: Secondary | ICD-10-CM

## 2019-03-08 DIAGNOSIS — N6489 Other specified disorders of breast: Secondary | ICD-10-CM | POA: Diagnosis not present

## 2019-03-23 DIAGNOSIS — R7309 Other abnormal glucose: Secondary | ICD-10-CM | POA: Diagnosis not present

## 2019-04-05 DIAGNOSIS — E7800 Pure hypercholesterolemia, unspecified: Secondary | ICD-10-CM | POA: Diagnosis not present

## 2019-04-05 DIAGNOSIS — M542 Cervicalgia: Secondary | ICD-10-CM | POA: Diagnosis not present

## 2019-04-05 DIAGNOSIS — Z1389 Encounter for screening for other disorder: Secondary | ICD-10-CM | POA: Diagnosis not present

## 2019-04-05 DIAGNOSIS — R69 Illness, unspecified: Secondary | ICD-10-CM | POA: Diagnosis not present

## 2019-04-05 DIAGNOSIS — D508 Other iron deficiency anemias: Secondary | ICD-10-CM | POA: Diagnosis not present

## 2019-04-05 DIAGNOSIS — Z0000 Encounter for general adult medical examination without abnormal findings: Secondary | ICD-10-CM | POA: Diagnosis not present

## 2019-06-02 DIAGNOSIS — R69 Illness, unspecified: Secondary | ICD-10-CM | POA: Diagnosis not present
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
## 2019-06-03 ENCOUNTER — Telehealth: Payer: Self-pay | Admitting: Family Medicine

## 2019-06-03 ENCOUNTER — Encounter: Payer: Self-pay | Admitting: Family Medicine

## 2019-06-03 ENCOUNTER — Ambulatory Visit: Payer: Medicare HMO | Admitting: Family Medicine

## 2019-06-03 ENCOUNTER — Other Ambulatory Visit: Payer: Self-pay

## 2019-06-03 VITALS — BP 112/66 | HR 83 | Temp 97.7°F | Ht 62.0 in | Wt 99.8 lb

## 2019-06-03 DIAGNOSIS — F332 Major depressive disorder, recurrent severe without psychotic features: Secondary | ICD-10-CM

## 2019-06-03 DIAGNOSIS — F1011 Alcohol abuse, in remission: Secondary | ICD-10-CM

## 2019-06-03 DIAGNOSIS — G9341 Metabolic encephalopathy: Secondary | ICD-10-CM | POA: Diagnosis not present

## 2019-06-03 DIAGNOSIS — R413 Other amnesia: Secondary | ICD-10-CM | POA: Diagnosis not present

## 2019-06-03 DIAGNOSIS — F419 Anxiety disorder, unspecified: Secondary | ICD-10-CM

## 2019-06-03 DIAGNOSIS — R69 Illness, unspecified: Secondary | ICD-10-CM | POA: Diagnosis not present

## 2019-06-03 NOTE — Patient Instructions (Signed)
  We will order an MR Brain to assess for any signs of dementia   Referral to formal cognitive testing   Continue close follow up with PCP   We will follow up pending MRI and cognitive testing   Memory Compensation Strategies  1. Use "WARM" strategy.  W= write it down  A= associate it  R= repeat it  M= make a mental note  2.   You can keep a Social worker.  Use a 3-ring notebook with sections for the following: calendar, important names and phone numbers,  medications, doctors' names/phone numbers, lists/reminders, and a section to journal what you did  each day.   3.    Use a calendar to write appointments down.  4.    Write yourself a schedule for the day.  This can be placed on the calendar or in a separate section of the Memory Notebook.  Keeping a  regular schedule can help memory.  5.    Use medication organizer with sections for each day or morning/evening pills.  You may need help loading it  6.    Keep a basket, or pegboard by the door.  Place items that you need to take out with you in the basket or on the pegboard.  You may also want to  include a message board for reminders.  7.    Use sticky notes.  Place sticky notes with reminders in a place where the task is performed.  For example: " turn off the  stove" placed by the stove, "lock the door" placed on the door at eye level, " take your medications" on  the bathroom mirror or by the place where you normally take your medications.  8.    Use alarms/timers.  Use while cooking to remind yourself to check on food or as a reminder to take your medicine, or as a  reminder to make a call, or as a reminder to perform another task, etc.

## 2019-06-03 NOTE — Telephone Encounter (Signed)
[employer] medicare order sent to [facility]. They will reach out to the patient to schedule and obtain the auth.  

## 2019-06-07 NOTE — Progress Notes (Signed)
I reviewed note and agree with plan.   Penni Bombard, MD 32/54/9826, 41:58 AM Certified in Neurology, Neurophysiology and Neuroimaging  [facility] [address] [place], [place] [address] [phone]

## 2019-06-14 ENCOUNTER — Other Ambulatory Visit: Payer: Self-pay

## 2019-06-14 DIAGNOSIS — Z20822 Contact with and (suspected) exposure to covid-19: Secondary | ICD-10-CM

## 2019-06-15 LAB — NOVEL CORONAVIRUS, NAA: SARS-CoV-2, NAA: DETECTED — AB

## 2019-06-24 DIAGNOSIS — J209 Acute bronchitis, unspecified: Secondary | ICD-10-CM | POA: Diagnosis not present

## 2019-06-28 ENCOUNTER — Other Ambulatory Visit: Payer: Self-pay | Admitting: Family Medicine

## 2019-06-30 ENCOUNTER — Other Ambulatory Visit: Payer: Self-pay

## 2019-06-30 ENCOUNTER — Ambulatory Visit
Admission: RE | Admit: 2019-06-30 | Discharge: 2019-06-30 | Disposition: A | Discharge status: Home / Self Care | Payer: Medicare HMO | Source: Ambulatory Visit | Attending: Family Medicine | Admitting: Family Medicine

## 2019-06-30 DIAGNOSIS — R413 Other amnesia: Secondary | ICD-10-CM | POA: Diagnosis not present

## 2019-06-30 DIAGNOSIS — F1011 Alcohol abuse, in remission: Secondary | ICD-10-CM

## 2019-06-30 MED ORDER — GADOBENATE DIMEGLUMINE 529 MG/ML IV SOLN
9.0000 mL | Freq: Once | INTRAVENOUS | Status: AC | PRN
  Administered 2019-06-30: 9 mL via INTRAVENOUS

## 2019-07-06 ENCOUNTER — Telehealth: Payer: Self-pay | Admitting: Neurology

## 2019-07-06 NOTE — Telephone Encounter (Signed)
Called the patient and reviewed her MRI results with her. Informed her that there was nothing on MRI to suggest Dementia. Advised there was small scattered areas that were present and these are common findings in patients who suffer with migraines,headaches, high blood pressure and so on. Pt verbalized understanding. Advised that if memory concerns persist Amy would recommend the patient consider a formal neurocognitive test through neuropsychologist. Patient did not state that she would move forward at this time with that recommendation. Pt verbalized understanding. Pt had no questions at this time but was encouraged to call back if questions arise.

## 2019-07-06 NOTE — Telephone Encounter (Signed)
-----   Message from Darleen Crocker, RN sent at 07/06/2019  8:40 AM EST -----  ----- Message ----- From: Debbora Presto, NP Sent: 07/06/2019   8:16 AM EST To: Brandon Melnick, RN  Please let her know that her MR Brain looks good! There were no findings to suggest dementia. There were a few "punctate scattered foci of nonspecific gliosis." This is a common finding and can represent many things like previous headaches/migraines, head trauma, elevated blood pressures or cholesterol in the past. I feel she should consider seeing neuropsychology for formal cognitive testing if memory loss concerns persist.

## 2019-10-11 ENCOUNTER — Ambulatory Visit: Payer: Medicare HMO | Attending: Internal Medicine

## 2019-10-11 DIAGNOSIS — Z23 Encounter for immunization: Secondary | ICD-10-CM | POA: Insufficient documentation

## 2019-10-11 NOTE — Progress Notes (Signed)
   Covid-19 Vaccination Clinic  Name:  Courtney Beard    MRN: [id number] DOB: [birth year]  10/11/2019  [name] was observed post Covid-19 immunization for 15 minutes without incidence. She was provided with Vaccine Information Sheet and instruction to access the V-Safe system.   [name] was instructed to call 911 with any severe reactions post vaccine: Marland Kitchen Difficulty breathing  . Swelling of your face and throat  . A fast heartbeat  . A bad rash all over your body  . Dizziness and weakness    Immunizations Administered    Name Date Dose VIS Date Route   Pfizer COVID-19 Vaccine 10/11/2019 12:54 PM 0.3 mL 07/23/2019 Intramuscular   Manufacturer: [employer], [employer]   Lot: [id number]   NDC: [address]-1

## 2019-10-25 ENCOUNTER — Other Ambulatory Visit: Payer: Self-pay

## 2019-10-25 ENCOUNTER — Ambulatory Visit (INDEPENDENT_AMBULATORY_CARE_PROVIDER_SITE_OTHER): Payer: Medicare HMO | Admitting: Psychology

## 2019-10-25 ENCOUNTER — Encounter: Payer: Self-pay | Admitting: Psychology

## 2019-10-25 ENCOUNTER — Ambulatory Visit: Payer: Medicare HMO | Admitting: Psychology

## 2019-10-25 DIAGNOSIS — G9341 Metabolic encephalopathy: Secondary | ICD-10-CM

## 2019-10-25 DIAGNOSIS — G3184 Mild cognitive impairment, so stated: Secondary | ICD-10-CM | POA: Diagnosis not present

## 2019-10-25 DIAGNOSIS — F411 Generalized anxiety disorder: Secondary | ICD-10-CM

## 2019-10-25 DIAGNOSIS — R4189 Other symptoms and signs involving cognitive functions and awareness: Secondary | ICD-10-CM

## 2019-10-25 DIAGNOSIS — F1011 Alcohol abuse, in remission: Secondary | ICD-10-CM

## 2019-10-25 DIAGNOSIS — F332 Major depressive disorder, recurrent severe without psychotic features: Secondary | ICD-10-CM

## 2019-10-25 DIAGNOSIS — R69 Illness, unspecified: Secondary | ICD-10-CM | POA: Diagnosis not present

## 2019-10-25 HISTORY — DX: Generalized anxiety disorder: F41.1

## 2019-10-25 HISTORY — DX: Mild cognitive impairment of uncertain or unknown etiology: G31.84

## 2019-10-25 NOTE — Progress Notes (Signed)
   Psychometrician Note   Cognitive testing was administered to Courtney Beard by Wallace Keller, B.S. (psychometrist) under the supervision of Dr. Newman Nickels, Ph.D., licensed psychologist. Courtney Beard did not appear overtly distressed by the testing session per behavioral observation or responses across self-report questionnaires. Dr. Newman Nickels, Ph.D. checked in with Courtney Beard as needed to manage any distress related to testing procedures (if applicable). Rest breaks were offered.    The battery of tests administered was selected by Dr. Newman Nickels, Ph.D. with consideration to Courtney Beard current level of functioning, the nature of her symptoms, emotional and behavioral responses during interview, level of literacy, observed level of motivation/effort, and the nature of the referral question. This battery was communicated to the psychometrist. Communication between Dr. Newman Nickels, Ph.D. and the psychometrist was ongoing throughout the evaluation and Dr. Newman Nickels, Ph.D. was immediately accessible at all times. Dr. Newman Nickels, Ph.D. provided supervision to the psychometrist on the date of this service to the extent necessary to assure the quality of all services provided.    Courtney Beard will return within approximately 1-2 weeks for an interactive feedback session with Dr. Milbert Coulter at which time her test performances, clinical impressions, and treatment recommendations will be reviewed in detail. Courtney Beard understands she can contact our office should she require our assistance before this time.  A total of 150 minutes of billable time were spent face-to-face with Courtney Beard by the psychometrist. This includes both test administration and scoring time. Billing for these services is reflected in the clinical report generated by Dr. Newman Nickels, Ph.D..  This note reflects time spent with the psychometrician and does not include test scores or any  clinical interpretations made by Dr. Milbert Coulter. The full report will follow in a separate note.

## 2019-10-25 NOTE — Progress Notes (Addendum)
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 Interference Test: Raw Score (Scaled Score) Percentile     Color Naming 32 secs. (10) 50 Average    Word Reading 21 secs. (12) 75 Above Average    Inhibition 95 secs. (5) 5 Well Below Average      Total Errors 7 errors (4) 2 Well Below Average    Inhibition/Switching 74 secs. (10) 50 Average      Total Errors 10 errors (3) 1 Exceptionally Low       D-KEFS Verbal Fluency Test: Raw Score (Scaled Score) Percentile     Letter Total Correct 43 (12) 75 Above Average    Category Total Correct 27 (7) 16 Below Average    Category Switching Total Correct 13 (11) 63 Average    Category Switching Accuracy 12 (11) 63 Average      Total Set Loss Errors 1 (11) 63 Average      Total Repetition Errors 6 (7) 16 Below Average       D-KEFS 20 Questions Test: Scaled Score Percentile     Total Weighted Achievement Score 14 91 Above Average    Initial Abstraction Score 13 84 Above Average       [employer] Card Sorting Test [facility]): Raw Score Percentile     Categories (trials) 2 (64) 11-16 Below Average    Total Errors 18 34 Average    Perseverative Errors 7 54 Average    Non-Perseverative Errors 11 14 Below Average    Failure to Maintain Set 1 --- ---       Language:          Verbal Fluency Test: Raw  Score (T Score) Percentile     Phonemic Fluency (FAS) 43 (52) 58 Average    Animal Fluency 12 (33) 5 Well Below Average        NAB Language Module, Form 1: T Score Percentile     Auditory Comprehension 55 69 Average    Naming 31/31 (57) 75 Above Average       Visuospatial/Visuoconstruction:      Raw Score Percentile   Clock Drawing: 8/10 --- Within Normal Limits       NAB Spatial Module, Form 1: T Score Percentile     Visual Discrimination 53 62 Average    Figure Drawing Copy 52 58 Average        Scaled Score Percentile   WAIS-IV Block Design: 9 37 Average  WAIS-IV  Matrix Reasoning: 7 16 Below Average       Mood and Personality:      Raw Score Percentile   Geriatric Depression Scale: 14 --- Mild  Geriatric Anxiety Scale: 20 --- Mild    Somatic 6 --- Mild    Cognitive 6 --- Mild    Affective 8 --- Moderate       Additional Questionnaires:      Raw Score Percentile   PROMIS Sleep Disturbance Questionnaire: 17 --- None to Slight   Informed Consent and Coding/Compliance:   Courtney Beard. [place] was provided with a verbal description of the nature and purpose of the present neuropsychological evaluation. Also reviewed were the foreseeable risks and/or discomforts and benefits of the procedure, limits of confidentiality, and mandatory reporting requirements of this provider. The patient was given the opportunity to ask questions and receive answers about the evaluation. Oral consent to participate was provided by the patient.   This evaluation was conducted by Newman Nickels, Ph.D., licensed clinical neuropsychologist. Courtney Beard. Chern completed a comprehensive clinical interview with Dr. Milbert Coulter, billed as one unit [phone], and 150 minutes of cognitive testing and scoring, billed as one unit [phone] and four additional units 96139. Psychometrist Wallace Keller, B.S., assisted Dr. Milbert Coulter with test administration and scoring procedures. As a separate and discrete service, Dr. Milbert Coulter spent a total of 180  minutes in interpretation and report writing billed as one unit [phone] and two units 96133.

## 2019-11-03 ENCOUNTER — Other Ambulatory Visit: Payer: Self-pay

## 2019-11-03 ENCOUNTER — Ambulatory Visit (INDEPENDENT_AMBULATORY_CARE_PROVIDER_SITE_OTHER): Payer: Medicare HMO | Admitting: Psychology

## 2019-11-03 DIAGNOSIS — F411 Generalized anxiety disorder: Secondary | ICD-10-CM

## 2019-11-03 DIAGNOSIS — G3184 Mild cognitive impairment, so stated: Secondary | ICD-10-CM

## 2019-11-03 NOTE — Progress Notes (Signed)
   Neuropsychology Feedback Session Courtney Beard. [facility] [place] Department of Neurology  Reason for Referral:   Courtney Beard a 69 y.o. right-handed Caucasian female referred by Shawnie Dapper, NP,to characterize hercurrent cognitive functioning and assist with diagnostic clarity and treatment planning in the context of subjective cognitive decline and several psychiatric comorbidities.  Feedback:   [name] completed a comprehensive neuropsychological evaluation on 10/25/2019. Please refer to that encounter for the full report and recommendations. Briefly, results suggested primary weaknesses across certain aspects of executive functioning (namely response inhibition and pattern recognition) and semantic fluency. Performance across encoding (i.e., learning) and retrieval aspects of memory were variable, with lower performances across a list learning task. The etiology for her deficits is currently unclear. Despite some variability across memory measures, she was able to adequately learn information across story and shape learning tasks; retrieval of this information was largely commensurate with performance across learning trials for these two measures. Consolidation scores were generally appropriate, thus not currently suggestive of a memory storage deficit. This, coupled with strong confrontation naming scores, is not consistent with what would be expected in Alzheimer's disease. 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 She also has a reported family history of early-onset dementia symptoms (i.e., late 40s/early 54s), both of which have been associated with an increased risk for the eventual development of Alzheimer's disease. While that does not assure that Ms. Courtney Beard will eventually develop this condition, continued medical monitoring will be important moving forward.  Ms. Courtney Beard was accompanied by her husband during the  current telephone appointment. Content of the current session focused on the results of her neuropsychological evaluation. Ms. Courtney Beard and her husband were given the opportunity to ask questions and their questions were answered. They were encouraged to reach out should additional questions arise. The report has been shared with her within her MyChart.      24 minutes were spent conducting the current feedback session with Ms. Courtney Beard, billed as one unit [phone].

## 2019-11-09 ENCOUNTER — Ambulatory Visit: Payer: Medicare HMO | Attending: Internal Medicine

## 2019-11-09 ENCOUNTER — Encounter: Payer: Medicare HMO | Admitting: Psychology

## 2019-11-09 DIAGNOSIS — Z23 Encounter for immunization: Secondary | ICD-10-CM

## 2019-11-09 NOTE — Progress Notes (Signed)
   Covid-19 Vaccination Clinic  Name:  Courtney Beard    MRN: [id number] DOB: 24-Apr-1951  11/09/2019  [name] was observed post Covid-19 immunization for 15 minutes without incident. She was provided with Vaccine Information Sheet and instruction to access the V-Safe system.   [name] was instructed to call 911 with any severe reactions post vaccine: Marland Kitchen Difficulty breathing  . Swelling of face and throat  . A fast heartbeat  . A bad rash all over body  . Dizziness and weakness   Immunizations Administered    Name Date Dose VIS Date Route   Pfizer COVID-19 Vaccine 11/09/2019 12:08 PM 0.3 mL 07/23/2019 Intramuscular   Manufacturer: [employer], [employer]   Lot: [id number]   NDC: [address]-9

## 2019-12-13 DIAGNOSIS — R69 Illness, unspecified: Secondary | ICD-10-CM | POA: Diagnosis not present

## 2019-12-13 DIAGNOSIS — E559 Vitamin D deficiency, unspecified: Secondary | ICD-10-CM | POA: Diagnosis not present

## 2019-12-13 DIAGNOSIS — R413 Other amnesia: Secondary | ICD-10-CM | POA: Diagnosis not present

## 2020-01-11 DIAGNOSIS — E559 Vitamin D deficiency, unspecified: Secondary | ICD-10-CM | POA: Diagnosis not present

## 2020-01-31 ENCOUNTER — Encounter: Payer: Self-pay | Admitting: Physician Assistant

## 2020-01-31 ENCOUNTER — Other Ambulatory Visit: Payer: Self-pay

## 2020-01-31 ENCOUNTER — Ambulatory Visit (INDEPENDENT_AMBULATORY_CARE_PROVIDER_SITE_OTHER): Payer: Medicare HMO | Admitting: Physician Assistant

## 2020-01-31 DIAGNOSIS — R413 Other amnesia: Secondary | ICD-10-CM | POA: Diagnosis not present

## 2020-01-31 DIAGNOSIS — F411 Generalized anxiety disorder: Secondary | ICD-10-CM | POA: Diagnosis not present

## 2020-01-31 DIAGNOSIS — F1020 Alcohol dependence, uncomplicated: Secondary | ICD-10-CM

## 2020-01-31 DIAGNOSIS — R69 Illness, unspecified: Secondary | ICD-10-CM | POA: Diagnosis not present

## 2020-01-31 DIAGNOSIS — F332 Major depressive disorder, recurrent severe without psychotic features: Secondary | ICD-10-CM | POA: Diagnosis not present

## 2020-01-31 MED ORDER — LORAZEPAM 0.5 MG PO TABS: 0.5000 mg | ORAL_TABLET | Freq: Two times a day (BID) | ORAL | 1 refills | Status: DC | PRN

## 2020-01-31 MED ORDER — ESCITALOPRAM OXALATE 10 MG PO TABS: 10.0000 mg | ORAL_TABLET | Freq: Every day | ORAL | 1 refills | Status: DC

## 2020-01-31 NOTE — Progress Notes (Signed)
[facility]  Patient ID: Courtney Beard,  MRN: [id number]  PCP: Merri Brunette, MD  Date of Evaluation: 01/31/2020 Time spent:45 minutes  Chief Complaint:  Chief Complaint    Anxiety; Depression      HISTORY/CURRENT STATUS: HPI to discuss anxiety, depression, and help with alcohol.  Husband, [name] accompanies her.  [name] was seen here June 15 of 2020 and was stable at that time, so we agreed for her to follow up with her PCP.  She has been getting refills from her.  Since her symptoms worsened again several months ago, her PCP wanted her to be reevaluated here.  In April had a PA, and since then has been so anxious she has a hard time with usual life circumstances.  She does not like to be left alone and when her husband works 12-hour days, 3 days a week, she has a really hard time with it.  She has been so anxious at times that she has started drinking small amounts of alcohol again.  The last time was a few weeks ago.  She does not want to drink and does not understand why she goes back to that as it is not a good coping tool.  Has trouble sleeping sometimes because she is so anxious.  She has been having some memory issues and cognitive deficits and had neuropsychiatric testing by Dr. Rosann Auerbach in March.  The testing showed mild cognitive impairment with uncertain etiology.  Neuroimaging was normal.  See his note from 10/25/2019.  Does feel depressed.  Has a hard time enjoying things.  Energy and motivation are low.  Cries easily.  History of overdose on Klonopin several years ago.  Since that time, her husband dispenses all of her medication, and the Ativan is locked up.  Has been on Lexapro and Wellbutrin for a couple of years. She does have passive suicidal ideations but no plan.  Patient denies increased energy with decreased need for sleep, no increased talkativeness, no racing thoughts, no impulsivity or risky behaviors, no increased spending, no increased  libido, no grandiosity, no increased irritability or anger, and no hallucinations.  Review of Systems  Constitutional: Negative.   HENT: Negative.   Eyes: Negative.   Respiratory: Positive for shortness of breath.        When anxious, has SOB  Cardiovascular: Positive for palpitations.       When anxious, her heart will race, but goes back to normal within a few minutes  Gastrointestinal: Negative.   Genitourinary: Negative.   Musculoskeletal: Negative.   Skin: Negative.   Neurological: Negative.   Endo/Heme/Allergies: Negative.   Psychiatric/Behavioral: Positive for depression, memory loss and suicidal ideas. Negative for hallucinations and substance abuse. The patient is nervous/anxious and has insomnia.        See HPI   Individual Medical History/ Review of Systems: Changes? :No    Past medications for mental health diagnoses include: Lexapro, Wellbutrin SR, Klonopin (which she OD'ed on.) Melatonin, prozac, Cymbalta  Allergies: Erythromycin  Current Medications:  Current Outpatient Medications:  .  acetaminophen (TYLENOL) 325 MG tablet, Take 2 tablets (650 mg total) by mouth every 6 (six) hours as needed for fever., Disp: , Rfl:  .  b complex vitamins capsule, Take 1 capsule by mouth daily., Disp: , Rfl:  .  buPROPion (WELLBUTRIN XL) 150 MG 24 hr tablet, Take 150 mg by mouth daily., Disp: , Rfl:  .  Coenzyme Q10 (CO Q10) 100 MG CAPS, Take by mouth., Disp: ,  Rfl:  .  escitalopram (LEXAPRO) 20 MG tablet, Take 1 tablet (20 mg total) by mouth daily., Disp: 30 tablet, Rfl: 1 .  Grape Seed Extract 100 MG CAPS, Take 4 capsules by mouth., Disp: , Rfl:  .  LORazepam (ATIVAN) 0.5 MG tablet, Take 1 tablet (0.5 mg total) by mouth 2 (two) times daily as needed., Disp: 60 tablet, Rfl: 1 .  Omega-3 Fatty Acids (FISH OIL MAXIMUM STRENGTH) 1200 MG CPDR, Take 1,200 capsules by mouth 2 (two) times daily., Disp: , Rfl:  .  Cholecalciferol (VITAMIN D3) 125 MCG (5000 UT) CAPS, Take by mouth.  (Patient not taking: Reported on 01/31/2020), Disp: , Rfl:  .  escitalopram (LEXAPRO) 10 MG tablet, Take 1 tablet (10 mg total) by mouth daily. Take w/ 20 mg=30 mg, Disp: 30 tablet, Rfl: 1 .  Ferrous Sulfate (IRON PO), Take 65 mg by mouth daily. (Patient not taking: Reported on 01/31/2020), Disp: , Rfl:  .  Multiple Vitamins-Minerals (MULTI-DAY PLUS MINERALS PO), Take 1 tablet by mouth daily. (Patient not taking: Reported on 01/31/2020), Disp: , Rfl:  .  Specialty Vitamins Products (COLLAGEN ULTRA) CAPS, Take by mouth. (Patient not taking: Reported on 01/31/2020), Disp: , Rfl:  .  UNABLE TO FIND, Med Name: tumeric 500  Mg daily (Patient not taking: Reported on 01/31/2020), Disp: , Rfl:  Medication Side Effects: none  Family Medical/ Social History: Changes? No  MENTAL HEALTH EXAM:  There were no vitals taken for this visit.There is no height or weight on file to calculate BMI.  General Appearance: Casual, Neat and Well Groomed  Eye Contact:  Good  Speech:  Clear and Coherent and Normal Rate  Volume:  Normal  Mood:  Anxious and Depressed  Affect:  Depressed, Tearful and Anxious  Thought Process:  Goal Directed and Descriptions of Associations: Intact  Orientation:  Full (Time, Place, and Person)  Thought Content: Logical   Suicidal Thoughts:  No  Homicidal Thoughts:  No  Memory:  Immediate;   Fair Recent;   Fair  Judgement:  Fair  Insight:  Good  Psychomotor Activity:  Normal  Concentration:  Concentration: Fair and Attention Span: Fair  Recall:  Good  Fund of Knowledge: Good  Language: Good  Assets:  Desire for Improvement  ADL's:  Intact  Cognition: WNL  Prognosis:  Good    DIAGNOSES:    ICD-10-CM   1. Severe episode of recurrent major depressive disorder, without psychotic features (HCC)  F33.2   2. Generalized anxiety disorder  F41.1   3. Memory loss  R41.3   4. Alcohol use disorder, moderate, dependence (HCC)  F10.20     Receiving Psychotherapy: No    RECOMMENDATIONS:   PDMP was reviewed. I provided 45 minutes of face-to-face time during this encounter. Contract for safety in place.  Go to emergency room, call our office, We discussed alcohol and offered either acamprosate or naltrexone but she prefers not to use 1 of those drugs at this time.  She and [name] feel like she can get a handle on the alcohol if she could just feel better as far as the depression and anxiety go.  We agreed to only do 1 or 2 things at a time and go rather slowly with any medication changes.  I recommend increasing the Lexapro and changing the Wellbutrin to a long acting rather than sustained-release.  They are in agreement with that.  She also needs to be on multivitamin, omega-3, co-Q10, vitamin D, and especially B complex. Stop  alcohol use. Continue Ativan 0.5 mg, 1 p.o. twice daily as needed.  Her husband will dispense this and it is locked up when he is not at home and she has no access to it.  It will be okay for him to leave 1 pill out and available to her when he is working a 12-hour shift.  She should not need more than 1 during that time.  They verbalized understanding. Increase Lexapro to 30 mg, daily. Discontinue Wellbutrin SR. Start Wellbutrin XL 150 mg, 1 p.o. every morning. Refer for counseling. Return in 4 weeks.  Donnal Moat, PA-C

## 2020-02-10 IMAGING — CT CT CERVICAL SPINE W/O CM
5 of 8 series · 12 of 33 positions shown, 13 images · non-contrast
Comparison: None.

CLINICAL DATA: Pulseless arrest, status post CPR.

EXAM:
CT HEAD WITHOUT CONTRAST
CT CERVICAL SPINE WITHOUT CONTRAST
TECHNIQUE: Multidetector CT imaging of the head and cervical spine was
performed following the standard protocol without intravenous
contrast. Multiplanar CT image reconstructions of the cervical spine
were also generated.

[Series 5: head bone · axial · 0.39mm/px · z∈[-89,-35]mm · 2 of 82 slices shown]
[im 28/82  bone]
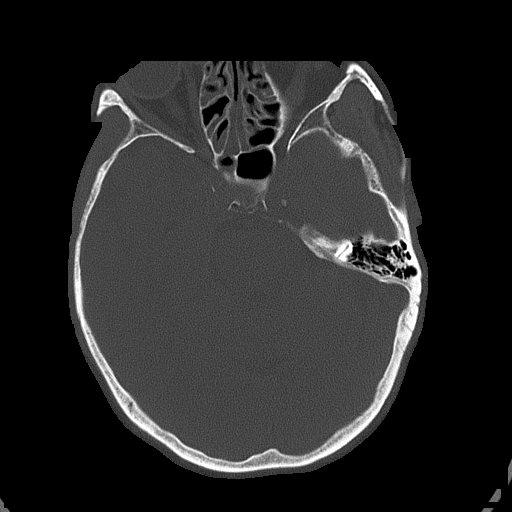
[im 55/82  bone]
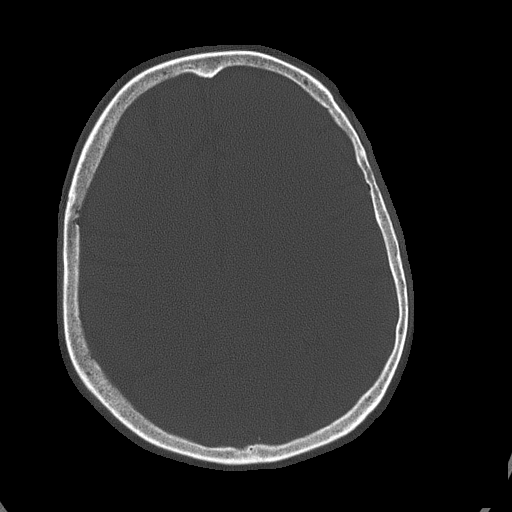

[Series 8: c_spine 2.0 st · axial · 0.33mm/px · z∈[-243,-141]mm · 3 of 103 slices shown, 4 images]
[im 26/103  soft-tissue]
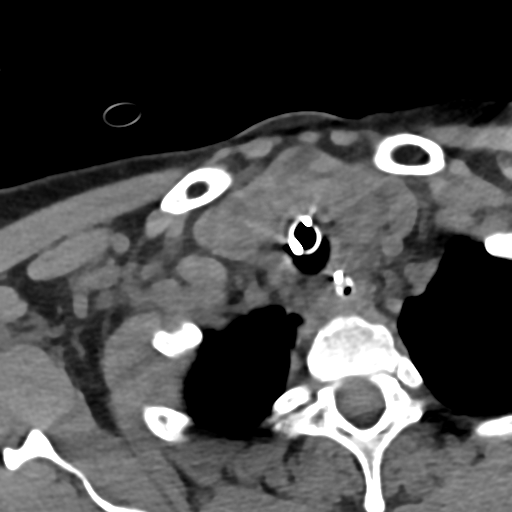
[im 26/103  bone]
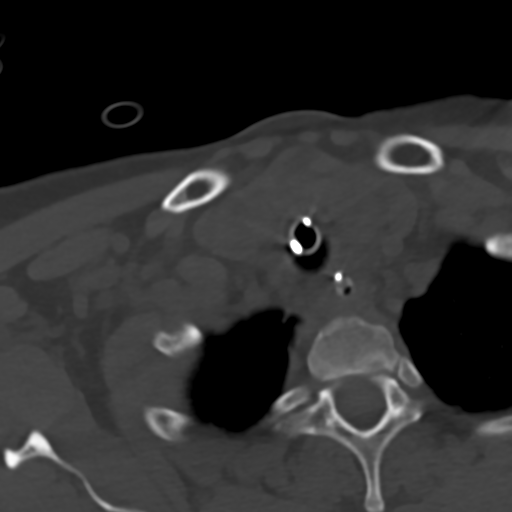
[im 52/103  bone]
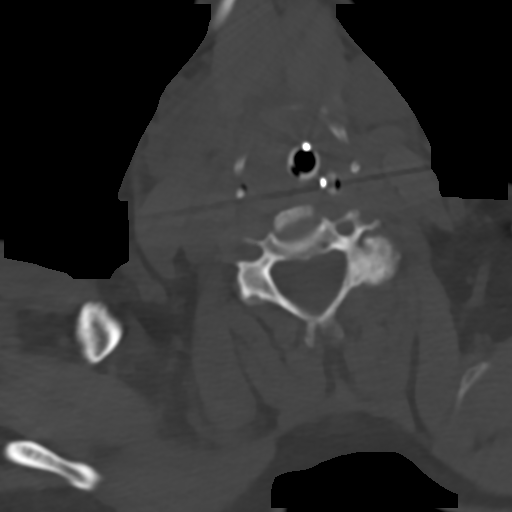
[im 77/103  bone]
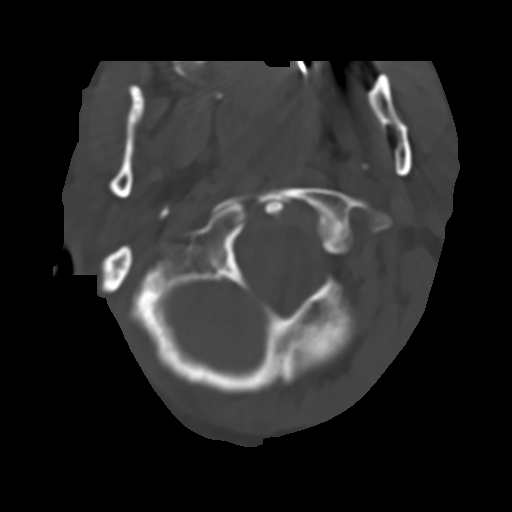

[Series 10: c_spine 2.0 sag bone · sagittal · 0.30mm/px · 4 of 61 slices shown]
[im 13/61  bone]
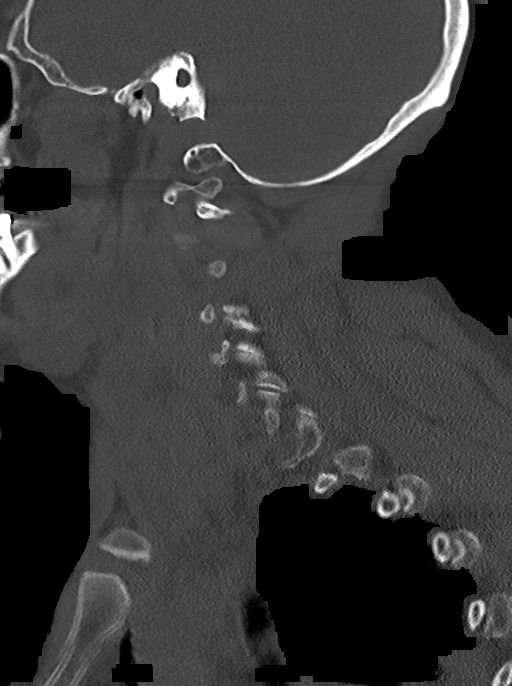
[im 25/61  bone]
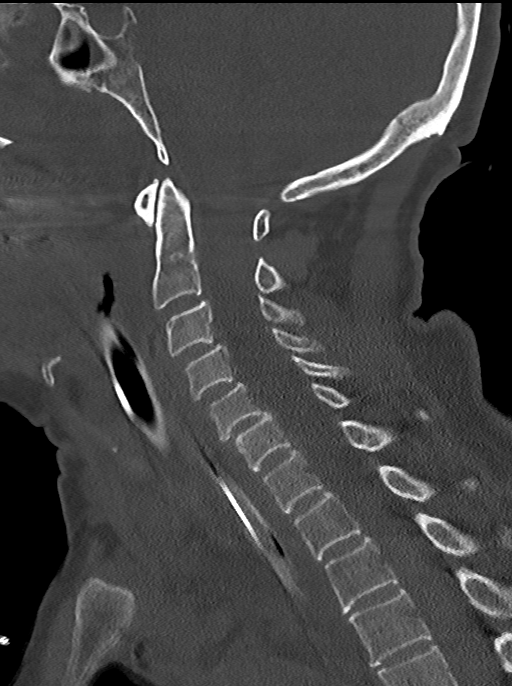
[im 37/61  bone]
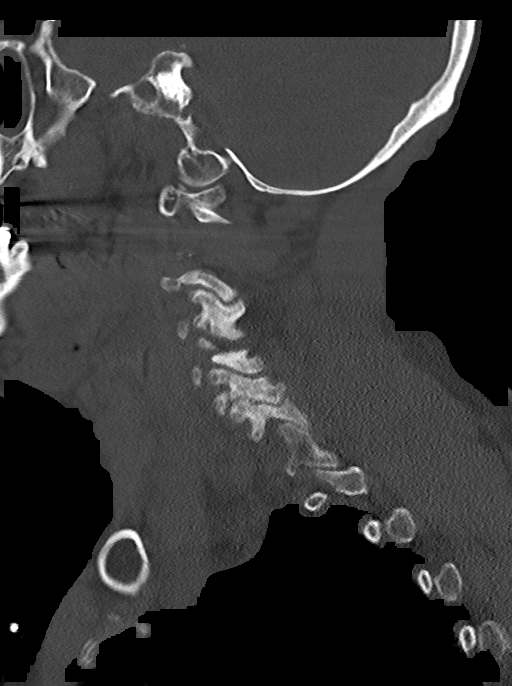
[im 49/61  bone]
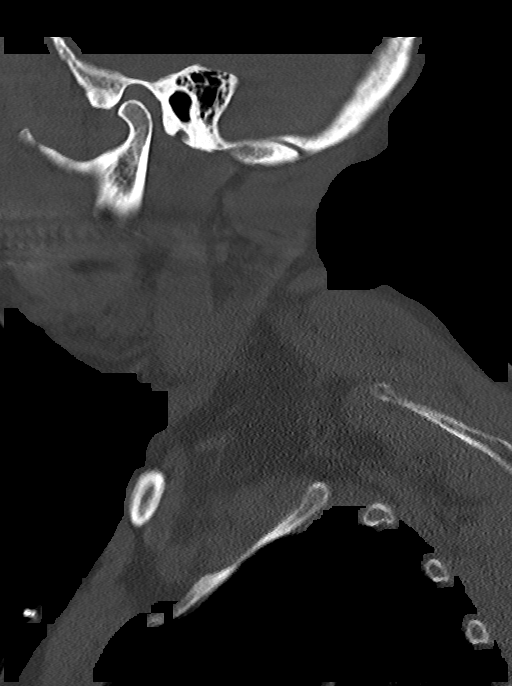

[Series 11: c_spine 2.0 cor bone · coronal · 0.30mm/px · 1 of 61 slices shown]
[im 31/61  bone]
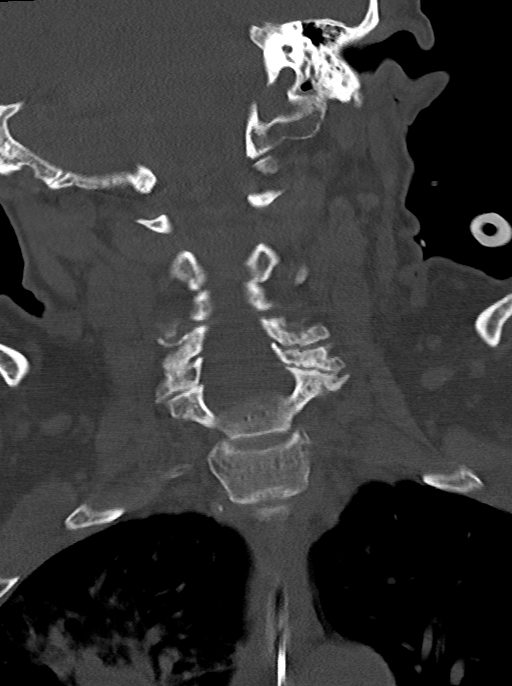

[Series 12: c_spine 2.0 orthogonals · oblique · 0.21mm/px · 2 of 101 slices shown]
[im 34/101  bone]
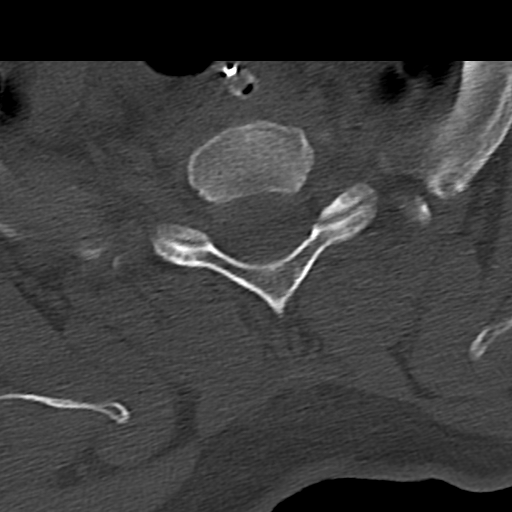
[im 67/101  bone]
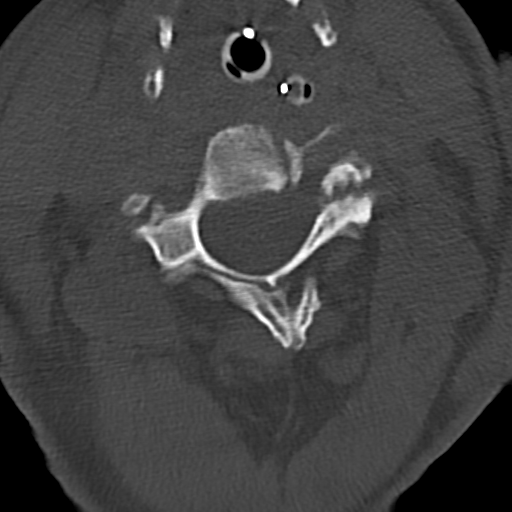

[12 of 33 positions shown; findings below may reference images not displayed]

FINDINGS: CT HEAD FINDINGS

Brain: There is no mass, hemorrhage or extra-axial collection. The
size and configuration of the ventricles and extra-axial CSF spaces
are normal. There is no acute or chronic infarction. The brain
parenchyma is normal.

Vascular: No abnormal hyperdensity of the major intracranial
arteries or dural venous sinuses. No intracranial atherosclerosis.

Skull: The visualized skull base, calvarium and extracranial soft
tissues are normal.

Sinuses/Orbits: Fluid layering in the maxillary and sphenoid
sinuses. The orbits are normal.

CT CERVICAL SPINE FINDINGS

Alignment: There is grade 1 anterolisthesis at C3-4, C4-5 and C5-6
secondary to facet hypertrophy. There is normal facet alignment.
Lateral masses of C1 and C2 and the occipital condyles are aligned.

Skull base and vertebrae: No acute fracture.

Soft tissues and spinal canal: No prevertebral fluid or swelling. No
visible canal hematoma.

Disc levels: No advanced spinal canal or neural foraminal stenosis.

Upper chest: Large area of consolidation in the right upper lobe.

Other: Patient is intubated with the endotracheal tube tip beyond
the field of view.
IMPRESSION: 1. No acute intracranial abnormality.
2. No acute fracture of the cervical spine.
3. Large area of right upper lobe consolidation, possibly secondary
to aspiration.

## 2020-02-10 IMAGING — DX DG ABD PORTABLE 1V
1 series · 1 of 1 positions shown · non-contrast
Comparison: One-view chest x-ray of the same day.

CLINICAL DATA: OG tube placement.

EXAM:
PORTABLE ABDOMEN - 1 VIEW

[abdomen kub]
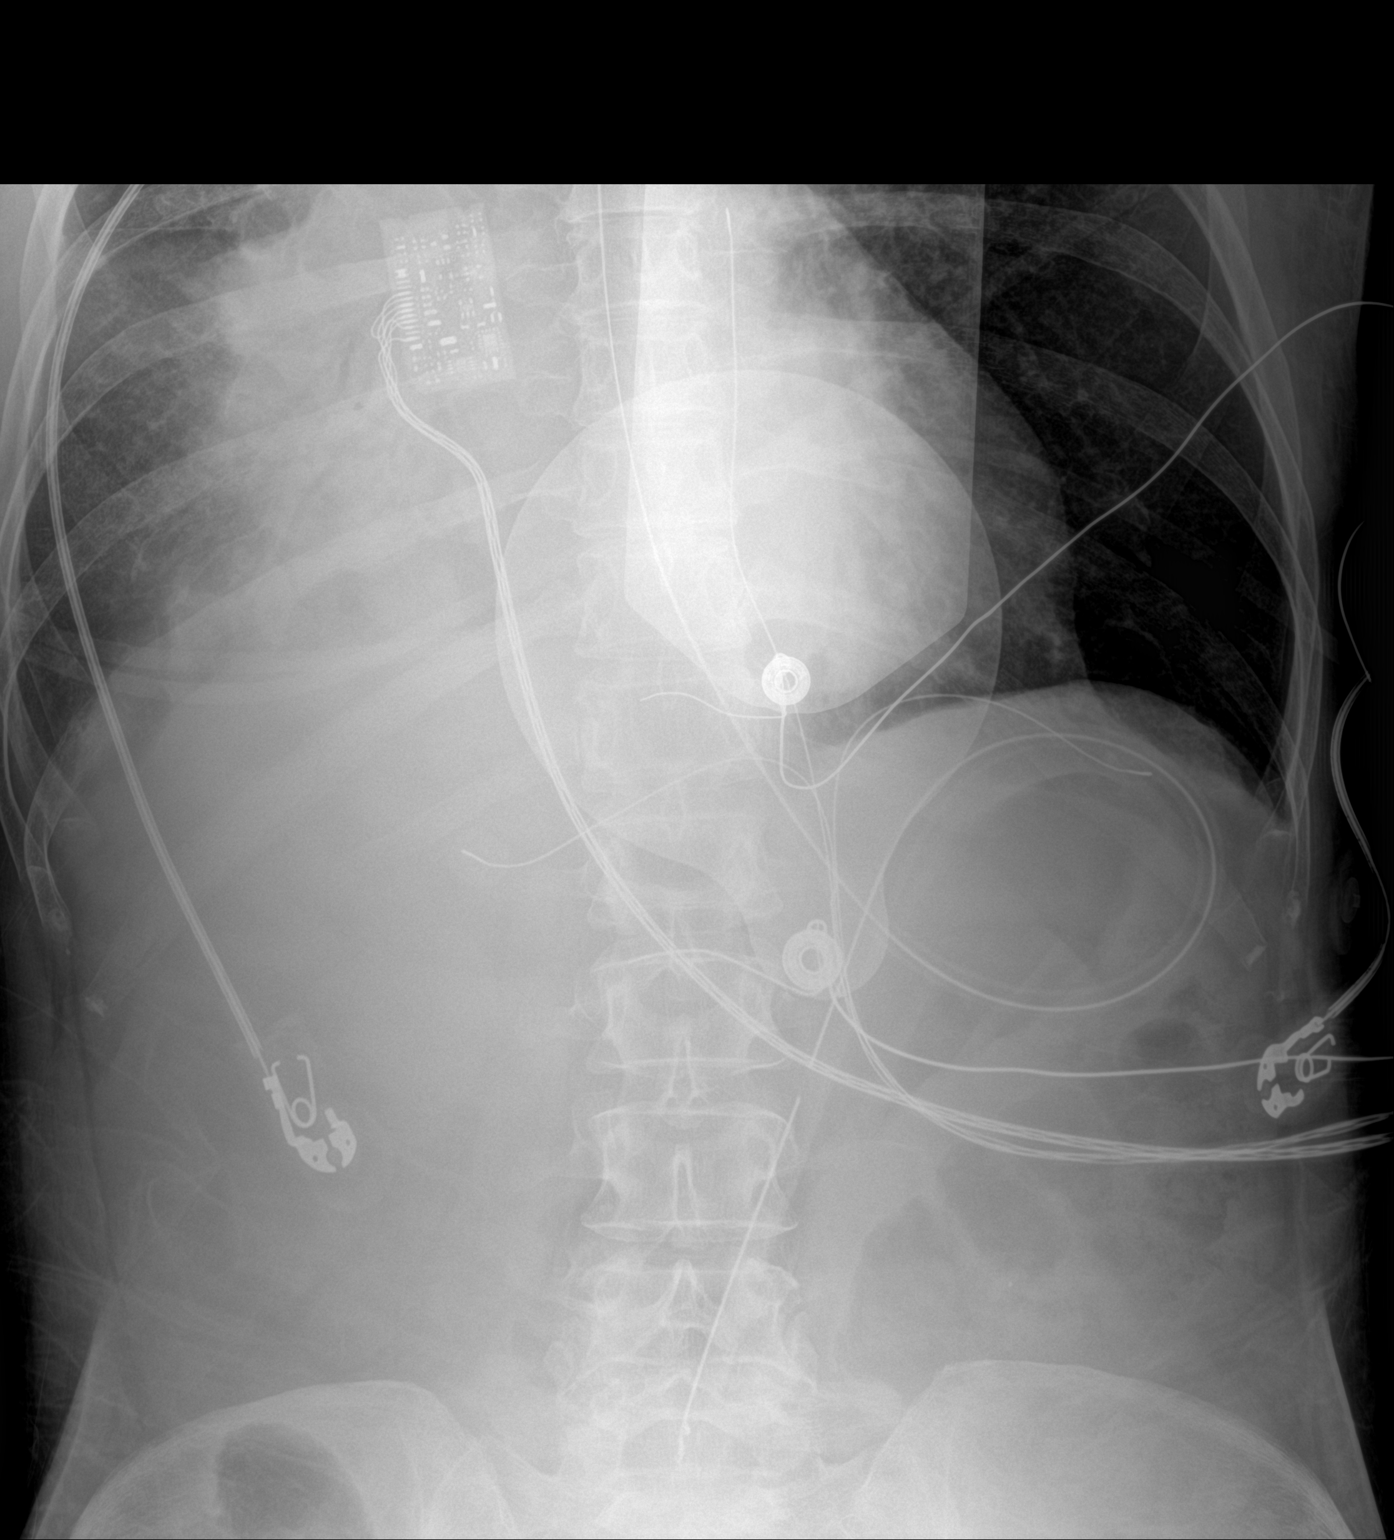

[1 of 1 positions shown; findings below may reference images not displayed]

FINDINGS: OG tube is now in place. The side port is in the mid stomach. Pacing
leads are stable. Right middle and lower lobe pneumonia are stable.
IMPRESSION: 1. Satisfactory positioning of OG tube in the stomach.
2. Right lower lobe pneumonia.

## 2020-02-28 ENCOUNTER — Encounter: Payer: Self-pay | Admitting: Physician Assistant

## 2020-02-28 ENCOUNTER — Ambulatory Visit (INDEPENDENT_AMBULATORY_CARE_PROVIDER_SITE_OTHER): Payer: Medicare HMO | Admitting: Physician Assistant

## 2020-02-28 ENCOUNTER — Other Ambulatory Visit: Payer: Self-pay

## 2020-02-28 DIAGNOSIS — Z87898 Personal history of other specified conditions: Secondary | ICD-10-CM

## 2020-02-28 DIAGNOSIS — R69 Illness, unspecified: Secondary | ICD-10-CM | POA: Diagnosis not present

## 2020-02-28 DIAGNOSIS — F331 Major depressive disorder, recurrent, moderate: Secondary | ICD-10-CM | POA: Diagnosis not present

## 2020-02-28 DIAGNOSIS — R413 Other amnesia: Secondary | ICD-10-CM | POA: Diagnosis not present

## 2020-02-28 DIAGNOSIS — F411 Generalized anxiety disorder: Secondary | ICD-10-CM

## 2020-02-28 MED ORDER — MEMANTINE HCL 5 MG PO TABS: ORAL_TABLET | ORAL | 1 refills | Status: DC

## 2020-02-28 NOTE — Progress Notes (Signed)
[facility]  Patient ID: Courtney Beard,  MRN: [id number]  PCP: Merri Brunette, MD  Date of Evaluation: 02/28/2020 Time spent:30 minutes  Chief Complaint:  Chief Complaint    Follow-up      HISTORY/CURRENT STATUS: HPI  For 1 month f/u. Accompanied by husband, [name].   At LOV, we increased the Lexapro.  She states it's 'hit or miss.' Some days she feels better and sometimes not.  She does not seem to be quite as anxious.  She is taking Ativan twice a day and it is helpful short-term.  Her chief complaint is her memory.  If we could figure out the cause and treat that, she would feel better about everything else.  She and [name] are going to visit one of her sons and his family in [place], leaving next week and will be gone for 2 weeks.  She is really looking forward to that but also nervous about it.  "I really want to be able to remember things."  Briefly, concerning the memory issues, she had neuropsychiatric testing by Dr. Vanessa Kick in March of this year.  That showed mild cognitive impairment with uncertain etiology.  See the results of testing on the chart.   She has not had any alcoholic beverages since a few weeks prior to her last visit in June.  States she is doing fine, not craving alcohol.  She is able to enjoy some things.  She still hates it when her husband has to work, which is every weekend.  She gets more anxious and sad when he is not there.  Energy and motivation are good on the days her husband is there but not so much the days he works.  No suicidal or homicidal thoughts.  Patient denies increased energy with decreased need for sleep, no increased talkativeness, no racing thoughts, no impulsivity or risky behaviors, no increased spending, no increased libido, no grandiosity, no increased irritability or anger, and no hallucinations.  Denies dizziness, syncope, seizures, numbness, tingling, tremor, tics, unsteady gait, slurred speech, confusion.  Denies muscle or joint pain, stiffness, or dystonia.  Individual Medical History/ Review of Systems: Changes? :No    Past medications for mental health diagnoses include: Lexapro, Wellbutrin SR, Klonopin (which she OD'ed on.) Melatonin, prozac, Cymbalta  Allergies: Erythromycin  Current Medications:  Current Outpatient Medications:  .  acetaminophen (TYLENOL) 325 MG tablet, Take 2 tablets (650 mg total) by mouth every 6 (six) hours as needed for fever., Disp: , Rfl:  .  b complex vitamins capsule, Take 1 capsule by mouth daily., Disp: , Rfl:  .  buPROPion (WELLBUTRIN XL) 150 MG 24 hr tablet, Take 150 mg by mouth daily., Disp: , Rfl:  .  Coenzyme Q10 (CO Q10) 100 MG CAPS, Take by mouth., Disp: , Rfl:  .  escitalopram (LEXAPRO) 10 MG tablet, Take 1 tablet (10 mg total) by mouth daily. Take w/ 20 mg=30 mg, Disp: 30 tablet, Rfl: 1 .  escitalopram (LEXAPRO) 20 MG tablet, Take 1 tablet (20 mg total) by mouth daily., Disp: 30 tablet, Rfl: 1 .  Grape Seed Extract 100 MG CAPS, Take 4 capsules by mouth., Disp: , Rfl:  .  LORazepam (ATIVAN) 0.5 MG tablet, Take 1 tablet (0.5 mg total) by mouth 2 (two) times daily as needed., Disp: 60 tablet, Rfl: 1 .  Omega-3 Fatty Acids (FISH OIL MAXIMUM STRENGTH) 1200 MG CPDR, Take 1,200 capsules by mouth 2 (two) times daily., Disp: , Rfl:  .  Cholecalciferol (VITAMIN  D3) 125 MCG (5000 UT) CAPS, Take by mouth. (Patient not taking: Reported on 01/31/2020), Disp: , Rfl:  .  Ferrous Sulfate (IRON PO), Take 65 mg by mouth daily. (Patient not taking: Reported on 01/31/2020), Disp: , Rfl:  .  memantine (NAMENDA) 5 MG tablet, 1 po qd for 1 week, then 1 po bid., Disp: 60 tablet, Rfl: 1 .  Multiple Vitamins-Minerals (MULTI-DAY PLUS MINERALS PO), Take 1 tablet by mouth daily. (Patient not taking: Reported on 01/31/2020), Disp: , Rfl:  .  Specialty Vitamins Products (COLLAGEN ULTRA) CAPS, Take by mouth. (Patient not taking: Reported on 01/31/2020), Disp: , Rfl:  .  UNABLE TO FIND,  Med Name: tumeric 500  Mg daily (Patient not taking: Reported on 01/31/2020), Disp: , Rfl:  Medication Side Effects: none  Family Medical/ Social History: Changes? No  MENTAL HEALTH EXAM:  There were no vitals taken for this visit.There is no height or weight on file to calculate BMI.  General Appearance: Casual, Neat and Well Groomed  Eye Contact:  Good  Speech:  Clear and Coherent and Normal Rate  Volume:  Normal  Mood:  Anxious  Affect:  Anxious  Thought Process:  Goal Directed and Descriptions of Associations: Intact  Orientation:  Full (Time, Place, and Person)  Thought Content: Logical   Suicidal Thoughts:  No  Homicidal Thoughts:  No  Memory:  Immediate;   Fair Recent;   Fair  Judgement:  Fair  Insight:  Good  Psychomotor Activity:  Normal  Concentration:  Concentration: Fair and Attention Span: Fair  Recall:  Good  Fund of Knowledge: Good  Language: Good  Assets:  Desire for Improvement  ADL's:  Intact  Cognition: WNL  Prognosis:  Good    DIAGNOSES:    ICD-10-CM   1. Generalized anxiety disorder  F41.1   2. Memory loss  R41.3   3. History of alcohol use disorder  Z87.898   4. Major depressive disorder, recurrent episode, moderate (HCC)  F33.1     Receiving Psychotherapy: No    RECOMMENDATIONS:  PDMP was reviewed. I provided 30 minutes of face-to-face time during this encounter. We discussed using Namenda or Aricept.  Benefits, risks, side effects were discussed and she and her husband accept.  She understands that although these drugs are for dementia, I am not concerned about that at this time, especially after reviewing her neuropsychiatric test results.  These drugs can help memory and cognitive deficits that are caused by many different things, and can also sometimes help depression and anxiety. Start Namenda 5 mg, 1 p.o. daily for 1 week and then 1 p.o. twice daily. Continue Lexapro 20 mg +10 mg daily. Continue Ativan 0.5 mg, 1 p.o. twice daily as  needed. Continue Wellbutrin XL 150 mg daily. Continue multivitamin, co-Q10, vitamin D, B complex, fish oil, recommend NAC, lavender for anxiety.   Recommend counseling. Return in 4 weeks.  Melony Overly, PA-C

## 2020-03-02 DIAGNOSIS — M542 Cervicalgia: Secondary | ICD-10-CM | POA: Diagnosis not present

## 2020-03-24 ENCOUNTER — Other Ambulatory Visit: Payer: Self-pay | Admitting: Physician Assistant

## 2020-03-26 NOTE — Telephone Encounter (Signed)
Apt Monday 08/16 

## 2020-03-27 ENCOUNTER — Other Ambulatory Visit: Payer: Self-pay

## 2020-03-27 ENCOUNTER — Encounter: Payer: Self-pay | Admitting: Physician Assistant

## 2020-03-27 ENCOUNTER — Ambulatory Visit (INDEPENDENT_AMBULATORY_CARE_PROVIDER_SITE_OTHER): Payer: Medicare HMO | Admitting: Physician Assistant

## 2020-03-27 DIAGNOSIS — F411 Generalized anxiety disorder: Secondary | ICD-10-CM

## 2020-03-27 DIAGNOSIS — R413 Other amnesia: Secondary | ICD-10-CM

## 2020-03-27 DIAGNOSIS — F331 Major depressive disorder, recurrent, moderate: Secondary | ICD-10-CM

## 2020-03-27 DIAGNOSIS — Z87898 Personal history of other specified conditions: Secondary | ICD-10-CM | POA: Diagnosis not present

## 2020-03-27 DIAGNOSIS — R69 Illness, unspecified: Secondary | ICD-10-CM | POA: Diagnosis not present

## 2020-03-27 MED ORDER — BUSPIRONE HCL 15 MG PO TABS: ORAL_TABLET | ORAL | 1 refills | Status: DC

## 2020-03-27 MED ORDER — LORAZEPAM 0.5 MG PO TABS: 0.5000 mg | ORAL_TABLET | Freq: Two times a day (BID) | ORAL | 1 refills | Status: DC | PRN

## 2020-03-27 NOTE — Progress Notes (Signed)
[facility]  Patient ID: Courtney Beard,  MRN: [id number]  PCP: Merri Brunette, MD  Date of Evaluation: 03/27/2020 Time spent:30 minutes  Chief Complaint:  Chief Complaint    Follow-up      HISTORY/CURRENT STATUS: HPI  For f/u after starting Namenda. Accompanied by husband, [name].   She and her husband went to [place] and [place] since the last visit, and had a good time seeing both their kids and their families. But it was stressful and 1 night she drank a whole bottle of wine, and then had a few beers at other times. She doesn't have cravings.  States that drinking that much is always an impulsive decision.  [name] agrees.  Still feels very anxious especially on weekends when her husband works.  It is more of a generalized sense of anxiety.  She reports that the more anxious she gets the more forgetful she gets.  The more forgetful, the more anxious and circles back over and over.  The Ativan does help but the effects do not last very long.  She is able to enjoy some things. Energy and motivation are good on the days her husband is there but not so much the days he works.  Has a hard time going to sleep and staying asleep if she does not take the Ativan at night.  No suicidal or homicidal thoughts.  Patient denies increased energy with decreased need for sleep, no increased talkativeness, no racing thoughts, no impulsivity or risky behaviors, no increased spending, no increased libido, no grandiosity, no increased irritability or anger, and no hallucinations.  Denies dizziness, syncope, seizures, numbness, tingling, tremor, tics, unsteady gait, slurred speech, confusion. Denies muscle or joint pain, stiffness, or dystonia.  Individual Medical History/ Review of Systems: Changes? :No    Past medications for mental health diagnoses include: Lexapro, Wellbutrin SR, Klonopin (which she OD'ed on.) Melatonin, prozac, Cymbalta, Namenda  Allergies: Erythromycin  Current  Medications:  Current Outpatient Medications:  .  acetaminophen (TYLENOL) 325 MG tablet, Take 2 tablets (650 mg total) by mouth every 6 (six) hours as needed for fever., Disp: , Rfl:  .  b complex vitamins capsule, Take 1 capsule by mouth daily., Disp: , Rfl:  .  buPROPion (WELLBUTRIN XL) 150 MG 24 hr tablet, Take 150 mg by mouth daily., Disp: , Rfl:  .  Coenzyme Q10 (CO Q10) 100 MG CAPS, Take by mouth., Disp: , Rfl:  .  escitalopram (LEXAPRO) 10 MG tablet, Take 1 tablet (10 mg total) by mouth daily. Take w/ 20 mg=30 mg, Disp: 30 tablet, Rfl: 1 .  escitalopram (LEXAPRO) 20 MG tablet, Take 1 tablet (20 mg total) by mouth daily., Disp: 30 tablet, Rfl: 1 .  Grape Seed Extract 100 MG CAPS, Take 4 capsules by mouth., Disp: , Rfl:  .  LORazepam (ATIVAN) 0.5 MG tablet, Take 1 tablet (0.5 mg total) by mouth 2 (two) times daily as needed. With occasional 1 mid-day prn., Disp: 75 tablet, Rfl: 1 .  Omega-3 Fatty Acids (FISH OIL MAXIMUM STRENGTH) 1200 MG CPDR, Take 1,200 capsules by mouth 2 (two) times daily., Disp: , Rfl:  .  busPIRone (BUSPAR) 15 MG tablet, 1/3 po bid for 1 week, then increase to 2/3 po bid for 1 week, then increase to 1 po bid., Disp: 60 tablet, Rfl: 1 .  Cholecalciferol (VITAMIN D3) 125 MCG (5000 UT) CAPS, Take by mouth. (Patient not taking: Reported on 01/31/2020), Disp: , Rfl:  .  Ferrous Sulfate (IRON PO),  Take 65 mg by mouth daily. (Patient not taking: Reported on 01/31/2020), Disp: , Rfl:  .  Multiple Vitamins-Minerals (MULTI-DAY PLUS MINERALS PO), Take 1 tablet by mouth daily. (Patient not taking: Reported on 01/31/2020), Disp: , Rfl:  .  Specialty Vitamins Products (COLLAGEN ULTRA) CAPS, Take by mouth. (Patient not taking: Reported on 01/31/2020), Disp: , Rfl:  .  UNABLE TO FIND, Med Name: tumeric 500  Mg daily (Patient not taking: Reported on 01/31/2020), Disp: , Rfl:  Medication Side Effects: none  Family Medical/ Social History: Changes? Son passed his boards and is now a  Marine scientist, and her other son is in [place] school  MENTAL HEALTH EXAM:  There were no vitals taken for this visit.There is no height or weight on file to calculate BMI.  General Appearance: Casual, Neat and Well Groomed  Eye Contact:  Good  Speech:  Clear and Coherent and Normal Rate  Volume:  Normal  Mood:  Anxious  Affect:  Anxious  Thought Process:  Goal Directed and Descriptions of Associations: Intact  Orientation:  Full (Time, Place, and Person)  Thought Content: Logical   Suicidal Thoughts:  No  Homicidal Thoughts:  No  Memory:  Immediate;   Fair Recent;   Fair  Judgement:  Fair  Insight:  Good  Psychomotor Activity:  Normal  Concentration:  Concentration: Fair and Attention Span: Fair  Recall:  Good  Fund of Knowledge: Good  Language: Good  Assets:  Desire for Improvement  ADL's:  Intact  Cognition: WNL  Prognosis:  Good    DIAGNOSES:    ICD-10-CM   1. Generalized anxiety disorder  F41.1   2. History of alcohol use disorder  Z87.898   3. Memory loss  R41.3   4. Major depressive disorder, recurrent episode, moderate (HCC)  F33.1     Receiving Psychotherapy: No    RECOMMENDATIONS:  PDMP was reviewed. I provided 30 minutes of face-to-face time during this encounter. Consider Naltrexone or Campral.  She is not having cravings of alcohol but more of an impulsive desire to drink, therefore I do not think either 1 of these drugs is indicated at this time. We discussed BuSpar, benefits, risks, side effects were discussed and she and her husband understand and agree. D/C Namenda. (she already has) Start BuSpar 15 mg 1/3 tablet twice daily for 1 week, then increase to 2/3 tablet twice daily for 1 week, then increase to 1 tablet twice daily for anxiety. Continue Lexapro 20 mg +10 mg daily. Continue Ativan 0.5 mg, 1 p.o. twice daily as needed.  We will increase the quantity by 15 pills a month.  She can take an extra 1/2-1 during the day as needed.  The plan is to decrease  it back down once the BuSpar becomes effective.  She and her husband understand that.  [name] was made aware of the fact that I will no longer prescribe Ativan if I find she is drinking a beer every once in a while.  She understands. Continue Wellbutrin XL 150 mg daily. Continue multivitamin, co-Q10, vitamin D, B complex, fish oil, recommend NAC, lavender for anxiety.   Recommend counseling. Return in 4 weeks  Melony Overly, [place]

## 2020-03-27 NOTE — Patient Instructions (Signed)
On the BuSpar 15 mg take one third of a pill twice a day for 1 week, then increase to two thirds of a pill twice a day for 1 week and then increase to 1 pill twice a day until we see each other.

## 2020-03-28 ENCOUNTER — Other Ambulatory Visit: Payer: Self-pay | Admitting: Physician Assistant

## 2020-04-14 DIAGNOSIS — E559 Vitamin D deficiency, unspecified: Secondary | ICD-10-CM | POA: Diagnosis not present

## 2020-05-08 ENCOUNTER — Other Ambulatory Visit: Payer: Self-pay

## 2020-05-08 ENCOUNTER — Encounter: Payer: Self-pay | Admitting: Physician Assistant

## 2020-05-08 ENCOUNTER — Ambulatory Visit (INDEPENDENT_AMBULATORY_CARE_PROVIDER_SITE_OTHER): Payer: Medicare HMO | Admitting: Physician Assistant

## 2020-05-08 DIAGNOSIS — F411 Generalized anxiety disorder: Secondary | ICD-10-CM

## 2020-05-08 DIAGNOSIS — Z87898 Personal history of other specified conditions: Secondary | ICD-10-CM | POA: Diagnosis not present

## 2020-05-08 DIAGNOSIS — F321 Major depressive disorder, single episode, moderate: Secondary | ICD-10-CM | POA: Diagnosis not present

## 2020-05-08 DIAGNOSIS — F331 Major depressive disorder, recurrent, moderate: Secondary | ICD-10-CM | POA: Diagnosis not present

## 2020-05-08 DIAGNOSIS — R413 Other amnesia: Secondary | ICD-10-CM

## 2020-05-08 DIAGNOSIS — R69 Illness, unspecified: Secondary | ICD-10-CM | POA: Diagnosis not present

## 2020-05-08 MED ORDER — DONEPEZIL HCL 5 MG PO TABS: 5.0000 mg | ORAL_TABLET | Freq: Every morning | ORAL | 1 refills | Status: DC

## 2020-05-08 NOTE — Progress Notes (Signed)
[facility]  Patient ID: Courtney Beard,  MRN: [id number]  PCP: Merri Brunette, MD  Date of Evaluation: 05/08/2020 Time spent:45 minutes  Chief Complaint:  Chief Complaint    Anxiety; Depression      HISTORY/CURRENT STATUS: HPI  For f/u after starting Buspar.  Accompanied by Courtney Beard her husband.  States that the anxiety is worse.  Feels like she's going crazy. Very sad a lot of the time. Feels that life for her husband would be better off without her.  Not suicidal, but worries about him, b/c he worries about her. Gets frustrated that her 2 sons don't call her, but then gets nervous that she won't be in a good place when she talks to them.  She tells people the same story several times.  States it is very disturbing because she cannot remember what she is told and what she has not.  One of the ladies that she works out with has showed concern, because her husband died from Alzheimer's several years ago.  [name] states that has her worried to death.  Her mom has dementia and she has had several other family members with dementia also.  Of importance is the fact that she saw Dr. Rosann Auerbach and had neuropsychological testing earlier this year.  Results did not point toward Alzheimer's but of course her symptoms needs to be monitored.  She and Courtney Beard both state that she has more anxiety on the days that he works.  She does need the Ativan usually 4 times a day on the days that he works.  Her husband dispenses all of her medications.  He will be retiring for good the beginning of November.  She states that we will be really good for her mental health.  She is not nearly as anxious or depressed when he is with her.  She is able to enjoy some things but does not like trying new things.  She does work out 5 days a week.  Energy and motivation are good as far as her physical health goes.  Appetite is normal.  She does not isolate.  No suicidal or homicidal thoughts.  Patient denies  increased energy with decreased need for sleep, no increased talkativeness, no racing thoughts, no impulsivity or risky behaviors, no increased spending, no increased libido, no grandiosity, no increased irritability or anger, and no hallucinations.  Denies dizziness, syncope, seizures, numbness, tingling, tremor, tics, unsteady gait, slurred speech, confusion. Denies muscle or joint pain, stiffness, or dystonia.  Individual Medical History/ Review of Systems: Changes? :No    Past medications for mental health diagnoses include: Lexapro, Wellbutrin SR, Klonopin (which she OD'ed on.) Melatonin, prozac, Cymbalta, Namenda  Allergies: Erythromycin  Current Medications:  Current Outpatient Medications:  .  acetaminophen (TYLENOL) 325 MG tablet, Take 2 tablets (650 mg total) by mouth every 6 (six) hours as needed for fever., Disp: , Rfl:  .  b complex vitamins capsule, Take 1 capsule by mouth daily., Disp: , Rfl:  .  buPROPion (WELLBUTRIN XL) 150 MG 24 hr tablet, Take 150 mg by mouth daily., Disp: , Rfl:  .  busPIRone (BUSPAR) 15 MG tablet, 1/3 po bid for 1 week, then increase to 2/3 po bid for 1 week, then increase to 1 po bid. (Patient taking differently: 5 mg 2 (two) times daily. ), Disp: 60 tablet, Rfl: 1 .  Cholecalciferol (VITAMIN D3) 125 MCG (5000 UT) CAPS, Take by mouth. , Disp: , Rfl:  .  Coenzyme Q10 (CO Q10)  100 MG CAPS, Take by mouth., Disp: , Rfl:  .  escitalopram (LEXAPRO) 10 MG tablet, TAKE ONE TABLET BY MOUTH DAILY **TAKE WITH 20MG  FOR A TOTAL OF 30MG **, Disp: 30 tablet, Rfl: 1 .  escitalopram (LEXAPRO) 20 MG tablet, Take 1 tablet (20 mg total) by mouth daily., Disp: 30 tablet, Rfl: 1 .  Grape Seed Extract 100 MG CAPS, Take 4 capsules by mouth., Disp: , Rfl:  .  LORazepam (ATIVAN) 0.5 MG tablet, Take 1 tablet (0.5 mg total) by mouth 2 (two) times daily as needed. With occasional 1 mid-day prn., Disp: 75 tablet, Rfl: 1 .  donepezil (ARICEPT) 5 MG tablet, Take 1 tablet (5 mg total) by  mouth in the morning., Disp: 30 tablet, Rfl: 1 .  Ferrous Sulfate (IRON PO), Take 65 mg by mouth daily. (Patient not taking: Reported on 01/31/2020), Disp: , Rfl:  .  Multiple Vitamins-Minerals (MULTI-DAY PLUS MINERALS PO), Take 1 tablet by mouth daily. (Patient not taking: Reported on 01/31/2020), Disp: , Rfl:  .  Omega-3 Fatty Acids (FISH OIL MAXIMUM STRENGTH) 1200 MG CPDR, Take 1,200 capsules by mouth 2 (two) times daily. (Patient not taking: Reported on 05/08/2020), Disp: , Rfl:  .  Specialty Vitamins Products (COLLAGEN ULTRA) CAPS, Take by mouth. (Patient not taking: Reported on 01/31/2020), Disp: , Rfl:  .  UNABLE TO FIND, Med Name: tumeric 500  Mg daily (Patient not taking: Reported on 01/31/2020), Disp: , Rfl:  Medication Side Effects: none  Family Medical/ Social History: Changes? No  MENTAL HEALTH EXAM:  There were no vitals taken for this visit.There is no height or weight on file to calculate BMI.  General Appearance: Casual, Neat and Well Groomed  Eye Contact:  Good  Speech:  Clear and Coherent and Normal Rate  Volume:  Normal  Mood:  Anxious and Depressed  Affect:  Depressed, Tearful and Anxious  Thought Process:  Goal Directed and Descriptions of Associations: Intact  Orientation:  Full (Time, Place, and Person)  Thought Content: Logical   Suicidal Thoughts:  No  Homicidal Thoughts:  No  Memory:  Immediate;   Fair Recent;   Fair  Judgement:  Fair  Insight:  Good  Psychomotor Activity:  Normal  Concentration:  Concentration: Fair and Attention Span: Fair  Recall:  Good  Fund of Knowledge: Good  Language: Good  Assets:  Desire for Improvement  ADL's:  Intact  Cognition: WNL  Prognosis:  Good    DIAGNOSES:    ICD-10-CM   1. Memory loss  R41.3   2. Generalized anxiety disorder  F41.1   3. Major depressive disorder, recurrent episode, moderate (HCC)  F33.1   4. History of alcohol use disorder  Z87.898     Receiving Psychotherapy: No    RECOMMENDATIONS:  PDMP  was reviewed. I provided 45 minutes of face-to-face time during this encounter. We discussed GeneSight testing, which I recommended today.  The specimen was obtained with patient and husband's consent.  They understand the benefits of this test.  Hopefully the results can help narrow the choices of medications with which to treat her. I also recommend Aricept.  Benefits, risk and side effects were discussed and she and her husband accept. We discussed the BuSpar.  Since she has not been able to tolerate a higher dose than 5 mg twice daily, she should stay on that for now. Start Aricept 5 mg, 1 p.o. every morning. Continue BuSpar 15 mg, one third p.o. twice daily. Continue Lexapro 20 mg +10 mg  daily. Continue Ativan 0.5 mg, 1 p.o. twice daily as needed.  Continue Wellbutrin XL 150 mg daily. Recommend counseling. Return in 4 weeks  Melony Overly, [place]

## 2020-05-09 DIAGNOSIS — M25562 Pain in left knee: Secondary | ICD-10-CM | POA: Diagnosis not present

## 2020-05-09 DIAGNOSIS — R69 Illness, unspecified: Secondary | ICD-10-CM | POA: Diagnosis not present

## 2020-05-09 HISTORY — DX: Pain in left knee: M25.562

## 2020-05-12 ENCOUNTER — Other Ambulatory Visit: Payer: Self-pay | Admitting: Family Medicine

## 2020-05-12 DIAGNOSIS — Z1231 Encounter for screening mammogram for malignant neoplasm of breast: Secondary | ICD-10-CM

## 2020-05-23 ENCOUNTER — Other Ambulatory Visit: Payer: Self-pay | Admitting: Physician Assistant

## 2020-05-23 NOTE — Telephone Encounter (Signed)
review 

## 2020-05-26 ENCOUNTER — Other Ambulatory Visit: Payer: Self-pay | Admitting: Physician Assistant

## 2020-05-26 NOTE — Telephone Encounter (Signed)
Apt 11/08 

## 2020-05-30 ENCOUNTER — Other Ambulatory Visit: Payer: Self-pay

## 2020-05-30 ENCOUNTER — Ambulatory Visit
Admission: RE | Admit: 2020-05-30 | Discharge: 2020-05-30 | Disposition: A | Discharge status: Home / Self Care | Payer: Medicare HMO | Source: Ambulatory Visit | Attending: Family Medicine | Admitting: Family Medicine

## 2020-05-30 DIAGNOSIS — Z1231 Encounter for screening mammogram for malignant neoplasm of breast: Secondary | ICD-10-CM

## 2020-06-19 ENCOUNTER — Ambulatory Visit (INDEPENDENT_AMBULATORY_CARE_PROVIDER_SITE_OTHER): Payer: Medicare HMO | Admitting: Physician Assistant

## 2020-06-19 ENCOUNTER — Other Ambulatory Visit: Payer: Self-pay

## 2020-06-19 ENCOUNTER — Encounter: Payer: Self-pay | Admitting: Physician Assistant

## 2020-06-19 DIAGNOSIS — Z87898 Personal history of other specified conditions: Secondary | ICD-10-CM | POA: Diagnosis not present

## 2020-06-19 DIAGNOSIS — F411 Generalized anxiety disorder: Secondary | ICD-10-CM

## 2020-06-19 DIAGNOSIS — R413 Other amnesia: Secondary | ICD-10-CM

## 2020-06-19 DIAGNOSIS — F331 Major depressive disorder, recurrent, moderate: Secondary | ICD-10-CM | POA: Diagnosis not present

## 2020-06-19 DIAGNOSIS — R69 Illness, unspecified: Secondary | ICD-10-CM | POA: Diagnosis not present

## 2020-06-19 DIAGNOSIS — Z1589 Genetic susceptibility to other disease: Secondary | ICD-10-CM | POA: Diagnosis not present

## 2020-06-19 DIAGNOSIS — Z9189 Other specified personal risk factors, not elsewhere classified: Secondary | ICD-10-CM | POA: Diagnosis not present

## 2020-06-19 DIAGNOSIS — F410 Panic disorder [episodic paroxysmal anxiety] without agoraphobia: Secondary | ICD-10-CM | POA: Diagnosis not present

## 2020-06-19 MED ORDER — FLUVOXAMINE MALEATE 100 MG PO TABS: 100.0000 mg | ORAL_TABLET | Freq: Every day | ORAL | 1 refills | Status: DC

## 2020-06-19 MED ORDER — LORAZEPAM 1 MG PO TABS: ORAL_TABLET | ORAL | 1 refills | Status: DC

## 2020-06-19 NOTE — Progress Notes (Signed)
[facility]  Patient ID: Courtney Beard,  MRN: [id number]  PCP: Merri Brunette, MD  Date of Evaluation: 06/19/2020 Time spent:40 minutes  Chief Complaint:  Chief Complaint    Anxiety; Depression      HISTORY/CURRENT STATUS: HPI  For follow-up to discuss GeneSight test results. Accompanied by [name] her husband.  At the last visit, we made several changes as well as obtaining a specimen for GeneSight test.  We started Aricept, she reports no change in memory, although it is too early to tell.  The BuSpar was making her feel "funny" so she stopped it a few days ago.  She is still extremely anxious.  The Ativan does not help like it previously did.  She needs to take 2 pills at a time for it to be effective.  It does work but because she takes 2, she does not have any more for the rest of the day.  States she gets panicky especially when her husband is not in the home with her.  This is unusual behavior for her and has only started in the past few years.  She has always been very independent, never being afraid to be alone but now she has a panic attack if she knows her husband will be gone.  He retired a few days ago and she is excited that he will be home with her.  When she does get anxious, states it feels like her heart is beating out of her chest, she feels shaky from the inside out, her palms will get sweaty and her chest gets tight.  If he is there with her, she does not feel this way very often, it is when they are separated.  She has noticed anxiety a couple of days a week when she is at the gym exercising.  She goes every day.  She is able to enjoy some things but does not like trying new things.  Energy and motivation are good as far as her physical health goes.  Appetite is normal.  She does not isolate but does not enjoy going new places, especially without her husband.  [name] is in charge of all of her medications.  He dispenses the Ativan as we have discussed.  No  suicidal or homicidal thoughts.  Patient denies increased energy with decreased need for sleep, no increased talkativeness, no racing thoughts, no impulsivity or risky behaviors, no increased spending, no increased libido, no grandiosity, no increased irritability or anger, is afraid that someone will come in the house to hurt her.  And no hallucinations.  Denies dizziness, syncope, seizures, numbness, tingling, tremor, tics, unsteady gait, slurred speech, confusion. Denies muscle or joint pain, stiffness, or dystonia.  Individual Medical History/ Review of Systems: Changes? :No    Past medications for mental health diagnoses include: Lexapro, Wellbutrin SR, Klonopin (which she OD'ed on.) Melatonin, prozac, Cymbalta, Namenda, BuSpar made her feel funny, Ativan, Aricept  Allergies: Erythromycin  Current Medications:  Current Outpatient Medications:  .  acetaminophen (TYLENOL) 325 MG tablet, Take 2 tablets (650 mg total) by mouth every 6 (six) hours as needed for fever., Disp: , Rfl:  .  b complex vitamins capsule, Take 1 capsule by mouth daily., Disp: , Rfl:  .  buPROPion (WELLBUTRIN XL) 150 MG 24 hr tablet, Take 150 mg by mouth daily., Disp: , Rfl:  .  Coenzyme Q10 (CO Q10) 100 MG CAPS, Take by mouth., Disp: , Rfl:  .  donepezil (ARICEPT) 5 MG tablet, Take 1  tablet (5 mg total) by mouth in the morning., Disp: 30 tablet, Rfl: 1 .  Grape Seed Extract 100 MG CAPS, Take 4 capsules by mouth., Disp: , Rfl:  .  Cholecalciferol (VITAMIN D3) 125 MCG (5000 UT) CAPS, Take by mouth.  (Patient not taking: Reported on 06/19/2020), Disp: , Rfl:  .  Ferrous Sulfate (IRON PO), Take 65 mg by mouth daily. (Patient not taking: Reported on 06/19/2020), Disp: , Rfl:  .  fluvoxaMINE ([name]) 100 MG tablet, Take 1 tablet (100 mg total) by mouth at bedtime., Disp: 30 tablet, Rfl: 1 .  LORazepam (ATIVAN) 1 MG tablet, 1 po bid prn, and may repeat 1 in mid-day occas prn., Disp: 75 tablet, Rfl: 1 .   Methylfol-Methylcob-Acetylcyst (CEREFOLIN NAC) 6-2-600 MG TABS, Take 1 tablet by mouth daily., Disp: 30 tablet, Rfl: 11 .  Multiple Vitamins-Minerals (MULTI-DAY PLUS MINERALS PO), Take 1 tablet by mouth daily. (Patient not taking: Reported on 01/31/2020), Disp: , Rfl:  .  Omega-3 Fatty Acids (FISH OIL MAXIMUM STRENGTH) 1200 MG CPDR, Take 1,200 capsules by mouth 2 (two) times daily. (Patient not taking: Reported on 05/08/2020), Disp: , Rfl:  .  Specialty Vitamins Products (COLLAGEN ULTRA) CAPS, Take by mouth. (Patient not taking: Reported on 01/31/2020), Disp: , Rfl:  .  UNABLE TO FIND, Med Name: tumeric 500  Mg daily (Patient not taking: Reported on 01/31/2020), Disp: , Rfl:  Medication Side Effects: none  Family Medical/ Social History: Changes? No  MENTAL HEALTH EXAM:  There were no vitals taken for this visit.There is no height or weight on file to calculate BMI.  General Appearance: Casual, Neat and Well Groomed  Eye Contact:  Good  Speech:  Clear and Coherent and Normal Rate  Volume:  Normal  Mood:  Anxious  Affect:  Anxious  Thought Process:  Goal Directed and Descriptions of Associations: Intact  Orientation:  Full (Time, Place, and Person)  Thought Content: Logical   Suicidal Thoughts:  No  Homicidal Thoughts:  No  Memory:  Immediate;   Fair Recent;   Fair  Judgement:  Fair  Insight:  Good  Psychomotor Activity:  Normal  Concentration:  Concentration: Fair and Attention Span: Fair  Recall:  Good  Fund of Knowledge: Good  Language: Good  Assets:  Desire for Improvement  ADL's:  Intact  Cognition: WNL  Prognosis:  Good   Most recent pertinent labs: 05/08/2020 GeneSight test results complete results can be found scanned in the chart under media. Important results for me to consider at present: Antidepressant class: The Lexapro that she is currently taking is in the moderate gene drug interaction category.  Wellbutrin is listed as no gene drug interaction to be used as  directed.  Other meds listed to be used as directed include clomipramine, desipramine, [name], Cymbalta and Prozac both of which she has taken in the past, [name], Fetzima, mirtazapine, nortriptyline, trazodone, Effexor, Viibryd, and Trintellix. Anxiolytics and hypnotics: Ativan is to be used as directed, and all in this category are in the use as directed category. 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  DIAGNOSES:    ICD-10-CM   1. Generalized anxiety disorder  F41.1   2. Panic disorder  F41.0   3. Major depressive disorder, recurrent episode, moderate (HCC)  F33.1   4. Memory loss  R41.3   5. History of alcohol use disorder  Z87.898   6. 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 History of drug overdose  Z91.89  Receiving Psychotherapy: No    RECOMMENDATIONS:  PDMP was reviewed. I provided 40 minutes of face to face time during this encounter, including more detailed review of GeneSight test results. After discussing these results at length I think it would be beneficial to change Lexapro to [name].  The gene site result shows [name] to be used as directed.  We discussed the fact that this drug can be beneficial for insomnia, it is FDA approved for OCD, as well as depression.  It also helps generalized anxiety.  I recommend we change to that as were weaning off of the Lexapro.  We will continue the Wellbutrin for now. We discussed the reduced folic acid conversion due to polymorphism of MTHFR.  This is associated with reduced folic acid metabolism, which is very important in brain health.  We discussed the options of Deplin or Cerefolin NAC.  I recommend the latter because NAC often helps anxiety.  They are open to this treatment and understand it is a medical food, therefore not covered under insurance.  This will be sent to them through pharmacy in [place]. Discussed increasing Ativan.  Her husband is in charge of her medications so I am comfortable prescribing  this for her.  Hopefully when the [name] reaches therapeutic level, she would not need a rescue medicine as much. D/C BuSpar  D/C Lexapro  Start Cerefolin NAC, 2 weeks of samples were given. Continue Aricept 5 mg, 1 p.o. every morning. Start [name] 100 mg, 1 p.o. nightly. Increase Ativan to 1 mg, 1 p.o. twice daily as needed with an occasional 1 midday as needed.   Continue Wellbutrin XL 150 mg daily. Copy of GeneSight test results were provided to the patient. Recommend counseling. Return in 6 weeks  Melony Overly, PA-C

## 2020-06-24 MED ORDER — CEREFOLIN NAC 6-2-600 MG PO TABS: 1.0000 | ORAL_TABLET | Freq: Every day | ORAL | 11 refills | Status: DC

## 2020-06-27 ENCOUNTER — Encounter: Payer: Self-pay | Admitting: Physician Assistant

## 2020-06-27 ENCOUNTER — Other Ambulatory Visit: Payer: Self-pay | Admitting: Physician Assistant

## 2020-06-30 ENCOUNTER — Encounter: Payer: Self-pay | Admitting: Physician Assistant

## 2020-06-30 ENCOUNTER — Ambulatory Visit (INDEPENDENT_AMBULATORY_CARE_PROVIDER_SITE_OTHER): Payer: Medicare HMO | Admitting: Physician Assistant

## 2020-06-30 ENCOUNTER — Other Ambulatory Visit: Payer: Self-pay

## 2020-06-30 DIAGNOSIS — F331 Major depressive disorder, recurrent, moderate: Secondary | ICD-10-CM

## 2020-06-30 DIAGNOSIS — F410 Panic disorder [episodic paroxysmal anxiety] without agoraphobia: Secondary | ICD-10-CM

## 2020-06-30 DIAGNOSIS — Z87898 Personal history of other specified conditions: Secondary | ICD-10-CM | POA: Diagnosis not present

## 2020-06-30 DIAGNOSIS — Z1589 Genetic susceptibility to other disease: Secondary | ICD-10-CM

## 2020-06-30 DIAGNOSIS — R69 Illness, unspecified: Secondary | ICD-10-CM | POA: Diagnosis not present

## 2020-06-30 NOTE — Progress Notes (Signed)
[facility]  Patient ID: Courtney Beard,  MRN: [id number]  PCP: Merri Brunette, MD  Date of Evaluation: 06/30/2020 Time spent:30 minutes  Chief Complaint:  Chief Complaint    Anxiety; Depression      HISTORY/CURRENT STATUS: HPI  Seen urgently. Husband, [name] with her.   LOV 06/19/2020. Seen emergently for worsening anxiety in the past 3-4 days or a week. She's been more anxious/agitated for no reason. She stopped the Ativan for a day or two, or was only taking 1 per day, but couldn't do without it. Ativan is helping but doesn't last as long. We changed the Lexapro to [name] at the last visit.  We d/c the Buspar also because it made her feel 'funny."   Since she's been more anxious, she's felt depressed. She missed 2 days of going to the gym which is extremely unlike her. Not staying in bed all the time. Not crying easily. Energy and motivation are good. Memory is still not great. No worse or better.No SI/HI.   Patient denies increased energy with decreased need for sleep, no increased talkativeness, no racing thoughts, no impulsivity or risky behaviors, no increased spending, no increased libido, no grandiosity, no increased irritability or anger, is afraid that someone will come in the house to hurt her.  And no hallucinations.  Denies dizziness, syncope, seizures, numbness, tingling, tremor, tics, unsteady gait, slurred speech, confusion. Denies muscle or joint pain, stiffness, or dystonia.  Individual Medical History/ Review of Systems: Changes? :No    Past medications for mental health diagnoses include: Lexapro, Wellbutrin SR, Klonopin (which she OD'ed on.) Melatonin, prozac, Cymbalta, Namenda, BuSpar made her feel funny, Ativan, Aricept  Allergies: Erythromycin  Current Medications:  Current Outpatient Medications:  .  acetaminophen (TYLENOL) 325 MG tablet, Take 2 tablets (650 mg total) by mouth every 6 (six) hours as needed for fever., Disp: , Rfl:  .  b  complex vitamins capsule, Take 1 capsule by mouth daily., Disp: , Rfl:  .  buPROPion (WELLBUTRIN XL) 150 MG 24 hr tablet, Take 150 mg by mouth daily., Disp: , Rfl:  .  Coenzyme Q10 (CO Q10) 100 MG CAPS, Take by mouth., Disp: , Rfl:  .  donepezil (ARICEPT) 5 MG tablet, TAKE ONE TABLET BY MOUTH EVERY MORNING, Disp: 30 tablet, Rfl: 1 .  fluvoxaMINE ([name]) 100 MG tablet, Take 1 tablet (100 mg total) by mouth at bedtime., Disp: 30 tablet, Rfl: 1 .  Grape Seed Extract 100 MG CAPS, Take 4 capsules by mouth., Disp: , Rfl:  .  LORazepam (ATIVAN) 1 MG tablet, 1 po bid prn, and may repeat 1 in mid-day occas prn., Disp: 75 tablet, Rfl: 1 .  Methylfol-Methylcob-Acetylcyst (CEREFOLIN NAC) 6-2-600 MG TABS, Take 1 tablet by mouth daily., Disp: 30 tablet, Rfl: 11 .  Omega-3 Fatty Acids (FISH OIL MAXIMUM STRENGTH) 1200 MG CPDR, Take 1,200 capsules by mouth 2 (two) times daily. , Disp: , Rfl:  .  Cholecalciferol (VITAMIN D3) 125 MCG (5000 UT) CAPS, Take by mouth.  (Patient not taking: Reported on 06/19/2020), Disp: , Rfl:  .  Ferrous Sulfate (IRON PO), Take 65 mg by mouth daily. (Patient not taking: Reported on 06/19/2020), Disp: , Rfl:  .  Multiple Vitamins-Minerals (MULTI-DAY PLUS MINERALS PO), Take 1 tablet by mouth daily. (Patient not taking: Reported on 01/31/2020), Disp: , Rfl:  .  Specialty Vitamins Products (COLLAGEN ULTRA) CAPS, Take by mouth. (Patient not taking: Reported on 01/31/2020), Disp: , Rfl:  .  UNABLE TO FIND,  Med Name: tumeric 500  Mg daily (Patient not taking: Reported on 01/31/2020), Disp: , Rfl:  Medication Side Effects: none  Family Medical/ Social History: Changes? No  MENTAL HEALTH EXAM:  There were no vitals taken for this visit.There is no height or weight on file to calculate BMI.  General Appearance: Casual, Neat and Well Groomed  Eye Contact:  Good  Speech:  Clear and Coherent and Normal Rate  Volume:  Normal  Mood:  Anxious  Affect:  Anxious  Thought Process:  Goal Directed and  Descriptions of Associations: Intact  Orientation:  Full (Time, Place, and Person)  Thought Content: Logical   Suicidal Thoughts:  No  Homicidal Thoughts:  No  Memory:  Immediate;   Fair Recent;   Fair  Judgement:  Fair  Insight:  Good  Psychomotor Activity:  Normal  Concentration:  Concentration: Fair and Attention Span: Fair  Recall:  Good  Fund of Knowledge: Good  Language: Good  Assets:  Desire for Improvement  ADL's:  Intact  Cognition: WNL  Prognosis:  Good   Most recent pertinent labs: 05/08/2020 [facility] test results complete results can be found scanned in the chart under media. Important results for me to consider at present: Antidepressant class: The Lexapro that she is currently taking is in the moderate gene drug interaction category.  Wellbutrin is listed as no gene drug interaction to be used as directed.  Other meds listed to be used as directed include clomipramine, desipramine, [name], Cymbalta and Prozac both of which she has taken in the past, [name], Fetzima, mirtazapine, nortriptyline, trazodone, Effexor, Viibryd, and Trintellix. Anxiolytics and hypnotics: Ativan is to be used as directed, and all in this category are in the use as directed category. 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  DIAGNOSES:    ICD-10-CM   1. Severe anxiety with panic  F41.0   2. Major depressive disorder, recurrent episode, moderate (HCC)  F33.1   3. 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 History of alcohol use disorder  Z87.898     Receiving Psychotherapy: No    RECOMMENDATIONS:  PDMP was reviewed. I provided 30 minutes of face to face time during this encounter. Discussed case with Dr. [name]. Increase [name] to 150 mg.  Restart buspar 15 mg 1/3 po bid for a week, then 2/3 bid. She can stop at that dose if she feels 'funny' like she did before.  Continue Cerefolin NAC, 2 weeks of samples were given. Continue Aricept 5 mg, 1 p.o. every  morning. cont Ativan to 1 mg, 1 p.o. twice daily as needed with an occasional 1 midday as needed.   Continue Wellbutrin XL 150 mg daily. Return in 4 weeks.  Melony Overly, PA-C

## 2020-07-03 ENCOUNTER — Telehealth: Payer: Self-pay | Admitting: Physician Assistant

## 2020-07-03 ENCOUNTER — Other Ambulatory Visit: Payer: Self-pay | Admitting: Physician Assistant

## 2020-07-03 MED ORDER — BUPROPION HCL ER (XL) 150 MG PO TB24: 150.0000 mg | ORAL_TABLET | Freq: Every day | ORAL | 5 refills | Status: DC

## 2020-07-03 MED ORDER — FLUVOXAMINE MALEATE 100 MG PO TABS: 150.0000 mg | ORAL_TABLET | Freq: Every day | ORAL | 1 refills | Status: DC

## 2020-07-03 NOTE — Telephone Encounter (Signed)
Please review

## 2020-07-03 NOTE — Telephone Encounter (Signed)
Prescriptions were sent

## 2020-07-03 NOTE — Telephone Encounter (Signed)
Pt needs a new script of the luvox of the 150 mg to be sent to the pharmacy and also wants a refill of the wellbutrin xl 150 mg . They want teresa to start filling this medicine that they want teresa. Pharmacy at [facility] on [address]

## 2020-07-31 ENCOUNTER — Ambulatory Visit (INDEPENDENT_AMBULATORY_CARE_PROVIDER_SITE_OTHER): Payer: Medicare HMO | Admitting: Physician Assistant

## 2020-07-31 ENCOUNTER — Other Ambulatory Visit: Payer: Self-pay

## 2020-07-31 ENCOUNTER — Encounter: Payer: Self-pay | Admitting: Physician Assistant

## 2020-07-31 DIAGNOSIS — Z87898 Personal history of other specified conditions: Secondary | ICD-10-CM | POA: Diagnosis not present

## 2020-07-31 DIAGNOSIS — R69 Illness, unspecified: Secondary | ICD-10-CM | POA: Diagnosis not present

## 2020-07-31 DIAGNOSIS — F331 Major depressive disorder, recurrent, moderate: Secondary | ICD-10-CM

## 2020-07-31 DIAGNOSIS — Z1589 Genetic susceptibility to other disease: Secondary | ICD-10-CM

## 2020-07-31 DIAGNOSIS — F411 Generalized anxiety disorder: Secondary | ICD-10-CM

## 2020-07-31 DIAGNOSIS — R413 Other amnesia: Secondary | ICD-10-CM | POA: Diagnosis not present

## 2020-07-31 MED ORDER — LORAZEPAM 1 MG PO TABS: ORAL_TABLET | ORAL | 1 refills | Status: DC

## 2020-07-31 NOTE — Progress Notes (Signed)
[facility]  Patient ID: Courtney Beard,  MRN: [id number]  PCP: Merri Brunette, MD  Date of Evaluation: 07/31/2020 Time spent:40 minutes  Chief Complaint:  Chief Complaint    Anxiety; Depression; Follow-up      HISTORY/CURRENT STATUS:  HPI  For routine med check.  Husband, [name] with her.   Over Thanksgiving, they went to their son's home in [facility]. Was there almost a week. Has been there before so not a new place. She got very anxious about going to one of their son's in-laws and they ended up not going. She stated she wanted to go home over and over, (meaning come back to [facility].) Was confused, not herself, got panicky, thought it was Christmas morning when it wasn't. "I knew I wasn't acting right, but I couldn't do anything about it."  States she is just as anxious as before.  Since her husband has retired the panic attacks do not occur as frequently.  The Ativan does help.  Takes it at least once a day and usually twice.  On rare occasion she needs it 3 times a day.  Her husband continues to dispense her medications.  She is not having suicidal thoughts but she does feel like "what is the point of living."  Not drinking any alcohol.  She is very concerned about her memory, as her mother has dementia, her grandmother had dementia, and I believe an aunt also had it.  Unsure what ages they were when it was diagnosed.  She forgets things like what she went into her room for or whether she is taking her meds or not, things like that.  But the most troubling things are having been told something just a few days ago or going somewhere within the past week or so and she cannot remember at all.  She is able to take care of ADLs.  No reports of forgetting what a utensil is for or what her toothbrush is for example.  She does continue to go to the [facility] daily.  That is her main outlet.  She loves to go there and enjoys the people in the classes that she attends.  Sometimes she stays for 3  different classes.  She does not really enjoy much else.  Energy and motivation are good.  She is not isolating.  Does not cry easily.  Has passive suicidal thoughts as noted above.  Past history is positive for 2 suicide attempts.  Denies homicidal ideation.  She had thorough psychological testing in March of this year, per Dr. Rosann Auerbach.  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  He mentions that some of the findings are not currently suggestive of a memory storage deficit or findings that would be expected in Alzheimer's disease.  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   She had a CT scan of the head ordered without contrast in 05/23/2018 which was negative for intracranial abnormality.  This was obtained when she was hospitalized for a drug overdose.  She has seen Dr. Marjory Lies, last in the fall of last year.  Because of the memory loss she had MRI of the brain without contrast 06/30/2019.  The MRI showed no acute findings and no findings to suggest dementia.  Patient denies increased energy with decreased need for sleep, no increased talkativeness, no racing thoughts, no impulsivity or risky behaviors, no increased  spending, no increased libido, no grandiosity, no increased irritability or anger, no paranoia, no AH/VH.  Denies dizziness, syncope, seizures, numbness, tingling, tremor, tics, unsteady gait, slurred speech, confusion. Denies muscle or joint pain, stiffness, or dystonia.  Individual Medical History/ Review of Systems: Changes? :No    Past medications for mental health diagnoses include: Lexapro, Wellbutrin SR, Klonopin (which she OD'ed on.) Melatonin, prozac, Cymbalta, Namenda, BuSpar made her feel funny, Ativan, Aricept  Allergies: Erythromycin  Current Medications:  Current Outpatient Medications:  .  acetaminophen (TYLENOL) 325 MG tablet, Take 2 tablets (650 mg total)  by mouth every 6 (six) hours as needed for fever., Disp: , Rfl:  .  b complex vitamins capsule, Take 1 capsule by mouth daily., Disp: , Rfl:  .  buPROPion (WELLBUTRIN XL) 150 MG 24 hr tablet, Take 1 tablet (150 mg total) by mouth daily., Disp: 30 tablet, Rfl: 5 .  Coenzyme Q10 (CO Q10) 100 MG CAPS, Take by mouth., Disp: , Rfl:  .  donepezil (ARICEPT) 5 MG tablet, TAKE ONE TABLET BY MOUTH EVERY MORNING, Disp: 30 tablet, Rfl: 1 .  fluvoxaMINE ([name]) 100 MG tablet, Take 1.5 tablets (150 mg total) by mouth at bedtime., Disp: 45 tablet, Rfl: 1 .  Grape Seed Extract 100 MG CAPS, Take 4 capsules by mouth., Disp: , Rfl:  .  Methylfol-Methylcob-Acetylcyst (CEREFOLIN NAC) 6-2-600 MG TABS, Take 1 tablet by mouth daily., Disp: 30 tablet, Rfl: 11 .  Omega-3 Fatty Acids (FISH OIL MAXIMUM STRENGTH) 1200 MG CPDR, Take 1,200 capsules by mouth 2 (two) times daily. , Disp: , Rfl:  .  Cholecalciferol (VITAMIN D3) 125 MCG (5000 UT) CAPS, Take by mouth.  (Patient not taking: No sig reported), Disp: , Rfl:  .  Ferrous Sulfate (IRON PO), Take 65 mg by mouth daily. (Patient not taking: No sig reported), Disp: , Rfl:  .  LORazepam (ATIVAN) 1 MG tablet, 1 po bid prn, and may repeat 1 in mid-day occas prn., Disp: 75 tablet, Rfl: 1 .  Multiple Vitamins-Minerals (MULTI-DAY PLUS MINERALS PO), Take 1 tablet by mouth daily. (Patient not taking: No sig reported), Disp: , Rfl:  .  Specialty Vitamins Products (COLLAGEN ULTRA) CAPS, Take by mouth. (Patient not taking: No sig reported), Disp: , Rfl:  .  UNABLE TO FIND, Med Name: tumeric 500  Mg daily (Patient not taking: No sig reported), Disp: , Rfl:  Medication Side Effects: none  Family Medical/ Social History: Changes? No  MENTAL HEALTH EXAM:  There were no vitals taken for this visit.There is no height or weight on file to calculate BMI.  General Appearance: Casual, Neat and Well Groomed  Eye Contact:  Good  Speech:  Clear and Coherent and Normal Rate  Volume:  Normal   Mood:  Anxious  Affect:  Anxious  Thought Process:  Goal Directed and Descriptions of Associations: Intact  Orientation:  Full (Time, Place, and Person)  Thought Content: Logical   Suicidal Thoughts:  No  Homicidal Thoughts:  No  Memory:  Immediate;   Fair Recent;   Poor Remote;   Poor  Judgement:  Fair  Insight:  Good  Psychomotor Activity:  Normal  Concentration:  Concentration: Fair and Attention Span: Fair  Recall:  Good  Fund of Knowledge: Good  Language: Good  Assets:  Desire for Improvement  ADL's:  Intact  Cognition: WNL  Prognosis:  Good   Most recent pertinent labs: 05/08/2020 [facility] test results complete results can be found scanned in the chart under media.  Important results for me to consider at present: Antidepressant class: The Lexapro that she is currently taking is in the moderate gene drug interaction category.  Wellbutrin is listed as no gene drug interaction to be used as directed.  Other meds listed to be used as directed include clomipramine, desipramine, [name], Cymbalta and Prozac both of which she has taken in the past, [name], Fetzima, mirtazapine, nortriptyline, trazodone, Effexor, Viibryd, and Trintellix. Anxiolytics and hypnotics: Ativan is to be used as directed, and all in this category are in the use as directed category. 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  DIAGNOSES:    ICD-10-CM   1. Memory loss  R41.3   2. Major depressive disorder, recurrent episode, moderate (HCC)  F33.1 CBC with Differential/Platelet    Comprehensive metabolic panel    TSH    Vitamin B12    Folate  3. 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 Generalized anxiety disorder  F41.1   5. History of alcohol use disorder  Z87.898     Receiving Psychotherapy: No    RECOMMENDATIONS:  PDMP was reviewed. I provided 40 minutes of face to face time during this encounter, discussing the memory loss and differential diagnosis.  I think it is time  that she see Dr. Marjory Lies again.  She is not really responding to the antidepressant, Aricept or Ativan.  Labs have not been drawn in a year or so I will check those to rule out pernicious anemia or a metabolic abnormality.  No changes will be made as far as her medicines go.   Continue [name] 150 mg, daily. Continue Cerefolin NAC. Continue Aricept 5 mg, 1 p.o. every morning. Cont Ativan to 1 mg, 1 p.o. twice daily as needed with an occasional 1 midday as needed.   Continue Wellbutrin XL 150 mg daily. Ordered CBC with differential, CMP, B12 and folate levels, TSH. Refer back to Dr. Marjory Lies.  Return in 4 weeks.  Melony Overly, PA-C

## 2020-08-02 ENCOUNTER — Telehealth: Payer: Self-pay | Admitting: Physician Assistant

## 2020-08-02 DIAGNOSIS — R69 Illness, unspecified: Secondary | ICD-10-CM | POA: Diagnosis not present

## 2020-08-02 NOTE — Telephone Encounter (Signed)
noted 

## 2020-08-02 NOTE — Telephone Encounter (Signed)
Referral faxed to [facility], Dr. Marjory Lies

## 2020-08-03 LAB — COMPREHENSIVE METABOLIC PANEL
ALT: 20 IU/L (ref 0–32)
AST: 26 IU/L (ref 0–40)
Albumin/Globulin Ratio: 2 (ref 1.2–2.2)
Albumin: 4.4 g/dL (ref 3.8–4.8)
Alkaline Phosphatase: 63 IU/L (ref 44–121)
BUN/Creatinine Ratio: 8 — ABNORMAL LOW (ref 12–28)
BUN: 8 mg/dL (ref 8–27)
Bilirubin Total: 0.3 mg/dL (ref 0.0–1.2)
CO2: 23 mmol/L (ref 20–29)
Calcium: 9.8 mg/dL (ref 8.7–10.3)
Chloride: 101 mmol/L (ref 96–106)
Creatinine, Ser: 1.05 mg/dL — ABNORMAL HIGH (ref 0.57–1.00)
GFR calc Af Amer: 64 mL/min/{1.73_m2} (ref >59)
GFR calc non Af Amer: 55 mL/min/{1.73_m2} — ABNORMAL LOW (ref >59)
Globulin, Total: 2.2 g/dL (ref 1.5–4.5)
Glucose: 77 mg/dL (ref 65–99)
Potassium: 5 mmol/L (ref 3.5–5.2)
Sodium: 141 mmol/L (ref 134–144)
Total Protein: 6.6 g/dL (ref 6.0–8.5)

## 2020-08-03 LAB — CBC WITH DIFFERENTIAL/PLATELET
Basophils Absolute: 0 10*3/uL (ref 0.0–0.2)
Basos: 0 %
EOS (ABSOLUTE): 0 10*3/uL (ref 0.0–0.4)
Eos: 0 %
Hematocrit: 37.4 % (ref 34.0–46.6)
Hemoglobin: 12 g/dL (ref 11.1–15.9)
Immature Grans (Abs): 0 10*3/uL (ref 0.0–0.1)
Immature Granulocytes: 0 %
Lymphocytes Absolute: 1.5 10*3/uL (ref 0.7–3.1)
Lymphs: 19 %
MCH: 25.7 pg — ABNORMAL LOW (ref 26.6–33.0)
MCHC: 32.1 g/dL (ref 31.5–35.7)
MCV: 80 fL (ref 79–97)
Monocytes Absolute: 0.5 10*3/uL (ref 0.1–0.9)
Monocytes: 7 %
Neutrophils Absolute: 5.5 10*3/uL (ref 1.4–7.0)
Neutrophils: 74 %
Platelets: 291 10*3/uL (ref 150–450)
RBC: 4.67 x10E6/uL (ref 3.77–5.28)
RDW: 14.2 % (ref 11.7–15.4)
WBC: 7.5 10*3/uL (ref 3.4–10.8)

## 2020-08-03 LAB — VITAMIN B12: Vitamin B-12: 1286 pg/mL — ABNORMAL HIGH (ref 232–1245)

## 2020-08-03 LAB — TSH: TSH: 0.841 u[IU]/mL (ref 0.450–4.500)

## 2020-08-03 LAB — FOLATE: Folate: >20 ng/mL (ref >3.0)

## 2020-08-15 ENCOUNTER — Other Ambulatory Visit: Payer: Self-pay | Admitting: Physician Assistant

## 2020-08-15 MED ORDER — FLUVOXAMINE MALEATE 100 MG PO TABS: 150.0000 mg | ORAL_TABLET | Freq: Every day | ORAL | 5 refills | Status: DC

## 2020-08-22 ENCOUNTER — Encounter: Payer: Self-pay | Admitting: *Deleted

## 2020-08-22 ENCOUNTER — Ambulatory Visit: Payer: Medicare HMO | Admitting: Diagnostic Neuroimaging

## 2020-08-22 VITALS — BP 134/84 | HR 77 | Ht 62.0 in | Wt 97.0 lb

## 2020-08-22 DIAGNOSIS — G3184 Mild cognitive impairment, so stated: Secondary | ICD-10-CM

## 2020-08-22 DIAGNOSIS — R69 Illness, unspecified: Secondary | ICD-10-CM | POA: Diagnosis not present

## 2020-08-22 DIAGNOSIS — F411 Generalized anxiety disorder: Secondary | ICD-10-CM | POA: Diagnosis not present

## 2020-08-22 NOTE — Patient Instructions (Signed)
   MEMORY LOSS (MCI, depression, anxiety) - Recommend to optimize nutrition, exercise, cognitively stimulating activities.  - continue evaluation and treatment by psychiatry, psychology for depression and anxiety - safety / supervision issues reviewed - daily physical activity / exercise - eat more plants / vegetables - increase social activities, brain stimulation, games, puzzles, hobbies, crafts, arts, music - aim for at least 7-8 hours sleep per night (or more) - avoid smoking and alcohol - caution with medications, finances, driving
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
## 2020-08-31 ENCOUNTER — Other Ambulatory Visit: Payer: Self-pay | Admitting: Physician Assistant

## 2020-09-19 ENCOUNTER — Ambulatory Visit: Payer: Medicare HMO | Admitting: Physician Assistant

## 2020-11-01 ENCOUNTER — Ambulatory Visit: Payer: Medicare HMO | Admitting: Family Medicine

## 2020-11-01 ENCOUNTER — Other Ambulatory Visit: Payer: Self-pay | Admitting: Physician Assistant

## 2020-11-05 ENCOUNTER — Other Ambulatory Visit: Payer: Self-pay | Admitting: Physician Assistant

## 2020-11-07 NOTE — Telephone Encounter (Signed)
Apt 3/31.

## 2020-11-09 ENCOUNTER — Ambulatory Visit (INDEPENDENT_AMBULATORY_CARE_PROVIDER_SITE_OTHER): Payer: Medicare HMO | Admitting: Physician Assistant

## 2020-11-09 ENCOUNTER — Other Ambulatory Visit: Payer: Self-pay

## 2020-11-09 ENCOUNTER — Encounter: Payer: Self-pay | Admitting: Physician Assistant

## 2020-11-09 DIAGNOSIS — Z1589 Genetic susceptibility to other disease: Secondary | ICD-10-CM | POA: Diagnosis not present

## 2020-11-09 DIAGNOSIS — F331 Major depressive disorder, recurrent, moderate: Secondary | ICD-10-CM | POA: Diagnosis not present

## 2020-11-09 DIAGNOSIS — R69 Illness, unspecified: Secondary | ICD-10-CM | POA: Diagnosis not present

## 2020-11-09 DIAGNOSIS — Z87898 Personal history of other specified conditions: Secondary | ICD-10-CM

## 2020-11-09 DIAGNOSIS — F410 Panic disorder [episodic paroxysmal anxiety] without agoraphobia: Secondary | ICD-10-CM

## 2020-11-09 DIAGNOSIS — R413 Other amnesia: Secondary | ICD-10-CM

## 2020-11-09 MED ORDER — FLUVOXAMINE MALEATE 100 MG PO TABS: 100.0000 mg | ORAL_TABLET | Freq: Every day | ORAL | 1 refills | Status: DC

## 2020-11-09 MED ORDER — LORAZEPAM 1 MG PO TABS: ORAL_TABLET | ORAL | 1 refills | Status: DC

## 2020-11-09 MED ORDER — DONEPEZIL HCL 10 MG PO TABS: 10.0000 mg | ORAL_TABLET | Freq: Every day | ORAL | 1 refills | Status: DC

## 2020-11-09 MED ORDER — BUPROPION HCL ER (XL) 150 MG PO TB24: 150.0000 mg | ORAL_TABLET | Freq: Every day | ORAL | 5 refills | Status: DC

## 2020-11-09 MED ORDER — L-METHYLFOLATE-B6-B12 3-35-2 MG PO TABS: 1.0000 | ORAL_TABLET | Freq: Every day | ORAL | 1 refills | Status: DC

## 2020-11-09 NOTE — Progress Notes (Signed)
[facility]  Patient ID: Courtney Beard,  MRN: [id number]  PCP: Merri Brunette, MD  Date of Evaluation: 11/09/2020 Time spent:30 minutes  Chief Complaint:  Chief Complaint    Memory Loss; Anxiety; Depression      HISTORY/CURRENT STATUS:  HPI  For routine med check.  Husband, [name] with her.   Saw Dr. Marjory Lies 08/22/2020. Pt and husband say she has been told she has memory loss of uncertain etiology.  No difference in treatment.  [name] ask about increasing the Aricept.  I think that is a good idea.  I have been thinking about that but hoping that neurology could shed some light on things and we could treat in a different way.  Memory is still bad.  She forgets who she is talking about a lot or recently went to her brother and his wife's new home and when she left she remarked that her son and his wife had a nice new home.  Things like that and it is very unsettling.  She does continue to go to the [facility] daily.  That is her main outlet.  She loves to go there and enjoys the people in the classes that she attends.  Sometimes she stays for 3 different classes.  She does not really enjoy much else.  Energy and motivation are good.  She is not isolating.  Does not cry easily.  No suicidal or homicidal thoughts.  Patient denies increased energy with decreased need for sleep, no increased talkativeness, no racing thoughts, no impulsivity or risky behaviors, no increased spending, no increased libido, no grandiosity, no increased irritability or anger, no paranoia, no AH/VH.  Denies dizziness, syncope, seizures, numbness, tingling, tremor, tics, unsteady gait, slurred speech, confusion. Denies muscle or joint pain, stiffness, or dystonia.  Individual Medical History/ Review of Systems: Changes? :No    Past medications for mental health diagnoses include: Lexapro, Wellbutrin SR, Klonopin (which she OD'ed on.) Melatonin, prozac, Cymbalta, Namenda, BuSpar made her feel funny,  Ativan, Aricept  Allergies: Erythromycin  Current Medications:  Current Outpatient Medications:  .  acetaminophen (TYLENOL) 325 MG tablet, Take 2 tablets (650 mg total) by mouth every 6 (six) hours as needed for fever., Disp: , Rfl:  .  b complex vitamins capsule, Take 1 capsule by mouth daily., Disp: , Rfl:  .  Coenzyme Q10 (CO Q10) 100 MG CAPS, Take by mouth., Disp: , Rfl:  .  donepezil (ARICEPT) 10 MG tablet, Take 1 tablet (10 mg total) by mouth at bedtime., Disp: 90 tablet, Rfl: 1 .  Grape Seed Extract 100 MG CAPS, Take 4 capsules by mouth., Disp: , Rfl:  .  l-methylfolate-B6-B12 (METANX) 3-35-2 MG TABS tablet, Take 1 tablet by mouth daily., Disp: 90 tablet, Rfl: 1 .  Methylfol-Methylcob-Acetylcyst (CEREFOLIN NAC) 6-2-600 MG TABS, Take 1 tablet by mouth daily., Disp: 30 tablet, Rfl: 11 .  Omega-3 Fatty Acids (FISH OIL MAXIMUM STRENGTH) 1200 MG CPDR, Take 1,200 capsules by mouth 2 (two) times daily. , Disp: , Rfl:  .  UNABLE TO FIND, Med Name: tumeric 500  Mg daily, Disp: , Rfl:  .  buPROPion (WELLBUTRIN XL) 150 MG 24 hr tablet, Take 1 tablet (150 mg total) by mouth daily., Disp: 30 tablet, Rfl: 5 .  Cholecalciferol (VITAMIN D3) 125 MCG (5000 UT) CAPS, Take by mouth.  (Patient not taking: No sig reported), Disp: , Rfl:  .  Ferrous Sulfate (IRON PO), Take 65 mg by mouth daily. (Patient not taking: No sig reported), Disp: ,  Rfl:  .  fluvoxaMINE ([name]) 100 MG tablet, Take 1 tablet (100 mg total) by mouth at bedtime., Disp: 90 tablet, Rfl: 1 .  LORazepam (ATIVAN) 1 MG tablet, TAKE ONE TABLET BY MOUTH TWICE A DAY AS NEEDED AND MAY REPEAT 1 TABLET MIDDAY OCCASIONALLY AS NEEDED, Disp: 225 tablet, Rfl: 1 .  Multiple Vitamins-Minerals (MULTI-DAY PLUS MINERALS PO), Take 1 tablet by mouth daily. (Patient not taking: No sig reported), Disp: , Rfl:  .  Specialty Vitamins Products (COLLAGEN ULTRA) CAPS, Take by mouth. (Patient not taking: No sig reported), Disp: , Rfl:  Medication Side Effects:  none  Family Medical/ Social History: Changes? No  MENTAL HEALTH EXAM:  There were no vitals taken for this visit.There is no height or weight on file to calculate BMI.  General Appearance: Casual, Neat and Well Groomed  Eye Contact:  Good  Speech:  Clear and Coherent and Normal Rate  Volume:  Normal  Mood:  Anxious  Affect:  Anxious  Thought Process:  Goal Directed and Descriptions of Associations: Intact  Orientation:  Full (Time, Place, and Person)  Thought Content: Logical   Suicidal Thoughts:  No  Homicidal Thoughts:  No  Memory:  Immediate;   Fair Recent;   Poor Remote;   Poor  Judgement:  Fair  Insight:  Good  Psychomotor Activity:  Normal  Concentration:  Concentration: Fair and Attention Span: Fair  Recall:  Good  Fund of Knowledge: Good  Language: Good  Assets:  Desire for Improvement  ADL's:  Intact  Cognition: WNL  Prognosis:  Good   Most recent pertinent labs: 05/08/2020 Gene[facility]ight test results complete results can be found scanned in the chart under media. Important results for me to consider at present: Antidepressant class: The Lexapro that she is currently taking is in the moderate gene drug interaction category.  Wellbutrin is listed as no gene drug interaction to be used as directed.  Other meds listed to be used as directed include clomipramine, desipramine, [name], Cymbalta and Prozac both of which she has taken in the past, [name], Fetzima, mirtazapine, nortriptyline, trazodone, Effexor, Viibryd, and Trintellix. Anxiolytics and hypnotics: Ativan is to be used as directed, and all in this category are in the use as directed category. 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   DIAGNOSES:    ICD-10-CM   1. Memory loss  R41.3   2. 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 Severe anxiety with panic  F41.0   4. Major depressive disorder, recurrent episode,  moderate (HCC)  F33.1   5. History of alcohol use disorder  Z87.898     Receiving Psychotherapy: No    RECOMMENDATIONS:  PDMP was reviewed. I provided 30 minutes of face-to-face time during this encounter, including time spent before and after the appointment and records review and charting. I do think it is a good idea to increase the Aricept.  10 mg is usually the max.  We will see how she does at the next visit and go from there. Changing Cerefolin NAC to generic. Increase Aricept to 10 mg p.o. daily. Cont Ativan to 1 mg, 1 p.o. twice daily as needed with an occasional 1 midday as needed.   Continue Wellbutrin XL 150 mg daily. Continue [name] 100 mg, 1 p.o. nightly. Return in 2 months.  Melony Overly, PA-C

## 2020-11-15 DIAGNOSIS — M7062 Trochanteric bursitis, left hip: Secondary | ICD-10-CM | POA: Diagnosis not present

## 2020-11-15 DIAGNOSIS — Z5181 Encounter for therapeutic drug level monitoring: Secondary | ICD-10-CM | POA: Diagnosis not present

## 2020-11-15 DIAGNOSIS — E7800 Pure hypercholesterolemia, unspecified: Secondary | ICD-10-CM | POA: Diagnosis not present

## 2020-11-15 DIAGNOSIS — E559 Vitamin D deficiency, unspecified: Secondary | ICD-10-CM | POA: Diagnosis not present

## 2020-12-04 DIAGNOSIS — M25552 Pain in left hip: Secondary | ICD-10-CM

## 2020-12-04 HISTORY — DX: Pain in left hip: M25.552

## 2020-12-05 DIAGNOSIS — M7062 Trochanteric bursitis, left hip: Secondary | ICD-10-CM | POA: Diagnosis not present

## 2020-12-05 HISTORY — DX: Trochanteric bursitis, left hip: M70.62

## 2021-01-01 ENCOUNTER — Telehealth: Payer: Self-pay | Admitting: Physician Assistant

## 2021-01-01 NOTE — Telephone Encounter (Signed)
Husband, [name], called to say that Courtney Beard needs to be seen sooner. She is having suicidal thoughts and feelings. No work in spot so what can we do?

## 2021-01-01 NOTE — Telephone Encounter (Signed)
Rtc to husband earlier and discussed pt's symptoms. Sounds like a lot of situational things contributing to her depression and SI. He's been concerned for awhile now that her medications are just not helping. This morning pt reports to husband after he got up at 6:30 am that she has been up since 4:30 am and has been thinking that she has nothing to live for, she did not mention a plan. He reports how frustrating it is to start the morning like this everyday, he's worried maybe she will do something before he wakes up. They eventually change the subject and move on about the day. I did give him information on [facility]. He reports he would take her if it does become urgent, he reports he would prefer just to work with [facility]. Asking for a work in or earlier apt very soon. Discussed increase in [name] but he feels that it won't help her, he would prefer maybe changing her medication. He reports she has been on at least 3 SSRI's and they have not been effective.   I informed him I would update [facility] with information and follow up later today.

## 2021-01-01 NOTE — Telephone Encounter (Signed)
Spoke w/ husband. She had change in routine d/t bursitis (had steroid shot) so couldn't go to gym each morning. Also [name] had to have knee replacements b/c one he had 1 1/2 years ago got infected. She has been having more SI for several weeks now.  She does not really have a plan but he is concerned because she is saying things like she does not have much to live for or things like that.  He knows to take her to [facility] if she should have worsening suicidal thoughts especially if she verbalizes a plan for suicide.  He takes care of all of her medications so she does not have access.  I will review her Gene site test results, and really considering changing to clomipramine, nortriptyline, [name], and he asks about mirtazapine.  I briefly discussed lithium because it helps with suicidal thoughts even at extremely low doses.  He has a son who is a physician and another son who is currently in [place] school so I have asked him to discuss these options with the 2 of them.  And I will get back with [name] within the next few days.

## 2021-01-01 NOTE — Telephone Encounter (Signed)
Are these passive suicidal thoughts or does she have a plan?  She may need to go to [facility].  If the thoughts are more passive, there is no access to a firearm, and her husband is still in control of her medications, then she does not have to go to urgent care or the emergency room.  In the meantime, have her increase the [name] to 150 mg total.  If she needs a prescription sent in, let me know. Thank you

## 2021-01-01 NOTE — Telephone Encounter (Signed)
Reviewed

## 2021-01-03 NOTE — Telephone Encounter (Signed)
I called and left a message on voicemail asking Courtney Beard to call me back but I will try to reach him again either later today or tomorrow.  If he calls back and I am not available and either [name] or [name] are please give the following message.  And I will need pharmacy name.  Thanks. I discussed the options with Dr. [name] and he agreed with changing [name] to [name].  Also we could add either low-dose lithium or Abilify which will help with the depression much quicker.

## 2021-01-04 NOTE — Telephone Encounter (Signed)
Called but got a message saying call could not be completed at this time. Will try tomorrow.

## 2021-01-05 ENCOUNTER — Other Ambulatory Visit: Payer: Self-pay | Admitting: Physician Assistant

## 2021-01-05 MED ORDER — DESVENLAFAXINE SUCCINATE ER 50 MG PO TB24: ORAL_TABLET | ORAL | 0 refills | Status: DC

## 2021-01-05 NOTE — Telephone Encounter (Signed)
Talked with [name].  He and I agree on weaning off the [name] (she will take 50 mg nightly for 1 week and then stop.)  At the same time we will start [name] 50 mg, 1 p.o. daily for 1 week and then increase to 2 p.o. daily.  She has an appointment with me on 01/17/2021 and at that point we will reevaluate whether we want to add Abilify or lithium with the likely goal of discontinuing the Wellbutrin.

## 2021-01-08 DIAGNOSIS — L03012 Cellulitis of left finger: Secondary | ICD-10-CM | POA: Diagnosis not present

## 2021-01-11 ENCOUNTER — Other Ambulatory Visit: Payer: Self-pay | Admitting: Physician Assistant

## 2021-01-11 DIAGNOSIS — M7062 Trochanteric bursitis, left hip: Secondary | ICD-10-CM | POA: Diagnosis not present

## 2021-01-11 DIAGNOSIS — M1612 Unilateral primary osteoarthritis, left hip: Secondary | ICD-10-CM | POA: Diagnosis not present

## 2021-01-17 ENCOUNTER — Ambulatory Visit (INDEPENDENT_AMBULATORY_CARE_PROVIDER_SITE_OTHER): Payer: Medicare HMO | Admitting: Physician Assistant

## 2021-01-17 ENCOUNTER — Other Ambulatory Visit: Payer: Self-pay

## 2021-01-17 ENCOUNTER — Encounter: Payer: Self-pay | Admitting: Physician Assistant

## 2021-01-17 DIAGNOSIS — F331 Major depressive disorder, recurrent, moderate: Secondary | ICD-10-CM | POA: Diagnosis not present

## 2021-01-17 DIAGNOSIS — R45851 Suicidal ideations: Secondary | ICD-10-CM

## 2021-01-17 DIAGNOSIS — R413 Other amnesia: Secondary | ICD-10-CM | POA: Diagnosis not present

## 2021-01-17 DIAGNOSIS — Z1589 Genetic susceptibility to other disease: Secondary | ICD-10-CM

## 2021-01-17 DIAGNOSIS — R69 Illness, unspecified: Secondary | ICD-10-CM | POA: Diagnosis not present

## 2021-01-17 MED ORDER — LORAZEPAM 1 MG PO TABS: 1.0000 mg | ORAL_TABLET | Freq: Four times a day (QID) | ORAL | 1 refills | Status: DC | PRN

## 2021-01-17 MED ORDER — LITHIUM CARBONATE 150 MG PO CAPS: 150.0000 mg | ORAL_CAPSULE | Freq: Every evening | ORAL | 1 refills | Status: DC

## 2021-01-17 NOTE — Progress Notes (Signed)
[facility]  Patient ID: Courtney Beard,  MRN: [id number]  PCP: Merri Brunette, MD  Date of Evaluation: 01/17/2021 Time spent:40 minutes  Chief Complaint:  Chief Complaint   Anxiety; Depression     HISTORY/CURRENT STATUS:  HPI  For routine med check.  Husband, [name] with her.   H/A w/ pristiq. Started exactly at same time of meds so couldn't be coincidental. [name] started to cut pill in half but that wasn't helpful with the sx of h/a so they stopped it a few days ago.  Headache resolved.  He spoke with the pharmacist about cutting the pill in half, after the fact.  Pharmacist told him that was not appropriate.  Off Aricept for 3 weeks b/c nightmares. "Wasn't helping much anyway."  Not sure if the nightmares were related to the Aricept or not.  She still enjoys going to the gym every day and works out 2 to 3 hours.  The ladies there are "my family."  She got really upset with herself this morning when talking with one of her friends there, telling them that she and her husband would be going to their son's graduation from [place] school soon.  [name] could not recall the city where he lives now.  She tearfully says it is so scary to know that you are not able to remember things.  She cries easily when thinking about that.  She understandably is terrified of dementia because her mom has it and several other family members, even some at a very young age. Energy and motivation are good.  Not isolating.  She does have passive suicidal thoughts though.  Hopeless, no reason to want to go along.  No specific plan. No HI.   Patient denies increased energy with decreased need for sleep, no increased talkativeness, no racing thoughts, no impulsivity or risky behaviors, no increased spending, no increased libido, no grandiosity, no increased irritability or anger, no paranoia, no AH/VH.  Denies dizziness, syncope, seizures, numbness, tingling, tremor, tics, unsteady gait, slurred speech,  confusion. Denies muscle or joint pain, stiffness, or dystonia.  Individual Medical History/ Review of Systems: Changes? :No    Past medications for mental health diagnoses include: Lexapro, Wellbutrin SR, Klonopin (which she OD'ed on.) Melatonin, prozac, Cymbalta, Namenda, BuSpar made her feel funny, Ativan, Aricept caused severe nightmares, [name] caused headaches.   Allergies: [name] [desvenlafaxine succinate er] and Erythromycin  Current Medications:  Current Outpatient Medications:    acetaminophen (TYLENOL) 325 MG tablet, Take 2 tablets (650 mg total) by mouth every 6 (six) hours as needed for fever., Disp: , Rfl:    b complex vitamins capsule, Take 1 capsule by mouth daily., Disp: , Rfl:    buPROPion (WELLBUTRIN XL) 150 MG 24 hr tablet, Take 1 tablet (150 mg total) by mouth daily., Disp: 30 tablet, Rfl: 5   Coenzyme Q10 (CO Q10) 100 MG CAPS, Take by mouth., Disp: , Rfl:    Ferrous Sulfate (IRON PO), Take 65 mg by mouth daily., Disp: , Rfl:    Grape Seed Extract 100 MG CAPS, Take 4 capsules by mouth., Disp: , Rfl:    l-methylfolate-B6-B12 (METANX) 3-35-2 MG TABS tablet, Take 1 tablet by mouth daily., Disp: 90 tablet, Rfl: 1   lithium carbonate 150 MG capsule, Take 1 capsule (150 mg total) by mouth at bedtime., Disp: 30 capsule, Rfl: 1   Methylfol-Methylcob-Acetylcyst (CEREFOLIN NAC) 6-2-600 MG TABS, Take 1 tablet by mouth daily., Disp: 30 tablet, Rfl: 11   Omega-3 Fatty Acids (FISH  OIL MAXIMUM STRENGTH) 1200 MG CPDR, Take 1,200 capsules by mouth 2 (two) times daily. , Disp: , Rfl:    UNABLE TO FIND, Med Name: tumeric 500  Mg daily, Disp: , Rfl:    Cholecalciferol (VITAMIN D3) 125 MCG (5000 UT) CAPS, Take by mouth.  (Patient not taking: No sig reported), Disp: , Rfl:    LORazepam (ATIVAN) 1 MG tablet, Take 1 tablet (1 mg total) by mouth every 6 (six) hours as needed for anxiety. TAKE ONE TABLET BY MOUTH TWICE A DAY AS NEEDED AND MAY REPEAT 1 TABLET MIDDAY OCCASIONALLY AS NEEDED, Disp:  120 tablet, Rfl: 1   Multiple Vitamins-Minerals (MULTI-DAY PLUS MINERALS PO), Take 1 tablet by mouth daily. (Patient not taking: No sig reported), Disp: , Rfl:    Specialty Vitamins Products (COLLAGEN ULTRA) CAPS, Take by mouth. (Patient not taking: No sig reported), Disp: , Rfl:  Medication Side Effects: none  Family Medical/ Social History: Changes? no  MENTAL HEALTH EXAM:  There were no vitals taken for this visit.There is no height or weight on file to calculate BMI.  General Appearance: Casual, Neat and Well Groomed  Eye Contact:  Good  Speech:  Clear and Coherent and Normal Rate  Volume:  Normal  Mood:  Anxious, Depressed, and Hopeless  Affect:  Depressed, Tearful, and Anxious  Thought Process:  Goal Directed and Descriptions of Associations: Intact  Orientation:  Full (Time, Place, and Person)  Thought Content: Logical   Suicidal Thoughts:  Yes.  without intent/plan  Homicidal Thoughts:  No  Memory:  Immediate;   Fair Recent;   Poor Remote;   Poor  Judgement:  Fair  Insight:  Good  Psychomotor Activity:  Normal  Concentration:  Concentration: Fair and Attention Span: Fair  Recall:  Good  Fund of Knowledge: Good  Language: Good  Assets:  Desire for Improvement  ADL's:  Intact  Cognition: WNL  Prognosis:  Good   Labs from [facility] 11/15/2020 CBC was normal CMP normal, specifically BUN 15, creatinine 0.83 Vitamin D43.2 Total cholesterol 285, HDL 123, triglycerides 80 LDL 149   05/08/2020 [facility] test results complete results can be found scanned in the chart under media. Wellbutrin is listed as no gene drug interaction to be used as directed.  Other meds listed to be used as directed include clomipramine, desipramine, [name], Cymbalta and Prozac both of which she has taken in the past, [name], Fetzima, mirtazapine, nortriptyline, trazodone, Effexor, Viibryd, and Trintellix. See Lexapro. Anxiolytics and hypnotics: Ativan is to be used as directed, and all in  this category are in the use as directed category. 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   DIAGNOSES:    ICD-10-CM   1. Major depressive disorder, recurrent episode, moderate (HCC)  F33.1     2. Memory loss  R41.3     3. 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 Suicidal ideation  R45.851       Receiving Psychotherapy: No    RECOMMENDATIONS:  PDMP was reviewed. I provided 40 minutes of face to face time during this encounter, including time spent before and after the visit in records review, complex medical decision making, and charting.  We discussed the fact that [name] cut the [name] pill in half, thinking that would help her headache.  He then found out from the pharmacist that with [name], it is not supposed  to be cut in half.  I told [name] to call our office in the future if there are any questions like that. We also talked about the Ativan.  [name] is having a lot of anxiety so I am fine to increase the dose a little, but again he needs to discuss that with me before making changes.  I think he was exaggerating a little when he said she was taking 4 to 5 pills a day but it seems that she is taking 3 on many days. Aricept isn't the type drug that they will see a difference, it's more of a drug that helps prevent memory loss. D/C Aricept by patient.  Because she still has passive suicidal thoughts, I strongly recommend lithium.  Benefits, risks and side effects were discussed.  This drug is often used at very low doses to help with depression, not at the same doses that would be used for diagnosis of bipolar disorder.  [name] and [place] both would like to try this now. Contract for safety is in place.  [facility] emergency room, call 911, suicide hotline, or call our office if the suicidal thoughts worsen. Cont Metanx.   Start lithium 150 mg, 1 p.o.  nightly. Cont Ativan to 1 mg, 1 p.o. twice daily as needed with an occasional 1 midday as needed.  Reminded them that benzodiazepines can cause confusion and memory lapses in some people, although I do not think that is the case with this.  Something we need to watch though. Continue Wellbutrin XL 150 mg daily. Return in 2 months.  Melony Overly, PA-C

## 2021-01-23 DIAGNOSIS — M25552 Pain in left hip: Secondary | ICD-10-CM | POA: Diagnosis not present

## 2021-01-30 DIAGNOSIS — M25552 Pain in left hip: Secondary | ICD-10-CM | POA: Diagnosis not present

## 2021-01-31 DIAGNOSIS — M25552 Pain in left hip: Secondary | ICD-10-CM | POA: Diagnosis not present

## 2021-02-07 DIAGNOSIS — S76312A Strain of muscle, fascia and tendon of the posterior muscle group at thigh level, left thigh, initial encounter: Secondary | ICD-10-CM | POA: Diagnosis not present

## 2021-02-07 DIAGNOSIS — M25552 Pain in left hip: Secondary | ICD-10-CM | POA: Diagnosis not present

## 2021-02-08 ENCOUNTER — Telehealth: Payer: Self-pay | Admitting: Physician Assistant

## 2021-02-08 ENCOUNTER — Other Ambulatory Visit: Payer: Self-pay

## 2021-02-08 MED ORDER — LORAZEPAM 1 MG PO TABS: 1.0000 mg | ORAL_TABLET | Freq: Four times a day (QID) | ORAL | 2 refills | Status: DC | PRN

## 2021-02-08 NOTE — Telephone Encounter (Signed)
Pended.

## 2021-02-08 NOTE — Telephone Encounter (Signed)
Pt husband called and said the ativan for 6/8 was never sent. It looks like it went to print instead. Please resubmit

## 2021-02-13 ENCOUNTER — Ambulatory Visit: Payer: Medicare HMO | Admitting: Physician Assistant

## 2021-02-14 DIAGNOSIS — S76312A Strain of muscle, fascia and tendon of the posterior muscle group at thigh level, left thigh, initial encounter: Secondary | ICD-10-CM | POA: Diagnosis not present

## 2021-02-14 DIAGNOSIS — M7062 Trochanteric bursitis, left hip: Secondary | ICD-10-CM | POA: Diagnosis not present

## 2021-03-01 ENCOUNTER — Other Ambulatory Visit: Payer: Self-pay

## 2021-03-01 ENCOUNTER — Ambulatory Visit (INDEPENDENT_AMBULATORY_CARE_PROVIDER_SITE_OTHER): Payer: Medicare HMO | Admitting: Physician Assistant

## 2021-03-01 ENCOUNTER — Encounter: Payer: Self-pay | Admitting: Physician Assistant

## 2021-03-01 DIAGNOSIS — R413 Other amnesia: Secondary | ICD-10-CM

## 2021-03-01 DIAGNOSIS — Z1589 Genetic susceptibility to other disease: Secondary | ICD-10-CM | POA: Diagnosis not present

## 2021-03-01 DIAGNOSIS — F411 Generalized anxiety disorder: Secondary | ICD-10-CM

## 2021-03-01 DIAGNOSIS — R45851 Suicidal ideations: Secondary | ICD-10-CM

## 2021-03-01 DIAGNOSIS — Z820 Family history of epilepsy and other diseases of the nervous system: Secondary | ICD-10-CM

## 2021-03-01 DIAGNOSIS — F331 Major depressive disorder, recurrent, moderate: Secondary | ICD-10-CM

## 2021-03-01 DIAGNOSIS — R69 Illness, unspecified: Secondary | ICD-10-CM | POA: Diagnosis not present

## 2021-03-01 MED ORDER — LITHIUM CARBONATE 300 MG PO TABS: 300.0000 mg | ORAL_TABLET | Freq: Every evening | ORAL | 1 refills | Status: DC

## 2021-03-01 NOTE — Progress Notes (Signed)
[facility]  Patient ID: Courtney Beard,  MRN: [id number]  PCP: Merri Brunette, MD  Date of Evaluation: 03/01/2021 Time spent:40 minutes  Chief Complaint:  Chief Complaint   Anxiety; Depression; Memory Loss; Follow-up     HISTORY/CURRENT STATUS:  HPI  For routine med check.  Husband, [name] with her.   Started Lithium 6 weeks ago. SI aren't as frequent or as intense. Very sensitive and cries a lot, just tears up easily.  That part didn't start until starting the Lithium.  She and [name] both feel it was directly related to the lithium.  No other side effects reported.  She continues to struggle with her memory.  Couldn't find her wallet yesterday, had taken her ID out, which is what she needed at an important appt w/ an attorney. Had forgotten her library card recently and she started crying b/c she'd forgotten it. Loses her glasses, her purse, and she will go to put a load of clothes in the washing machine but puts them in the dryer.  When she starts to do the controls necessary to get the washer going she realizes she put them in the dryer instead.  [name] had a total knee replacement recently and she reports that he got upset with her for some of these things that she easily forgets.  He agrees that he had spoken a little harshly to her a few times but neither of them have reported that before.  It seems related to the pain from the surgery.  Courtney Beard still enjoys going to the gym on a daily basis.  Has been enjoying her grandkids living closer now.  She had taken them to [employer] when she forgot her library card as reported above.  She does not want them to see her upset or have so many memory issues.  Energy and motivation are good.  Appetite is normal and weight is stable.  Cries easily.  Feels hopeless.  Her mom has Alzheimer's and it is difficult to watch and feel like she will be in the same shape in a few years.  She does have fleeting suicidal thoughts now, they  continue to be passive and are less than they were prior to going on the lithium.  Denies homicidal thoughts.  She still has anxiety which is worse when she forgets things and knows that she is forgetting them.  The Ativan does help.  Patient denies increased energy with decreased need for sleep, no increased talkativeness, no racing thoughts, no impulsivity or risky behaviors, no increased spending, no increased libido, no grandiosity, no increased irritability or anger, no paranoia, no AH/VH.  Review of Systems  Constitutional: Negative.   HENT: Negative.    Eyes: Negative.   Respiratory: Negative.    Cardiovascular: Negative.   Skin: Negative.   Neurological: Negative.   Endo/Heme/Allergies: Negative.   Psychiatric/Behavioral:  Positive for depression, memory loss and suicidal ideas. The patient is nervous/anxious.        Passive suicidal thoughts as noted above.   Individual Medical History/ Review of Systems: Changes? :No    Past medications for mental health diagnoses include: Lexapro, Wellbutrin SR, Klonopin (which she OD'ed on.) Melatonin, prozac, Cymbalta, Namenda, BuSpar made her feel funny, Ativan, Aricept caused severe nightmares, [name] caused headaches, Lithium, Cerafolin NAC  Allergies: [name] [desvenlafaxine succinate er] and Erythromycin  Current Medications:  Current Outpatient Medications:    acetaminophen (TYLENOL) 325 MG tablet, Take 2 tablets (650 mg total) by mouth every 6 (six) hours  as needed for fever., Disp: , Rfl:    b complex vitamins capsule, Take 1 capsule by mouth daily., Disp: , Rfl:    buPROPion (WELLBUTRIN XL) 150 MG 24 hr tablet, Take 1 tablet (150 mg total) by mouth daily., Disp: 30 tablet, Rfl: 5   Coenzyme Q10 (CO Q10) 100 MG CAPS, Take by mouth., Disp: , Rfl:    Ferrous Sulfate (IRON PO), Take 65 mg by mouth daily. 27 mg 4 times per week, Disp: , Rfl:    Grape Seed Extract 100 MG CAPS, Take 4 capsules by mouth., Disp: , Rfl:    lithium 300 MG  tablet, Take 1 tablet (300 mg total) by mouth at bedtime., Disp: 30 tablet, Rfl: 1   LORazepam (ATIVAN) 1 MG tablet, Take 1 tablet (1 mg total) by mouth every 6 (six) hours as needed for anxiety., Disp: 120 tablet, Rfl: 2   Methylfol-Methylcob-Acetylcyst (CEREFOLIN NAC) 6-2-600 MG TABS, Take 1 tablet by mouth daily., Disp: 30 tablet, Rfl: 11   Omega-3 Fatty Acids (FISH OIL MAXIMUM STRENGTH) 1200 MG CPDR, Take 1,200 capsules by mouth 2 (two) times daily. , Disp: , Rfl:    Specialty Vitamins Products (MAGNESIUM, AMINO ACID CHELATE,) 133 MG tablet, Take 1 tablet by mouth in the morning., Disp: , Rfl:    UNABLE TO FIND, Med Name: tumeric 500  Mg daily, Disp: , Rfl:    Multiple Vitamins-Minerals (MULTI-DAY PLUS MINERALS PO), Take 1 tablet by mouth daily. (Patient not taking: No sig reported), Disp: , Rfl:    Specialty Vitamins Products (COLLAGEN ULTRA) CAPS, Take by mouth. (Patient not taking: No sig reported), Disp: , Rfl:  Medication Side Effects: none  Family Medical/ Social History: Changes? no  MENTAL HEALTH EXAM:  There were no vitals taken for this visit.There is no height or weight on file to calculate BMI.  General Appearance: Casual, Neat and Well Groomed  Eye Contact:  Good  Speech:  Clear and Coherent and Normal Rate  Volume:  Normal  Mood:  Anxious, Depressed, Hopeless, and Worthless  Affect:  Depressed, Tearful, and Anxious  Thought Process:  Goal Directed and Descriptions of Associations: Intact  Orientation:  Full (Time, Place, and Person)  Thought Content: Logical   Suicidal Thoughts:  Yes.  without intent/plan  Homicidal Thoughts:  No  Memory:  Immediate;   Poor Recent;   Poor Remote;   Fair  Judgement:  Fair  Insight:  Good  Psychomotor Activity:  Normal  Concentration:  Concentration: Fair and Attention Span: Fair  Recall:  Good  Fund of Knowledge: Good  Language: Good  Assets:  Desire for Improvement  ADL's:  Intact  Cognition: WNL  Prognosis:  Good   Labs  from [facility] 11/15/2020 CBC was normal CMP normal, specifically BUN 15, creatinine 0.83 Vitamin D43.2 Total cholesterol 285, HDL 123, triglycerides 80 LDL 149   05/08/2020 [facility] test results complete results can be found scanned in the chart under media. Wellbutrin is listed as no gene drug interaction to be used as directed.  Other meds listed to be used as directed include clomipramine, desipramine, [name], Cymbalta and Prozac both of which she has taken in the past, [name], Fetzima, mirtazapine, nortriptyline, trazodone, Effexor, Viibryd, and Trintellix. See Lexapro. Anxiolytics and hypnotics: Ativan is to be used as directed, and all in this category are in the use as directed category. 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  prior genetic testing revealed that she had at least 1  APO E 4 allele.   DIAGNOSES:    ICD-10-CM   1. Generalized anxiety disorder  F41.1 Basic metabolic panel    Lithium level    TSH    2. Major depressive disorder, recurrent episode, moderate (HCC)  F33.1 Basic metabolic panel    Lithium level    TSH    3. Memory loss  R41.3     4. 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 Suicidal ideation  R45.851     6. Family history of Alzheimer's disease  Z82.0        Receiving Psychotherapy: No    RECOMMENDATIONS:  PDMP was reviewed. I provided 40 minutes of face to face time during this encounter, including time spent before and after the visit in records review, complex medical decision making, and charting.  I strongly recommend increasing lithium.  She is on an extremely low dose and even increasing it, it's still low.  I believe it has helped decrease the SI and I doubt it has caused the increased crying and sensitivity, but we'll watch for that. She and [name] both are nervous about Lithium, having heard bad things about it. Plus one of their sons is a physician and the other is a PA, and they're concerned about it  as well. I assured them it's very safe, especially at very low doses like she is on. We can draw labs to have a baseline of Kidney fx, TSH, and of course monitor Li level. They are willing to try it.  Contract for safety in place. Call [phone], got to [facility] or ER if SI worsen or become active.  Cont Wellbutrin XL 150 mg q am. Increase lithium to 300 mg 1 p.o. nightly. Cont Ativan to 1 mg, 1 p.o. twice daily as needed with an occasional 1 midday as needed.  Continue to watch to see if this is related to lapses in memory or not.  Cont Cerafolin NAC. Draw labs in 7 days give or take a day, as ordered above. Return in 6 weeks.  At next OV, need to discuss other options for memory:  Exelon, ? [facility], will need to see neuro again.   Melony Overly, PA-C

## 2021-03-05 DIAGNOSIS — M25552 Pain in left hip: Secondary | ICD-10-CM | POA: Diagnosis not present

## 2021-03-14 DIAGNOSIS — F331 Major depressive disorder, recurrent, moderate: Secondary | ICD-10-CM | POA: Diagnosis not present

## 2021-03-14 DIAGNOSIS — R69 Illness, unspecified: Secondary | ICD-10-CM | POA: Diagnosis not present

## 2021-03-15 LAB — BASIC METABOLIC PANEL
BUN/Creatinine Ratio: 16 (ref 12–28)
BUN: 13 mg/dL (ref 8–27)
CO2: 24 mmol/L (ref 20–29)
Calcium: 9.9 mg/dL (ref 8.7–10.3)
Chloride: 104 mmol/L (ref 96–106)
Creatinine, Ser: 0.83 mg/dL (ref 0.57–1.00)
Glucose: 80 mg/dL (ref 65–99)
Potassium: 4.9 mmol/L (ref 3.5–5.2)
Sodium: 142 mmol/L (ref 134–144)
eGFR: 77 mL/min/{1.73_m2} (ref >59)

## 2021-03-15 LAB — LITHIUM LEVEL: Lithium Lvl: 0.3 mmol/L — ABNORMAL LOW (ref 0.5–1.2)

## 2021-03-15 LAB — TSH: TSH: 1.89 u[IU]/mL (ref 0.450–4.500)

## 2021-03-15 NOTE — Progress Notes (Signed)
Courtney Beard, please call patient and also speak with her husband Courtney Beard. Her lithium level is 0.3, which is low but expected on the low dose that she is taking.   Her kidney function tests are completely normal.  Blood sugar, sodium, potassium, calcium are also normal. TSH is normal as well.

## 2021-03-27 DIAGNOSIS — M7062 Trochanteric bursitis, left hip: Secondary | ICD-10-CM | POA: Diagnosis not present

## 2021-04-20 ENCOUNTER — Ambulatory Visit (INDEPENDENT_AMBULATORY_CARE_PROVIDER_SITE_OTHER): Payer: Medicare HMO | Admitting: Physician Assistant

## 2021-04-20 ENCOUNTER — Encounter: Payer: Self-pay | Admitting: Physician Assistant

## 2021-04-20 ENCOUNTER — Other Ambulatory Visit: Payer: Self-pay

## 2021-04-20 DIAGNOSIS — F411 Generalized anxiety disorder: Secondary | ICD-10-CM

## 2021-04-20 DIAGNOSIS — R413 Other amnesia: Secondary | ICD-10-CM

## 2021-04-20 DIAGNOSIS — R69 Illness, unspecified: Secondary | ICD-10-CM | POA: Diagnosis not present

## 2021-04-20 DIAGNOSIS — F331 Major depressive disorder, recurrent, moderate: Secondary | ICD-10-CM

## 2021-04-20 MED ORDER — BUPROPION HCL ER (XL) 300 MG PO TB24: 300.0000 mg | ORAL_TABLET | Freq: Every day | ORAL | 1 refills | Status: DC

## 2021-04-20 NOTE — Progress Notes (Signed)
[facility]  Patient ID: Courtney Beard,  MRN: [id number]  PCP: Merri Brunette, MD  Date of Evaluation: 04/20/2021  time spent:40 minutes  Chief Complaint:  Chief Complaint   Follow-up; Depression; Anxiety     HISTORY/CURRENT STATUS:  HPI  For routine med check.  Husband, [name] with her.   Patient wakes up daily with a H/A, takes tylenol which helps. Going on for about a month, or possibly longer, has been states they started getting more frequent once we increased lithium from 150 to 300 mg.  States pain is on forehead, not as severe as migraines that she has had in the past.  "They do not bring me to my knees."  The headache recurs at some point during the day and again Tylenol helps.  No visual changes, slurred speech, paresthesias, weakness in extremities, dizziness or other neurologic deficits.  She states her memory is bad. Gets confused. Not sure the day of the week. Asks about her 2 brothers that are dead. Sometimes she wakes up thinking they have a 3rd child. Memory has worsened in the past 2 months per [name]. He has to help her find her bag, glasses or things like that. Seems worse since we increased the Lithium about 6 weeks ago.  Feelings or worthlessness, 'nobody needs me' cries easily, not isolating, still enjoys working out daily.  States she does have suicidal thoughts but [name] reports since being on the lithium especially at the higher dose she has not been looking on the Internet at ways people kill themselves.  'So at least that is better.'  Neither of them feel like it is worth the trade-off with the confusion and memory worsening and possibly the headaches related to the lithium.  Has no appetite but weight is stable.  Sleeps okay.  ADLs are within normal limits except loses things as noted above.  Has passive suicidal thoughts, no homicidal thoughts.  Patient denies increased energy with decreased need for sleep, no increased talkativeness, no racing  thoughts, no impulsivity or risky behaviors, no increased spending, no increased libido, no grandiosity, no increased irritability or anger, and although no auditory or visual hallucinations are reported, she does think they have a third child and sometimes that there is a baby in the house, and she also asked about her 2 brothers who are deceased.  Review of Systems  Constitutional:        See HPI  HENT: Negative.    Eyes: Negative.   Respiratory: Negative.    Cardiovascular: Negative.   Gastrointestinal: Negative.   Genitourinary: Negative.   Musculoskeletal: Negative.   Skin: Negative.   Neurological:  Positive for headaches.       See HPI  Endo/Heme/Allergies: Negative.   Psychiatric/Behavioral:         See HPI    Individual Medical History/ Review of Systems: Changes? :No    Past medications for mental health diagnoses include: Lexapro, Wellbutrin SR, Klonopin (which she OD'ed on.) Melatonin, prozac, Cymbalta, Namenda, BuSpar made her feel funny, Ativan, Aricept caused severe nightmares, [name] caused headaches, Lithium, Cerafolin NAC  Allergies: [name] [desvenlafaxine succinate er] and Erythromycin  Current Medications:  Current Outpatient Medications:    acetaminophen (TYLENOL) 325 MG tablet, Take 2 tablets (650 mg total) by mouth every 6 (six) hours as needed for fever., Disp: , Rfl:    b complex vitamins capsule, Take 1 capsule by mouth daily., Disp: , Rfl:    Coenzyme Q10 (CO Q10) 100 MG CAPS, Take  by mouth., Disp: , Rfl:    Ferrous Sulfate (IRON PO), Take 65 mg by mouth daily. 27 mg 4 times per week, Disp: , Rfl:    Grape Seed Extract 100 MG CAPS, Take 4 capsules by mouth., Disp: , Rfl:    lithium 300 MG tablet, Take 1 tablet (300 mg total) by mouth at bedtime., Disp: 30 tablet, Rfl: 1   LORazepam (ATIVAN) 1 MG tablet, Take 1 tablet (1 mg total) by mouth every 6 (six) hours as needed for anxiety., Disp: 120 tablet, Rfl: 2   Methylfol-Methylcob-Acetylcyst (CEREFOLIN  NAC) 6-2-600 MG TABS, Take 1 tablet by mouth daily., Disp: 30 tablet, Rfl: 11   Omega-3 Fatty Acids (FISH OIL MAXIMUM STRENGTH) 1200 MG CPDR, Take 1,200 capsules by mouth 2 (two) times daily. , Disp: , Rfl:    Specialty Vitamins Products (MAGNESIUM, AMINO ACID CHELATE,) 133 MG tablet, Take 1 tablet by mouth in the morning., Disp: , Rfl:    UNABLE TO FIND, Med Name: tumeric 500  Mg daily, Disp: , Rfl:    Multiple Vitamins-Minerals (MULTI-DAY PLUS MINERALS PO), Take 1 tablet by mouth daily. (Patient not taking: No sig reported), Disp: , Rfl:    Specialty Vitamins Products (COLLAGEN ULTRA) CAPS, Take by mouth. (Patient not taking: No sig reported), Disp: , Rfl:  Medication Side Effects: none  Family Medical/ Social History: Changes? no  MENTAL HEALTH EXAM:  There were no vitals taken for this visit.There is no height or weight on file to calculate BMI.  General Appearance: Casual, Neat and Well Groomed  Eye Contact:  Good  Speech:  Clear and Coherent and Normal Rate  Volume:  Normal  Mood:  Anxious, Depressed, Hopeless, and Worthless  Affect:  Depressed, Tearful, and Anxious  Thought Process:  Goal Directed and Descriptions of Associations: Circumstantial  Orientation:  Full (Time, Place, and Person)  Thought Content:  See HPI    Suicidal Thoughts:  Yes.  without intent/plan  Homicidal Thoughts:  No  Memory:  Immediate;   Poor Recent;   Poor Remote;   Fair  Judgement:  Fair  Insight:  Good  Psychomotor Activity:  Normal  Concentration:  Concentration: Fair and Attention Span: Fair  Recall:  Good  Fund of Knowledge: Good  Language: Good  Assets:  Desire for Improvement  ADL's:  Intact  Cognition: WNL  Prognosis:  Fair   03/14/2021 BUN 13, creatinine 0.8 Lithium level 0.3 TSH 1.89.   05/08/2020 Gene[facility] test results complete results can be found scanned in the chart under media. Wellbutrin is listed as no gene drug interaction to be used as directed.  Other meds listed to be  used as directed include clomipramine, desipramine, [name], Cymbalta and Prozac both of which she has taken in the past, [name], Fetzima, mirtazapine, nortriptyline, trazodone, Effexor, Viibryd, and Trintellix. See Lexapro. Anxiolytics and hypnotics: Ativan is to be used as directed, and all in this category are in the use as directed category. 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   DIAGNOSES:  No diagnosis found.    Receiving Psychotherapy: No    RECOMMENDATIONS:  PDMP was reviewed.  Last Ativan filled 04/12/2021. I provided 40 minutes of face to face time during this encounter, including time spent before and after the visit in records review, complex medical decision making, counseling pertinent to today's visit, and charting.  Unfortunately, Betty's condition is not improving  and memory may be worsening, it is difficult to tell.  [name] reports she seems better when she is at her appointment like this.   I would like her to see neurology, Dr. Marjory Lies, again, to make sure I am not missing anything.  We may need to retry Namenda and/or Aricept, I did not discuss this with them today.  In the past they said it was not effective but without understanding both drugs are more to halt the depression of dementia rather than improve memory. I recommend they go to marriage counseling, because [name] has stated several times that he is getting "short" with her and does not mean to be.  When I mention marriage counseling [name] got really upset and I explained to her it is not because I feel that something is wrong in their marriage, but more so for both of them to cope with this stage of their lives.  She understood and calmed down.  [name] will contact [facility] to get an appointment. In the meantime I recommend increasing Wellbutrin.  This may be very helpful but I did warn them that  it can increase anxiety, usually only for a few days or no more than about 10 days if anxiety is increased at all.  If it continues or is intolerable at any point, let me know and we will decrease the dose. Because of little positive effect with the lithium and the possibility of causing the headaches we all agree it is best to stop it. Increase Wellbutrin XL to 300 mg q am. Wean off Lithium 300 mg, take 1/2 qd for 4 days then stop.  Cont Ativan to 1 mg, 1 p.o. qid prn.  Cont Cerafolin NAC. Refer back to Dr. Marjory Lies. Refer to [employer]. Return in 6 weeks.  Melony Overly, PA-C

## 2021-04-20 NOTE — Patient Instructions (Signed)
Call Dr. Marjory Lies for appt  Call [facility] for counseling

## 2021-04-26 ENCOUNTER — Telehealth: Payer: Self-pay | Admitting: Physician Assistant

## 2021-04-26 NOTE — Telephone Encounter (Signed)
Referral has been faxed to Dr. Milbert Coulter at [facility].  Courtney Beard has been notified about the referral to expect a call from them to schedule.  I advised him to request to be put on a cancellation list if the appt. Is far out.

## 2021-04-27 ENCOUNTER — Other Ambulatory Visit: Payer: Self-pay | Admitting: Physician Assistant

## 2021-04-29 NOTE — Telephone Encounter (Signed)
reviewed

## 2021-05-13 ENCOUNTER — Other Ambulatory Visit: Payer: Self-pay | Admitting: Physician Assistant

## 2021-05-14 NOTE — Telephone Encounter (Signed)
Last filled 9/1

## 2021-05-22 DIAGNOSIS — F330 Major depressive disorder, recurrent, mild: Secondary | ICD-10-CM | POA: Diagnosis not present

## 2021-05-22 DIAGNOSIS — R636 Underweight: Secondary | ICD-10-CM | POA: Diagnosis not present

## 2021-05-22 DIAGNOSIS — Z681 Body mass index (BMI) 19 or less, adult: Secondary | ICD-10-CM | POA: Diagnosis not present

## 2021-05-22 DIAGNOSIS — I951 Orthostatic hypotension: Secondary | ICD-10-CM | POA: Diagnosis not present

## 2021-05-22 DIAGNOSIS — Z881 Allergy status to other antibiotic agents status: Secondary | ICD-10-CM | POA: Diagnosis not present

## 2021-05-22 DIAGNOSIS — Z87891 Personal history of nicotine dependence: Secondary | ICD-10-CM | POA: Diagnosis not present

## 2021-05-22 DIAGNOSIS — R45851 Suicidal ideations: Secondary | ICD-10-CM | POA: Diagnosis not present

## 2021-05-22 DIAGNOSIS — F411 Generalized anxiety disorder: Secondary | ICD-10-CM | POA: Diagnosis not present

## 2021-05-22 DIAGNOSIS — R69 Illness, unspecified: Secondary | ICD-10-CM | POA: Diagnosis not present

## 2021-06-06 ENCOUNTER — Encounter: Payer: Self-pay | Admitting: Physician Assistant

## 2021-06-06 ENCOUNTER — Other Ambulatory Visit: Payer: Self-pay

## 2021-06-06 ENCOUNTER — Ambulatory Visit (INDEPENDENT_AMBULATORY_CARE_PROVIDER_SITE_OTHER): Payer: Medicare HMO | Admitting: Physician Assistant

## 2021-06-06 DIAGNOSIS — F410 Panic disorder [episodic paroxysmal anxiety] without agoraphobia: Secondary | ICD-10-CM | POA: Diagnosis not present

## 2021-06-06 DIAGNOSIS — R413 Other amnesia: Secondary | ICD-10-CM | POA: Diagnosis not present

## 2021-06-06 DIAGNOSIS — R69 Illness, unspecified: Secondary | ICD-10-CM | POA: Diagnosis not present

## 2021-06-06 DIAGNOSIS — F3341 Major depressive disorder, recurrent, in partial remission: Secondary | ICD-10-CM

## 2021-06-06 DIAGNOSIS — Z1589 Genetic susceptibility to other disease: Secondary | ICD-10-CM | POA: Diagnosis not present

## 2021-06-06 MED ORDER — BUPROPION HCL ER (XL) 300 MG PO TB24: 300.0000 mg | ORAL_TABLET | Freq: Every day | ORAL | 1 refills | Status: DC

## 2021-06-06 MED ORDER — LORAZEPAM 1 MG PO TABS: 1.0000 mg | ORAL_TABLET | Freq: Four times a day (QID) | ORAL | 2 refills | Status: DC | PRN

## 2021-06-06 NOTE — Progress Notes (Signed)
[facility]  Patient ID: Courtney Beard,  MRN: [id number]  PCP: Merri Brunette, MD  Date of Evaluation: 06/06/2021  time spent:30 minutes  Chief Complaint:  Chief Complaint   Anxiety; Depression; Memory Loss; Follow-up     HISTORY/CURRENT STATUS:  HPI  For routine med check.  Husband, Courtney Beard with her.   We've referred her back to neuro for psychological testing but won't be able to get in for several months. Still has a lot of anxiety. Worries about things every day, husband tells her not to worry but she gets frustrated with him b/c "I just can't 'not' worry." Very bad memory. No worse though.   Continues to have a lot of anxiety.  She is not sure if it is only due to the fact that she knows she is forgetting things, she feels that her husband gets really frustrated with her when she does not remember things or combination of causes.  The Ativan does help.  Courtney Beard still dispenses the Ativan.  They report 'out of body experience' that Courtney Beard says has been going on for maybe 4 months. Maybe 2-3 per week and sometimes none at all. She says she hears muffled voices, feels like she's on the outside looking in.  It only lasts a few seconds.  She still enjoys going to the gym every day.  Energy and motivation are good sleeps well.  Not isolating.  Appetite is normal and weight is stable.  No suicidal or homicidal thoughts.  Patient denies increased energy with decreased need for sleep, no increased talkativeness, no racing thoughts, no impulsivity or risky behaviors, no increased spending, no increased libido, no grandiosity, no increased irritability or anger, and no hallucinations.  Individual Medical History/ Review of Systems: Changes? :No    Past medications for mental health diagnoses include: Lexapro, Wellbutrin SR, Klonopin (which she OD'ed on.) Melatonin, prozac, Cymbalta, Namenda, BuSpar made her feel funny, Ativan, Aricept caused severe nightmares, [name] caused  headaches, Lithium, Cerafolin NAC  Allergies: [name] [desvenlafaxine succinate er] and Erythromycin  Current Medications:  Current Outpatient Medications:    acetaminophen (TYLENOL) 325 MG tablet, Take 2 tablets (650 mg total) by mouth every 6 (six) hours as needed for fever., Disp: , Rfl:    b complex vitamins capsule, Take 1 capsule by mouth daily., Disp: , Rfl:    Coenzyme Q10 (CO Q10) 100 MG CAPS, Take by mouth., Disp: , Rfl:    Ferrous Sulfate (IRON PO), Take 65 mg by mouth daily. 27 mg 4 times per week, Disp: , Rfl:    Grape Seed Extract 100 MG CAPS, Take 4 capsules by mouth., Disp: , Rfl:    Methylfol-Methylcob-Acetylcyst (CEREFOLIN NAC) 6-2-600 MG TABS, Take 1 tablet by mouth daily., Disp: 30 tablet, Rfl: 11   Omega-3 Fatty Acids (FISH OIL MAXIMUM STRENGTH) 1200 MG CPDR, Take 1,200 capsules by mouth 2 (two) times daily. , Disp: , Rfl:    Specialty Vitamins Products (MAGNESIUM, AMINO ACID CHELATE,) 133 MG tablet, Take 1 tablet by mouth in the morning., Disp: , Rfl:    UNABLE TO FIND, Med Name: tumeric 500  Mg daily, Disp: , Rfl:    buPROPion (WELLBUTRIN XL) 300 MG 24 hr tablet, Take 1 tablet (300 mg total) by mouth daily., Disp: 90 tablet, Rfl: 1   LORazepam (ATIVAN) 1 MG tablet, Take 1 tablet (1 mg total) by mouth every 6 (six) hours as needed. for anxiety, Disp: 120 tablet, Rfl: 2   Multiple Vitamins-Minerals (MULTI-DAY PLUS MINERALS PO),  Take 1 tablet by mouth daily. (Patient not taking: No sig reported), Disp: , Rfl:    Specialty Vitamins Products (COLLAGEN ULTRA) CAPS, Take by mouth. (Patient not taking: No sig reported), Disp: , Rfl:  Medication Side Effects: none  Family Medical/ Social History: Changes? no  MENTAL HEALTH EXAM:  There were no vitals taken for this visit.There is no height or weight on file to calculate BMI.  General Appearance: Casual, Neat and Well Groomed  Eye Contact:  Good  Speech:  Clear and Coherent and Normal Rate  Volume:  Normal  Mood:  Anxious   Affect:  Anxious  Thought Process:  Goal Directed and Descriptions of Associations: Circumstantial  Orientation:  Full (Time, Place, and Person)  Thought Content:  See HPI    Suicidal Thoughts:  No  Homicidal Thoughts:  No  Memory:  Immediate;   Poor Recent;   Poor Remote;   Fair  Judgement:  Fair  Insight:  Good  Psychomotor Activity:  Normal  Concentration:  Concentration: Fair and Attention Span: Fair  Recall:  Good  Fund of Knowledge: Good  Language: Good  Assets:  Desire for Improvement  ADL's:  Intact  Cognition: WNL  Prognosis:  Fair    05/08/2020 [facility]Sight test results complete results can be found scanned in the chart under media. Wellbutrin is listed as no gene drug interaction to be used as directed.  Other meds listed to be used as directed include clomipramine, desipramine, [name], Cymbalta and Prozac both of which she has taken in the past, [name], Fetzima, mirtazapine, nortriptyline, trazodone, Effexor, Viibryd, and Trintellix. See Lexapro. Anxiolytics and hypnotics: Ativan is to be used as directed, and all in this category are in the use as directed category. 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   DIAGNOSES:    ICD-10-CM   1. Severe anxiety with panic  F41.0     2. Memory loss  R41.3     3. Recurrent major depressive disorder, in partial remission (HCC)  F33.41     4. 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  Last Ativan filled 05/15/2021. I provided 20 minutes of face to face time during this encounter, including time spent before and after the visit in records review, complex medical decision making, counseling pertinent to today's visit, and charting.  At this point, she is stable as far as medications go the Ativan is helping although short-lived.  Wellbutrin is helping with the  depression thumb and she is not having suicidal thoughts now.  No changes will be made. We again discussed counseling.  Courtney Beard states he has been looking for a counselor for her but finding someone who is a Medicare provider has been difficult, even through [employer]  I recommend he [facility] and see if they have a counselor there that accepts Medicare, or if they can recommend someone he does. Continue Wellbutrin XL 300 mg q am. Cont Ativan to 1 mg, 1 p.o. qid prn.  Cont Cerafolin NAC. She has an appointment for repeat neuropsychological testing sometime within the next 3 or 4 months.   Return in 3 months.Melony Overly, PA-C

## 2021-06-19 ENCOUNTER — Other Ambulatory Visit: Payer: Self-pay | Admitting: Physician Assistant

## 2021-07-17 DIAGNOSIS — E7800 Pure hypercholesterolemia, unspecified: Secondary | ICD-10-CM | POA: Diagnosis not present

## 2021-07-17 DIAGNOSIS — Z1389 Encounter for screening for other disorder: Secondary | ICD-10-CM | POA: Diagnosis not present

## 2021-07-17 DIAGNOSIS — F324 Major depressive disorder, single episode, in partial remission: Secondary | ICD-10-CM | POA: Diagnosis not present

## 2021-07-17 DIAGNOSIS — Z124 Encounter for screening for malignant neoplasm of cervix: Secondary | ICD-10-CM | POA: Diagnosis not present

## 2021-07-17 DIAGNOSIS — R69 Illness, unspecified: Secondary | ICD-10-CM | POA: Diagnosis not present

## 2021-07-17 DIAGNOSIS — Z0000 Encounter for general adult medical examination without abnormal findings: Secondary | ICD-10-CM | POA: Diagnosis not present

## 2021-07-17 DIAGNOSIS — Z1159 Encounter for screening for other viral diseases: Secondary | ICD-10-CM | POA: Diagnosis not present

## 2021-07-17 DIAGNOSIS — F419 Anxiety disorder, unspecified: Secondary | ICD-10-CM | POA: Diagnosis not present

## 2021-07-17 DIAGNOSIS — Z23 Encounter for immunization: Secondary | ICD-10-CM | POA: Diagnosis not present

## 2021-08-28 DIAGNOSIS — Z0100 Encounter for examination of eyes and vision without abnormal findings: Secondary | ICD-10-CM | POA: Diagnosis not present

## 2021-08-28 DIAGNOSIS — H524 Presbyopia: Secondary | ICD-10-CM | POA: Diagnosis not present

## 2021-09-06 ENCOUNTER — Other Ambulatory Visit: Payer: Self-pay

## 2021-09-06 ENCOUNTER — Encounter: Payer: Self-pay | Admitting: Physician Assistant

## 2021-09-06 ENCOUNTER — Ambulatory Visit (INDEPENDENT_AMBULATORY_CARE_PROVIDER_SITE_OTHER): Payer: Medicare HMO | Admitting: Physician Assistant

## 2021-09-06 DIAGNOSIS — F410 Panic disorder [episodic paroxysmal anxiety] without agoraphobia: Secondary | ICD-10-CM | POA: Diagnosis not present

## 2021-09-06 DIAGNOSIS — R69 Illness, unspecified: Secondary | ICD-10-CM | POA: Diagnosis not present

## 2021-09-06 DIAGNOSIS — Z1589 Genetic susceptibility to other disease: Secondary | ICD-10-CM

## 2021-09-06 DIAGNOSIS — F331 Major depressive disorder, recurrent, moderate: Secondary | ICD-10-CM

## 2021-09-06 MED ORDER — SERTRALINE HCL 25 MG PO TABS: 25.0000 mg | ORAL_TABLET | Freq: Every day | ORAL | 1 refills | Status: DC

## 2021-09-06 NOTE — Progress Notes (Signed)
[facility]  Patient ID: Courtney Beard,  MRN: [id number]  PCP: Merri Brunette, MD  Date of Evaluation: 09/06/2021  time spent:30 minutes  Chief Complaint:  Chief Complaint   Memory Loss; Anxiety; Depression; Follow-up     HISTORY/CURRENT STATUS:  HPI  For routine med check.  Husband, [name] with her.   Continues to be concerned about her memory. Not worse according to her and [name]. Anxiety is the worst problem right now. Always has a sense that something bad may happen. Gets panicky for no real reason. Ativan helps when needed. No SE from it, like dizziness, imbalance, etc.   Continues to work out almost daily. Enjoys that. Energy and motivation are good. Cries easily at times.  Is very proud of one of her sons who recently passed his PANCE and has a job in [employer] where he wanted to work.  "That's been exciting!" Sleeps pretty good. No SI/HI.   Patient denies increased energy with decreased need for sleep, no increased talkativeness, no racing thoughts, no impulsivity or risky behaviors, no increased spending, no increased libido, no grandiosity, no increased irritability or anger, and no hallucinations.  Review of Systems  Constitutional: Negative.   HENT: Negative.    Eyes: Negative.   Respiratory: Negative.    Cardiovascular: Negative.   Gastrointestinal: Negative.   Genitourinary: Negative.   Musculoskeletal: Negative.   Skin: Negative.   Neurological: Negative.   Endo/Heme/Allergies: Negative.   Psychiatric/Behavioral:  Positive for memory loss. The patient is nervous/anxious.        See HPI    Individual Medical History/ Review of Systems: Changes? :No    Past medications for mental health diagnoses include: Lexapro, Wellbutrin SR, Klonopin (which she OD'ed on.) Melatonin, prozac, Cymbalta, Namenda, BuSpar made her feel funny, Ativan, Aricept caused severe nightmares, [name] caused headaches, Lithium, Cerafolin NAC, [name] wasn't helpful  Allergies:  [name] [desvenlafaxine succinate er] and Erythromycin  Current Medications:  Current Outpatient Medications:    acetaminophen (TYLENOL) 325 MG tablet, Take 2 tablets (650 mg total) by mouth every 6 (six) hours as needed for fever., Disp: , Rfl:    b complex vitamins capsule, Take 1 capsule by mouth daily., Disp: , Rfl:    buPROPion (WELLBUTRIN XL) 300 MG 24 hr tablet, Take 1 tablet (300 mg total) by mouth daily., Disp: 90 tablet, Rfl: 1   Coenzyme Q10 (CO Q10) 100 MG CAPS, Take by mouth., Disp: , Rfl:    Ferrous Sulfate (IRON PO), Take 65 mg by mouth daily. 27 mg 4 times per week, Disp: , Rfl:    Grape Seed Extract 100 MG CAPS, Take 4 capsules by mouth., Disp: , Rfl:    LORazepam (ATIVAN) 1 MG tablet, Take 1 tablet (1 mg total) by mouth every 6 (six) hours as needed. for anxiety, Disp: 120 tablet, Rfl: 2   Methylfol-Methylcob-Acetylcyst (CEREFOLIN NAC) 6-2-600 MG TABS, Take 1 tablet by mouth daily., Disp: 30 tablet, Rfl: 11   Omega-3 Fatty Acids (FISH OIL MAXIMUM STRENGTH) 1200 MG CPDR, Take 1,200 capsules by mouth 2 (two) times daily. , Disp: , Rfl:    sertraline (ZOLOFT) 25 MG tablet, Take 1 tablet (25 mg total) by mouth daily., Disp: 30 tablet, Rfl: 1   Specialty Vitamins Products (MAGNESIUM, AMINO ACID CHELATE,) 133 MG tablet, Take 1 tablet by mouth in the morning., Disp: , Rfl:    UNABLE TO FIND, Med Name: tumeric 500  Mg daily, Disp: , Rfl:    Multiple Vitamins-Minerals (MULTI-DAY PLUS MINERALS  PO), Take 1 tablet by mouth daily. (Patient not taking: Reported on 01/31/2020), Disp: , Rfl:    Specialty Vitamins Products (COLLAGEN ULTRA) CAPS, Take by mouth. (Patient not taking: Reported on 01/31/2020), Disp: , Rfl:  Medication Side Effects: none  Family Medical/ Social History: Changes? no  MENTAL HEALTH EXAM:  There were no vitals taken for this visit.There is no height or weight on file to calculate BMI.  General Appearance: Casual, Neat and Well Groomed  Eye Contact:  Good  Speech:   Clear and Coherent and Normal Rate  Volume:  Normal  Mood:  Anxious  Affect:  Anxious  Thought Process:  Goal Directed and Descriptions of Associations: Circumstantial  Orientation:  Full (Time, Place, and Person)  Thought Content:  See HPI    Suicidal Thoughts:  No  Homicidal Thoughts:  No  Memory:  Immediate;   Poor Recent;   Poor Remote;   Fair  Judgement:  Fair  Insight:  Good  Psychomotor Activity:  Normal  Concentration:  Concentration: Fair and Attention Span: Fair  Recall:  Good  Fund of Knowledge: Good  Language: Good  Assets:  Desire for Improvement Financial Resources/Insurance Housing Transportation  ADL's:  Intact  Cognition: WNL  Prognosis:  Fair    05/08/2020 GeneSight test results complete results can be found scanned in the chart under media. Wellbutrin is listed as no gene drug interaction to be used as directed.  Other meds listed to be used as directed include clomipramine, desipramine, [name], Cymbalta and Prozac both of which she has taken in the past, [name], Fetzima, mirtazapine, nortriptyline, trazodone, Effexor, Viibryd, and Trintellix. See Lexapro. Anxiolytics and hypnotics: Ativan is to be used as directed, and all in this category are in the use as directed category. 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   DIAGNOSES:    ICD-10-CM   1. Severe anxiety with panic  F41.0     2. Major depressive disorder, recurrent episode, moderate (HCC)  F33.1     3. 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  Last Ativan filled 08/27/2021 I provided 30  minutes of face to face time during this encounter, including time spent before and after the visit in records review, medical decision making, counseling pertinent to today's visit, and charting.  Long discussion about the anxiety. Adding an  SSRI could be very helpful. Has taken Lexapro and Prozac in the past, w/ unknown results. Zoloft came up in conversation and after discussing SE, benefits, etc, I recommend starting it, low dose and increase as tolerated, if needed.  Start Zoloft 25 mg, 1 po qd. Ok to increase over the phone in 2 weeks if tolerating ok and she would like to go up. Continue Wellbutrin XL 300 mg q am. Cont Ativan to 1 mg, 1 p.o. qid prn.  Cont Cerafolin NAC. Return in 2 months.Melony Overly, PA-C

## 2021-09-14 DIAGNOSIS — E7800 Pure hypercholesterolemia, unspecified: Secondary | ICD-10-CM | POA: Diagnosis not present

## 2021-10-05 ENCOUNTER — Other Ambulatory Visit: Payer: Self-pay | Admitting: Physician Assistant

## 2021-10-05 ENCOUNTER — Telehealth: Payer: Self-pay | Admitting: Physician Assistant

## 2021-10-05 MED ORDER — BUPROPION HCL ER (XL) 150 MG PO TB24: 150.0000 mg | ORAL_TABLET | Freq: Every day | ORAL | 1 refills | Status: DC

## 2021-10-05 NOTE — Telephone Encounter (Signed)
Pt started med on 1/27 and took for 3 weeks.She stopped a week ago.She stated she feels more depressed on med then without it.The note stated she could increase after 2 weeks if tolerated.No other side effects reported.Please advise

## 2021-10-05 NOTE — Telephone Encounter (Signed)
He stated anxiety is ok but not worse.He agrees with Wellbutrin being increased and she does have a lot of 300 mg left.They are using [facility] [address]

## 2021-10-05 NOTE — Telephone Encounter (Signed)
Prescription for Wellbutrin XL 150 mg sent in to [facility].

## 2021-10-05 NOTE — Telephone Encounter (Signed)
Pt's husband, [name] LVM.  He is on DPR.  He said that [name] is more depressed on the Sertraline and would like to know if there is another anti-depressant she can try.   Next appt. 4/27

## 2021-10-05 NOTE — Telephone Encounter (Signed)
How is she with the anxiety? If it was better on the Zoloft, recommend restarting at that dose, and increase to 50 mg in 2 weeks.  If the anxiety isn't a problem now, let's leave off the Zoloft, and increase the Wellbutrin XL to a total of 450 mg. It will help the depression but can sometimes cause the anxiety to worsen a little, at first. If we're increasing that, I need to know if they have a lot of the 300 mg, and I'll add a 150 mg pill, or if they don't have a lot, I'll send in the 150 mg pills and she'll take 3 pills every morning. Thanks.

## 2021-11-17 DIAGNOSIS — S76319A Strain of muscle, fascia and tendon of the posterior muscle group at thigh level, unspecified thigh, initial encounter: Secondary | ICD-10-CM | POA: Diagnosis not present

## 2021-11-17 DIAGNOSIS — R269 Unspecified abnormalities of gait and mobility: Secondary | ICD-10-CM | POA: Diagnosis not present

## 2021-11-17 DIAGNOSIS — M25552 Pain in left hip: Secondary | ICD-10-CM | POA: Diagnosis not present

## 2021-12-01 ENCOUNTER — Other Ambulatory Visit: Payer: Self-pay | Admitting: Physician Assistant

## 2021-12-06 ENCOUNTER — Encounter: Payer: Self-pay | Admitting: Physician Assistant

## 2021-12-06 ENCOUNTER — Ambulatory Visit (INDEPENDENT_AMBULATORY_CARE_PROVIDER_SITE_OTHER): Payer: Medicare HMO | Admitting: Physician Assistant

## 2021-12-06 DIAGNOSIS — F410 Panic disorder [episodic paroxysmal anxiety] without agoraphobia: Secondary | ICD-10-CM | POA: Diagnosis not present

## 2021-12-06 DIAGNOSIS — Z1589 Genetic susceptibility to other disease: Secondary | ICD-10-CM

## 2021-12-06 DIAGNOSIS — Z87898 Personal history of other specified conditions: Secondary | ICD-10-CM | POA: Diagnosis not present

## 2021-12-06 DIAGNOSIS — R69 Illness, unspecified: Secondary | ICD-10-CM | POA: Diagnosis not present

## 2021-12-06 DIAGNOSIS — F329 Major depressive disorder, single episode, unspecified: Secondary | ICD-10-CM | POA: Diagnosis not present

## 2021-12-06 DIAGNOSIS — R413 Other amnesia: Secondary | ICD-10-CM

## 2021-12-06 DIAGNOSIS — F411 Generalized anxiety disorder: Secondary | ICD-10-CM

## 2021-12-06 MED ORDER — DULOXETINE HCL 30 MG PO CPEP
30.0000 mg | ORAL_CAPSULE | Freq: Every day | ORAL | 1 refills | Status: DC
Start: 2021-12-06 — End: 2022-01-31

## 2021-12-06 NOTE — Progress Notes (Signed)
[facility] ? ?Patient ID: Courtney Beard,  ?MRN: [id number] ? ?PCP: Merri Brunette, MD ? ?Date of Evaluation: 12/06/2021  ?time spent:40 minutes ? ?Chief Complaint:  ?Chief Complaint   ?Anxiety; Depression; Follow-up ?  ? ? ?HISTORY/CURRENT STATUS:  ?HPI  For routine med check.  Husband, [name] with her.  ? ?Zoloft was prescribed at the last visit, unfortunately, it made her more depressed, then we increased Wellbutrin to 450 mg. On that for 2 months now. Hasn't helped at all. Feels a sense of gloom all the time, sleeps ok, feels rested when she gets up. Not isolating, still works out daily. Her son and DIL have moved to [facility], he is in residency at [employer] [employer] and drives back and forth. She wants to enjoy them and her grandkids, "I used to be so much fun."  Memory is still terrible.  Her husband shows her pictures a lot of places they have been to help jog her memory.  She tearfully says she is embarrassed because she forgets so much.  Her mother has dementia and does not remember who they are now, all she knows have to do is eat and that is it.  She does not speak anymore.  That is heartbreaking to, as patient is very worried she will be following down that path.  ADLs and personal hygiene are normal.  She does have occasional thoughts of "not wanting to be here."  But adamantly denies a plan and states she would never kill herself or anyone else. ? ?She still has a lot of anxiety which seems about the same, requiring 1 or 2 Ativan per day.  Only on 1 occasion last year she had to have 5 in 1 day which I agreed on because of the circumstances she was going through at the time.  But since then there have been some days that she has not needed 1 at all.  Sometimes she will feel tachycardic because she gets so anxious.  She will pace back and forth at times when it is really bad.  For the most part though it is a generalized sense of unease that can be overwhelming at times. ? ?Patient denies increased energy  with decreased need for sleep, no increased talkativeness, no racing thoughts, no impulsivity or risky behaviors, no increased spending, no increased libido, no grandiosity, no increased irritability or anger, and no hallucinations. ? ?Review of Systems  ?Constitutional: Negative.   ?HENT: Negative.    ?Eyes: Negative.   ?Respiratory: Negative.    ?Cardiovascular: Negative.   ?Gastrointestinal: Negative.   ?Genitourinary: Negative.   ?Musculoskeletal: Negative.   ?Skin: Negative.   ?Neurological:   ?     See HPI  ?Endo/Heme/Allergies: Negative.   ?Psychiatric/Behavioral:    ?     See HPI  ? ? ?Individual Medical History/ Review of Systems: Changes? :No   ? ?Past medications for mental health diagnoses include: ?Lexapro, Wellbutrin SR, Klonopin (which she OD'ed on.) Melatonin, prozac, Cymbalta, Namenda, BuSpar made her feel funny, Ativan, Aricept caused severe nightmares, [name] caused headaches, Lithium, Cerafolin NAC, [name] wasn't helpful, Trintellix ? ?Allergies: [name] [desvenlafaxine succinate er] and Erythromycin ? ?Current Medications:  ?Current Outpatient Medications:  ?  b complex vitamins capsule, Take 1 capsule by mouth daily., Disp: , Rfl:  ?  buPROPion (WELLBUTRIN XL) 150 MG 24 hr tablet, Take 1 tablet (150 mg total) by mouth daily. Take with the 300 mg pill to equal 450 mg daily., Disp: 90 tablet, Rfl: 1 ?  buPROPion (WELLBUTRIN XL) 300 MG 24 hr tablet, Take 1 tablet (300 mg total) by mouth daily., Disp: 90 tablet, Rfl: 1 ?  Coenzyme Q10 (CO Q10) 100 MG CAPS, Take by mouth., Disp: , Rfl:  ?  DULoxetine (CYMBALTA) 30 MG capsule, Take 1 capsule (30 mg total) by mouth daily., Disp: 30 capsule, Rfl: 1 ?  Ferrous Sulfate (IRON PO), Take 65 mg by mouth daily. 27 mg 4 times per week, Disp: , Rfl:  ?  Grape Seed Extract 100 MG CAPS, Take 4 capsules by mouth., Disp: , Rfl:  ?  LORazepam (ATIVAN) 1 MG tablet, TAKE ONE TABLET BY MOUTH EVERY 6 HOURS AS NEEDED FOR ANXIETY, Disp: 120 tablet, Rfl: 0 ?   Methylfol-Methylcob-Acetylcyst (CEREFOLIN NAC) 6-2-600 MG TABS, Take 1 tablet by mouth daily., Disp: 30 tablet, Rfl: 11 ?  Omega-3 Fatty Acids (FISH OIL MAXIMUM STRENGTH) 1200 MG CPDR, Take 1,200 capsules by mouth 2 (two) times daily. , Disp: , Rfl:  ?  Specialty Vitamins Products (MAGNESIUM, AMINO ACID CHELATE,) 133 MG tablet, Take 1 tablet by mouth in the morning., Disp: , Rfl:  ?  UNABLE TO FIND, Med Name: tumeric 500  Mg daily, Disp: , Rfl:  ?  acetaminophen (TYLENOL) 325 MG tablet, Take 2 tablets (650 mg total) by mouth every 6 (six) hours as needed for fever., Disp: , Rfl:  ?  Multiple Vitamins-Minerals (MULTI-DAY PLUS MINERALS PO), Take 1 tablet by mouth daily. (Patient not taking: Reported on 01/31/2020), Disp: , Rfl:  ?  Specialty Vitamins Products (COLLAGEN ULTRA) CAPS, Take by mouth. (Patient not taking: Reported on 01/31/2020), Disp: , Rfl:  ?Medication Side Effects: none ? ?Family Medical/ Social History: Changes? no ? ?MENTAL HEALTH EXAM: ? ?There were no vitals taken for this visit.There is no height or weight on file to calculate BMI.  ?General Appearance: Casual, Neat and Well Groomed  ?Eye Contact:  Good  ?Speech:  Clear and Coherent and Normal Rate  ?Volume:  Normal  ?Mood:  Anxious  ?Affect:  Tearful and Anxious  ?Thought Process:  Goal Directed and Descriptions of Associations: Circumstantial  ?Orientation:  Full (Time, Place, and Person)  ?Thought Content:  See HPI    ?Suicidal Thoughts:  Yes.  without intent/plan  ?Homicidal Thoughts:  No  ?Memory:  Immediate;   Poor ?Recent;   Poor ?Remote;   Fair  ?Judgement:  Fair  ?Insight:  Good  ?Psychomotor Activity:  Normal  ?Concentration:  Concentration: Fair and Attention Span: Fair  ?Recall:  Good  ?Fund of Knowledge: Good  ?Language: Good  ?Assets:  Desire for Improvement ?Financial Resources/Insurance ?Housing ?Transportation  ?ADL's:  Intact  ?Cognition: WNL  ?Prognosis:  Fair  ? ? ?05/08/2020 ?GeneSight test results complete results can be found  scanned in the chart under media. ? ?Wellbutrin is listed as no gene drug interaction to be used as directed.  Other meds listed to be used as directed include clomipramine, desipramine, [name], Cymbalta and Prozac both of which she has taken in the past, [name], Fetzima, mirtazapine, nortriptyline, trazodone, Effexor, Viibryd, and Trintellix. See Lexapro. ?Anxiolytics and hypnotics: ?Ativan is to be used as directed, and all in this category are in the use as directed category. 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 ? 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  ? ?SAW [place] NEURO FOR NEUROPSYCH EVAL BY DR. Earna CoderZACHARY MERZ 10/25/2019 ? ?DIAGNOSES:  ?  ICD-10-CM   ?1. Treatment-resistant  depression  F32.9   ?  ?2. Severe anxiety with panic  F41.0   ?  ?3. Memory loss  R41.3   ?  ?4. Generalized anxiety disorder  F41.1   ?  ?5. 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  ?6. History of alcohol use disorder  Z87.898   ?  ? ?Receiving Psychotherapy: No  ? ? ?RECOMMENDATIONS:  ?PDMP was reviewed.  Last Ativan filled 10/18/2021.  ?I provided 40 minutes of face to face time during this encounter, including time spent before and after the visit in records review, medical decision making, counseling pertinent to today's visit, and charting.  ?Again had a long discussion about the depression and anxiety.  [name] brought up that she had been on Cymbalta in the past, although they cannot really remember how effective it was he just remembers that [name] was upset when her provider took her off of it.  At the time there were concerns of unwanted side effects and the provider was not comfortable prescribing it.  We agreed that it could be a good choice to retry.  I recommend starting at a very low dose and staying on that for 1 month and then reevaluate. ?We again discussed other options including TMS, ECT, and Spravato.  [name] is familiar with TMS and ECT and we discussed the pros  and cons of each.  One of his sons is a physician and the other one is a Advice worker so he will discuss those options with them.  Neither of them were familiar with Spravato, I explained what it is, how the

## 2021-12-14 ENCOUNTER — Other Ambulatory Visit: Payer: Self-pay | Admitting: Physician Assistant

## 2022-01-03 ENCOUNTER — Ambulatory Visit (INDEPENDENT_AMBULATORY_CARE_PROVIDER_SITE_OTHER): Payer: Medicare HMO | Admitting: Physician Assistant

## 2022-01-03 ENCOUNTER — Encounter: Payer: Self-pay | Admitting: Physician Assistant

## 2022-01-03 DIAGNOSIS — R69 Illness, unspecified: Secondary | ICD-10-CM | POA: Diagnosis not present

## 2022-01-03 DIAGNOSIS — F329 Major depressive disorder, single episode, unspecified: Secondary | ICD-10-CM | POA: Diagnosis not present

## 2022-01-03 DIAGNOSIS — Z820 Family history of epilepsy and other diseases of the nervous system: Secondary | ICD-10-CM

## 2022-01-03 DIAGNOSIS — R413 Other amnesia: Secondary | ICD-10-CM

## 2022-01-03 DIAGNOSIS — Z1589 Genetic susceptibility to other disease: Secondary | ICD-10-CM

## 2022-01-03 DIAGNOSIS — F410 Panic disorder [episodic paroxysmal anxiety] without agoraphobia: Secondary | ICD-10-CM

## 2022-01-03 NOTE — Progress Notes (Signed)
[facility]  Patient ID: Courtney Beard,  MRN: [id number]  PCP: Courtney Ada, MD  Date of Evaluation: 01/03/2022  time spent:30 minutes  Chief Complaint:  Chief Complaint   Anxiety; Depression; Insomnia; Follow-up     HISTORY/CURRENT STATUS:  HPI  For routine med check.  Husband, Courtney Beard with her.   Cymbalta was added a month ago, feels a little better as far as depression goes. Still very anxious, for no known reason, she will get panicky and begged her husband to hold her.  That is the only thing that comforts her.  The Ativan does help and she is taking it 3-4 times every day now.  He is still very concerned about her memory, her mom has Alzheimer's and Courtney Beard has tested and has genetic predisposition for it.  She has no memory of trips or family get-togethers, has to be told repeatedly about things.  Her husband will often show her pictures of places they have traveled.  That is disturbing in itself, it upsets her that she cannot remember those things.  She still enjoys going to the gym every day.  She takes several classes and is very happy with the camaraderie she has there.  ADLs and personal hygiene are normal.  She does cry easily at times when she is overwhelmed with the anxiety.  Appetite is normal and weight is stable. Sleeps ok. No suicidal or homicidal thoughts.  Patient denies increased energy with decreased need for sleep, no increased talkativeness, no racing thoughts, no impulsivity or risky behaviors, no increased spending, no increased libido, no grandiosity, no increased irritability or anger, no paranoia, and no hallucinations.  Denies dizziness, syncope, seizures, numbness, tingling, tremor, tics, unsteady gait, slurred speech, confusion. Denies muscle or joint pain, stiffness, or dystonia.  Individual Medical History/ Review of Systems: Changes? :No    Past medications for mental health diagnoses include: Lexapro, Wellbutrin SR, Klonopin (which  she OD'ed on.) Melatonin, prozac, Cymbalta, Namenda, BuSpar made her feel funny, Ativan, Aricept caused severe nightmares, [name] caused headaches, Lithium, Cerafolin NAC, [name] wasn't helpful, Trintellix  Allergies: [name] [desvenlafaxine succinate er] and Erythromycin  Current Medications:  Current Outpatient Medications:    acetaminophen (TYLENOL) 325 MG tablet, Take 2 tablets (650 mg total) by mouth every 6 (six) hours as needed for fever., Disp: , Rfl:    b complex vitamins capsule, Take 1 capsule by mouth daily., Disp: , Rfl:    buPROPion (WELLBUTRIN XL) 150 MG 24 hr tablet, Take 1 tablet (150 mg total) by mouth daily. Take with the 300 mg pill to equal 450 mg daily., Disp: 90 tablet, Rfl: 1   buPROPion (WELLBUTRIN XL) 300 MG 24 hr tablet, TAKE ONE TABLET BY MOUTH DAILY, Disp: 90 tablet, Rfl: 0   Coenzyme Q10 (CO Q10) 100 MG CAPS, Take by mouth., Disp: , Rfl:    DULoxetine (CYMBALTA) 30 MG capsule, Take 1 capsule (30 mg total) by mouth daily., Disp: 30 capsule, Rfl: 1   Ferrous Sulfate (IRON PO), Take 65 mg by mouth daily. 27 mg 4 times per week, Disp: , Rfl:    Grape Seed Extract 100 MG CAPS, Take 4 capsules by mouth., Disp: , Rfl:    LORazepam (ATIVAN) 1 MG tablet, TAKE ONE TABLET BY MOUTH EVERY 6 HOURS AS NEEDED FOR ANXIETY, Disp: 120 tablet, Rfl: 0   Omega-3 Fatty Acids (FISH OIL MAXIMUM STRENGTH) 1200 MG CPDR, Take 1,200 capsules by mouth 2 (two) times daily. , Disp: , Rfl:  Specialty Vitamins Products (COLLAGEN ULTRA) CAPS, Take by mouth., Disp: , Rfl:    Specialty Vitamins Products (MAGNESIUM, AMINO ACID CHELATE,) 133 MG tablet, Take 1 tablet by mouth in the morning., Disp: , Rfl:    UNABLE TO FIND, Med Name: tumeric 500  Mg daily, Disp: , Rfl:    Multiple Vitamins-Minerals (MULTI-DAY PLUS MINERALS PO), Take 1 tablet by mouth daily. (Patient not taking: Reported on 01/31/2020), Disp: , Rfl:  Medication Side Effects: none  Family Medical/ Social History: Changes? no  MENTAL  HEALTH EXAM:  There were no vitals taken for this visit.There is no height or weight on file to calculate BMI.  General Appearance: Casual, Neat and Well Groomed  Eye Contact:  Good  Speech:  Clear and Coherent and Normal Rate  Volume:  Normal  Mood:  Anxious  Affect:  Congruent  Thought Process:  Goal Directed and Descriptions of Associations: Circumstantial  Orientation:  Full (Time, Place, and Person)  Thought Content:  See HPI    Suicidal Thoughts:  Yes.  without intent/plan  Homicidal Thoughts:  No  Memory:  Immediate;   Poor Recent;   Poor Remote;   Fair  Judgement:  Fair  Insight:  Good  Psychomotor Activity:  Normal  Concentration:  Concentration: Fair and Attention Span: Fair  Recall:  Good  Fund of Knowledge: Good  Language: Good  Assets:  Desire for Improvement Financial Resources/Insurance Housing Transportation  ADL's:  Intact  Cognition: WNL  Prognosis:  Fair    05/08/2020 GeneSight test results complete results can be found scanned in the chart under media.  Wellbutrin is listed as no gene drug interaction to be used as directed.  Other meds listed to be used as directed include clomipramine, desipramine, [name], Cymbalta and Prozac both of which she has taken in the past, [name], Fetzima, mirtazapine, nortriptyline, trazodone, Effexor, Viibryd, and Trintellix. See Lexapro. Anxiolytics and hypnotics: Ativan is to be used as directed, and all in this category are in the use as directed category. 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   SAW Courtney Beard NEURO FOR NEUROPSYCH EVAL BY DR. Alroy Beard MERZ 10/25/2019  DIAGNOSES:    ICD-10-CM   1. Severe anxiety with panic  F41.0     2. Treatment-resistant depression  F32.9     3. 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 Memory loss  R41.3     5. Family history of Alzheimer's disease  Z82.0       Receiving  Psychotherapy: No    RECOMMENDATIONS:  PDMP was reviewed.  Last Ativan filled 12/03/2021. I provided 30  minutes of face to face time during this encounter, including time spent before and after the visit in records review, medical decision making, counseling pertinent to today's visit, and charting.  We discussed the neuropsychological testing that she had done 2 years ago by Dr. Hazle Coca.  His recommendation was to repeat the test within 18 to 24 months.  Recommend they call his office to set up an appointment but if a referral is needed let me know. Courtney Beard has done a lot of research concerning neurotransmitter levels and how they affect mood.  He asks about specific testing for the different types of neurotransmitters, at present it is not widely available to test, recommend he seek the help of his son who is a resident at [facility], [place]  be they can find a clinical trial of some sort that could be helpful. In the meantime no changes will be made.  Continue Wellbutrin XL 450 mg q am.   Continue Cymbalta 30 mg daily. Cont Ativan to 1 mg, 1 p.o. qid prn.  Cont B complex, CoQ 10, grape seed extract, omega-3, turmeric, magnesium, L-carnitine, chelated iron, and Cognizin Citicoline.  Add a multivitamin back in. Needs to be in therapy. Return in 4 wks.   Donnal Moat, PA-C

## 2022-01-13 ENCOUNTER — Other Ambulatory Visit: Payer: Self-pay | Admitting: Physician Assistant

## 2022-01-14 NOTE — Telephone Encounter (Signed)
Filled 4/24

## 2022-01-31 ENCOUNTER — Other Ambulatory Visit: Payer: Self-pay | Admitting: Physician Assistant

## 2022-02-07 ENCOUNTER — Ambulatory Visit (INDEPENDENT_AMBULATORY_CARE_PROVIDER_SITE_OTHER): Payer: Medicare HMO | Admitting: Physician Assistant

## 2022-02-07 ENCOUNTER — Encounter: Payer: Self-pay | Admitting: Physician Assistant

## 2022-02-07 DIAGNOSIS — F329 Major depressive disorder, single episode, unspecified: Secondary | ICD-10-CM

## 2022-02-07 DIAGNOSIS — Z820 Family history of epilepsy and other diseases of the nervous system: Secondary | ICD-10-CM | POA: Diagnosis not present

## 2022-02-07 DIAGNOSIS — R69 Illness, unspecified: Secondary | ICD-10-CM | POA: Diagnosis not present

## 2022-02-07 DIAGNOSIS — F410 Panic disorder [episodic paroxysmal anxiety] without agoraphobia: Secondary | ICD-10-CM | POA: Diagnosis not present

## 2022-02-07 DIAGNOSIS — R413 Other amnesia: Secondary | ICD-10-CM | POA: Diagnosis not present

## 2022-02-07 DIAGNOSIS — Z1589 Genetic susceptibility to other disease: Secondary | ICD-10-CM | POA: Diagnosis not present

## 2022-02-07 MED ORDER — BUSPIRONE HCL 15 MG PO TABS
ORAL_TABLET | ORAL | 0 refills | Status: DC
Start: 2022-02-07 — End: 2022-03-07

## 2022-02-07 MED ORDER — ALPRAZOLAM 0.5 MG PO TABS: 0.5000 mg | ORAL_TABLET | Freq: Four times a day (QID) | ORAL | 0 refills | Status: DC

## 2022-02-07 NOTE — Progress Notes (Signed)
[facility]  Patient ID: Courtney Beard,  MRN: [id number]  PCP: Merri Brunette, MD  Date of Evaluation: 02/07/2022 time spent:20 minutes  Chief Complaint:  Chief Complaint   Memory Loss; Anxiety; Depression; Follow-up    HISTORY/CURRENT STATUS:  HPI  For routine med check.  Husband, [name] with her.   She feels like the memory is getting worse. [name] feels like it's about the same. She states the anxiety is worse. Is taking the Ativan at least 3 times every day and sometimes 4.  Feels that the anxiety happens more when she forgets things. It's different than someone just going into a room and not being able to remember what she went for.  She knows that she does not remember things and gets very aggravated.  Her husband will tell her things several times and he sometimes gets aggravated with her because she does not remember.  He is usually very patient though.  He has learned to talk about important things earlier in the day when she is fresher and he is not tired.  She still enjoys going to the gym every day.  She takes several classes and is very happy with the camaraderie she has there.  ADLs and personal hygiene are normal.  She does cry easily at times when she is overwhelmed with the anxiety.  Appetite is normal and weight is stable. Sleeps ok. No suicidal or homicidal thoughts.  Patient denies increased energy with decreased need for sleep, no increased talkativeness, no racing thoughts, no impulsivity or risky behaviors, no increased spending, no increased libido, no grandiosity, no increased irritability or anger, no paranoia, and no hallucinations.  [name] called and got an appointment with Dr. Rosann Auerbach for repeat psychological testing.  The appointment is several months away.  Denies dizziness, syncope, seizures, numbness, tingling, tremor, tics, unsteady gait, slurred speech, confusion. Denies muscle or joint pain, stiffness, or dystonia.  Individual  Medical History/ Review of Systems: Changes? :No    Past medications for mental health diagnoses include: Lexapro, Wellbutrin SR, Klonopin (which she OD'ed on.) Melatonin, prozac, Cymbalta, Namenda BuSpar made her feel funny, Ativan, Aricept caused severe nightmares, [name] caused headaches, Lithium, Cerafolin NAC, [name] wasn't helpful, Trintellix  Allergies: [name] [desvenlafaxine succinate er] and Erythromycin  Current Medications:  Current Outpatient Medications:    acetaminophen (TYLENOL) 325 MG tablet, Take 2 tablets (650 mg total) by mouth every 6 (six) hours as needed for fever., Disp: , Rfl:    ALPRAZolam (XANAX) 0.5 MG tablet, Take 1 tablet (0.5 mg total) by mouth 4 (four) times daily., Disp: 120 tablet, Rfl: 0   b complex vitamins capsule, Take 1 capsule by mouth daily., Disp: , Rfl:    buPROPion (WELLBUTRIN XL) 150 MG 24 hr tablet, Take 1 tablet (150 mg total) by mouth daily. Take with the 300 mg pill to equal 450 mg daily., Disp: 90 tablet, Rfl: 1   buPROPion (WELLBUTRIN XL) 300 MG 24 hr tablet, TAKE ONE TABLET BY MOUTH DAILY, Disp: 90 tablet, Rfl: 0   busPIRone (BUSPAR) 15 MG tablet, 1/3 tablet twice daily for 1 week, then increase to 2/3 tablet twice daily for 1 week, then increase to 1 tablet twice daily for anxiety., Disp: 60 tablet, Rfl: 0   Coenzyme Q10 (CO Q10) 100 MG CAPS, Take by mouth., Disp: , Rfl:    DULoxetine (CYMBALTA) 30 MG capsule, TAKE ONE CAPSULE BY MOUTH DAILY, Disp: 30 capsule, Rfl: 0   Ferrous Sulfate (IRON PO), Take 65 mg  by mouth daily. 27 mg 4 times per week, Disp: , Rfl:    Grape Seed Extract 100 MG CAPS, Take 4 capsules by mouth., Disp: , Rfl:    LORazepam (ATIVAN) 1 MG tablet, TAKE ONE TABLET BY MOUTH EVERY 6 HOURS AS NEEDED FOR ANXIETY, Disp: 120 tablet, Rfl: 0   Multiple Vitamins-Minerals (MULTI-DAY PLUS MINERALS PO), Take 1 tablet by mouth daily., Disp: , Rfl:    Omega-3 Fatty Acids (FISH OIL MAXIMUM STRENGTH) 1200 MG CPDR, Take 1,200 capsules by  mouth 2 (two) times daily. , Disp: , Rfl:    Specialty Vitamins Products (COLLAGEN ULTRA) CAPS, Take by mouth., Disp: , Rfl:    Specialty Vitamins Products (MAGNESIUM, AMINO ACID CHELATE,) 133 MG tablet, Take 1 tablet by mouth in the morning., Disp: , Rfl:    UNABLE TO FIND, Med Name: tumeric 500  Mg daily, Disp: , Rfl:    UNABLE TO FIND, Huperizine, Disp: , Rfl:  Medication Side Effects: none  Family Medical/ Social History: Changes? no  MENTAL HEALTH EXAM:  There were no vitals taken for this visit.There is no height or weight on file to calculate BMI.  General Appearance: Casual, Neat and Well Groomed  Eye Contact:  Good  Speech:  Clear and Coherent and Normal Rate  Volume:  Normal  Mood:  Anxious  Affect:  Anxious  Thought Process:  Goal Directed and Descriptions of Associations: Circumstantial  Orientation:  Full (Time, Place, and Person)  Thought Content:  See HPI    Suicidal Thoughts:  No  Homicidal Thoughts:  No  Memory:  Immediate;   Poor Recent;   Poor Remote;   Fair  Judgement:  Fair  Insight:  Good  Psychomotor Activity:  Normal  Concentration:  Concentration: Fair and Attention Span: Fair  Recall:  Good  Fund of Knowledge: Good  Language: Good  Assets:  Desire for Improvement Financial Resources/Insurance Housing Transportation  ADL's:  Intact  Cognition: WNL  Prognosis:  Fair    05/08/2020 GeneSight test results complete results can be found scanned in the chart under media.  Wellbutrin is listed as no gene drug interaction to be used as directed.  Other meds listed to be used as directed include clomipramine, desipramine, [name], Cymbalta and Prozac both of which she has taken in the past, [name], Fetzima, mirtazapine, nortriptyline, trazodone, Effexor, Viibryd, and Trintellix. See Lexapro. Anxiolytics and hypnotics: Ativan is to be used as directed, and all in this category are in the use as directed category. She has abnormal MTHFR gene which causes  reduced folic acid conversion.  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   SAW [place] NEURO FOR NEUROPSYCH EVAL BY DR. Alroy Dust MERZ 10/25/2019  DIAGNOSES:    ICD-10-CM   1. Severe anxiety with panic  F41.0     2. Memory loss  R41.3     3. Family history of Alzheimer's disease  Z82.0     4. 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 Treatment-resistant depression  F32.9      Receiving Psychotherapy: No   RECOMMENDATIONS:  PDMP was reviewed.  Last Ativan filled 01/14/2022. I provided 20 minutes of face to face time during this encounter, including time spent before and after the visit in records review, medical decision making, counseling pertinent to today's visit, and charting.  She will be seeing Dr. Hazle Coca for repeat neuropsychological testing in October I believe.  As far as her memory issues  go it is difficult to treat as she did not tolerate Aricept or Namenda.  Her husband has been giving her numerous vitamins and supplements hoping to prevent her memory from worsening. We discussed the anxiety.  She was on BuSpar at one point but reported that "it made her feel funny."  She or her husband do not remember anything about it.  I recommend retrying it.  They would like to do so.  Benefits, risks and side effects were discussed and she accepts. The Ativan is not working any longer so recommend changing to Xanax.  Her husband will be in charge of it. I do not typically do 2 things at once but in her case feel like it will be more beneficial than waiting a few weeks or a month to do one or the other.  They know to call if she has any problems. Will discuss Cerafolin NAC again at next OV.   Discontinue Ativan. Start Xanax 0.5 mg, 1 p.o. 4 times daily as needed.  She needs to take it 3 times daily for sure since she has been taking the Ativan 3 times daily to 4 times daily. Continue Wellbutrin XL 450 mg q am.   Start BuSpar 15 mg 1/3 tablet twice daily for 1  week, then increase to 2/3 tablet twice daily for 1 week, then increase to 1 tablet twice daily for anxiety. Continue Cymbalta 30 mg daily. Cont B complex, CoQ 10, grape seed extract, omega-3, turmeric, magnesium, L-carnitine, chelated iron, and Cognizin Citicoline, Huperizine, and multivitamin. Return in 4-6 wks.   Melony Overly, PA-C

## 2022-02-11 ENCOUNTER — Telehealth: Payer: Self-pay | Admitting: Physician Assistant

## 2022-02-11 ENCOUNTER — Other Ambulatory Visit: Payer: Self-pay | Admitting: Family Medicine

## 2022-02-11 DIAGNOSIS — Z1231 Encounter for screening mammogram for malignant neoplasm of breast: Secondary | ICD-10-CM

## 2022-02-11 NOTE — Telephone Encounter (Signed)
Pt's husband LVM @ 4:09p.  He says he needs Rosey Bath to send the referral for the cognitive testing to Dr. Milbert Coulter office.  They will then call him for an appt.   Next appt 8/3

## 2022-02-13 ENCOUNTER — Telehealth: Payer: Self-pay | Admitting: Physician Assistant

## 2022-02-13 ENCOUNTER — Ambulatory Visit
Admission: RE | Admit: 2022-02-13 | Discharge: 2022-02-13 | Disposition: A | Discharge status: Home / Self Care | Payer: Medicare HMO | Source: Ambulatory Visit | Attending: Family Medicine | Admitting: Family Medicine

## 2022-02-13 DIAGNOSIS — Z1231 Encounter for screening mammogram for malignant neoplasm of breast: Secondary | ICD-10-CM | POA: Diagnosis not present

## 2022-02-13 NOTE — Telephone Encounter (Signed)
Yes, stopping the Buspar was the right thing to do. No other changes at this time. Thanks.

## 2022-02-13 NOTE — Telephone Encounter (Signed)
Husband notified  

## 2022-02-13 NOTE — Telephone Encounter (Signed)
Please see message from husband and my note about previous BuSpar use.  Husband said that they stopped the BuSpar 3-4 days ago and patient's anger has improved some. He wonders if the duloxetine and bupropion were not playing nice with Xanax and BuSpar.  Husband is not asking for another medication, but if someone needs to be sent, send to [facility] on [address].

## 2022-02-13 NOTE — Telephone Encounter (Signed)
LVM to [facility]. Per 06/19/20 note BuSpar was discontinued because it made her feel funny.

## 2022-02-13 NOTE — Telephone Encounter (Signed)
Please send referral to Dr. Rosann Auerbach at [facility] neurology for repeat neuropsychological testing.  She saw him a few years ago and it was recommended for her to return for repeat testing.  My last note is ready.  Thank you.

## 2022-02-13 NOTE — Telephone Encounter (Signed)
Husband called and said that Courtney Beard has been taking two new meds. Buspar and xanax. She is more irritable and angry and feels tired. She feels beat up and uncomfortable while she is lying down. She cut the xanax in half . Husband is wondering if the cocktail of meds don't mix well. He said that in the past betty was on buspar and had  problem with it. Please call husband at [phone] or after 5:30 at [phone]

## 2022-02-14 ENCOUNTER — Encounter: Payer: Self-pay | Admitting: Psychology

## 2022-02-14 NOTE — Telephone Encounter (Signed)
Referral has been sent to Dr. Milbert Coulter at [facility].

## 2022-02-15 ENCOUNTER — Other Ambulatory Visit: Payer: Self-pay | Admitting: Family Medicine

## 2022-02-15 DIAGNOSIS — R928 Other abnormal and inconclusive findings on diagnostic imaging of breast: Secondary | ICD-10-CM

## 2022-02-22 ENCOUNTER — Ambulatory Visit
Admission: RE | Admit: 2022-02-22 | Discharge: 2022-02-22 | Disposition: A | Discharge status: Home / Self Care | Payer: Medicare HMO | Source: Ambulatory Visit | Attending: Family Medicine | Admitting: Family Medicine

## 2022-02-22 ENCOUNTER — Ambulatory Visit: Payer: Medicare HMO

## 2022-02-22 DIAGNOSIS — R928 Other abnormal and inconclusive findings on diagnostic imaging of breast: Secondary | ICD-10-CM

## 2022-02-22 DIAGNOSIS — R922 Inconclusive mammogram: Secondary | ICD-10-CM | POA: Diagnosis not present

## 2022-02-27 ENCOUNTER — Other Ambulatory Visit: Payer: Self-pay | Admitting: Physician Assistant

## 2022-03-05 DIAGNOSIS — M62838 Other muscle spasm: Secondary | ICD-10-CM | POA: Diagnosis not present

## 2022-03-05 DIAGNOSIS — M542 Cervicalgia: Secondary | ICD-10-CM | POA: Diagnosis not present

## 2022-03-07 ENCOUNTER — Ambulatory Visit: Payer: Medicare HMO | Admitting: Physician Assistant

## 2022-03-07 ENCOUNTER — Other Ambulatory Visit: Payer: Self-pay | Admitting: Physician Assistant

## 2022-03-07 DIAGNOSIS — M542 Cervicalgia: Secondary | ICD-10-CM | POA: Diagnosis not present

## 2022-03-11 ENCOUNTER — Other Ambulatory Visit: Payer: Self-pay | Admitting: Physician Assistant

## 2022-03-11 DIAGNOSIS — M542 Cervicalgia: Secondary | ICD-10-CM | POA: Diagnosis not present

## 2022-03-13 ENCOUNTER — Other Ambulatory Visit: Payer: Self-pay | Admitting: Physician Assistant

## 2022-03-14 ENCOUNTER — Encounter: Payer: Self-pay | Admitting: Physician Assistant

## 2022-03-14 ENCOUNTER — Ambulatory Visit (INDEPENDENT_AMBULATORY_CARE_PROVIDER_SITE_OTHER): Payer: Medicare HMO | Admitting: Physician Assistant

## 2022-03-14 DIAGNOSIS — F329 Major depressive disorder, single episode, unspecified: Secondary | ICD-10-CM | POA: Diagnosis not present

## 2022-03-14 DIAGNOSIS — R69 Illness, unspecified: Secondary | ICD-10-CM | POA: Diagnosis not present

## 2022-03-14 DIAGNOSIS — F410 Panic disorder [episodic paroxysmal anxiety] without agoraphobia: Secondary | ICD-10-CM | POA: Diagnosis not present

## 2022-03-14 DIAGNOSIS — Z87898 Personal history of other specified conditions: Secondary | ICD-10-CM | POA: Diagnosis not present

## 2022-03-14 DIAGNOSIS — Z820 Family history of epilepsy and other diseases of the nervous system: Secondary | ICD-10-CM

## 2022-03-14 DIAGNOSIS — Z1589 Genetic susceptibility to other disease: Secondary | ICD-10-CM | POA: Diagnosis not present

## 2022-03-14 DIAGNOSIS — R413 Other amnesia: Secondary | ICD-10-CM | POA: Diagnosis not present

## 2022-03-14 MED ORDER — LORAZEPAM 1 MG PO TABS: 1.0000 mg | ORAL_TABLET | Freq: Four times a day (QID) | ORAL | 2 refills | Status: DC | PRN

## 2022-03-14 MED ORDER — DULOXETINE HCL 30 MG PO CPEP
30.0000 mg | ORAL_CAPSULE | Freq: Every day | ORAL | 3 refills | Status: DC
Start: 2022-03-14 — End: 2022-05-17

## 2022-03-14 NOTE — Progress Notes (Signed)
[facility]  Patient ID: ELISHIA KACZOROWSKI,  MRN: [id number]  PCP: Merri Brunette, MD  Date of Evaluation: 03/14/2022 time spent:20 minutes  Chief Complaint:  Chief Complaint   Anxiety; Depression; Follow-up    HISTORY/CURRENT STATUS:  HPI  For routine med check.  Husband, [name] with her.   Didn't tolerate the Buspar. This was second time we tried it, it 'made her feel funny.' [name] didn't give her the Xanax but for a few days.  It seemed to be too strong.  He had leftover Ativan so used that instead.  He decreased the Wellbutrin, thinking that may have been too much for her, went from 450 mg to 300 mg and patient states she feels a lot calmer.  She is usually only needing the Ativan twice a day, occasionally 3 times a day.  Not having panic attacks like she had been.  Patient is able to enjoy things.  Energy and motivation are good.  Still goes to the gym daily and loves it.  "I cannot do without that."  No extreme sadness, tearfulness, or feelings of hopelessness.  Sleeps well most of the time. ADLs and personal hygiene are normal.  Appetite has not changed.  Weight is stable.  Memory is the same.  Denies suicidal or homicidal thoughts.  Denies dizziness, syncope, seizures, numbness, tingling, tremor, tics, unsteady gait, slurred speech, confusion. Denies muscle or joint pain, stiffness, or dystonia.  Individual Medical History/ Review of Systems: Changes? :No    Past medications for mental health diagnoses include: Lexapro, Wellbutrin SR, Klonopin (which she OD'ed on.) Melatonin, prozac, Cymbalta, Namenda BuSpar made her feel funny, Ativan, Aricept caused severe nightmares, [name] caused headaches, Lithium, Cerafolin NAC, [name] wasn't helpful, Trintellix  Allergies: [name] [desvenlafaxine succinate er] and Erythromycin  Current Medications:  Current Outpatient Medications:    acetaminophen (TYLENOL) 325 MG tablet, Take 2 tablets (650 mg total) by mouth every 6  (six) hours as needed for fever., Disp: , Rfl:    b complex vitamins capsule, Take 1 capsule by mouth daily., Disp: , Rfl:    buPROPion (WELLBUTRIN XL) 300 MG 24 hr tablet, TAKE ONE TABLET BY MOUTH DAILY, Disp: 90 tablet, Rfl: 0   Coenzyme Q10 (CO Q10) 100 MG CAPS, Take by mouth., Disp: , Rfl:    DULoxetine (CYMBALTA) 30 MG capsule, Take 1 capsule (30 mg total) by mouth daily., Disp: 90 capsule, Rfl: 3   Ferrous Sulfate (IRON PO), Take 65 mg by mouth daily. 27 mg 4 times per week, Disp: , Rfl:    Grape Seed Extract 100 MG CAPS, Take 4 capsules by mouth., Disp: , Rfl:    LORazepam (ATIVAN) 1 MG tablet, Take 1 tablet (1 mg total) by mouth every 6 (six) hours as needed. for anxiety, Disp: 120 tablet, Rfl: 2   Multiple Vitamins-Minerals (MULTI-DAY PLUS MINERALS PO), Take 1 tablet by mouth daily., Disp: , Rfl:    Omega-3 Fatty Acids (FISH OIL MAXIMUM STRENGTH) 1200 MG CPDR, Take 1,200 capsules by mouth 2 (two) times daily. , Disp: , Rfl:    Specialty Vitamins Products (COLLAGEN ULTRA) CAPS, Take by mouth., Disp: , Rfl:    Specialty Vitamins Products (MAGNESIUM, AMINO ACID CHELATE,) 133 MG tablet, Take 1 tablet by mouth in the morning., Disp: , Rfl:    UNABLE TO FIND, Med Name: tumeric 500  Mg daily, Disp: , Rfl:    UNABLE TO FIND, Huperizine (Patient not taking: Reported on 03/14/2022), Disp: , Rfl:  Medication Side Effects: none  Family Medical/ Social History: Changes? no  MENTAL HEALTH EXAM:  There were no vitals taken for this visit.There is no height or weight on file to calculate BMI.  General Appearance: Casual, Neat and Well Groomed  Eye Contact:  Good  Speech:  Clear and Coherent and Normal Rate  Volume:  Normal  Mood:  Euthymic  Affect:  Congruent  Thought Process:  Goal Directed and Descriptions of Associations: Circumstantial  Orientation:  Full (Time, Place, and Person)  Thought Content:  See HPI    Suicidal Thoughts:  No  Homicidal Thoughts:  No  Memory:  Immediate;    Poor Recent;   Poor Remote;   Fair  Judgement:  Fair  Insight:  Good  Psychomotor Activity:  Normal  Concentration:  Concentration: Fair and Attention Span: Fair  Recall:  Good  Fund of Knowledge: Good  Language: Good  Assets:  Desire for Improvement Financial Resources/Insurance Housing Transportation  ADL's:  Intact  Cognition: WNL  Prognosis:  Fair    05/08/2020 GeneSight test results complete results can be found scanned in the chart under media.  Wellbutrin is listed as no gene drug interaction to be used as directed.  Other meds listed to be used as directed include clomipramine, desipramine, [name], Cymbalta and Prozac both of which she has taken in the past, [name], Fetzima, mirtazapine, nortriptyline, trazodone, Effexor, Viibryd, and Trintellix. See Lexapro. Anxiolytics and hypnotics: Ativan is to be used as directed, and all in this category are in the use as directed category. 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   [facility] [place] [facility] FOR NEUROPSYCH EVAL BY DR. Earna Coder MERZ 10/25/2019  DIAGNOSES:    ICD-10-CM   1. Severe anxiety with panic  F41.0     2. Memory loss  R41.3     3. 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 Family history of Alzheimer's disease  Z82.0     5. Treatment-resistant depression  F32.9     6. History of alcohol use disorder  Z87.898       Receiving Psychotherapy: No   RECOMMENDATIONS:  PDMP was reviewed.  Last Xanax filled 02/07/2022. I provided 20 minutes of face to face time during this encounter, including time spent before and after the visit in records review, medical decision making, counseling pertinent to today's visit, and charting.   I told him to keep the Xanax that he has left over, keep it in the cabinet in case she uses it in the future.  They know not to give it to her with the Ativan.  I have no concerns of abuse or  diversion.  Decreasing the Wellbutrin has probably decrease the anxiety as well, I am glad she is not needing more than 2/day of the Ativan, at least most days.  Overall she is doing well so no changes will be made.  Continue Wellbutrin XL 300mg  q am.   Continue Cymbalta 30 mg daily. Continue Ativan 1 mg, 1 p.o. every 6 hours as needed anxiety.  Of course as always take as little as possible. Cont B complex, CoQ 10, grape seed extract, omega-3, turmeric, magnesium, L-carnitine, chelated iron, and Cognizin Citicoline, Huperizine, and multivitamin. Return in 3 months.  , PA-C

## 2022-03-18 DIAGNOSIS — M542 Cervicalgia: Secondary | ICD-10-CM | POA: Diagnosis not present

## 2022-03-25 DIAGNOSIS — M542 Cervicalgia: Secondary | ICD-10-CM | POA: Diagnosis not present

## 2022-04-20 DIAGNOSIS — Z818 Family history of other mental and behavioral disorders: Secondary | ICD-10-CM | POA: Diagnosis not present

## 2022-04-20 DIAGNOSIS — R32 Unspecified urinary incontinence: Secondary | ICD-10-CM | POA: Diagnosis not present

## 2022-04-20 DIAGNOSIS — R69 Illness, unspecified: Secondary | ICD-10-CM | POA: Diagnosis not present

## 2022-04-20 DIAGNOSIS — Z87891 Personal history of nicotine dependence: Secondary | ICD-10-CM | POA: Diagnosis not present

## 2022-04-20 DIAGNOSIS — Z008 Encounter for other general examination: Secondary | ICD-10-CM | POA: Diagnosis not present

## 2022-04-20 DIAGNOSIS — E785 Hyperlipidemia, unspecified: Secondary | ICD-10-CM | POA: Diagnosis not present

## 2022-04-20 DIAGNOSIS — F411 Generalized anxiety disorder: Secondary | ICD-10-CM | POA: Diagnosis not present

## 2022-04-20 DIAGNOSIS — Z791 Long term (current) use of non-steroidal anti-inflammatories (NSAID): Secondary | ICD-10-CM | POA: Diagnosis not present

## 2022-04-20 DIAGNOSIS — M1990 Unspecified osteoarthritis, unspecified site: Secondary | ICD-10-CM | POA: Diagnosis not present

## 2022-04-30 DIAGNOSIS — M542 Cervicalgia: Secondary | ICD-10-CM

## 2022-04-30 HISTORY — DX: Cervicalgia: M54.2

## 2022-05-16 ENCOUNTER — Telehealth: Payer: Self-pay | Admitting: Physician Assistant

## 2022-05-16 NOTE — Telephone Encounter (Signed)
Husband wants to switch back to alprazolam from lorazepam. He felt like patient was taking too much lorazepam and didn't seem to need as much alprazolam. He said she only took it about 3 times before going back to lorazepam. Pt does have 2 refills of lorazepam available but said we could cancel that. Husband also states patient seems to be more depressed and thinks the duloxetine needs to be increased or medication changed. She is not crying but seems to be sensitive/emotional about what husband says are "nitpicky things." He has had a neck injury that is causing her discomfort, has been seen by Ortho. Says she is sleeping okay, though pain does wake her occasionally. Denied any other stressors or any other concerns.  I did not pend a RF for alprazolam.   Pharmacy - [facility] on [address].    03/14/2022 03/14/2022 1  Lorazepam 1 Mg Tablet 120.00 30 [place] [id number] [place] ([id number]) 0/2 4.00 LME Medicare [place] 02/07/2022 02/07/2022 1  Alprazolam 0.5 Mg Tablet 120.00 30 [place]

## 2022-05-16 NOTE — Telephone Encounter (Signed)
Patient's husband Lennette Bihari called stating that tried the Xanax and [name] seems to be doing well with it. She will be out by the end of the week and will need refill. He also asked about increasing dose for Duloxetine as [name] has been more depressed lately. Pls call if needed. Ph: [place] [facility]

## 2022-05-17 ENCOUNTER — Other Ambulatory Visit: Payer: Self-pay | Admitting: Physician Assistant

## 2022-05-17 MED ORDER — ALPRAZOLAM 0.5 MG PO TABS
0.5000 mg | ORAL_TABLET | Freq: Four times a day (QID) | ORAL | 0 refills | Status: DC
Start: 2022-05-17 — End: 2022-06-17

## 2022-05-17 MED ORDER — DULOXETINE HCL 60 MG PO CPEP: 60.0000 mg | ORAL_CAPSULE | Freq: Every day | ORAL | 1 refills | Status: AC

## 2022-05-17 NOTE — Telephone Encounter (Signed)
Please call the pharmacy and cancel the refills on Ativan. Let him know I sent in a prescription for Xanax at the same dose she was on before, 0.5 mg.  She can take it up to 4 times a day.  It is about twice as strong as the Ativan so that is why she has been needing more.  They should keep that in mind with the Xanax, she may only need it twice a day but it is okay to take 4 times a day if needed. I increased the Cymbalta to 60 mg, 1 daily.  Prescription for that sent also. Thank you.

## 2022-05-17 NOTE — Telephone Encounter (Signed)
Patient notified of Rx and recommendations. Lorazepam RF canceled at [facility].

## 2022-05-28 DIAGNOSIS — M542 Cervicalgia: Secondary | ICD-10-CM | POA: Diagnosis not present

## 2022-05-30 DIAGNOSIS — M47812 Spondylosis without myelopathy or radiculopathy, cervical region: Secondary | ICD-10-CM | POA: Diagnosis not present

## 2022-05-30 DIAGNOSIS — M542 Cervicalgia: Secondary | ICD-10-CM | POA: Diagnosis not present

## 2022-05-30 HISTORY — DX: Spondylosis without myelopathy or radiculopathy, cervical region: M47.812

## 2022-06-09 ENCOUNTER — Other Ambulatory Visit: Payer: Self-pay | Admitting: Physician Assistant

## 2022-06-12 DIAGNOSIS — M542 Cervicalgia: Secondary | ICD-10-CM

## 2022-06-12 HISTORY — DX: Cervicalgia: M54.2

## 2022-06-17 ENCOUNTER — Ambulatory Visit (INDEPENDENT_AMBULATORY_CARE_PROVIDER_SITE_OTHER): Payer: Medicare HMO | Admitting: Physician Assistant

## 2022-06-17 ENCOUNTER — Encounter: Payer: Self-pay | Admitting: Physician Assistant

## 2022-06-17 DIAGNOSIS — R413 Other amnesia: Secondary | ICD-10-CM | POA: Diagnosis not present

## 2022-06-17 DIAGNOSIS — F4321 Adjustment disorder with depressed mood: Secondary | ICD-10-CM

## 2022-06-17 DIAGNOSIS — F410 Panic disorder [episodic paroxysmal anxiety] without agoraphobia: Secondary | ICD-10-CM

## 2022-06-17 DIAGNOSIS — Z1589 Genetic susceptibility to other disease: Secondary | ICD-10-CM

## 2022-06-17 DIAGNOSIS — R69 Illness, unspecified: Secondary | ICD-10-CM | POA: Diagnosis not present

## 2022-06-17 MED ORDER — LORAZEPAM 1 MG PO TABS: 1.0000 mg | ORAL_TABLET | Freq: Three times a day (TID) | ORAL | 0 refills | Status: AC

## 2022-06-17 NOTE — Progress Notes (Signed)
[facility]  Patient ID: Courtney Beard,  MRN: [id number]  PCP: Courtney Brunette, MD  Date of Evaluation: 06/17/2022 time spent:30 minutes  Chief Complaint:  Chief Complaint   Follow-up    HISTORY/CURRENT STATUS:  HPI  For routine med check.  Husband Courtney Beard usually comes in with her but she asked him to stay in the waiting room.  Courtney Beard states that she is having a very hard time because she is so forgetful, and says that Courtney Beard gets a little irritated with her when she forgets certain things.  She knows he is under a lot of pressure right now, they are preparing their home to sell and are building a new home.   It scares her to know that her memory is getting much worse.  She is enrolled in a clinical trial for a drug for dementia, will start within the next month or so.  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  She feels sad and depressed, knowing that her memory is worsening all the time.  She is still able to enjoy things, goes to the gym daily, that is the highlight of her day.  Appetite is normal and weight is stable.  ADLs and personal hygiene are normal.  No suicidal or homicidal thoughts.  Anxiety is still very bad, gets upset with herself because she does not remember things.  Ativan was changed to Xanax last month.  She does not feel like it is helping as well as the Ativan did.  See phone note from 05/16/2022.  Patient denies increased energy with decreased need for sleep, increased talkativeness, racing thoughts, impulsivity or risky behaviors, increased spending, increased libido, grandiosity, increased irritability or anger, paranoia, or hallucinations.  Denies dizziness, syncope, seizures, numbness, tingling, tremor, tics, unsteady gait, slurred speech, confusion. Denies muscle or joint pain, stiffness, or dystonia.  Individual Medical History/ Review of Systems: Changes? :No    Past medications for mental health diagnoses include: Lexapro,  Wellbutrin SR, Klonopin (which she OD'ed on.) Melatonin, prozac, Cymbalta, Namenda BuSpar made her feel funny, Ativan, Aricept caused severe nightmares, [name] caused headaches, Lithium, Cerafolin NAC, [name] wasn't helpful, Trintellix  Allergies: [name] [desvenlafaxine succinate er] and Erythromycin  Current Medications:  Current Outpatient Medications:    acetaminophen (TYLENOL) 325 MG tablet, Take 2 tablets (650 mg total) by mouth every 6 (six) hours as needed for fever., Disp: , Rfl:    b complex vitamins capsule, Take 1 capsule by mouth daily., Disp: , Rfl:    buPROPion (WELLBUTRIN XL) 300 MG 24 hr tablet, TAKE 1 TABLET BY MOUTH DAILY, Disp: 90 tablet, Rfl: 0   Coenzyme Q10 (CO Q10) 100 MG CAPS, Take by mouth., Disp: , Rfl:    DULoxetine (CYMBALTA) 60 MG capsule, Take 1 capsule (60 mg total) by mouth daily., Disp: 30 capsule, Rfl: 1   Ferrous Sulfate (IRON PO), Take 65 mg by mouth daily. 27 mg 4 times per week, Disp: , Rfl:    Grape Seed Extract 100 MG CAPS, Take 4 capsules by mouth., Disp: , Rfl:    LORazepam (ATIVAN) 1 MG tablet, Take 1 tablet (1 mg total) by mouth 3 (three) times daily., Disp: 90 tablet, Rfl: 0   Multiple Vitamins-Minerals (MULTI-DAY PLUS MINERALS PO), Take 1 tablet by mouth daily., Disp: , Rfl:    Omega-3 Fatty Acids (FISH OIL MAXIMUM STRENGTH) 1200 MG CPDR, Take 1,200 capsules by mouth 2 (two) times daily. , Disp: , Rfl:    Specialty Vitamins Products (COLLAGEN  ULTRA) CAPS, Take by mouth., Disp: , Rfl:    Specialty Vitamins Products (MAGNESIUM, AMINO ACID CHELATE,) 133 MG tablet, Take 1 tablet by mouth in the morning., Disp: , Rfl:    UNABLE TO FIND, Med Name: tumeric 500  Mg daily, Disp: , Rfl:    UNABLE TO FIND, Huperizine (Patient not taking: Reported on 03/14/2022), Disp: , Rfl:  Medication Side Effects: none  Family Medical/ Social History: Changes? no  MENTAL HEALTH EXAM:  There were no vitals taken for this visit.There is no height or weight on file to  calculate BMI.  General Appearance: Casual, Neat and Well Groomed  Eye Contact:  Good  Speech:  Clear and Coherent and Normal Rate  Volume:  Normal  Mood:  Anxious  Affect:  Anxious  Thought Process:  Goal Directed and Descriptions of Associations: Circumstantial  Orientation:  Full (Time, Place, and Person)  Thought Content: Rumination   Suicidal Thoughts:  No  Homicidal Thoughts:  No  Memory:  Immediate;   Poor Recent;   Poor Remote;   Fair  Judgement:  Fair  Insight:  Good  Psychomotor Activity:  Normal  Concentration:  Concentration: Fair and Attention Span: Fair  Recall:  Good  Fund of Knowledge: Good  Language: Good  Assets:  Desire for Improvement Financial Resources/Insurance Housing Social Support Transportation  ADL's:  Intact  Cognition: WNL  Prognosis:  Fair   05/08/2020 GeneSight test results complete results can be found scanned in the chart under media.  Wellbutrin is listed as no gene drug interaction to be used as directed.  Other meds listed to be used as directed include clomipramine, desipramine, [name], Cymbalta and Prozac both of which she has taken in the past, [name], Fetzima, mirtazapine, nortriptyline, trazodone, Effexor, Viibryd, and Trintellix. See Lexapro. Anxiolytics and hypnotics: Ativan is to be used as directed, and all in this category are in the use as directed category. 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   [facility] [place] [facility] FOR NEUROPSYCH EVAL BY DR. Alroy Beard MERZ 10/25/2019  DIAGNOSES:    ICD-10-CM   1. Severe anxiety with panic  F41.0     2. Situational depression  F43.21     3. Memory loss  R41.3     4. 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  Last Xanax filled 05/17/2022.   I provided 30 minutes of face to face time during this encounter, including time  spent before and after the visit in records review, medical decision making, counseling pertinent to today's visit, and charting.   Complete instructions were written on the AVS, her husband and sons can review them.  I discussed with her as well. Ativan is half as strong as Xanax, that is the reason it seems that she is taking more of that.  In my opinion it has been more helpful than Xanax when we have switched in the past few years.  I know the data of increased confusion and imbalance with increased risk of falling with any benzos but in my opinion the benefits outweigh the risk in her situation.  I recommend changing back to Ativan and have her take 1 mg 3 times daily, routinely, not as needed.  I am looking into prescribing Loreev XR which is a new long-acting lorazepam.  If that can be  approved then I prefer her to take that.  In the meantime she will stop the Xanax, continue Wellbutrin and Cymbalta and restart Ativan.  Discontinue Xanax. Continue Wellbutrin XL 300mg  q am.   Continue Cymbalta 60 mg daily. Restart Ativan 1 mg, 1 p.o. 3 times daily routinely.   Cont B complex, CoQ 10, grape seed extract, omega-3, turmeric, magnesium, L-carnitine, chelated iron, and Cognizin Citicoline, Huperizine, and multivitamin. Return in 4 weeks.  , PA-C

## 2022-06-17 NOTE — Patient Instructions (Addendum)
Stop the Xanax. Restart Ativan 1 mg but take routinely 3 times a day, not as needed.  I'm looking at starting Loreev XR (long acting Lorazepam (Ativan) but have to check insurance coverage first.  Continue same dose of Wellbutrin XL and Cymbalta.

## 2022-06-19 DIAGNOSIS — M542 Cervicalgia: Secondary | ICD-10-CM | POA: Diagnosis not present

## 2022-06-21 ENCOUNTER — Encounter (HOSPITAL_COMMUNITY): Payer: Self-pay

## 2022-06-21 ENCOUNTER — Telehealth: Payer: Self-pay | Admitting: Physician Assistant

## 2022-06-21 ENCOUNTER — Telehealth: Payer: Self-pay

## 2022-06-21 ENCOUNTER — Emergency Department (HOSPITAL_COMMUNITY)
Admission: EM | Admit: 2022-06-21 | Discharge: 2022-06-21 | Discharge status: Against Medical Advice | Payer: Medicare HMO | Attending: Emergency Medicine | Admitting: Emergency Medicine

## 2022-06-21 ENCOUNTER — Other Ambulatory Visit: Payer: Self-pay

## 2022-06-21 ENCOUNTER — Emergency Department (HOSPITAL_COMMUNITY): Payer: Medicare HMO

## 2022-06-21 DIAGNOSIS — R2681 Unsteadiness on feet: Secondary | ICD-10-CM | POA: Insufficient documentation

## 2022-06-21 DIAGNOSIS — R4182 Altered mental status, unspecified: Secondary | ICD-10-CM | POA: Insufficient documentation

## 2022-06-21 DIAGNOSIS — Z5321 Procedure and treatment not carried out due to patient leaving prior to being seen by health care provider: Secondary | ICD-10-CM | POA: Diagnosis not present

## 2022-06-21 LAB — CBC WITH DIFFERENTIAL/PLATELET
Abs Immature Granulocytes: 0.02 10*3/uL (ref 0.00–0.07)
Basophils Absolute: 0 10*3/uL (ref 0.0–0.1)
Basophils Relative: 0 %
Eosinophils Absolute: 0 10*3/uL (ref 0.0–0.5)
Eosinophils Relative: 1 %
HCT: 34.1 % — ABNORMAL LOW (ref 36.0–46.0)
Hemoglobin: 11 g/dL — ABNORMAL LOW (ref 12.0–15.0)
Immature Granulocytes: 0 %
Lymphocytes Relative: 33 %
Lymphs Abs: 2.4 10*3/uL (ref 0.7–4.0)
MCH: 26.4 pg (ref 26.0–34.0)
MCHC: 32.3 g/dL (ref 30.0–36.0)
MCV: 81.8 fL (ref 80.0–100.0)
Monocytes Absolute: 0.8 10*3/uL (ref 0.1–1.0)
Monocytes Relative: 11 %
Neutro Abs: 4 10*3/uL (ref 1.7–7.7)
Neutrophils Relative %: 55 %
Platelets: 321 10*3/uL (ref 150–400)
RBC: 4.17 MIL/uL (ref 3.87–5.11)
RDW: 14 % (ref 11.5–15.5)
WBC: 7.2 10*3/uL (ref 4.0–10.5)
nRBC: 0 % (ref 0.0–0.2)

## 2022-06-21 LAB — COMPREHENSIVE METABOLIC PANEL
ALT: 21 U/L (ref 0–44)
AST: 31 U/L (ref 15–41)
Albumin: 4.1 g/dL (ref 3.5–5.0)
Alkaline Phosphatase: 52 U/L (ref 38–126)
Anion gap: 8 (ref 5–15)
BUN: 13 mg/dL (ref 8–23)
CO2: 25 mmol/L (ref 22–32)
Calcium: 9.4 mg/dL (ref 8.9–10.3)
Chloride: 106 mmol/L (ref 98–111)
Creatinine, Ser: 0.97 mg/dL (ref 0.44–1.00)
GFR, Estimated: >60 mL/min (ref >60)
Glucose, Bld: 96 mg/dL (ref 70–99)
Potassium: 4.6 mmol/L (ref 3.5–5.1)
Sodium: 139 mmol/L (ref 135–145)
Total Bilirubin: 0.6 mg/dL (ref 0.3–1.2)
Total Protein: 7.1 g/dL (ref 6.5–8.1)

## 2022-06-21 LAB — URINALYSIS, ROUTINE W REFLEX MICROSCOPIC
Bilirubin Urine: NEGATIVE
Glucose, UA: NEGATIVE mg/dL
Hgb urine dipstick: NEGATIVE
Ketones, ur: NEGATIVE mg/dL
Leukocytes,Ua: NEGATIVE
Nitrite: NEGATIVE
Protein, ur: NEGATIVE mg/dL
Specific Gravity, Urine: 1.004 — ABNORMAL LOW (ref 1.005–1.030)
pH: 7 (ref 5.0–8.0)

## 2022-06-21 NOTE — Telephone Encounter (Signed)
Husband notified of recommendations.

## 2022-06-21 NOTE — ED Provider Triage Note (Signed)
Emergency Medicine Provider Triage Evaluation Note  Courtney Beard , a 71 y.o. female  was evaluated in triage.  Pt complains of altered mental status. Husband states that since waking up yesterday morning she has been more confused than normal. He states that she was recently changed from Xanax to Ativan. Has been taking all meds as prescribed. Husband states that she has been more fidgety and unsteady on her feet. He states that on the way into the ER today she was concerned they left their adult kids in the car. She states she has tripped twice in the past 2 days but has been able to catch herself before she falls. Denies hitting her head or any injuries from this. Patient denies any concerns or complaints.  Upon chart review it appears that patient does have a history of cognitive decline and worsening memory issues.  Review of Systems  Positive:  Negative:   Physical Exam  BP 138/82 (BP Location: Left Arm)   Pulse (!) 122   Temp 98.5 F (36.9 C) (Oral)   Resp 18   Ht 5\' 2"  (1.575 m)   Wt 44 kg   SpO2 100%   BMI 17.74 kg/m  Gen:   Awake, no distress   Resp:  Normal effort  MSK:   Moves extremities without difficulty  Other:  Patient is alert and oriented and neurologically intact without focal deficits  Medical Decision Making  Medically screening exam initiated at 2:42 PM.  Appropriate orders placed.  JULIANAH MARCIEL was informed that the remainder of the evaluation will be completed by another provider, this initial triage assessment does not replace that evaluation, and the importance of remaining in the ED until their evaluation is complete.     Leonides Schanz, PA-C 06/21/22 1448

## 2022-06-21 NOTE — Telephone Encounter (Signed)
Husband said patient is confused and anxious since changing back to Ativan. She took 2 Ativan within 30 minutes of each other yesterday because she was so anxious and that helped. Husband said she is still confused this morning, though didn't take but 3 Ativan yesterday. He said he wonders if she has a chemical imbalance. She doesn't see Dr. Rachel Bo until March. He said she did have multiple cups of coffee yesterday.

## 2022-06-21 NOTE — Telephone Encounter (Signed)
Please make sure he reviewed the instructions on the AVS. The Ativan is 1/2 the strength of the Xanax, that's why the dose is higher. Caffeine probably made her more anxious.  I prefer that he be in charge of the Ativan so she will not overtake it.

## 2022-06-21 NOTE — ED Triage Notes (Signed)
Pt husband states pt has been having confusion and erratic behavior since yesterday. Pt denies urinary symptoms. Similar episode when pt was switched from Lexapro to different medication per husband.

## 2022-06-21 NOTE — Telephone Encounter (Signed)
Spouse, [name], called at 10:00 because [name] is in a very confused state since the medication changes recently made.  Please call him to discuss the changes and make sure the medications are being taken as they should be. [phone]

## 2022-06-24 NOTE — Telephone Encounter (Signed)
Pt's husband called today at 9:20a.  He wants to talk to someone about the switch from Xanax to Ativan.  He said pt had bizarre behavior over the weekend, twitching, thinking husband's parents were alive and they have been dead 26 yrs, thinking her husband was her father.  Pls call back to discuss if they are taking it correctly.

## 2022-06-24 NOTE — Telephone Encounter (Signed)
Please see message from husband. He took her to ER Friday due to her behaviors. Labs were drawn with no UTI. Husband wanted a psych eval done, but they waited 5 hours in the ER with no bed and no eval and left. Husband wonders if she should have weaned off one bz before starting another. Last week suggested to husband that he not put bz in her pill box, which was not done. He said she is only getting the 3 per day prescribed, but she could and did take one from another compartment. He said today that she moved meds around to different compartments the past week. He said he would give her the bz separately. Husband said she seems to be getting better.

## 2022-06-24 NOTE — Telephone Encounter (Signed)
noted 

## 2022-06-26 DIAGNOSIS — M542 Cervicalgia: Secondary | ICD-10-CM | POA: Diagnosis not present

## 2022-07-03 DIAGNOSIS — M542 Cervicalgia: Secondary | ICD-10-CM | POA: Diagnosis not present

## 2022-07-09 DIAGNOSIS — F411 Generalized anxiety disorder: Secondary | ICD-10-CM | POA: Diagnosis not present

## 2022-07-09 DIAGNOSIS — F331 Major depressive disorder, recurrent, moderate: Secondary | ICD-10-CM | POA: Diagnosis not present

## 2022-07-09 DIAGNOSIS — R69 Illness, unspecified: Secondary | ICD-10-CM | POA: Diagnosis not present

## 2022-07-13 ENCOUNTER — Other Ambulatory Visit: Payer: Self-pay | Admitting: Physician Assistant

## 2022-07-15 NOTE — Telephone Encounter (Signed)
Please call to schedule an appt. Last seen in November, RTC in 4 weeks.

## 2022-07-16 NOTE — Telephone Encounter (Signed)
Spoke to husband and and they don't need any refills at this time. They are in the process of moving and will call back to reschedule in a few weeks

## 2022-07-17 ENCOUNTER — Ambulatory Visit: Payer: Medicare HMO | Admitting: Physician Assistant

## 2022-07-24 DIAGNOSIS — M542 Cervicalgia: Secondary | ICD-10-CM | POA: Diagnosis not present

## 2022-07-29 DIAGNOSIS — M542 Cervicalgia: Secondary | ICD-10-CM | POA: Diagnosis not present

## 2022-07-29 DIAGNOSIS — M791 Myalgia, unspecified site: Secondary | ICD-10-CM | POA: Diagnosis not present

## 2022-07-29 DIAGNOSIS — M47812 Spondylosis without myelopathy or radiculopathy, cervical region: Secondary | ICD-10-CM | POA: Diagnosis not present

## 2022-07-29 DIAGNOSIS — M7918 Myalgia, other site: Secondary | ICD-10-CM

## 2022-07-29 HISTORY — DX: Myalgia, other site: M79.18

## 2022-08-08 DIAGNOSIS — M47812 Spondylosis without myelopathy or radiculopathy, cervical region: Secondary | ICD-10-CM | POA: Diagnosis not present

## 2022-08-16 DIAGNOSIS — R69 Illness, unspecified: Secondary | ICD-10-CM | POA: Diagnosis not present

## 2022-08-16 DIAGNOSIS — F331 Major depressive disorder, recurrent, moderate: Secondary | ICD-10-CM | POA: Diagnosis not present

## 2022-08-16 DIAGNOSIS — F411 Generalized anxiety disorder: Secondary | ICD-10-CM | POA: Diagnosis not present

## 2022-08-20 DIAGNOSIS — E7800 Pure hypercholesterolemia, unspecified: Secondary | ICD-10-CM | POA: Diagnosis not present

## 2022-08-20 DIAGNOSIS — Z0000 Encounter for general adult medical examination without abnormal findings: Secondary | ICD-10-CM | POA: Diagnosis not present

## 2022-08-20 DIAGNOSIS — R69 Illness, unspecified: Secondary | ICD-10-CM | POA: Diagnosis not present

## 2022-08-20 DIAGNOSIS — Z1331 Encounter for screening for depression: Secondary | ICD-10-CM | POA: Diagnosis not present

## 2022-08-20 DIAGNOSIS — M8580 Other specified disorders of bone density and structure, unspecified site: Secondary | ICD-10-CM | POA: Diagnosis not present

## 2022-08-21 DIAGNOSIS — F411 Generalized anxiety disorder: Secondary | ICD-10-CM | POA: Diagnosis not present

## 2022-08-21 DIAGNOSIS — F331 Major depressive disorder, recurrent, moderate: Secondary | ICD-10-CM | POA: Diagnosis not present

## 2022-08-21 DIAGNOSIS — R69 Illness, unspecified: Secondary | ICD-10-CM | POA: Diagnosis not present

## 2022-08-23 DIAGNOSIS — M542 Cervicalgia: Secondary | ICD-10-CM | POA: Diagnosis not present

## 2022-08-23 DIAGNOSIS — M47812 Spondylosis without myelopathy or radiculopathy, cervical region: Secondary | ICD-10-CM | POA: Diagnosis not present

## 2022-08-28 DIAGNOSIS — F331 Major depressive disorder, recurrent, moderate: Secondary | ICD-10-CM | POA: Diagnosis not present

## 2022-08-28 DIAGNOSIS — R69 Illness, unspecified: Secondary | ICD-10-CM | POA: Diagnosis not present

## 2022-08-28 DIAGNOSIS — F411 Generalized anxiety disorder: Secondary | ICD-10-CM | POA: Diagnosis not present

## 2022-09-03 DIAGNOSIS — M542 Cervicalgia: Secondary | ICD-10-CM | POA: Diagnosis not present

## 2022-09-04 DIAGNOSIS — F331 Major depressive disorder, recurrent, moderate: Secondary | ICD-10-CM | POA: Diagnosis not present

## 2022-09-04 DIAGNOSIS — F411 Generalized anxiety disorder: Secondary | ICD-10-CM | POA: Diagnosis not present

## 2022-09-04 DIAGNOSIS — R69 Illness, unspecified: Secondary | ICD-10-CM | POA: Diagnosis not present

## 2022-09-05 ENCOUNTER — Other Ambulatory Visit: Payer: Self-pay | Admitting: Physician Assistant

## 2022-09-06 DIAGNOSIS — M542 Cervicalgia: Secondary | ICD-10-CM | POA: Diagnosis not present

## 2022-09-10 DIAGNOSIS — M542 Cervicalgia: Secondary | ICD-10-CM | POA: Diagnosis not present

## 2022-10-02 DIAGNOSIS — F331 Major depressive disorder, recurrent, moderate: Secondary | ICD-10-CM | POA: Diagnosis not present

## 2022-10-02 DIAGNOSIS — F411 Generalized anxiety disorder: Secondary | ICD-10-CM | POA: Diagnosis not present

## 2022-10-02 DIAGNOSIS — R69 Illness, unspecified: Secondary | ICD-10-CM | POA: Diagnosis not present

## 2022-10-07 DIAGNOSIS — S13151A Dislocation of C4/C5 cervical vertebrae, initial encounter: Secondary | ICD-10-CM | POA: Diagnosis not present

## 2022-10-07 DIAGNOSIS — M0688 Other specified rheumatoid arthritis, vertebrae: Secondary | ICD-10-CM | POA: Diagnosis not present

## 2022-10-07 DIAGNOSIS — M5481 Occipital neuralgia: Secondary | ICD-10-CM | POA: Diagnosis not present

## 2022-10-07 DIAGNOSIS — M4312 Spondylolisthesis, cervical region: Secondary | ICD-10-CM

## 2022-10-07 DIAGNOSIS — M0638 Rheumatoid nodule, vertebrae: Secondary | ICD-10-CM | POA: Diagnosis not present

## 2022-10-07 DIAGNOSIS — Z1330 Encounter for screening examination for mental health and behavioral disorders, unspecified: Secondary | ICD-10-CM | POA: Diagnosis not present

## 2022-10-07 DIAGNOSIS — S13140A Subluxation of C3/C4 cervical vertebrae, initial encounter: Secondary | ICD-10-CM | POA: Diagnosis not present

## 2022-10-07 DIAGNOSIS — M47812 Spondylosis without myelopathy or radiculopathy, cervical region: Secondary | ICD-10-CM | POA: Diagnosis not present

## 2022-10-07 DIAGNOSIS — R636 Underweight: Secondary | ICD-10-CM | POA: Diagnosis not present

## 2022-10-07 DIAGNOSIS — S13150A Subluxation of C4/C5 cervical vertebrae, initial encounter: Secondary | ICD-10-CM | POA: Diagnosis not present

## 2022-10-07 DIAGNOSIS — M50322 Other cervical disc degeneration at C5-C6 level: Secondary | ICD-10-CM | POA: Diagnosis not present

## 2022-10-07 HISTORY — DX: Rheumatoid nodule, vertebrae: M06.38

## 2022-10-07 HISTORY — DX: Occipital neuralgia: M54.81

## 2022-10-07 HISTORY — DX: Spondylolisthesis, cervical region: M43.12

## 2022-10-30 DIAGNOSIS — F411 Generalized anxiety disorder: Secondary | ICD-10-CM | POA: Diagnosis not present

## 2022-10-30 DIAGNOSIS — F331 Major depressive disorder, recurrent, moderate: Secondary | ICD-10-CM | POA: Diagnosis not present

## 2022-10-30 DIAGNOSIS — R69 Illness, unspecified: Secondary | ICD-10-CM | POA: Diagnosis not present

## 2022-10-31 ENCOUNTER — Encounter: Payer: Self-pay | Admitting: Psychology

## 2022-11-01 ENCOUNTER — Ambulatory Visit: Payer: Medicare HMO | Admitting: Psychology

## 2022-11-01 ENCOUNTER — Other Ambulatory Visit: Payer: Self-pay | Admitting: Psychology

## 2022-11-01 ENCOUNTER — Encounter: Payer: Self-pay | Admitting: Psychology

## 2022-11-01 DIAGNOSIS — J9601 Acute respiratory failure with hypoxia: Secondary | ICD-10-CM

## 2022-11-01 DIAGNOSIS — F1011 Alcohol abuse, in remission: Secondary | ICD-10-CM | POA: Diagnosis not present

## 2022-11-01 DIAGNOSIS — R4189 Other symptoms and signs involving cognitive functions and awareness: Secondary | ICD-10-CM

## 2022-11-01 DIAGNOSIS — F03A0 Unspecified dementia, mild, without behavioral disturbance, psychotic disturbance, mood disturbance, and anxiety: Secondary | ICD-10-CM | POA: Insufficient documentation

## 2022-11-01 DIAGNOSIS — F411 Generalized anxiety disorder: Secondary | ICD-10-CM | POA: Diagnosis not present

## 2022-11-01 DIAGNOSIS — G9341 Metabolic encephalopathy: Secondary | ICD-10-CM

## 2022-11-01 DIAGNOSIS — F03A3 Unspecified dementia, mild, with mood disturbance: Secondary | ICD-10-CM

## 2022-11-01 DIAGNOSIS — R69 Illness, unspecified: Secondary | ICD-10-CM | POA: Diagnosis not present

## 2022-11-01 HISTORY — DX: Unspecified dementia, mild, without behavioral disturbance, psychotic disturbance, mood disturbance, and anxiety: F03.A0

## 2022-11-01 NOTE — Progress Notes (Unsigned)
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 were within normal limits. Gross motor functioning appeared intact upon informal observation and no abnormal movements (e.g., tremors) were noted. Her affect was generally relaxed and positive, but did range appropriately given the subject being discussed during the clinical interview. She did express feeling nervous surrounding upcoming testing procedures. Spontaneous speech was fluent and word finding difficulties were not observed during the clinical interview. Thought processes were coherent, organized, and normal in content. Insight into her cognitive difficulties appeared adequate.   During testing, sustained attention was appropriate. Task engagement was adequate and she persisted when challenged. Overall, Ms. Lout was cooperative with the clinical interview and subsequent testing procedures.   Adequacy of Effort: The validity of neuropsychological testing is limited by the extent to which the individual being tested may be assumed to have exerted adequate effort during testing. Ms. Puccia expressed her intention to  perform to the best of her abilities and exhibited adequate task engagement and persistence. Scores across stand-alone and embedded performance validity measures were within expectation. As such, the results of the current evaluation are believed to be a valid representation of Ms. Erway's current cognitive functioning.  Test Results: Ms. Bazin was largely oriented at the time of the current evaluation. Points were lost for her stating the incorrect day of the week ("Monday").  Intellectual abilities based upon educational and vocational attainment were estimated to be in the average range. Premorbid abilities were estimated to be within the above average range based upon a single-word reading test.   Processing speed was below average to average. Basic attention was above average. More complex attention (e.g., working memory) was average. Executive functioning was exceptionally low to well below average.  Assessed receptive language abilities were above average. Likewise, Ms. Armwood did not exhibit any difficulties comprehending task instructions and answered all questions asked of her appropriately. Assessed expressive language (e.g., verbal fluency and confrontation naming) was below average to above average.     Assessed visuospatial/visuoconstructional abilities were variable, ranging from the well below average to average normative ranges. Of note, despite her clock scoring with normal limits, she did completely omit the outer circle when drawing her clock. Numbers were placed in a circular fashion without any outer framework. She also exhibited mild spacing difficulties with numerical placement.    Learning (i.e., encoding) of novel verbal and visual information was exceptionally low to below average. Spontaneous delayed recall (i.e., retrieval) of previously learned information was average across a story-based task but exceptionally low across two other tasks. Retention  rates were 76%  across a story learning task, 33% (cued) to 50% (spontaneous) across a list learning task, and 100% (raw score of 3) across a shape learning task. Performance across recognition tasks was exceptionally low to below average, suggesting some limited evidence for information consolidation.   Results of emotional screening instruments suggested that recent symptoms of generalized anxiety were in the mild range, while symptoms of depression were within the moderate range. A screening instrument assessing recent sleep quality suggested the presence of minimal sleep dysfunction.  Tables of Scores:   Note: This summary of test scores accompanies the interpretive report and should not be considered in isolation without reference to the appropriate sections in the text. Descriptors are based on appropriate normative data and may be adjusted based on clinical judgment. Terms such as "Within Normal Limits" and "Outside Normal Limits" are used when a more specific description of the test score cannot be determined. Descriptors refer to the current evaluation only.         Percentile - Normative Descriptor > 98 - Exceptionally High 91-97 - Well Above Average 75-90 - Above Average 25-74 - Average 9-24 - Below Average 2-8 - Well Below Average < 2 - Exceptionally Low   March 2021 Current    Orientation:       Raw Score Raw Score Percentile   NAB Orientation, Form 1 29/29 27/29 --- ---        Cognitive Screening:       Raw Score Raw Score Percentile   SLUMS: --- 20/30 --- ---        Intellectual Functioning:       Standard Score Standard Score Percentile   Test of Premorbid Functioning: 122 119 90 Above Average        Memory:      Wechsler Memory Scale (WMS-IV):                       Raw Score (Scaled Score) Raw Score (Scaled Score) Percentile     Logical Memory I 25/53 (7) 22/53 (6) 9 Below Average    Logical Memory II 18/39 (10) 13/39 (8) 25 Average    Logical Memory Recognition 17/23 17/23 17-25  Below Average        California Verbal Learning Test (CVLT-III) Brief Form: Raw Score (Scaled/Standard Score) Raw Score (Scaled/Standard Score) Percentile     Total Trials 1-4 21/36 (77) 18/36 (65) 1 Exceptionally Low    Short-Delay Free Recall 3/9 (1) 3/9 (1) <1 Exceptionally Low    Long-Delay Free Recall 4/9 (6) 3/9 (4) 2 Well Below Average    Long-Delay Cued Recall 4/9 (4) 2/9 (1) <1 Exceptionally Low      Recognition Hits 8/9 (9) 6/9 (5) 5 Well Below Average      False Positive Errors 2 (7) 5 (2) <1 Exceptionally Low        Brief Visuospatial Memory Test (BVMT-R), Form 1: Raw Score (T Score) Raw Score (T Score) Percentile     Total Trials 1-3 20/36 (47) 7/36 (25) 1 Exceptionally Low    Delayed Recall 6/12 (40) 3/12 (28) 2 Well Below Average    Recognition Discrimination Index 6 3 --- Well Below Average      Recognition Hits 6/6 3/6 --- Well Below Average      False Positive Errors 0 0 --- Within Normal Limits         Attention/Executive Function:      Trail Making Test (TMT): Raw  Score (T Score) Raw Score (T Score) Percentile     Part A 34 secs.,  0 errors (47) 39 secs.,  1 error (42) 21 Below Average    Part B 73 secs.,  1 error (53) 128 secs.,  2 errors (35) 7 Well Below Average          Scaled Score Scaled Score Percentile   WAIS-IV Coding: 11 7 16  Below Average        NAB Attention Module, Form 1: T Score T Score Percentile     Digits Forward 53 57 75 Above Average    Digits Backwards 49 49 46 Average        D-KEFS Color-Word Interference Test: Raw Score (Scaled Score) Raw Score (Scaled Score) Percentile     Color Naming 32 secs. (10) 40 secs. (7) 16 Below Average    Word Reading 21 secs. (12) 25 secs. (10) 50 Average    Inhibition 95 secs. (5) 91 secs. (6) 9 Below Average      Total Errors 7 errors (4) 9 errors (2) <1 Exceptionally Low    Inhibition/Switching 74 secs. (10) 79 secs. (10) 50 Average      Total Errors 10 errors (3) 20 errors (1) <1  Exceptionally Low        D-KEFS Verbal Fluency Test: Raw Score (Scaled Score) Raw Score (Scaled Score) Percentile     Letter Total Correct 43 (12) 29 (8) 25 Average    Category Total Correct 27 (7) 32 (9) 37 Average    Category Switching Total Correct 13 (11) 5 (1) <1 Exceptionally Low    Category Switching Accuracy 12 (11) 4 (3) 1 Exceptionally Low      Total Set Loss Errors 1 (11) 2 (10) 50 Average      Total Repetition Errors 6 (7) 2 (11) 63 Average        Language:      Verbal Fluency Test: Raw Score (T Score) Raw Score (T Score) Percentile     Phonemic Fluency (FAS) 43 (52) 29 (41) 18 Below Average    Animal Fluency 12 (33) 14 (38) 12 Below Average         NAB Language Module, Form 1: T Score T Score Percentile     Auditory Comprehension 55 57 75 Above Average    Naming 31/31 (57) 31/31 (57) 75 Above Average        Visuospatial/Visuoconstruction:       Raw Score Raw Score Percentile   Clock Drawing: 8/10 7/10 --- Within Normal Limits        NAB Spatial Module, Form 1: T Score T Score Percentile     Visual Discrimination 53 53 62 Average    Figure Drawing Copy 52 42 21 Below Average         Scaled Score Scaled Score Percentile   WAIS-IV Block Design: 9 4 2  Well Below Average        Mood and Personality:       Raw Score Raw Score Percentile   Beck Depression Inventory - II: --- 21 --- Moderate  PROMIS Anxiety Questionnaire: --- 16 --- Mild        Additional Questionnaires:       Raw Score Raw Score Percentile   PROMIS Sleep Disturbance Questionnaire: 17 20 --- None to Slight   Informed Consent and Coding/Compliance:   The current evaluation represents a clinical evaluation for the purposes previously outlined by the referral source and is in no way  reflective of a forensic evaluation.   Ms. Kieser was provided with a verbal description of the nature and purpose of the present neuropsychological evaluation. Also reviewed were the foreseeable risks and/or discomforts  and benefits of the procedure, limits of confidentiality, and mandatory reporting requirements of this provider. The patient was given the opportunity to ask questions and receive answers about the evaluation. Oral consent to participate was provided by the patient.   This evaluation was conducted by Christia Reading, Ph.D., ABPP-CN, board certified clinical neuropsychologist. Ms. Sulek completed a clinical interview with Dr. Melvyn Novas, billed as one unit [phone], and 120 minutes of cognitive testing and scoring, billed as one unit [phone] and three additional units [id number]. Psychometrist Milana Kidney, B.S., assisted Dr. Melvyn Novas with test administration and scoring procedures. As a separate and discrete service, Dr. Melvyn Novas spent a total of 160 minutes in interpretation and report writing billed as one unit [phone] and two units [id number].

## 2022-11-01 NOTE — Progress Notes (Signed)
   Psychometrician Note   Cognitive testing was administered to Courtney Beard by Courtney Beard, B.S. (psychometrist) under the supervision of Dr. Christia Reading, Ph.D., licensed psychologist on 11/01/2022. Courtney Beard did not appear overtly distressed by the testing session per behavioral observation or responses across self-report questionnaires. Rest breaks were offered.    The battery of tests administered was selected by Dr. Christia Reading, Ph.D. with consideration to Courtney Beard's current level of functioning, the nature of her symptoms, emotional and behavioral responses during interview, level of literacy, observed level of motivation/effort, and the nature of the referral question. This battery was communicated to the psychometrist. Communication between Dr. Christia Reading, Ph.D. and the psychometrist was ongoing throughout the evaluation and Dr. Christia Reading, Ph.D. was immediately accessible at all times. Dr. Christia Reading, Ph.D. provided supervision to the psychometrist on the date of this service to the extent necessary to assure the quality of all services provided.    Courtney Beard will return within approximately 1-2 weeks for an interactive feedback session with Dr. Melvyn Novas at which time her test performances, clinical impressions, and treatment recommendations will be reviewed in detail. Courtney Beard understands she can contact our office should she require our assistance before this time.  A total of 120 minutes of billable time were spent face-to-face with Courtney Beard by the psychometrist. This includes both test administration and scoring time. Billing for these services is reflected in the clinical report generated by Dr. Christia Reading, Ph.D.  This note reflects time spent with the psychometrician and does not include test scores or any clinical interpretations made by Dr. Melvyn Novas. The full report will follow in a separate note.

## 2022-11-04 ENCOUNTER — Encounter: Payer: Self-pay | Admitting: Psychology

## 2022-11-04 ENCOUNTER — Telehealth: Payer: Self-pay | Admitting: Physician Assistant

## 2022-11-04 NOTE — Telephone Encounter (Signed)
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 Melvyn Novas. She is supposed to get back in with neurology right? She doesn't have an appt w/ me that I can tell, have them make one, after the neuro appt if possible, but if that's going to be farther than 1 month out, get in to see me in 3-4 wks. We can discuss these results and what we need to do going forward.

## 2022-11-05 DIAGNOSIS — M5481 Occipital neuralgia: Secondary | ICD-10-CM | POA: Diagnosis not present

## 2022-11-05 DIAGNOSIS — M47812 Spondylosis without myelopathy or radiculopathy, cervical region: Secondary | ICD-10-CM | POA: Diagnosis not present

## 2022-11-05 DIAGNOSIS — M542 Cervicalgia: Secondary | ICD-10-CM | POA: Diagnosis not present

## 2022-11-05 NOTE — Telephone Encounter (Signed)
Husband notified. Has F/U with Neurology on 4/1. Asked him to make an appt with Helene Kelp after that visit.

## 2022-11-06 DIAGNOSIS — M50321 Other cervical disc degeneration at C4-C5 level: Secondary | ICD-10-CM | POA: Diagnosis not present

## 2022-11-06 DIAGNOSIS — M5081 Other cervical disc disorders,  high cervical region: Secondary | ICD-10-CM | POA: Diagnosis not present

## 2022-11-06 DIAGNOSIS — M47812 Spondylosis without myelopathy or radiculopathy, cervical region: Secondary | ICD-10-CM | POA: Diagnosis not present

## 2022-11-06 DIAGNOSIS — M4312 Spondylolisthesis, cervical region: Secondary | ICD-10-CM | POA: Diagnosis not present

## 2022-11-06 DIAGNOSIS — M5031 Other cervical disc degeneration,  high cervical region: Secondary | ICD-10-CM | POA: Diagnosis not present

## 2022-11-06 DIAGNOSIS — M50323 Other cervical disc degeneration at C6-C7 level: Secondary | ICD-10-CM | POA: Diagnosis not present

## 2022-11-06 DIAGNOSIS — M50322 Other cervical disc degeneration at C5-C6 level: Secondary | ICD-10-CM | POA: Diagnosis not present

## 2022-11-06 DIAGNOSIS — M50821 Other cervical disc disorders at C4-C5 level: Secondary | ICD-10-CM | POA: Diagnosis not present

## 2022-11-06 DIAGNOSIS — M6788 Other specified disorders of synovium and tendon, other site: Secondary | ICD-10-CM | POA: Diagnosis not present

## 2022-11-11 ENCOUNTER — Ambulatory Visit: Payer: Medicare HMO | Admitting: Psychology

## 2022-11-11 DIAGNOSIS — R69 Illness, unspecified: Secondary | ICD-10-CM | POA: Diagnosis not present

## 2022-11-11 DIAGNOSIS — F03A3 Unspecified dementia, mild, with mood disturbance: Secondary | ICD-10-CM | POA: Diagnosis not present

## 2022-11-11 NOTE — Progress Notes (Signed)
   Neuropsychology Feedback Session Tillie Rung. [facility] Neu[facility]  Reason for Referral:   Courtney Beard is a 72 y.o. right-handed Caucasian female referred by  Donnal Moat, PA-C , to characterize her current cognitive functioning and assist with diagnostic clarity and treatment planning in the context of a prior mild neurocognitive disorder diagnosis and concerns for progressive cognitive decline.   Feedback:   Courtney Beard completed a comprehensive neuropsychological evaluation on 11/01/2022. Please refer to that encounter for the full report and recommendations. Briefly, results suggested a primary impairment surrounding executive functioning. While she performed reasonably well across a story-based memory task, notable impairments were also exhibited across list and shape-based memory tasks. A further impairment was exhibited across processing speed, while variability was exhibited across visuospatial abilities. Relative to her previous evaluation in March 2021, primary declines were described across executive functioning, phonemic fluency, a visuospatial block manipulation task, and visual learning and memory. More modest declines were exhibited across processing speed and delayed retrieval aspects of verbal memory (to an even less degree). Stability was observed across attention/concentration, receptive language, confrontation naming, semantic fluency, and a majority of visuospatial tasks. The etiology for ongoing cognitive dysfunction continues to remain unclear. Given evidence for some neurocognitive decline over the past three years, concerns surrounding an underlying neurodegenerative illness are certainly heightened. 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 However, it is important to highlight that she still does not exhibit classic neurocognitive  patterns consistent with this illness. Namely, performance learning and later recalling contextualized story-based content was adequate across both the current and previous evaluation. Performances across non-memory areas commonly implicated early on in this illness, namely semantic fluency and confrontation naming, also showed stable and adequate performances across the two evaluations. This is encouraging from a typically presenting Alzheimer's disease perspective. Overall, while this illness remains a possibility moving forward, I continue to be unable to rule it in with strong confidence. Continued medical monitoring will be important moving forward.   Courtney Beard was accompanied by her husband during the current feedback session. Content of the current session focused on the results of her neuropsychological evaluation. Courtney Beard was given the opportunity to ask questions and her questions were answered. She was encouraged to reach out should additional questions arise. A copy of her report was provided at the conclusion of the visit.      Greater than 31 minutes were spent preparing for, conducting, and documenting the current feedback session with Courtney Beard, billed as one unit [phone].

## 2022-11-20 DIAGNOSIS — M47812 Spondylosis without myelopathy or radiculopathy, cervical region: Secondary | ICD-10-CM | POA: Diagnosis not present

## 2022-11-20 DIAGNOSIS — H2513 Age-related nuclear cataract, bilateral: Secondary | ICD-10-CM | POA: Diagnosis not present

## 2022-11-20 DIAGNOSIS — R636 Underweight: Secondary | ICD-10-CM | POA: Diagnosis not present

## 2022-11-20 DIAGNOSIS — M0638 Rheumatoid nodule, vertebrae: Secondary | ICD-10-CM | POA: Diagnosis not present

## 2022-11-20 DIAGNOSIS — M5481 Occipital neuralgia: Secondary | ICD-10-CM | POA: Diagnosis not present

## 2022-11-25 DIAGNOSIS — F331 Major depressive disorder, recurrent, moderate: Secondary | ICD-10-CM | POA: Diagnosis not present

## 2022-11-25 DIAGNOSIS — F411 Generalized anxiety disorder: Secondary | ICD-10-CM | POA: Diagnosis not present

## 2022-11-25 DIAGNOSIS — R69 Illness, unspecified: Secondary | ICD-10-CM | POA: Diagnosis not present

## 2022-11-26 ENCOUNTER — Other Ambulatory Visit: Payer: Self-pay

## 2022-11-26 DIAGNOSIS — M542 Cervicalgia: Secondary | ICD-10-CM | POA: Diagnosis not present

## 2022-11-26 DIAGNOSIS — M47812 Spondylosis without myelopathy or radiculopathy, cervical region: Secondary | ICD-10-CM | POA: Diagnosis not present

## 2022-11-26 DIAGNOSIS — M5481 Occipital neuralgia: Secondary | ICD-10-CM | POA: Diagnosis not present

## 2022-12-17 ENCOUNTER — Encounter: Payer: Self-pay | Admitting: Diagnostic Neuroimaging

## 2022-12-17 ENCOUNTER — Ambulatory Visit: Payer: Medicare HMO | Admitting: Diagnostic Neuroimaging

## 2022-12-17 VITALS — BP 122/74 | HR 67 | Ht 62.0 in | Wt 98.4 lb

## 2022-12-17 DIAGNOSIS — F03A3 Unspecified dementia, mild, with mood disturbance: Secondary | ICD-10-CM | POA: Diagnosis not present

## 2022-12-17 DIAGNOSIS — F411 Generalized anxiety disorder: Secondary | ICD-10-CM

## 2022-12-17 DIAGNOSIS — G309 Alzheimer's disease, unspecified: Secondary | ICD-10-CM
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
## 2022-12-17 NOTE — Patient Instructions (Signed)
MEMORY LOSS (mild dementia; due to neurodegenerative dz, h/o etoh abuse, h/o cardiac arrest) - check ATN panel; check FDG PET - recommend to optimize nutrition, exercise, cognitively stimulating activities - safety / supervision issues reviewed - daily physical activity / exercise (at least 15-30 minutes) - eat more plants / vegetables - increase social activities, brain stimulation, games, puzzles, hobbies, crafts, arts, music - aim for at least 7-8 hours sleep per night (or more) - avoid smoking and alcohol - caution with medications, finances; no driving - follow up with psychiatry, psychology for depression and anxiety

## 2022-12-18 DIAGNOSIS — M5481 Occipital neuralgia: Secondary | ICD-10-CM | POA: Diagnosis not present

## 2022-12-18 DIAGNOSIS — M47812 Spondylosis without myelopathy or radiculopathy, cervical region: Secondary | ICD-10-CM | POA: Diagnosis not present

## 2022-12-18 DIAGNOSIS — M542 Cervicalgia: Secondary | ICD-10-CM | POA: Diagnosis not present

## 2022-12-20 LAB — ATN PROFILE
A -- Beta-amyloid 42/40 Ratio: 0.102 — ABNORMAL LOW (ref >0.102)
Beta-amyloid 40: 209.73 pg/mL
Beta-amyloid 42: 21.32 pg/mL
N -- NfL, Plasma: 6.89 pg/mL — ABNORMAL HIGH (ref 0.00–4.61)
T -- p-tau181: 1.98 pg/mL — ABNORMAL HIGH (ref 0.00–0.97)

## 2022-12-23 ENCOUNTER — Telehealth: Payer: Self-pay | Admitting: Diagnostic Neuroimaging

## 2022-12-23 DIAGNOSIS — M542 Cervicalgia: Secondary | ICD-10-CM | POA: Diagnosis not present

## 2022-12-23 DIAGNOSIS — M5481 Occipital neuralgia: Secondary | ICD-10-CM | POA: Diagnosis not present

## 2022-12-23 DIAGNOSIS — M47812 Spondylosis without myelopathy or radiculopathy, cervical region: Secondary | ICD-10-CM | POA: Diagnosis not present

## 2022-12-23 NOTE — Telephone Encounter (Signed)
Her insurance is requiring a peer to peer for the pet scan. I have it scheduled for this Wednesday 5/15 at 2pm. They will call us. The case number is [id number]

## 2022-12-26 DIAGNOSIS — M47812 Spondylosis without myelopathy or radiculopathy, cervical region: Secondary | ICD-10-CM | POA: Diagnosis not present

## 2022-12-26 DIAGNOSIS — M542 Cervicalgia: Secondary | ICD-10-CM | POA: Diagnosis not present

## 2022-12-26 DIAGNOSIS — M5481 Occipital neuralgia: Secondary | ICD-10-CM | POA: Diagnosis not present

## 2022-12-30 NOTE — Telephone Encounter (Signed)
Denial placed on MD desk

## 2022-12-30 NOTE — Telephone Encounter (Signed)
Denial given to pod

## 2023-01-01 DIAGNOSIS — M47812 Spondylosis without myelopathy or radiculopathy, cervical region: Secondary | ICD-10-CM | POA: Diagnosis not present

## 2023-01-01 DIAGNOSIS — M542 Cervicalgia: Secondary | ICD-10-CM | POA: Diagnosis not present

## 2023-01-01 DIAGNOSIS — M5481 Occipital neuralgia: Secondary | ICD-10-CM | POA: Diagnosis not present

## 2023-01-08 DIAGNOSIS — M542 Cervicalgia: Secondary | ICD-10-CM | POA: Diagnosis not present

## 2023-01-08 DIAGNOSIS — M5481 Occipital neuralgia: Secondary | ICD-10-CM | POA: Diagnosis not present

## 2023-01-08 DIAGNOSIS — M47812 Spondylosis without myelopathy or radiculopathy, cervical region: Secondary | ICD-10-CM | POA: Diagnosis not present

## 2023-01-09 DIAGNOSIS — F332 Major depressive disorder, recurrent severe without psychotic features: Secondary | ICD-10-CM | POA: Diagnosis not present

## 2023-01-14 DIAGNOSIS — M47812 Spondylosis without myelopathy or radiculopathy, cervical region: Secondary | ICD-10-CM | POA: Diagnosis not present

## 2023-01-14 DIAGNOSIS — M5481 Occipital neuralgia: Secondary | ICD-10-CM | POA: Diagnosis not present

## 2023-01-14 DIAGNOSIS — M542 Cervicalgia: Secondary | ICD-10-CM | POA: Diagnosis not present

## 2023-01-30 DIAGNOSIS — M5481 Occipital neuralgia: Secondary | ICD-10-CM | POA: Diagnosis not present

## 2023-01-30 DIAGNOSIS — M542 Cervicalgia: Secondary | ICD-10-CM | POA: Diagnosis not present

## 2023-02-20 DIAGNOSIS — M47812 Spondylosis without myelopathy or radiculopathy, cervical region: Secondary | ICD-10-CM | POA: Diagnosis not present

## 2023-02-20 DIAGNOSIS — M5481 Occipital neuralgia: Secondary | ICD-10-CM | POA: Diagnosis not present

## 2023-02-20 DIAGNOSIS — M542 Cervicalgia: Secondary | ICD-10-CM | POA: Diagnosis not present

## 2023-02-27 DIAGNOSIS — F332 Major depressive disorder, recurrent severe without psychotic features: Secondary | ICD-10-CM | POA: Diagnosis not present

## 2023-03-03 DIAGNOSIS — M0638 Rheumatoid nodule, vertebrae: Secondary | ICD-10-CM | POA: Diagnosis not present

## 2023-03-03 DIAGNOSIS — M4312 Spondylolisthesis, cervical region: Secondary | ICD-10-CM | POA: Diagnosis not present

## 2023-03-03 DIAGNOSIS — M47812 Spondylosis without myelopathy or radiculopathy, cervical region: Secondary | ICD-10-CM | POA: Diagnosis not present

## 2023-03-03 DIAGNOSIS — R636 Underweight: Secondary | ICD-10-CM | POA: Diagnosis not present

## 2023-03-03 DIAGNOSIS — M5481 Occipital neuralgia: Secondary | ICD-10-CM | POA: Diagnosis not present

## 2023-03-08 NOTE — Telephone Encounter (Signed)
error 

## 2023-03-12 DIAGNOSIS — M0638 Rheumatoid nodule, vertebrae: Secondary | ICD-10-CM | POA: Diagnosis not present

## 2023-03-12 DIAGNOSIS — M5412 Radiculopathy, cervical region: Secondary | ICD-10-CM | POA: Diagnosis not present

## 2023-03-12 DIAGNOSIS — Z5181 Encounter for therapeutic drug level monitoring: Secondary | ICD-10-CM | POA: Diagnosis not present

## 2023-03-12 DIAGNOSIS — M47812 Spondylosis without myelopathy or radiculopathy, cervical region: Secondary | ICD-10-CM | POA: Diagnosis not present

## 2023-03-12 DIAGNOSIS — Z79899 Other long term (current) drug therapy: Secondary | ICD-10-CM | POA: Diagnosis not present

## 2023-03-27 DIAGNOSIS — M47812 Spondylosis without myelopathy or radiculopathy, cervical region: Secondary | ICD-10-CM | POA: Diagnosis not present

## 2023-04-10 DIAGNOSIS — M47812 Spondylosis without myelopathy or radiculopathy, cervical region: Secondary | ICD-10-CM | POA: Diagnosis not present

## 2023-04-21 DIAGNOSIS — M5412 Radiculopathy, cervical region: Secondary | ICD-10-CM | POA: Diagnosis not present

## 2023-04-21 DIAGNOSIS — M542 Cervicalgia: Secondary | ICD-10-CM | POA: Diagnosis not present

## 2023-04-21 DIAGNOSIS — M5481 Occipital neuralgia: Secondary | ICD-10-CM | POA: Diagnosis not present

## 2023-04-21 DIAGNOSIS — M0638 Rheumatoid nodule, vertebrae: Secondary | ICD-10-CM | POA: Diagnosis not present

## 2023-04-21 DIAGNOSIS — M47812 Spondylosis without myelopathy or radiculopathy, cervical region: Secondary | ICD-10-CM | POA: Diagnosis not present

## 2023-04-23 DIAGNOSIS — F332 Major depressive disorder, recurrent severe without psychotic features: Secondary | ICD-10-CM | POA: Diagnosis not present

## 2023-05-13 ENCOUNTER — Other Ambulatory Visit: Payer: Self-pay | Admitting: Family Medicine

## 2023-05-13 DIAGNOSIS — Z0000 Encounter for general adult medical examination without abnormal findings: Secondary | ICD-10-CM

## 2023-05-14 ENCOUNTER — Ambulatory Visit
Admission: RE | Admit: 2023-05-14 | Discharge: 2023-05-14 | Disposition: A | Discharge status: Home / Self Care | Payer: Medicare HMO | Source: Ambulatory Visit | Attending: Family Medicine | Admitting: Family Medicine

## 2023-05-14 DIAGNOSIS — Z0000 Encounter for general adult medical examination without abnormal findings: Secondary | ICD-10-CM

## 2023-05-14 DIAGNOSIS — Z1231 Encounter for screening mammogram for malignant neoplasm of breast: Secondary | ICD-10-CM | POA: Diagnosis not present

## 2023-05-20 DIAGNOSIS — M5412 Radiculopathy, cervical region: Secondary | ICD-10-CM | POA: Diagnosis not present

## 2023-05-22 DIAGNOSIS — F332 Major depressive disorder, recurrent severe without psychotic features: Secondary | ICD-10-CM | POA: Diagnosis not present

## 2023-06-12 DIAGNOSIS — M5412 Radiculopathy, cervical region: Secondary | ICD-10-CM | POA: Diagnosis not present

## 2023-06-18 DIAGNOSIS — F332 Major depressive disorder, recurrent severe without psychotic features: Secondary | ICD-10-CM | POA: Diagnosis not present

## 2023-08-04 DIAGNOSIS — G9050 Complex regional pain syndrome I, unspecified: Secondary | ICD-10-CM | POA: Diagnosis not present

## 2023-08-04 DIAGNOSIS — M5412 Radiculopathy, cervical region: Secondary | ICD-10-CM | POA: Diagnosis not present

## 2023-08-04 DIAGNOSIS — G894 Chronic pain syndrome: Secondary | ICD-10-CM | POA: Diagnosis not present

## 2023-08-14 DIAGNOSIS — F332 Major depressive disorder, recurrent severe without psychotic features: Secondary | ICD-10-CM | POA: Diagnosis not present

## 2023-08-27 DIAGNOSIS — R413 Other amnesia: Secondary | ICD-10-CM | POA: Diagnosis not present

## 2023-08-27 DIAGNOSIS — Z0000 Encounter for general adult medical examination without abnormal findings: Secondary | ICD-10-CM | POA: Diagnosis not present

## 2023-08-27 DIAGNOSIS — E7800 Pure hypercholesterolemia, unspecified: Secondary | ICD-10-CM | POA: Diagnosis not present

## 2023-08-27 DIAGNOSIS — F321 Major depressive disorder, single episode, moderate: Secondary | ICD-10-CM | POA: Diagnosis not present

## 2023-08-27 DIAGNOSIS — F03A3 Unspecified dementia, mild, with mood disturbance: Secondary | ICD-10-CM | POA: Diagnosis not present

## 2023-08-27 DIAGNOSIS — Z1331 Encounter for screening for depression: Secondary | ICD-10-CM | POA: Diagnosis not present

## 2023-08-27 DIAGNOSIS — Z13820 Encounter for screening for osteoporosis: Secondary | ICD-10-CM | POA: Diagnosis not present

## 2023-08-28 ENCOUNTER — Other Ambulatory Visit: Payer: Self-pay | Admitting: Family Medicine

## 2023-08-28 DIAGNOSIS — E2839 Other primary ovarian failure: Secondary | ICD-10-CM

## 2023-08-28 DIAGNOSIS — Z13820 Encounter for screening for osteoporosis: Secondary | ICD-10-CM

## 2023-09-01 ENCOUNTER — Ambulatory Visit
Admission: RE | Admit: 2023-09-01 | Discharge: 2023-09-01 | Disposition: A | Discharge status: Home / Self Care | Payer: Medicare HMO | Source: Ambulatory Visit | Attending: Family Medicine | Admitting: Family Medicine

## 2023-09-01 DIAGNOSIS — E2839 Other primary ovarian failure: Secondary | ICD-10-CM

## 2023-09-01 DIAGNOSIS — Z13820 Encounter for screening for osteoporosis: Secondary | ICD-10-CM

## 2023-09-01 DIAGNOSIS — N958 Other specified menopausal and perimenopausal disorders: Secondary | ICD-10-CM | POA: Diagnosis not present

## 2023-09-01 DIAGNOSIS — M8588 Other specified disorders of bone density and structure, other site: Secondary | ICD-10-CM | POA: Diagnosis not present

## 2023-09-09 DIAGNOSIS — M810 Age-related osteoporosis without current pathological fracture: Secondary | ICD-10-CM | POA: Diagnosis not present

## 2023-09-22 DIAGNOSIS — M0638 Rheumatoid nodule, vertebrae: Secondary | ICD-10-CM | POA: Diagnosis not present

## 2023-09-22 DIAGNOSIS — M5412 Radiculopathy, cervical region: Secondary | ICD-10-CM | POA: Diagnosis not present

## 2023-09-22 DIAGNOSIS — M47812 Spondylosis without myelopathy or radiculopathy, cervical region: Secondary | ICD-10-CM | POA: Diagnosis not present

## 2023-09-22 DIAGNOSIS — M5481 Occipital neuralgia: Secondary | ICD-10-CM | POA: Diagnosis not present

## 2023-11-06 DIAGNOSIS — F332 Major depressive disorder, recurrent severe without psychotic features: Secondary | ICD-10-CM | POA: Diagnosis not present

## 2023-11-24 DIAGNOSIS — H2513 Age-related nuclear cataract, bilateral: Secondary | ICD-10-CM | POA: Diagnosis not present

## 2023-12-03 DIAGNOSIS — Z0100 Encounter for examination of eyes and vision without abnormal findings: Secondary | ICD-10-CM | POA: Diagnosis not present

## 2023-12-22 DIAGNOSIS — M47812 Spondylosis without myelopathy or radiculopathy, cervical region: Secondary | ICD-10-CM | POA: Diagnosis not present

## 2023-12-22 DIAGNOSIS — M5481 Occipital neuralgia: Secondary | ICD-10-CM | POA: Diagnosis not present

## 2023-12-22 DIAGNOSIS — M542 Cervicalgia: Secondary | ICD-10-CM | POA: Diagnosis not present

## 2023-12-22 DIAGNOSIS — M5412 Radiculopathy, cervical region: Secondary | ICD-10-CM | POA: Diagnosis not present

## 2023-12-22 DIAGNOSIS — M0638 Rheumatoid nodule, vertebrae: Secondary | ICD-10-CM | POA: Diagnosis not present

## 2024-01-19 DIAGNOSIS — M5412 Radiculopathy, cervical region: Secondary | ICD-10-CM | POA: Diagnosis not present

## 2024-01-19 DIAGNOSIS — M5481 Occipital neuralgia: Secondary | ICD-10-CM | POA: Diagnosis not present

## 2024-01-19 DIAGNOSIS — M47812 Spondylosis without myelopathy or radiculopathy, cervical region: Secondary | ICD-10-CM | POA: Diagnosis not present

## 2024-01-19 DIAGNOSIS — M0638 Rheumatoid nodule, vertebrae: Secondary | ICD-10-CM | POA: Diagnosis not present

## 2024-01-19 DIAGNOSIS — M542 Cervicalgia: Secondary | ICD-10-CM | POA: Diagnosis not present

## 2024-01-29 DIAGNOSIS — F332 Major depressive disorder, recurrent severe without psychotic features: Secondary | ICD-10-CM | POA: Diagnosis not present

## 2024-02-09 DIAGNOSIS — F321 Major depressive disorder, single episode, moderate: Secondary | ICD-10-CM | POA: Diagnosis not present

## 2024-02-09 DIAGNOSIS — M810 Age-related osteoporosis without current pathological fracture: Secondary | ICD-10-CM | POA: Diagnosis not present

## 2024-02-09 DIAGNOSIS — E7800 Pure hypercholesterolemia, unspecified: Secondary | ICD-10-CM | POA: Diagnosis not present

## 2024-02-14 DIAGNOSIS — F03A0 Unspecified dementia, mild, without behavioral disturbance, psychotic disturbance, mood disturbance, and anxiety: Secondary | ICD-10-CM | POA: Diagnosis not present

## 2024-02-14 DIAGNOSIS — G8929 Other chronic pain: Secondary | ICD-10-CM | POA: Diagnosis not present

## 2024-02-14 DIAGNOSIS — M542 Cervicalgia: Secondary | ICD-10-CM | POA: Diagnosis not present

## 2024-02-20 DIAGNOSIS — Z1330 Encounter for screening examination for mental health and behavioral disorders, unspecified: Secondary | ICD-10-CM | POA: Diagnosis not present

## 2024-02-20 DIAGNOSIS — M5481 Occipital neuralgia: Secondary | ICD-10-CM | POA: Diagnosis not present

## 2024-02-20 DIAGNOSIS — M47812 Spondylosis without myelopathy or radiculopathy, cervical region: Secondary | ICD-10-CM | POA: Diagnosis not present

## 2024-02-20 DIAGNOSIS — M4312 Spondylolisthesis, cervical region: Secondary | ICD-10-CM | POA: Diagnosis not present

## 2024-02-20 DIAGNOSIS — M0638 Rheumatoid nodule, vertebrae: Secondary | ICD-10-CM | POA: Diagnosis not present

## 2024-03-11 DIAGNOSIS — M47812 Spondylosis without myelopathy or radiculopathy, cervical region: Secondary | ICD-10-CM | POA: Diagnosis not present

## 2024-03-11 DIAGNOSIS — E7800 Pure hypercholesterolemia, unspecified: Secondary | ICD-10-CM | POA: Diagnosis not present

## 2024-03-11 DIAGNOSIS — F321 Major depressive disorder, single episode, moderate: Secondary | ICD-10-CM | POA: Diagnosis not present

## 2024-03-11 DIAGNOSIS — M810 Age-related osteoporosis without current pathological fracture: Secondary | ICD-10-CM | POA: Diagnosis not present

## 2024-03-11 DIAGNOSIS — M4312 Spondylolisthesis, cervical region: Secondary | ICD-10-CM | POA: Diagnosis not present

## 2024-03-11 DIAGNOSIS — M438X2 Other specified deforming dorsopathies, cervical region: Secondary | ICD-10-CM | POA: Diagnosis not present

## 2024-03-17 DIAGNOSIS — Z79899 Other long term (current) drug therapy: Secondary | ICD-10-CM | POA: Diagnosis not present

## 2024-03-29 DIAGNOSIS — R636 Underweight: Secondary | ICD-10-CM | POA: Diagnosis not present

## 2024-03-29 DIAGNOSIS — M0638 Rheumatoid nodule, vertebrae: Secondary | ICD-10-CM | POA: Diagnosis not present

## 2024-03-29 DIAGNOSIS — M4312 Spondylolisthesis, cervical region: Secondary | ICD-10-CM | POA: Diagnosis not present

## 2024-03-29 DIAGNOSIS — M5481 Occipital neuralgia: Secondary | ICD-10-CM | POA: Diagnosis not present

## 2024-03-29 DIAGNOSIS — M47812 Spondylosis without myelopathy or radiculopathy, cervical region: Secondary | ICD-10-CM | POA: Diagnosis not present

## 2024-03-30 DIAGNOSIS — D649 Anemia, unspecified: Secondary | ICD-10-CM | POA: Diagnosis not present

## 2024-04-03 ENCOUNTER — Other Ambulatory Visit: Payer: Self-pay | Admitting: Medical Genetics

## 2024-04-11 DIAGNOSIS — E7800 Pure hypercholesterolemia, unspecified: Secondary | ICD-10-CM | POA: Diagnosis not present

## 2024-04-11 DIAGNOSIS — M810 Age-related osteoporosis without current pathological fracture: Secondary | ICD-10-CM | POA: Diagnosis not present

## 2024-04-11 DIAGNOSIS — F321 Major depressive disorder, single episode, moderate: Secondary | ICD-10-CM | POA: Diagnosis not present

## 2024-04-21 DIAGNOSIS — F332 Major depressive disorder, recurrent severe without psychotic features: Secondary | ICD-10-CM | POA: Diagnosis not present

## 2024-05-13 DIAGNOSIS — H9222 Otorrhagia, left ear: Secondary | ICD-10-CM | POA: Diagnosis not present

## 2024-05-13 DIAGNOSIS — S0991XA Unspecified injury of ear, initial encounter: Secondary | ICD-10-CM | POA: Diagnosis not present

## 2024-05-18 DIAGNOSIS — S0991XD Unspecified injury of ear, subsequent encounter: Secondary | ICD-10-CM | POA: Diagnosis not present

## 2024-05-20 ENCOUNTER — Other Ambulatory Visit

## 2024-05-20 DIAGNOSIS — Z006 Encounter for examination for normal comparison and control in clinical research program: Secondary | ICD-10-CM

## 2024-05-25 DIAGNOSIS — D649 Anemia, unspecified: Secondary | ICD-10-CM | POA: Diagnosis not present

## 2024-05-29 LAB — GENECONNECT MOLECULAR SCREEN: Genetic Analysis Overall Interpretation: NEGATIVE

## 2024-06-14 DIAGNOSIS — M25552 Pain in left hip: Secondary | ICD-10-CM | POA: Diagnosis not present

## 2024-06-14 DIAGNOSIS — M7062 Trochanteric bursitis, left hip: Secondary | ICD-10-CM | POA: Diagnosis not present

## 2024-07-14 DIAGNOSIS — F332 Major depressive disorder, recurrent severe without psychotic features: Secondary | ICD-10-CM | POA: Diagnosis not present

## 2024-08-20 ENCOUNTER — Other Ambulatory Visit (HOSPITAL_COMMUNITY): Payer: Self-pay | Admitting: Emergency Medicine

## 2024-08-20 DIAGNOSIS — G3184 Mild cognitive impairment, so stated: Secondary | ICD-10-CM

## 2024-08-24 ENCOUNTER — Encounter (HOSPITAL_COMMUNITY)
Admission: RE | Admit: 2024-08-24 | Discharge: 2024-08-24 | Disposition: A | Discharge status: Home / Self Care | Payer: Self-pay | Source: Ambulatory Visit | Attending: Emergency Medicine | Admitting: Emergency Medicine

## 2024-08-24 DIAGNOSIS — G3184 Mild cognitive impairment, so stated: Secondary | ICD-10-CM | POA: Diagnosis present

## 2024-08-24 MED ORDER — FLORBETAPIR F 18 500-1900 MBQ/ML IV SOLN
9.2300 | Freq: Once | INTRAVENOUS | Status: AC
  Administered 2024-08-24: 9.23 via INTRAVENOUS

## 2024-08-25 ENCOUNTER — Encounter (HOSPITAL_COMMUNITY): Payer: Self-pay | Admitting: Emergency Medicine

## 2024-08-25 DIAGNOSIS — G3184 Mild cognitive impairment, so stated: Secondary | ICD-10-CM
